# Patient Record
Sex: Female | Born: 1950
Health system: Southern US, Community
[De-identification: ages and names within clinical notes are randomized; demographics above are authoritative.]

## PROBLEM LIST (undated history)

## (undated) ENCOUNTER — Emergency Department (HOSPITAL_COMMUNITY): Admission: EM | Payer: Medicare Other | Source: Home / Self Care

## (undated) DIAGNOSIS — T7840XA Allergy, unspecified, initial encounter: Secondary | ICD-10-CM

## (undated) DIAGNOSIS — Z8719 Personal history of other diseases of the digestive system: Secondary | ICD-10-CM

## (undated) DIAGNOSIS — H269 Unspecified cataract: Secondary | ICD-10-CM

## (undated) DIAGNOSIS — E039 Hypothyroidism, unspecified: Secondary | ICD-10-CM

## (undated) DIAGNOSIS — F419 Anxiety disorder, unspecified: Secondary | ICD-10-CM

## (undated) DIAGNOSIS — F319 Bipolar disorder, unspecified: Secondary | ICD-10-CM

## (undated) DIAGNOSIS — G8929 Other chronic pain: Secondary | ICD-10-CM

## (undated) DIAGNOSIS — M549 Dorsalgia, unspecified: Secondary | ICD-10-CM

## (undated) HISTORY — DX: Unspecified cataract: H26.9

## (undated) HISTORY — PX: OTHER SURGICAL HISTORY: SHX169

## (undated) HISTORY — DX: Allergy, unspecified, initial encounter: T78.40XA

## (undated) HISTORY — PX: JOINT REPLACEMENT: SHX530

## (undated) HISTORY — PX: TONSILLECTOMY: SUR1361

---

## 2002-07-03 ENCOUNTER — Other Ambulatory Visit: Admission: RE | Admit: 2002-07-03 | Discharge: 2002-07-03 | Payer: Self-pay | Admitting: Obstetrics and Gynecology

## 2002-08-30 ENCOUNTER — Emergency Department (HOSPITAL_COMMUNITY): Admission: EM | Admit: 2002-08-30 | Discharge: 2002-08-30 | Payer: Self-pay | Admitting: *Deleted

## 2002-08-30 ENCOUNTER — Encounter: Payer: Self-pay | Admitting: Emergency Medicine

## 2002-11-21 ENCOUNTER — Ambulatory Visit (HOSPITAL_COMMUNITY): Admission: RE | Admit: 2002-11-21 | Discharge: 2002-11-21 | Payer: Self-pay | Admitting: Internal Medicine

## 2002-11-21 ENCOUNTER — Encounter: Payer: Self-pay | Admitting: Internal Medicine

## 2003-01-09 ENCOUNTER — Emergency Department (HOSPITAL_COMMUNITY): Admission: EM | Admit: 2003-01-09 | Discharge: 2003-01-10 | Payer: Self-pay | Admitting: *Deleted

## 2003-01-17 ENCOUNTER — Inpatient Hospital Stay (HOSPITAL_COMMUNITY): Admission: EM | Admit: 2003-01-17 | Discharge: 2003-01-25 | Payer: Self-pay | Admitting: Psychiatry

## 2003-02-03 ENCOUNTER — Emergency Department (HOSPITAL_COMMUNITY): Admission: EM | Admit: 2003-02-03 | Discharge: 2003-02-03 | Payer: Self-pay | Admitting: Emergency Medicine

## 2003-04-05 ENCOUNTER — Encounter (INDEPENDENT_AMBULATORY_CARE_PROVIDER_SITE_OTHER): Payer: Self-pay | Admitting: Specialist

## 2003-04-05 ENCOUNTER — Ambulatory Visit (HOSPITAL_COMMUNITY): Admission: RE | Admit: 2003-04-05 | Discharge: 2003-04-05 | Payer: Self-pay | Admitting: Gastroenterology

## 2003-04-08 ENCOUNTER — Encounter: Admission: RE | Admit: 2003-04-08 | Discharge: 2003-04-08 | Payer: Self-pay | Admitting: Internal Medicine

## 2003-04-08 ENCOUNTER — Encounter: Payer: Self-pay | Admitting: Internal Medicine

## 2003-07-05 ENCOUNTER — Other Ambulatory Visit: Admission: RE | Admit: 2003-07-05 | Discharge: 2003-07-05 | Payer: Self-pay | Admitting: Obstetrics and Gynecology

## 2003-12-04 ENCOUNTER — Encounter: Admission: RE | Admit: 2003-12-04 | Discharge: 2003-12-04 | Payer: Self-pay | Admitting: Internal Medicine

## 2003-12-05 ENCOUNTER — Emergency Department (HOSPITAL_COMMUNITY): Admission: EM | Admit: 2003-12-05 | Discharge: 2003-12-05 | Payer: Self-pay | Admitting: Emergency Medicine

## 2004-07-07 ENCOUNTER — Other Ambulatory Visit: Admission: RE | Admit: 2004-07-07 | Discharge: 2004-07-07 | Payer: Self-pay | Admitting: Obstetrics and Gynecology

## 2004-07-10 ENCOUNTER — Ambulatory Visit (HOSPITAL_COMMUNITY): Admission: RE | Admit: 2004-07-10 | Discharge: 2004-07-10 | Payer: Self-pay | Admitting: Obstetrics and Gynecology

## 2005-01-20 ENCOUNTER — Ambulatory Visit (HOSPITAL_BASED_OUTPATIENT_CLINIC_OR_DEPARTMENT_OTHER): Admission: RE | Admit: 2005-01-20 | Discharge: 2005-01-20 | Payer: Self-pay | Admitting: Orthopedic Surgery

## 2005-01-20 ENCOUNTER — Ambulatory Visit (HOSPITAL_COMMUNITY): Admission: RE | Admit: 2005-01-20 | Discharge: 2005-01-20 | Payer: Self-pay | Admitting: Orthopedic Surgery

## 2005-02-18 ENCOUNTER — Encounter: Admission: RE | Admit: 2005-02-18 | Discharge: 2005-02-18 | Payer: Self-pay | Admitting: Internal Medicine

## 2005-07-09 ENCOUNTER — Other Ambulatory Visit: Admission: RE | Admit: 2005-07-09 | Discharge: 2005-07-09 | Payer: Self-pay | Admitting: Obstetrics and Gynecology

## 2005-07-20 ENCOUNTER — Ambulatory Visit (HOSPITAL_COMMUNITY): Admission: RE | Admit: 2005-07-20 | Discharge: 2005-07-20 | Payer: Self-pay | Admitting: Obstetrics and Gynecology

## 2006-04-20 ENCOUNTER — Emergency Department (HOSPITAL_COMMUNITY): Admission: EM | Admit: 2006-04-20 | Discharge: 2006-04-20 | Payer: Self-pay | Admitting: Emergency Medicine

## 2006-06-20 ENCOUNTER — Inpatient Hospital Stay (HOSPITAL_COMMUNITY): Admission: RE | Admit: 2006-06-20 | Discharge: 2006-06-25 | Payer: Self-pay | Admitting: Orthopedic Surgery

## 2006-06-21 ENCOUNTER — Ambulatory Visit: Payer: Self-pay | Admitting: Physical Medicine & Rehabilitation

## 2006-08-24 ENCOUNTER — Other Ambulatory Visit: Admission: RE | Admit: 2006-08-24 | Discharge: 2006-08-24 | Payer: Self-pay | Admitting: Obstetrics and Gynecology

## 2006-09-02 ENCOUNTER — Ambulatory Visit (HOSPITAL_COMMUNITY): Admission: RE | Admit: 2006-09-02 | Discharge: 2006-09-02 | Payer: Self-pay | Admitting: Obstetrics and Gynecology

## 2007-06-28 ENCOUNTER — Inpatient Hospital Stay (HOSPITAL_COMMUNITY): Admission: RE | Admit: 2007-06-28 | Discharge: 2007-07-02 | Payer: Self-pay | Admitting: Orthopedic Surgery

## 2007-07-28 ENCOUNTER — Ambulatory Visit (HOSPITAL_COMMUNITY): Admission: RE | Admit: 2007-07-28 | Discharge: 2007-07-28 | Payer: Self-pay | Admitting: Orthopedic Surgery

## 2007-07-28 ENCOUNTER — Ambulatory Visit: Payer: Self-pay | Admitting: Vascular Surgery

## 2007-07-28 ENCOUNTER — Encounter (INDEPENDENT_AMBULATORY_CARE_PROVIDER_SITE_OTHER): Payer: Self-pay | Admitting: Orthopedic Surgery

## 2007-11-07 ENCOUNTER — Ambulatory Visit (HOSPITAL_COMMUNITY): Admission: RE | Admit: 2007-11-07 | Discharge: 2007-11-07 | Payer: Self-pay | Admitting: Obstetrics and Gynecology

## 2008-08-17 ENCOUNTER — Encounter: Admission: RE | Admit: 2008-08-17 | Discharge: 2008-08-17 | Payer: Self-pay | Admitting: Orthopedic Surgery

## 2008-10-28 ENCOUNTER — Inpatient Hospital Stay (HOSPITAL_COMMUNITY): Admission: AD | Admit: 2008-10-28 | Discharge: 2008-11-01 | Payer: Self-pay | Admitting: Orthopedic Surgery

## 2008-11-29 ENCOUNTER — Ambulatory Visit (HOSPITAL_COMMUNITY): Admission: RE | Admit: 2008-11-29 | Discharge: 2008-11-29 | Payer: Self-pay | Admitting: Internal Medicine

## 2009-03-20 ENCOUNTER — Ambulatory Visit (HOSPITAL_COMMUNITY): Admission: RE | Admit: 2009-03-20 | Discharge: 2009-03-20 | Payer: Self-pay | Admitting: Family Medicine

## 2009-03-26 ENCOUNTER — Emergency Department (HOSPITAL_COMMUNITY): Admission: EM | Admit: 2009-03-26 | Discharge: 2009-03-26 | Payer: Self-pay | Admitting: Emergency Medicine

## 2009-06-19 ENCOUNTER — Emergency Department (HOSPITAL_COMMUNITY): Admission: EM | Admit: 2009-06-19 | Discharge: 2009-06-19 | Payer: Self-pay | Admitting: Internal Medicine

## 2009-06-28 ENCOUNTER — Emergency Department (HOSPITAL_COMMUNITY): Admission: EM | Admit: 2009-06-28 | Discharge: 2009-06-28 | Payer: Self-pay | Admitting: Emergency Medicine

## 2009-07-01 ENCOUNTER — Other Ambulatory Visit: Payer: Self-pay | Admitting: Emergency Medicine

## 2009-07-02 ENCOUNTER — Ambulatory Visit: Payer: Self-pay | Admitting: Psychiatry

## 2009-07-02 ENCOUNTER — Other Ambulatory Visit: Payer: Self-pay | Admitting: Emergency Medicine

## 2009-07-02 ENCOUNTER — Inpatient Hospital Stay (HOSPITAL_COMMUNITY): Admission: AD | Admit: 2009-07-02 | Discharge: 2009-07-14 | Payer: Self-pay | Admitting: Psychiatry

## 2009-07-15 ENCOUNTER — Emergency Department (HOSPITAL_COMMUNITY): Admission: EM | Admit: 2009-07-15 | Discharge: 2009-07-15 | Payer: Self-pay | Admitting: Emergency Medicine

## 2009-09-08 ENCOUNTER — Encounter: Admission: RE | Admit: 2009-09-08 | Discharge: 2009-09-08 | Payer: Self-pay | Admitting: Internal Medicine

## 2009-11-11 ENCOUNTER — Ambulatory Visit (HOSPITAL_COMMUNITY): Admission: RE | Admit: 2009-11-11 | Discharge: 2009-11-11 | Payer: Self-pay | Admitting: Orthopedic Surgery

## 2010-02-09 ENCOUNTER — Ambulatory Visit (HOSPITAL_COMMUNITY): Admission: RE | Admit: 2010-02-09 | Discharge: 2010-02-09 | Payer: Self-pay | Admitting: Obstetrics and Gynecology

## 2010-07-26 ENCOUNTER — Emergency Department (HOSPITAL_COMMUNITY): Admission: EM | Admit: 2010-07-26 | Discharge: 2010-07-27 | Payer: Self-pay | Admitting: Emergency Medicine

## 2010-12-03 ENCOUNTER — Emergency Department (HOSPITAL_COMMUNITY)
Admission: EM | Admit: 2010-12-03 | Discharge: 2010-12-03 | Payer: Self-pay | Source: Home / Self Care | Admitting: Emergency Medicine

## 2010-12-07 LAB — URINALYSIS, ROUTINE W REFLEX MICROSCOPIC
Bilirubin Urine: NEGATIVE
Hgb urine dipstick: NEGATIVE
Ketones, ur: NEGATIVE mg/dL
Nitrite: NEGATIVE
Protein, ur: NEGATIVE mg/dL
Specific Gravity, Urine: 1.004 — ABNORMAL LOW (ref 1.005–1.030)
Urine Glucose, Fasting: NEGATIVE mg/dL
Urobilinogen, UA: 0.2 mg/dL (ref 0.0–1.0)
pH: 7.5 (ref 5.0–8.0)

## 2010-12-07 LAB — DIFFERENTIAL
Basophils Absolute: 0 10*3/uL (ref 0.0–0.1)
Basophils Relative: 0 % (ref 0–1)
Eosinophils Absolute: 0.2 10*3/uL (ref 0.0–0.7)
Eosinophils Relative: 3 % (ref 0–5)
Lymphocytes Relative: 19 % (ref 12–46)
Lymphs Abs: 1.3 10*3/uL (ref 0.7–4.0)
Monocytes Absolute: 0.7 10*3/uL (ref 0.1–1.0)
Monocytes Relative: 10 % (ref 3–12)
Neutro Abs: 4.7 10*3/uL (ref 1.7–7.7)
Neutrophils Relative %: 68 % (ref 43–77)

## 2010-12-07 LAB — CBC
HCT: 40.2 % (ref 36.0–46.0)
Hemoglobin: 12.8 g/dL (ref 12.0–15.0)
MCH: 26.9 pg (ref 26.0–34.0)
MCHC: 31.8 g/dL (ref 30.0–36.0)
MCV: 84.6 fL (ref 78.0–100.0)
Platelets: 324 10*3/uL (ref 150–400)
RBC: 4.75 MIL/uL (ref 3.87–5.11)
RDW: 14.9 % (ref 11.5–15.5)
WBC: 6.9 10*3/uL (ref 4.0–10.5)

## 2010-12-07 LAB — BASIC METABOLIC PANEL
BUN: 7 mg/dL (ref 6–23)
CO2: 29 mEq/L (ref 19–32)
Calcium: 10.2 mg/dL (ref 8.4–10.5)
Chloride: 101 mEq/L (ref 96–112)
Creatinine, Ser: 1.03 mg/dL (ref 0.4–1.2)
GFR calc Af Amer: >60 mL/min (ref >60)
GFR calc non Af Amer: 55 mL/min — ABNORMAL LOW (ref >60)
Glucose, Bld: 87 mg/dL (ref 70–99)
Potassium: 3.8 mEq/L (ref 3.5–5.1)
Sodium: 137 mEq/L (ref 135–145)

## 2010-12-13 ENCOUNTER — Encounter: Payer: Self-pay | Admitting: Internal Medicine

## 2010-12-13 ENCOUNTER — Encounter: Payer: Self-pay | Admitting: Family Medicine

## 2011-01-07 ENCOUNTER — Other Ambulatory Visit (HOSPITAL_COMMUNITY): Payer: Self-pay | Admitting: Obstetrics and Gynecology

## 2011-01-07 DIAGNOSIS — Z1231 Encounter for screening mammogram for malignant neoplasm of breast: Secondary | ICD-10-CM

## 2011-02-04 LAB — DIFFERENTIAL
Basophils Absolute: 0 10*3/uL (ref 0.0–0.1)
Basophils Relative: 0 % (ref 0–1)
Lymphocytes Relative: 21 % (ref 12–46)
Neutro Abs: 3.9 10*3/uL (ref 1.7–7.7)
Neutrophils Relative %: 65 % (ref 43–77)

## 2011-02-04 LAB — CBC
HCT: 38.5 % (ref 36.0–46.0)
MCH: 26.8 pg (ref 26.0–34.0)
MCV: 81.2 fL (ref 78.0–100.0)
Platelets: 283 10*3/uL (ref 150–400)
RBC: 4.74 MIL/uL (ref 3.87–5.11)

## 2011-02-04 LAB — COMPREHENSIVE METABOLIC PANEL
Albumin: 4.1 g/dL (ref 3.5–5.2)
BUN: 14 mg/dL (ref 6–23)
CO2: 28 mEq/L (ref 19–32)
Chloride: 100 mEq/L (ref 96–112)
Creatinine, Ser: 0.91 mg/dL (ref 0.4–1.2)
GFR calc non Af Amer: >60 mL/min (ref >60)
Glucose, Bld: 102 mg/dL — ABNORMAL HIGH (ref 70–99)
Total Bilirubin: 0.6 mg/dL (ref 0.3–1.2)

## 2011-02-04 LAB — URINALYSIS, ROUTINE W REFLEX MICROSCOPIC
Bilirubin Urine: NEGATIVE
Hgb urine dipstick: NEGATIVE
Specific Gravity, Urine: 1.006 (ref 1.005–1.030)
Urobilinogen, UA: 0.2 mg/dL (ref 0.0–1.0)

## 2011-02-11 ENCOUNTER — Ambulatory Visit (HOSPITAL_COMMUNITY): Payer: Medicare Other

## 2011-02-12 ENCOUNTER — Ambulatory Visit: Payer: Self-pay | Admitting: Physical Medicine & Rehabilitation

## 2011-02-18 ENCOUNTER — Ambulatory Visit (HOSPITAL_COMMUNITY): Payer: Medicare Other

## 2011-02-19 ENCOUNTER — Ambulatory Visit (HOSPITAL_COMMUNITY)
Admission: RE | Admit: 2011-02-19 | Discharge: 2011-02-19 | Disposition: A | Discharge status: Home / Self Care | Payer: Medicare Other | Source: Ambulatory Visit | Attending: Obstetrics and Gynecology | Admitting: Obstetrics and Gynecology

## 2011-02-19 DIAGNOSIS — Z1231 Encounter for screening mammogram for malignant neoplasm of breast: Secondary | ICD-10-CM

## 2011-02-27 LAB — BASIC METABOLIC PANEL
BUN: 10 mg/dL (ref 6–23)
CO2: 27 mEq/L (ref 19–32)
Calcium: 10.6 mg/dL — ABNORMAL HIGH (ref 8.4–10.5)
Chloride: 105 mEq/L (ref 96–112)
Creatinine, Ser: 0.96 mg/dL (ref 0.4–1.2)
GFR calc Af Amer: >60 mL/min (ref >60)

## 2011-02-27 LAB — CBC
HCT: 41.3 % (ref 36.0–46.0)
Hemoglobin: 13.5 g/dL (ref 12.0–15.0)
WBC: 5 10*3/uL (ref 4.0–10.5)

## 2011-02-27 LAB — DIFFERENTIAL
Basophils Absolute: 0 10*3/uL (ref 0.0–0.1)
Basophils Relative: 0 % (ref 0–1)
Eosinophils Absolute: 0.1 10*3/uL (ref 0.0–0.7)
Monocytes Absolute: 0.5 10*3/uL (ref 0.1–1.0)
Monocytes Relative: 11 % (ref 3–12)
Neutro Abs: 3.2 10*3/uL (ref 1.7–7.7)
Neutrophils Relative %: 63 % (ref 43–77)

## 2011-02-27 LAB — COMPREHENSIVE METABOLIC PANEL
ALT: 46 U/L — ABNORMAL HIGH (ref 0–35)
Albumin: 4.7 g/dL (ref 3.5–5.2)
Alkaline Phosphatase: 56 U/L (ref 39–117)
BUN: 4 mg/dL — ABNORMAL LOW (ref 6–23)
Chloride: 98 mEq/L (ref 96–112)
Glucose, Bld: 75 mg/dL (ref 70–99)
Potassium: 2.9 mEq/L — ABNORMAL LOW (ref 3.5–5.1)
Sodium: 132 mEq/L — ABNORMAL LOW (ref 135–145)
Total Bilirubin: 0.8 mg/dL (ref 0.3–1.2)

## 2011-02-27 LAB — TSH: TSH: 99.784 u[IU]/mL — ABNORMAL HIGH (ref 0.350–4.500)

## 2011-02-28 LAB — RAPID URINE DRUG SCREEN, HOSP PERFORMED
Amphetamines: NOT DETECTED
Barbiturates: NOT DETECTED
Benzodiazepines: NOT DETECTED
Cocaine: NOT DETECTED
Opiates: NOT DETECTED
Tetrahydrocannabinol: NOT DETECTED

## 2011-02-28 LAB — BASIC METABOLIC PANEL
BUN: 8 mg/dL (ref 6–23)
CO2: 26 mEq/L (ref 19–32)
Calcium: 10.9 mg/dL — ABNORMAL HIGH (ref 8.4–10.5)
Chloride: 105 mEq/L (ref 96–112)
Creatinine, Ser: 0.83 mg/dL (ref 0.4–1.2)
GFR calc Af Amer: >60 mL/min (ref >60)
Glucose, Bld: 83 mg/dL (ref 70–99)

## 2011-02-28 LAB — URINALYSIS, ROUTINE W REFLEX MICROSCOPIC
Bilirubin Urine: NEGATIVE
Glucose, UA: NEGATIVE mg/dL
Hgb urine dipstick: NEGATIVE
Specific Gravity, Urine: 1.006 (ref 1.005–1.030)
pH: 7 (ref 5.0–8.0)

## 2011-02-28 LAB — CBC
MCHC: 32.3 g/dL (ref 30.0–36.0)
MCV: 80.9 fL (ref 78.0–100.0)
RBC: 4.94 MIL/uL (ref 3.87–5.11)
RDW: 15.8 % — ABNORMAL HIGH (ref 11.5–15.5)

## 2011-02-28 LAB — WET PREP, GENITAL

## 2011-02-28 LAB — LITHIUM LEVEL: Lithium Lvl: 1.25 mEq/L (ref 0.80–1.40)

## 2011-03-03 ENCOUNTER — Encounter: Payer: Medicare Other | Attending: Physical Medicine & Rehabilitation | Admitting: Physical Medicine & Rehabilitation

## 2011-03-03 DIAGNOSIS — M1710 Unilateral primary osteoarthritis, unspecified knee: Secondary | ICD-10-CM

## 2011-03-03 DIAGNOSIS — E669 Obesity, unspecified: Secondary | ICD-10-CM

## 2011-03-03 DIAGNOSIS — Z79899 Other long term (current) drug therapy: Secondary | ICD-10-CM | POA: Insufficient documentation

## 2011-03-03 DIAGNOSIS — Z96659 Presence of unspecified artificial knee joint: Secondary | ICD-10-CM | POA: Insufficient documentation

## 2011-03-03 DIAGNOSIS — M25569 Pain in unspecified knee: Secondary | ICD-10-CM | POA: Insufficient documentation

## 2011-03-03 DIAGNOSIS — F319 Bipolar disorder, unspecified: Secondary | ICD-10-CM | POA: Insufficient documentation

## 2011-03-03 DIAGNOSIS — E039 Hypothyroidism, unspecified: Secondary | ICD-10-CM | POA: Insufficient documentation

## 2011-03-16 ENCOUNTER — Encounter: Payer: Medicare Other | Attending: Neurosurgery | Admitting: Neurosurgery

## 2011-03-16 DIAGNOSIS — Z96659 Presence of unspecified artificial knee joint: Secondary | ICD-10-CM | POA: Insufficient documentation

## 2011-03-16 DIAGNOSIS — Z79899 Other long term (current) drug therapy: Secondary | ICD-10-CM | POA: Insufficient documentation

## 2011-03-16 DIAGNOSIS — M25569 Pain in unspecified knee: Secondary | ICD-10-CM

## 2011-03-16 DIAGNOSIS — F319 Bipolar disorder, unspecified: Secondary | ICD-10-CM | POA: Insufficient documentation

## 2011-03-16 DIAGNOSIS — E039 Hypothyroidism, unspecified: Secondary | ICD-10-CM | POA: Insufficient documentation

## 2011-03-16 DIAGNOSIS — M1990 Unspecified osteoarthritis, unspecified site: Secondary | ICD-10-CM | POA: Insufficient documentation

## 2011-03-16 DIAGNOSIS — M1710 Unilateral primary osteoarthritis, unspecified knee: Secondary | ICD-10-CM

## 2011-03-16 DIAGNOSIS — G8929 Other chronic pain: Secondary | ICD-10-CM | POA: Insufficient documentation

## 2011-03-16 NOTE — Assessment & Plan Note (Signed)
This is a patient who was seen as a new patient by Dr. Riley Kill last month.  She had extensive amount of knee pain.  She has got a history of bilateral knee replacements.  She is followed by Dr. Dorothyann Peng.  She also has a history of bipolar disorder treated with Zyprexa, lithium, and Klonopin, and she comes in today stating she has mixed up her lithium with her Mobic, and I had show how to separate those.  I have encouraged her not to mix her medicines.  She states she do not know why she did that.  She has trouble from a sitting position to standing.  She does use a rolling walker.  She states that her legs feel very stiff or sore with ambulation.  PAST MEDICAL HISTORY:  Bipolar osteoarthritis, hypothyroidism.  REVIEW OF SYSTEMS:  Notable for those difficulties described in history of present illness, past medical history.  Review of systems is otherwise unremarkable.  SOCIAL HISTORY:  Unchanged  FAMILY HISTORY:  Unchanged.  MEDICATIONS: 1. Synthroid 88 mcg daily. 2. Zyprexa 10 daily. 3. Pravachol 20 daily. 4. Baclofen 10 daily. 5. We have added Mobic 15 mg a day, which she states is not helping     her lot. 6. I have started her on tramadol a day 50 mg 1 p.o. b.i.d.  PHYSICAL EXAMINATION:  Blood pressure 104/66, pulse 73, respirations 18, O2 sats 95 on room air.  Motor strength is 5/5 in lower extremities. Sensation appears to be intact in lower extremities, although she has decreased range of motion.  She is alert and oriented x3.  However, she does slur her speech somewhat.  She appears little bit depressed. Constitutionally, she appears within normal limits.  She is obese.  ASSESSMENT:  History of bipolar disorder with bilateral osteoarthritis of knees with status post knee arthroplasties and chronic knee pain.  PLAN:  We will continue her on her Mobic, start her on tramadol 50 mg 1 p.o. b.i.d.  We will order some labs for her, TSH, BMET, and a lithium level.  She  will follow up with Dr. Riley Kill in 2-3 weeks, start on physical therapy 2-3 times a week for range of motion in her lower extremities.  She is in agreement with this.     [facility]    RLW/MedQ D:  03/16/2011 10:20:10  T:  03/16/2011 22:48:50  Job #:  [id number]

## 2011-04-01 ENCOUNTER — Ambulatory Visit: Payer: Medicare Other | Admitting: Neurosurgery

## 2011-04-06 ENCOUNTER — Encounter: Payer: Medicare Other | Attending: Neurosurgery | Admitting: Neurosurgery

## 2011-04-06 DIAGNOSIS — Z79899 Other long term (current) drug therapy: Secondary | ICD-10-CM | POA: Insufficient documentation

## 2011-04-06 DIAGNOSIS — G8929 Other chronic pain: Secondary | ICD-10-CM | POA: Insufficient documentation

## 2011-04-06 DIAGNOSIS — M1710 Unilateral primary osteoarthritis, unspecified knee: Secondary | ICD-10-CM

## 2011-04-06 DIAGNOSIS — Z96659 Presence of unspecified artificial knee joint: Secondary | ICD-10-CM | POA: Insufficient documentation

## 2011-04-06 DIAGNOSIS — M25569 Pain in unspecified knee: Secondary | ICD-10-CM

## 2011-04-06 DIAGNOSIS — F319 Bipolar disorder, unspecified: Secondary | ICD-10-CM | POA: Insufficient documentation

## 2011-04-06 NOTE — H&P (Signed)
Brenda [facility], [facility] NO.:  [id number]   MEDICAL RECORD NO.:  [id number]          PATIENT TYPE:  IPS   LOCATION:  0406                          [facility]ACILITY[facility]  [facility]   PHYSICIAN:  Anselm Jungling, MD  DATE OF BIRTH:  1951-09-27   DATE OF ADMISSION:  07/02/2009  DATE OF DISCHARGE:                       PSYCHIATRIC ADMISSION ASSESSMENT   DATE OF ASSESSMENT:  July 02, 2009 at 11:45 a.m.   IDENTIFYING INFORMATION:  A 60 year old female.  This is an involuntary  admission.   HISTORY OF PRESENT ILLNESS:  This is the second [facility] admission for this  60 year old who presented in the emergency room accompanied by her  family after several days of confusion.  Family complaining that she is  not herself, wandering at night, unable to sleep, calling them at all  hours.  Generally not herself.  Her initial lithium level was noted to  be 1.98, and on repeat was 1.25.  Electrolytes and kidney function were  normal.  She was in no physical distress.  She had presented previously  in the emergency room on June 28, 2009, also for altered mental status  at that time, and was found to be hyponatremic and hypokalemic with a  potassium of 2.9.  Ammonia level at that time was normal, and lithium  level 1.07.  CT scan showed mild periventricular white matter changes  but no acute abnormalities.  Also seen in the emergency room on June 19, 2009, in full contact with reality but complaining of right leg pain  after knee replacements and was prescribed Percocet for the pain.  Today, she has a blunt affect, poor eye contact.  She is disheveled,  hair and clothing in disarray.  Knows who she is, knows the day of the  week, the month of the year and believes that she is on a rehab unit.  Displaying some thought blocking and complaining of a lot of voices  around her.  Expressing no suicidal or homicidal thoughts or any  dangerous ideas.  Her son reported that she had a period within the past  month of 2 weeks when she was without any of her medications because she  had missed an earlier appointment with [facility], and prior to  admission had been back on her medications for 4-5 days.   PAST PSYCHIATRIC HISTORY:  She has a history of bipolar disorder and is  followed as an outpatient [facility].  Previous [facility] admission January 17, 2003 to January 25, 2003,  treated for agitation which had gotten worse after starting on  Wellbutrin.  Discharged at that time on lithium carbonate 300 mg t.i.d.,  Klonopin 0.5 mg twice a day, Zyprexa Zydis 20 mg at night and Wellbutrin  was discontinued.  She has a history of long periods of stability in her  bipolar disorder.   SOCIAL HISTORY:  A single female who lives alone in her own residence,  previously able to live independently and tend to her own ADLs.  Family  is nearby and is supportive.   FAMILY HISTORY:  Not  available.   ALCOHOL AND DRUG HISTORY:  No known history of substance abuse.   MEDICAL HISTORY:  Primary care [facility] is not available.   MEDICAL PROBLEMS:  1. Arthritis NOS.  2. She is status post bilateral total knee replacement.  3. Hypokalemia, repleted.  4. Transient lithium toxicity.  5. Mild elevated transaminases.   CURRENT MEDICATIONS:  1. Lithium carbonate 600 mg twice daily.  2. Percocet 5/325 one tablet p.o. q.6h. p.r.n. for arthritis pain.  3. Clonazepam 0.5 mg 3 tablets in the morning and 2 tablets at night.  4. Zyprexa 10 mg once daily.   DRUG ALLERGIES:  None.   PHYSICAL EXAMINATION:  Physical exam done in the emergency room as noted  in the record.   LABORATORY DATA:  Lithium levels as noted above.  Her urinalysis that  was done on the 10th was within normal limits.  Urine drug screen on the  10th was noted to be negative for opiates and benzodiazepines and all  other substances.  Chemistry - sodium 139, potassium 3.9, chloride 105,  carbon dioxide 27, BUN 10,  creatinine 0.96 and random glucose 98.  Liver  enzymes were done on the June 28, 2009.  SGOT 65, SGPT 46, alkaline  phosphatase 56 and total bilirubin 0.8.   MENTAL STATUS EXAM:  Fully alert female, sitting on the edge of the bed.  Clothing is askew, disheveled, hair in disarray, blunt affect.  No eye  contact.  Knows who she is.  Knows that it is Thursday.  Believes she is  on the rehab unit.  Complaining that there are a lot of voices around  her.  Complaining of some right hip pain for which the nurses are  getting her pain medication.  Speech soft in tone, barely audible at  times.  Some response latency.  Mood anxious.  Thought process reflects  some thought blocking, possible auditory hallucinations.  No dangerous  ideas.  Possible visual hallucinations.  Does appear to be a little bit  distracted but is cooperative and directable.   IMPRESSION:  AXIS I:  Psychosis NOS, rule out delirium.  Bipolar  disorder NOS.  AXIS II:  Deferred.  AXIS III:  Mild elevated transaminases, arthritis NOS, history of hip  replacement, hypokalemia repleted.  AXIS IV:  Deferred.  AXIS V:  Current 34; past year not known.   PLAN:  The plan is to involuntarily admit her to our intensive care  unit.  She has a one-to-one aide with her to assist her with her ADLs  and for safety reasons.  She continues to be a bit confused with poor  judgment, poor motor planning.  We are continuing her lithium at 300 mg  b.i.d., and her lithium level today is 0.86.  Her electrolytes are  normal this morning.  Will be in touch with the family, and I will get  some input from them.  We are also continuing her Klonopin.      Margaret A. Lorin Picket, N.P.      Anselm Jungling, MD  Electronically Signed    MAS/MEDQ  D:  07/03/2009  T:  07/03/2009  Job:  [id number]

## 2011-04-06 NOTE — Discharge Summary (Signed)
B[facility]da [facility], [facility] NO.:  [id number]   MEDICAL RECORD NO.:  [id number]          PATIENT TYPE:  INP   LOCATION:  5020                         FACILITY:  [facility]   PHYSICIAN:  Harvie Junior, M.D.   DATE OF BIRTH:  09/08/1951   DATE OF ADMISSION:  06/28/2007  DATE OF DISCHARGE:  07/02/2007                               DISCHARGE SUMMARY   FINAL DIAGNOSES:  1. End stage degenerative joint disease, left knee.  2. Bipolar disorder.  3. Postoperative blood loss anemia, stable.   PROCEDURES:  June 28, 2007, total left knee arthroplasty.  Surgeon, Dr.  Luiz Blare.   HISTORY:  This is a 60 year old [place] American female who is followed  by Dr. Luiz Blare.  She follows for knee problems. She is status post a  total right knee in the past.  She now has bone-on-bone on her left knee  and is ready for surgery.  Subsequently she was scheduled for surgery.   HOSPITAL COURSE:  The patient was admitted to [facility] on  June 28, 2007.  At that time she underwent a left total knee  arthroplasty.  The patient tolerated the procedure well.  No  intraoperative complications.  Postoperatively the patient's course  without any untoward events. She did have some mild postoperative blood  loss anemia but she did not need to receive any blood products for this.  Her left knee incision healed satisfactory.  At the time of discharge  the staples were replaced and there was no sign of infection.  No  erythema.  Peripheral pulses were intact.  Neuro was grossly intact.  She continued with physical therapy, CPM machine while she was in the  hospital.  She did well.  She was out of bed without assistance by the  third postoperative day.  On the fourth postoperative day she was ready  for discharge.   At the time of discharge, her medications are:  1. Zyprexa 15 mg by mouth at bedtime.  2. __________  0.5 mg by mouth 3 times a day and at bedtime.  3. Lithium carbonate 300 mg 2 a.m.  and 2 p.m.  4. Percocet 1 or 2 by mouth every 4 to 6 hours as needed for pain.  5. She will continue on Coumadin.  At the time of discharge Coumadin      INR was 2.1 with a PT of 24.2.  She will be continued on 5 mg of      Coumadin daily initially and will be followed by home health for PT      INRs twice a week.  Will continue with this for the next 30 days.   At the time of discharge the patient is doing well.  She will follow up  with Dr. Luiz Blare in approximately 2 weeks.  She will continue with home  health physical therapy.  She is discharged home in satisfactory and  stable condition on July 02, 2007.      Phineas Semen, P.A.      Harvie Junior, M.D.  Electronically Signed    CL/MEDQ  D:  07/02/2007  T:  07/02/2007  Job:  [id number]

## 2011-04-06 NOTE — Assessment & Plan Note (Signed)
B[facility] is a patient who is seen as a new patient by Dr. Riley Kill in early April.  She followed up with me on March 16, 2011.  She has had a history of bilateral knee replacements.  She has got chronic knee pain, history of bipolar.  She does tell me today that the tramadol that I attempted to start her on the last month, she has quit taking it.  She continues with her Zyprexa, lithium, and Klonopin.  She does take some Mobic from time to time.  She states her pain is unchanged.  She rates it about 10, it is sharp burning pain.  It is pretty constant.  She uses a rolling walker to get around.  She does not climb steps or drive.  Review of systems shows she has some problems with nausea as well as pain medicine intolerance and she also has psychiatric history of bipolar that are somewhat controlled on her current medications.  REVIEW OF SYSTEMS:  Notable for those difficulties described above, otherwise unremarkable.  SOCIAL HISTORY:  Unchanged.  She is widowed, lives alone.  FAMILY HISTORY:  Unchanged.  Medications are unchanged.  She is on Synthroid 80 mcg daily, Zyprexa 10 daily, Pravachol 20 daily, baclofen 10 daily, Mobic 15 mg a day, and not she discontinued her tramadol herself.  PHYSICAL EXAMINATION:  Her blood pressure 111/70, pulse 87, respirations 18, O2 sats 100 on room air.  She has crepitus bilaterally in her knees. She does not want to bend her knees very much due to pain.  She appears somewhat depressed.  Constitutionally she appears within normal limits. Her gait is somewhat altered using her walker.  ASSESSMENT: 1. Bipolar disorder. 2. Bilateral osteoarthritis of the knees with history of     arthroplasties. 3. Chronic knee pain.  PLAN: 1. She will continue her Mobic 15 mg daily. 2. She has discontinued her tramadol.  She has declined any further     pain medicines or patches. 3. She will follow up with Dr. Riley Kill in 1 month.  She is questioning  starting physical therapy.  She wants to do that, that was his     first recommendation. 4. She will follow up with her primary care doctor for followup     lithium level and thyroid panel and she understands to do this.  I     put her on a referral sheet.  Her questions were encouraged and     answered.     Zera Markwardt L. Blima Dessert Electronically Signed    RLW/MedQ D:  04/06/2011 09:56:45  T:  04/06/2011 22:06:46  Job #:  [id number]

## 2011-04-06 NOTE — Op Note (Signed)
NAMERAYVEN, RETTIG                ACCOUNT NO.:  [id number]   MEDICAL RECORD NO.:  [id number]          PATIENT TYPE:  INP   LOCATION:  2550                         FACILITY:  [facility]   PHYSICIAN:  Harvie Junior, M.D.   DATE OF BIRTH:  10/28/1951   DATE OF PROCEDURE:  06/28/2007  DATE OF DISCHARGE:                               OPERATIVE REPORT   PREOPERATIVE DIAGNOSIS:  End stage joint disease left knee.   POSTOPERATIVE DIAGNOSIS:  End stage joint disease left knee.   PRINCIPAL PROCEDURE:  Left total knee replacement with Sigma system as  outlined.  Computer assisted total knee replacement.  Size 2 femur, size  2 tibia, 10-mm bridging bearing a 32-mm all polyethylene patella.   SURGEON:  Harvie Junior, M.D.   ASSISTANT:  Marshia Ly, P.A.   ANESTHESIA:  General.   BRIEF HISTORY:  Ms. Skalicky is a 60 year old female with long history of  having severe degenerative bilateral disease in both knees.  She had the  right knee done and done well and ultimately came back for left total  knee replacement.  Because of her young age and need for longevity we  felt that computer assistance was appropriate.  This was chosen to be  done preoperatively.   PROCEDURE:  The patient is operating room.  After adequate anesthesia  was obtained with general anesthetic, the patient was placed supine on  operating table.  Left leg prepped and draped in the usual sterile  fashion.  Following this, leg was exsanguinated.  Blood pressure  tourniquet was inflated to 350 mmHg.  Following this, a midline incision  was made.  Subcutaneous tissue  dissected down to level of extensor  mechanism, medial parapatellar arthrotomy of knee undertaken.  The  medial and lateral menisci were removed as well as the anterior and  posterior cruciates as well as the retropatellar fat pad.  At this point  the computer modules were placed, two pins in the tibia, two pins in the  femur and registration process was  undertaken.  This was about 20  additional minutes in the case.  At this point we assessed the overall  long alignment and did a little more of a medial release to allow her to  come into neural long alignment.  At this point the tibia was cut  perpendicular to the long axis with computer assistance and the femur  was cut perpendicular to its long axis with computer assistance and then  spacer blocks were used to assess that there was an excellent 10 mm gap  in long alignment.  At this point attention was turned towards the  sizing and sized to a size 2 with 3 degrees external rotation was  chosen. This matched the computer perfectly and then anterior and  posterior cuts were made as well as a box cut being made.  Following  this, the trial two components were put in place.  Followed by the tibia  which the sizing was chosen and it was keeled and lugs were drilled.  Following this, the deep torpedo device was used to  drill a central PEG.  Following this, attention was turned towards the placement of the trial  tibial component with a 10 mm spacer and the trial femur and attention  was turned to the patella which was cut and sized to a 32 and the lugs  were drilled for patellar button and the patellar button was put in  place and a trial reduction was undertaken.  At this point she had  perfect neutral long alignment, perfect balancing in extension and  flexion and following this, all trial components were removed.  The knee  was copiously and thoroughly lavaged with normal saline pulse lavage  irrigation and the final components were cemented into place, size 2  tibia, size 2 femur, 10 mm bridging bearing, 32 mm all polyethylene  patella.  Once this was completed, attention was turned towards the  computer long alignment and again it was in perfect neutral long  alignment with perfect balance.  At this point a medium Hemovac drain  was placed.  The cement was allowed to harden and once it  was, the  tourniquet was let down.  The trial polyethylene was removed and all  bleeding was controlled with electrocautery and the final polyethylene  component was placed.  At this point the median parapatellar arthrotomy  was closed with 1-0 Vicryl running suture and the skin with 0-0 and 2-0  Vicryl and skin staples.  Sterile compressive dressing was applied as  well as knee immobilizer.  The patient was taken to the recovery room  where she was noted to be in satisfactory condition.  The estimated  blood loss for this procedure was less than 100 mL.      Harvie Junior, M.D.  Electronically Signed     JLG/MEDQ  D:  06/28/2007  T:  06/28/2007  Job:  [id number]

## 2011-04-09 NOTE — Discharge Summary (Signed)
B[facility]da [facility], Brenda [facility] NO.:  [id number]   MEDICAL RECORD NO.:  [id number]          PATIENT TYPE:  IPS   LOCATION:  0406                          [facility]ACILITY[facility]  [facility]   PHYSICIAN:  Anselm Jungling, MD  DATE OF BIRTH:  1951/01/08   DATE OF ADMISSION:  07/02/2009  DATE OF DISCHARGE:  07/14/2009                               DISCHARGE SUMMARY   IDENTIFYING DATA AND REASON FOR ADMISSION:  This was an inpatient  psychiatric admission for Brenda Murray, a 60 year old African American female  who was admitted due to increasing confusion.  Please refer to the  admission note for further details pertaining to the symptoms,  circumstances and history that led to her hospitalization.  She was  given an initial Axis I diagnosis of psychosis NOS, rule out delirium,  rule out bipolar disorder NOS.   MEDICAL AND LABORATORY:  The patient was medically and physically  assessed by the psychiatric nurse practitioner.  A CT scan showed mild  periventricular white matter changes, but no acute abnormalities.  She  had apparently been seen in the Emergency Department about 2 weeks  prior.  At which time, she was in full contact with reality, but  complaining of right leg pain following knee replacements.  She was  prescribed Percocet for the pain.  When she presented to our service,  she had blunt affect and poor eye contact.  She was disheveled, and her  clothing was in disarray.  She was poorly oriented.  She had some  thought blocking and complained of many voices around her.  There was no  suicidal ideation.  Her level of insight was questionable.  Her initial  lithium level was noted to be 1.98, and on repeat was 1.25.  Electrolytes and renal function appeared to be normal.  She was in no  physical distress.   The patient came to [place] with a history of generally good health.  Urinalysis was within normal limits.  There was a mild hepatic  transaminase, but no other abnormalities in liver  function tests.   The patient required one-to-one staffing throughout her stay because of  concerns about her level of confusion, and possible fall risk.   Because of the initial presentation of confusion and delirium, it was  felt important to minimize the load of CNS depressant medications that  she was receiving.  To this end, she was maintained on the lowest  effective doses of Zyprexa and Klonopin.  With this approach, she  gradually cleared, to the point that she was no longer experiencing  delirium or frank symptoms of psychosis.   Her family was involved closely throughout her stay, and they were  consulted with frequently with respect to their views on Brenda Murray's level  of progress, as compared to her normal level of functioning.   After 14 days of treatment, it appeared that she was close to her  baseline level of functioning, and appropriate for discharge.  She and  her family agreed to following aftercare plan.   AFTERCARE:  The patient was to follow up with [name], nurse  practitioner, at [facility] with an appointment on August 31 at  12:10 p.m.   DISCHARGE MEDICATIONS:  1. Synthroid 50 mcg daily.  2. Klonopin 2 mg q.h.s.  3. Zyprexa 15 mg q.h.s.   The patient was also instructed to follow up with the nurse practitioner  at Dr. Allyne Gee office on August 05, 2009 at 10 a.m.   DISCHARGE DIAGNOSES:  Axis I: Status post confusional state, status post  psychosis not otherwise specified.  Axis II: Deferred.  Axis III: History of hypothyroidism, status post knee replacement.  Axis IV: Stressors severe.  Axis V: Global assessment of functioning on discharge 45.      Anselm Jungling, MD  Electronically Signed     SPB/MEDQ  D:  07/16/2009  T:  07/16/2009  Job:  [id number]

## 2011-04-09 NOTE — H&P (Signed)
NAME:  Brenda Murray, Brenda Murray NO.:  [id number]   MEDICAL RECORD NO.:  [id number]                   PATIENT TYPE:  IPS   LOCATION:  0402                                 [facility]ACILITY:  [facility]   PHYSICIAN:  Geoffery Lyons, M.D.                   DATE OF BIRTH:  1951-03-06   DATE OF ADMISSION:  01/17/2003  DATE OF DISCHARGE:                         PSYCHIATRIC ADMISSION ASSESSMENT   DATE OF ASSESSMENT:  January 18, 2003   PATIENT IDENTIFICATION:  This is a 60 year old African-American female who  is widowed, voluntary admission.   HISTORY OF PRESENT ILLNESS:  This patient with a long history of bipolar  illness moved to [facility] with her son in October 2003 after being widowed  approximately one year ago when her husband died of cancer.  She has seen  [employer]. Ilsa Iha, M.D., since she moved here on October 2003.  Prior to that,  she was followed by a Dr. Allena Katz, her psychiatrist in [place], [place].  The patient's history today is primarily provided by her son.  The patient,  herself, is increasingly tearful and agitated.  Her son reports that her  symptoms have been markedly worse since starting on Wellbutrin XL.  The  patient was previously managed for a long time on Klonopin along with some  Ativan and her lithium, doses of which are unclear, along with her regular  medication of lithium 300 mg p.o. t.i.d.  Today, the patient is agitated and  tearful.  She reports that she has been hearing voices saying that they  were coming to get her.   PAST PSYCHIATRIC HISTORY:  The patient has been followed by Phyllis Ginger. Ilsa Iha,  M.D., since October 2003.  Prior to that, she was followed by Dr. Allena Katz in  [place], [place].  This is her first admission to [facility] with her last hospitalization in being in 2002 in [place]  when her husband was initially diagnosed with cancer.  Her son reports that  she has coped quite well with her husband's care  throughout his illness.  He  died approximately one year ago.  The patient continued to live on her own  in [place] at their home until she came to live with her son in October  2003.   SUBSTANCE ABUSE HISTORY:  Substance abuse history is negative.   PAST MEDICAL HISTORY:  The patient's primary care Ashwath Lasch is not clear.  Medical problems: The patient's son reports that she is healthy with no  regular medical problems.   MEDICATIONS:  1. Lithium 300 mg t.i.d. for a long period of time.  2. The patient was recently started Wellbutrin XL approximately a month or     so ago, which her son feels has exacerbated her paranoid, tearfulness,     and agitation.  3. The patient previously was taking Klonopin, although her son does not  know how much she was taking and things that was discontinued.  4. She was also taking some lorazepam along with it, which was also     discontinued.   DRUG ALLERGIES:  None.   PHYSICAL EXAMINATION:  GENERAL:  The patient's physical examination was done  in the emergency room and was essentially unremarkable.  VITAL SIGNS:  Her blood pressure was mildly elevated in the emergency room.  Her blood pressure on evaluation today was within normal limits.   LABORATORY DATA:  The patient's lithium level is noted at less than 0.25.  Her laboratory studies are otherwise within normal limits.  CBC is normal  with a hemoglobin 12.5, hematocrit 37.5, MCV 81, platelets 294,000.  Routine  chemistry reveals normal electrolytes, BUN 5, creatinine 1.5.  AST 18, ALT  17.  TSH normal at 2.253.  The patient's urine was noted as cloudy with  trace leukocyte esterase, wbc's were 0-2, bacteria few, epithelials few.   SOCIAL HISTORY:  This is a widowed African-American female who raised one  son named Demaris Callander, who lives here in [place].  The patient has no  prior history of substance abuse, no specific financial problems or other  stressors.  She is having some stress  from approximately one year  anniversary of her husband's death from cancer.   FAMILY HISTORY:  Family history is unremarkable.   MENTAL STATUS EXAM:  This is a confused and frightened appearing African-  American female with a hypervigilant and suspicious affect.  She is  agitated, shrieking in the hallway, shrieking that she is hearing voices and  that they are coming after her as she listens to the PA system.  She  believes the nurses and other staff are seeking to harm her.  An aid is at  the end of the hall putting some gloves on for a procedure with another  patient and the patient is convinced that she is coming after her to hurt  her.  Speech is pressured.  She is shrieking.  Mood is agitated, anxious,  and fearful.  Thought process is agitated, positive for paranoid delusions,  positive for auditory hallucinations, hearing voices from the PA system that  simply are not there.  Whether she is having visual hallucinations or not is  unclear.  She is responding to internal stimuli.  Cognitive: Intact and  oriented to person and place when we are able to question her and she does  know the day.   ADMISSION DIAGNOSES:   AXIS I:  1. Psychosis, not otherwise specified.  2. Bipolar I disorder by history.   AXIS II:  No diagnosis.   AXIS III:  No diagnosis.   AXIS IV:  Deferred.   AXIS V:  Current 14, past year 12.   INITIAL PLAN OF CARE:  Plan is to voluntarily admit the patient to alleviate  her psychosis and agitation.  We have elected to start her on one-to-one  observation for safety.  Her labs are done.  We are continue her lithium  since her previous compliance is unclear and we will recheck a lithium level  on her shortly.  We are going to discontinue her Wellbutrin at this time.  Meanwhile, we have elected to start her on Zyprexa Zydis 5 mg q.4h. p.r.n.  for any agitation.  She also can have Ativan 1 mg p.o. q.4h. for agitation. In fact, she has just now had a dose.   We will continue her one-to-one until  we are  convinced that she is able to appreciate her surroundings, that her  hallucinations are under control, and she can promise safety.  Meanwhile, we  will ask the case managers to recheck with her son about any other stressors  she may have.   ESTIMATED LENGTH OF STAY:  Five days.     Margaret A. Scott, N.P.                   Geoffery Lyons, M.D.    MAS/MEDQ  D:  01/21/2003  T:  01/21/2003  Job:  [id number]

## 2011-04-09 NOTE — Op Note (Signed)
NAME:  Brenda Murray, Brenda Murray                ACCOUNT NO.:  [id number]   MEDICAL RECORD NO.:  [id number]          PATIENT TYPE:  AMB   LOCATION:  DSC                          FACILITY:  [facility]   PHYSICIAN:  Harvie Junior, M.D.   DATE OF BIRTH:  1951-04-18   DATE OF PROCEDURE:  01/20/2005  DATE OF DISCHARGE:                                 OPERATIVE REPORT   PREOPERATIVE DIAGNOSIS:  Medial meniscal tear.   POSTOPERATIVE DIAGNOSIS:  1.  Medial meniscal tear.  2.  Chondromalacia of the patella.   PROCEDURE:  1.  Partial posterior horn medial meniscectomy with corresponding of medial      femoral condyle.  2.  Chondroplasty of the patellofemoral joint.   SURGEON:  Harvie Junior, M.D.   ASSISTANT:  Marshia Ly, P.A.   ANESTHESIA:  General.   BRIEF HISTORY:  [name] is a 60 year old female with a long history of  having had significant pain in both of her knees, her right knee was  bothering her  most recently most frequently.  She was evaluated by Dr.  Althea Charon in our office with MRI and found to have mid body medial meniscal  tear.  We talked about treatment options, though ultimately she failed  injection therapy.  So she was taken to the operating room for operative  knee arthroscopy.   DESCRIPTION OF PROCEDURE:  The patient was taken to the operating room and  after undergoing general anesthesia, the patient was placed on the operating  table, the right leg was prepped and draped in the usual sterile fashion.  Following this, routine arthroscopic examination of the knee revealed there  was an obvious mid body tear of the medial meniscal tear, it was a flapped  tear.  Following this, attention was turned towards the posterior horn which  was minimally debrided to ellipse out the torn portion of meniscus.  The  probe was used at this pointy and the remaining meniscus was stable.  There  was a small very poor area of degenerative change.  There were some grade 2  and 3 areas of  change.  The medial femoral condyle was then debrided to a  smooth and stable rim.  Attention was turned turned to the ACL which was  normal.  Attention was turned laterally which was normal.  Attention was  turned back up into the patellofemoral joint with some grade 2 and 3 changes  in the trochlea, grade 2 changes in the patella, midline patellar tracked.  At this point, the wound was copiously irrigated and suctioned dry.  The  patient was taken to the recovery room and was noted to be in satisfactory  condition.  Estimated blood loss none.    JLG/MEDQ  D:  01/20/2005  T:  01/20/2005  Job:  [id number]

## 2011-04-09 NOTE — Op Note (Signed)
NAME:  Brenda Murray, Brenda Murray                          ACCOUNT NO.:  [id number]   MEDICAL RECORD NO.:  [id number]                   PATIENT TYPE:  AMB   LOCATION:  ENDO                                 FACILITY:  [facility]   PHYSICIAN:  Anselmo Rod, M.D.               DATE OF BIRTH:  Aug 15, 1951   DATE OF PROCEDURE:  04/05/2003  DATE OF DISCHARGE:  04/05/2003                                 OPERATIVE REPORT   PROCEDURE PERFORMED:  Colonoscopy with biopsies.   ENDOSCOPIST:  Anselmo Rod, M.D.   INSTRUMENT USED:  Olympus video colonoscope.   INDICATIONS FOR PROCEDURE:  A 60 year old female undergoing screening  colonoscopy.  The patient has a history of change in bowel habits in the  recent past.  Rule out colonic polyps, masses, etc.   PREPROCEDURE PREPARATION:  Informed consent was procured from the patient.  The patient was fasted for eight hours prior to the procedure and prepped  with a bottle of magnesium citrate and a gallon of GoLYTELY the night prior  to the procedure.   PREPROCEDURE PHYSICAL:  VITAL SIGNS:  The patient had stable vital signs.  NECK:  Supple.  CHEST: Clear to auscultation.  S1 and S2 regular.  ABDOMEN: Soft with normal bowel sounds.   DESCRIPTION OF PROCEDURE:  The patient was placed in the left lateral  decubitus position and sedated with 70 mg of Demerol and 7 mg of Versed  intravenously.  Once the patient was adequately sedated and maintained on  low flow oxygen, continuous cardiac monitoring.  The Olympus video  colonoscope was advanced in the rectum to the cecum with difficulty.  There  was a large amount of stool in the cecum and right colon.  The cecum was not  visualized.  Small sessile polyp was biopsied from the mid left colon.  Isolated diverticulum was also seen in the mid left colon.  Small internal  hemorrhoids were seen on retroflexion.  Small lesions could have been  missed.   IMPRESSION:  1. Small polyp, biopsied from the left colon.  2.  Small non bleeding internal hemorrhoids.  3. Isolated diverticulum in the left colon.  4. A large amount of residual stool in the colon, especially cecum and     rectum.  Cecal base not visualized.    RECOMMENDATIONS:  1. Await pathology results.  2. Outpatient follow up in the next two weeks for further recommendations.                                               Anselmo Rod, M.D.    JNM/MEDQ  D:  04/08/2003  T:  04/08/2003  Job:  [id number]   cc:   Naima A. Normand Sloop, M.D.  [address]  [place]  [place] [address]  Fax: [phone]

## 2011-04-09 NOTE — Discharge Summary (Signed)
NAME:  Brenda Murray, Brenda Murray NO.:  [id number]   MEDICAL RECORD NO.:  [id number]                   PATIENT TYPE:  IPS   LOCATION:  0401                                 [facility]   PHYSICIAN:  Geoffery Lyons, M.D.                   DATE OF BIRTH:  1951-03-27   DATE OF ADMISSION:  01/17/2003  DATE OF DISCHARGE:  01/25/2003                                 DISCHARGE SUMMARY   CHIEF COMPLAINT AND PRESENT ILLNESS:  This was the first admission to [facility] for this 60 year old African-American female,  widowed, voluntarily admitted.  She had a long history of bipolar illness.  She moved to [facility] after being widowed for one year when her husband  died of cancer.  She was increasingly tearful, agitated, worse since  starting on Wellbutrin.  She was previously managed for a long time on  Klonopin along with some Ativan and lithium.  Upon this evaluation, the  patient was agitated and tearful, hearing voices saying they were coming to  get her.   PAST PSYCHIATRIC HISTORY:  This was the first time at [facility].  She was being seen in [place].  She was seeing Mark P. Ilsa Iha,  M.D., for outpatient treatment.   SUBSTANCE ABUSE HISTORY:  She denied the use or abuse of any substances.   PAST MEDICAL HISTORY:  Noncontributory.   MEDICATIONS:  1. Lithium 300 mg three times a day.  2. Wellbutrin XL 150 mg daily, which some felt it that had exacerbated her     paranoia, tearfulness, and agitation.  3. She was previously taking Klonopin and lorazepam, which were     discontinued.   PHYSICAL EXAMINATION:  Physical examination was performed, failed to show  any acute findings.   MENTAL STATUS EXAM:  Mental status exam revealed a confused, frightened  appearing, African-American female, hypervigilant, suspicious, agitated,  shrieking in the hallway, shrieking that she was hearing voices that were  coming after her as she  listened to the PA system, feeling that the nurses  and other staff were there to harm her.  Speech was pressured.  Mood was  agitated, anxious, fearful.  Thought processes: Agitated coming from her  paranoia and delusions; positive for auditory hallucinations.  Cognitive:  Intact and oriented to person and place.   ADMISSION DIAGNOSES:   AXIS I:  1. Psychotic disorder, not otherwise specified.  2. Bipolar disorder.   AXIS II:  No diagnosis.   AXIS III:  No diagnosis.   AXIS IV:  Moderate.   AXIS V:  Global assessment of functioning upon admission 20, highest global  assessment of functioning in the last year 69.   LABORATORY DATA:  Other laboratory workup: Lithium level: Less than 0.25.  Hemoglobin 12.5.  Electrolytes: BUN 5, creatinine 1.5.  SGOT 18, SGPT 17.  TSH 2.23.  HOSPITAL COURSE:  She was admitted and started in intensive individual and  group psychotherapy.  She was placed back on lithium 300 mg three times a  day.  She was initially kept on the Wellbutrin that was eventually  discontinued.  She was placed on Klonopin 0.5 mg twice a day and then she  was placed on Zyprexa Zydis that was slowly increased up to 20 mg per day.  She was evidencing some fears, pretty frightened, not interacting much,  staying to herself, some evidence of paranoid ideations, responding to  internal stimuli.  She was very dysphoric.  Affect was from constricted to  anger.  She continued to be paranoid, suspicious, reserved, guarded.  She  became a little bit more open about her perceptions and wanting some  clarification if what she was perceiving was accurate.  She had difficulty  engaging.  She continued to complain of fear and anxiety.  She was gradually  able to sit down and talk without seeming to be scared or overwhelmed.  To  the mid phase of the hospitalization, there were some issues as a far as her  taking the medications.  We had to encourage her and worked to increase the   reality testing and the need to take the medication to maintain the symptoms  under control.  By March 4, she felt that she was doing better.  Apparently,  the son had visited and was comfortable with her discharge.  So on March 5,  she was compliant with medications, there were no side effects.  She said  she understood the need to take her medications.  She was willing to follow  up with Mark P. Ilsa Iha, M.D.  There were no suicidal or homicidal ideas, so  we went ahead and discharged to outpatient followup.   DISCHARGE DIAGNOSES:   AXIS I:  1. Bipolar disorder.  2. Psychotic disorder, not otherwise specified.   AXIS II:  No diagnosis.   AXIS III:  No diagnosis.   AXIS IV:  Moderate.   AXIS V:  Global assessment of functioning upon discharge 50.   DISCHARGE MEDICATIONS:  1. Lithium carbonate 300 mg three times a day.  2. Klonopin 0.5 mg twice a day.  3. Zyprexa Zydis 20 mg at night.   FOLLOW UP:  She was to follow up with Mark P. Ilsa Iha, M.D.                                                 Geoffery Lyons, M.D.    IL/MEDQ  D:  02/26/2003  T:  02/26/2003  Job:  [id number]

## 2011-04-09 NOTE — Op Note (Signed)
NAME:  Brenda Murray, Brenda Murray NO.:  [id number]   MEDICAL RECORD NO.:  [id number]          PATIENT TYPE:  INP   LOCATION:  5038                         FACILITY:  [facility]   PHYSICIAN:  Harvie Junior, M.D.   DATE OF BIRTH:  16-Feb-1951   DATE OF PROCEDURE:  DATE OF DISCHARGE:                                 OPERATIVE REPORT   PREOPERATIVE DIAGNOSES:  1. End-stage degenerative joint disease.  2. Left knee degenerative joint disease.   POSTPROCEDURE DIAGNOSES:  1. End-stage degenerative joint disease.  2. Left knee degenerative joint disease.   PROCEDURE:  1. Right total knee replacement with sigma system, a size 2 cemented      femur, size 2 cemented tibia, 10 mm bridging bearing, a 32 mm all      polyethelene patella.  2. Computer-assisted total knee replacement, right.  3. Left knee injection with 6 mL of 0.5% Marcaine and 2 mL of 80 mg/mL      Depo-Medrol.   SURGEON:  Harvie Junior, M.D.   ASSISTANTBrynda Greathouse, who is a [employer] Consulting civil engineer.   BRIEF HISTORY:  A 60 year old female who is outlined as before who is having  significant pain in bilateral knees because of failure of all conservative  care and x-ray showing bone-on-bone changes that we brought to the operating  room for right total knee replacement.  She then asked that if we could  inject her left knee while she was under anesthesia and this was to be  accomplished procedure.  Because of her young age and large size, it was  felt that computer assistance was critical in gaining neutral overall limb  alignment and so we plan to use the computer for assistance during her case.   PROCEDURE:  Patient brought to the operating room and after adequate  anesthesia was obtained with general anesthetic, the patient is placed on  the operating room table.  Once the patient was placed to sleep, the left  knee was injected with 6 mL of 0.5% Marcaine with 2 mL of 80 mg/mL of Depo-  Medrol.  Once this was accomplished,  attention was turned towards the right  knee where a Foley catheter was placed and the right knee had the tourniquet  applied and was then prepped and draped in the usual sterile fashion.  Following this, a midline incision was made.  The leg was exsanguinated and  blood pressure insufflated, 350 mmHg of mercury.  Following this a midline  incision was made to the subcutaneous tissue down to the level of the  patellar retinaculum.  Medial parapatellar arthrotomy was then undertaken.  The medial and lateral meniscus was then removed.  The anterior and  posterior cruciate ligaments were then removed.  At this point, the  osteophytes were removed and at this point the computer-assistive device was  placed; 2 pins through the tibia medial side, 2 pins through the femur on  the medial side and then the radius were locked in place.  Once this was  accomplished, we checked the long alignment which was easy with this in  neutral after medial release going into the case and then once this was  accomplished we could see now that we had about a 10-degree flexion  contracture and at this point the registration was undertaken.  This takes  about 20 minutes to get the computer assistance module set up and  registered.  Once this was accomplished, the tibia was cut perpendicular to  its long axis with the computer assistance.  Once this was accomplished,  attention was turned to the distal femur which was cut perpendicular to it.  Once this was accomplished, the tensiometer was used to balance the gaps in  both flexion and extension.  When this was accomplished, attention was  turned towards cutting the distal femur.  There was still issues with  increasing the phase 4 components and at this point we cut 2 additional  millimeters of tibia and then this allowed access for the spacial blockers  to show that we had a good space available for her bridging bearing.  Once  this was accomplished, attention was  turned to the flexion gap where the  balancing was also put in place and this did go in nicely.   At this point, attention was turned towards the distal femur where it was  cut, but the distal femur had already been cut.  At this point, the  rotational alignment was undertaken with the computer and this gave [place]  excellent rotational alignment and the size 2 jig was then put in place and  all the cuts were made with a size 2 jig.  Once this was accomplished, the  box was cut and the size 2 implants were placed, the tibia was incised to a  2.  It was hammered in place and it was drilled and then keeled.  Once this  was accomplished, attention was turned towards the tibia which was keeled  and at this point the trial components were put in place, 10-mm bridge  bearing.  Patellar was incised and cut down to a 13 mm of patella and then a  32 mm polyethelene trial was put in place at this point.  Knee was put  through the range of motion and computer assistance told us that we had  perfect neutral long alignment.  We had an extension of 1 degree difference  of our gaps lateral and medial, slightly larger on the medial side and  flexion.  Similarly 1 mm medial more generous gap on the medial side.  At  this point, the trial components were removed.  The final components were  cemented into place after the knee was copiously irrigated and lavaged  thoroughly and the _____ of the bone were debrided.  At this point, the  final cement mantel was placed and the component was hammered into place.  Once this was completed, attention was turned towards the computer module  again and this again showed that we had perfect long alignment with 1 degree  difference in our medial and lateral gaps with perfect neutral long  alignment and in flexion a similar 1 mm difference in the gaps.  At this  point, when the cement was allowed to hardened, the trial component was removed.  The tourniquet was let down,  bleeding was controlled with  electrocautery and at this point the excess cement was removed with a  osteotome.  Once this was completed, the knee was then again copiously  irrigated, suctioned dried.  The medium Hemovac drain was placed and medium  parapatellar arthrotomy was closed with #1 Vicryl running sutures and subcu  with #2-0 Vicryl and the skin with skin staples.  Sterile compression  dressing was applied as well as an ACE wrap from the heel all the way up  past the wound.  Knee immobilizer was applied and the patient was taken to  recovery and she was noted to be in satisfactory condition.  Estimated blood  loss for procedure was 100 mL.      Harvie Junior, M.D.  Electronically Signed     JLG/MEDQ  D:  06/20/2006  T:  06/21/2006  Job:  [id number]

## 2011-04-09 NOTE — Discharge Summary (Signed)
[name], [name] NO.:  [id number]   MEDICAL RECORD NO.:  [id number]          PATIENT TYPE:  INP   LOCATION:  5038                         FACILITY:  [facility]   PHYSICIAN:  Harvie Junior, M.D.   DATE OF BIRTH:  Jun 12, 1951   DATE OF ADMISSION:  06/20/2006  DATE OF DISCHARGE:  06/25/2006                                 DISCHARGE SUMMARY   ADMISSION DIAGNOSES:  1. Endstage degenerative arthritis, right knee.  2. Degenerative arthritis, left knee.  3. History of bipolar disorder.  4. Obesity.   DISCHARGE DIAGNOSES:  1. Endstage degenerative arthritis, right knee.  2. Degenerative arthritis, left knee.  3. History of bipolar disorder.  4. Obesity.   PROCEDURES IN HOSPITAL:  1. Right total knee arthroplasty, computer assisted, Jodi Geralds M.D.,      June 20, 2006.  2. Cortisone injection of the left knee, Jodi Geralds M.D., June 20, 2006.   BRIEF HISTORY:  Brenda Murray is a pleasant 60 year old female who has a long  history of bilateral knee pain.  Her right knee hurts worse than her left.  She has pain when she walks and really has difficulty walking more than a  city block.  She has night pain and pain with ambulation.  Standing x-rays  of her right knee shows she has bone-on-bone arthritis and nearly bone-on-  bone arthritis of her left knee as well.  She has gotten only temporary  relief with cortisone injections in her knees and, based upon her clinical  and radiographic findings, she was felt to be a candidate for a right total  knee arthroplasty and she is admitted for this.  Her left knee is also  arthritic and she has asked [place] if we could inject her left knee with  cortisone at the time her surgery and we will accommodate this for her.   PERTINENT LABORATORY STUDIES:  EKG, on admission, showed normal sinus rhythm  with marked sinus arrhythmia, nonspecific T-wave abnormality.  Hemoglobin,  on admission, was 12.5, hematocrit 39.5, indices were within normal  limits.  On postoperative day #1, her hemoglobin was 9.9.  #2, 8.9.  #3, 9.3.  Prothrombin time, on admission, was 14.3 seconds with an INR of 1.1.  On the  date of discharge, her prothrombin time was 26.6 with an INR of 2.3 on  Coumadin therapy.  CMET, on admission, was within normal limits.  BMET, on  postoperative #1, showed an elevated glucose of 130 and BMET on  postoperative day #2 showed no abnormalities.  Urinalysis, on admission,  showed no abnormalities.   HOSPITAL COURSE:  Patient underwent right total knee arthroplasty, computer  assisted, by Dr. Jodi Geralds, on the date of admission, June 20, 2006.  She  also had her left knee injected with cortisone.  This was a computer-  assisted knee replacement.  She tolerated the procedure well.  Preoperatively, she had been given gentamicin and Ancef IV and  postoperatively she was given 5 doses of Ancef 1 gram q.8 hours.  She was  put on a PCA morphine pump for pain control.  Physical therapy was ordered  for walker ambulation, weightbearing as tolerated on the right, and a CPM  machine was used for range of motion.  She had appropriate knee pain on  postoperative day #1, her laboratory data looked good.  She had gotten up  out of bed with physical therapy.  Oxygen has been used overnight since  surgery.  She is continued on her usual home medications.  On postoperative  day #2, her dressing was changed and her Hemovac drain was pulled.  She was  continued on anticoagulation and she made progress with physical therapy.  Her hemoglobin was 8.9, she was on iron therapy for this.  On postoperative  day #3, she was up in a chair, her hemoglobin was stable at 9.3, and her INR  was 2.5 on Coumadin.  Her wound was benign, she had positive bowel sounds,  and her calf was soft and nontender.  She continued with the total knee  protocol.  She made somewhat slow progress with physical therapy and needed  an additional day.  She was doing fairly  well on June 24, 2006, better,  but she was still not safe to be at home as she was a significant risk for a  fall based upon her progress in physical therapy.  She was not independently  getting out of bed, she had also not had a bowel movement at this point.  She was then discharged home on June 25, 2006.  She was doing well, ready  for discharge, her vital signs were stable, she was afebrile.  Her right  knee incision was benign, her calf was soft and nontender.  Neurovascular  status was intact distally.   She was discharged home in improved condition to be on her usual home  medications, was given a Rx Percocet 5 mg p.r.n. for pain, Coumadin per  pharmacy protocol x1 month postop for DVT prophylaxis.  Home-health physical  therapy will be needed as well as a home CPM for knee range of motion.  Her  activity status will be weightbearing as tolerated on the right with a  walker.  She will continue her usual home medications as well.  She will  follow up with Dr. Luiz Blare in the office in 10 days, sooner if any problems  occur.      Brenda Murray, P.A.      Harvie Junior, M.D.  Electronically Signed    JB/MEDQ  D:  07/14/2006  T:  07/14/2006  Job:  [id number]   cc:   Candyce Churn. Allyne Gee, M.D.

## 2011-04-09 NOTE — Discharge Summary (Signed)
NAMEJODELL, WEITMAN NO.:  [id number]   MEDICAL RECORD NO.:  [id number]          PATIENT TYPE:  INP   LOCATION:  5013                         FACILITY:  [facility]   PHYSICIAN:  Harvie Junior, M.D.   DATE OF BIRTH:  [birth year]   DATE OF ADMISSION:  10/28/2008  DATE OF DISCHARGE:  11/01/2008                               DISCHARGE SUMMARY   ADMITTING DIAGNOSES:  1. Cellulitis, left lower leg status post left total knee      arthroplasty.  2. History of bipolar disorder.  3. Anxiety disorder.  4. Status post right total knee arthroplasty.   DISCHARGE DIAGNOSES:  1. Cellulitis, left lower leg status post left total knee      arthroplasty.  2. History of bipolar disorder.  3. Anxiety disorder.  4. Status post right total knee arthroplasty.   PROCEDURES IN THE HOSPITAL:  None.   BRIEF HISTORY:  Brenda Murray is a 60 year old female who is status post  bilateral total knee replacements done in the past.  She presented to  [facility] with a 2-day history of left leg pain, redness, and  swelling.  Her right total knee was done in 2007, left total knee was  done in 2008 by Dr. Luiz Blare.  She had significant cellulitis of the  bilateral lower extremities.  She had a white blood cell count of 7.2,  hemoglobin of 10.6, potassium 3.6, creatinine of 0.82 and with the idea  that she has bilateral prostheses in her knees with cellulitis in her  left lower extremity, we thought she should be admitted to the hospital  for aggressive IV antibiotics and very close observation because of  concerns of this trailing up into her left knee.   PERTINENT LABORATORY STUDIES:  Hemoglobin on admission was 10.6,  hematocrit 33.2, WBCs 7.2.  Sed rate was 48.  Her BMET was within normal  limits.  C-reactive protein was elevated at 5.5.   HOSPITAL COURSE:  The patient admitted to the hospital, was started on  IV Ancef and tape pad was applied to her left leg as well as elevation.  She had significant tenderness to palpation.  She had also some  cellulitic changes in her right lower extremity as well which were  noted, but it was not as significant as left side.  She was continued on  IV antibiotics and made improvements.  She still had redness of both  legs, left greater than right on October 31, 2008.  We felt she need  additional day of IV Ancef.  On November 01, 2008, she is without  complaints, she is afebrile, vital signs were stable, both legs were  significantly improved with decreased redness.  She had resolving  cellulitis.  She was then discharged home in improved condition on a  regular diet.   MEDICATIONS AT DISCHARGE:  1. Clonazepam 0.5 mg 1 t.i.d. and then 2 tablets nightly.  2. Lithium 300 mg 2 tablets nightly.  3. Zyprexa 15 mg 1 nightly.  4. Tramadol 50 mg 1 q.6 h. p.r.n. pain.  5. Augmentin 875 mg 1  b.i.d.   ACTIVITY STATUS:  Weightbearing as tolerated bilaterally, elevating legs  as much as possible.   She will follow up Dr. Luiz Blare in the office in 1 week.      Marshia Ly, P.A.      Harvie Junior, M.D.  Electronically Signed    JB/MEDQ  D:  01/15/2009  T:  01/16/2009  Job:  [id number]

## 2011-05-07 ENCOUNTER — Encounter
Payer: PRIVATE HEALTH INSURANCE | Attending: Physical Medicine & Rehabilitation | Admitting: Physical Medicine & Rehabilitation

## 2011-05-07 DIAGNOSIS — M1710 Unilateral primary osteoarthritis, unspecified knee: Secondary | ICD-10-CM

## 2011-05-07 DIAGNOSIS — F319 Bipolar disorder, unspecified: Secondary | ICD-10-CM | POA: Insufficient documentation

## 2011-05-07 DIAGNOSIS — M25569 Pain in unspecified knee: Secondary | ICD-10-CM | POA: Insufficient documentation

## 2011-05-07 DIAGNOSIS — Z96659 Presence of unspecified artificial knee joint: Secondary | ICD-10-CM | POA: Insufficient documentation

## 2011-05-07 NOTE — Assessment & Plan Note (Signed)
Myisha is back regarding her arthritis of her knees, right greater than left.  She stopped the Flexeril as it was making her sick as well as tramadol.  She did not feel that the Mobic was helpful.  She went back to her Aleve and that has solved everything according her.  She is now feeling well.  She is walking more and occasionally feels some knee instability, however.  REVIEW OF SYSTEMS:  Notable for some depression.  Full 12-point review is in the written health and history section of the chart.  SOCIAL HISTORY:  Unchanged.  She is widowed.  PHYSICAL EXAMINATION:  VITAL SIGNS:  Blood pressure is 106/53, pulse 76, respiratory rate 18, and she is saturating 99% on room air. GENERAL:  The patient is pleasant, alert.  She remains obese.  She is dressed nicely in high-heeled shoes today. MUSCULOSKELETAL:  She bends and extends her knees with minimal pain, although significant crepitus there.  Some mild instability noted.  Gait is slightly wide based but really not antalgic today.  ASSESSMENT: 1. Bipolar disorder. 2. Bilateral osteoarthritis of the knees status post arthroplasties     with persistent pain, responding to naproxen.  PLAN: 1. We will continue with naproxen.  I told her to take this daily with     food.  I asked her to follow up with physical therapy who performed     therapy on her after her knee replacement regarding knee     stabilizing exercises. 2. We will have her follow up as needed here in the future.  She will     see her family doctor regarding her lithium level and thyroid     panel.     Ranelle Oyster, M.D. Electronically Signed    ZTS/MedQ D:  05/07/2011 10:59:28  T:  05/07/2011 23:40:36  Job #:  [id number]  cc:   Candyce Churn. Allyne Gee, M.D. Fax: [phone]  [facility]

## 2011-08-26 LAB — CBC
HCT: 33.2 % — ABNORMAL LOW (ref 36.0–46.0)
MCHC: 32 g/dL (ref 30.0–36.0)
MCV: 80.2 fL (ref 78.0–100.0)
RBC: 4.14 MIL/uL (ref 3.87–5.11)
WBC: 7.2 10*3/uL (ref 4.0–10.5)

## 2011-08-26 LAB — BASIC METABOLIC PANEL
BUN: 3 mg/dL — ABNORMAL LOW (ref 6–23)
CO2: 28 mEq/L (ref 19–32)
Chloride: 104 mEq/L (ref 96–112)
GFR calc Af Amer: >60 mL/min (ref >60)
Potassium: 3.6 mEq/L (ref 3.5–5.1)

## 2011-08-26 LAB — DIFFERENTIAL
Eosinophils Absolute: 0.3 10*3/uL (ref 0.0–0.7)
Eosinophils Relative: 4 % (ref 0–5)
Lymphocytes Relative: 17 % (ref 12–46)
Lymphs Abs: 1.3 10*3/uL (ref 0.7–4.0)
Monocytes Absolute: 0.6 10*3/uL (ref 0.1–1.0)

## 2011-08-26 LAB — SEDIMENTATION RATE: Sed Rate: 48 mm/hr — ABNORMAL HIGH (ref 0–22)

## 2011-09-02 ENCOUNTER — Emergency Department (HOSPITAL_COMMUNITY)
Admission: EM | Admit: 2011-09-02 | Discharge: 2011-09-02 | Disposition: A | Discharge status: Home / Self Care | Payer: PRIVATE HEALTH INSURANCE | Attending: Emergency Medicine | Admitting: Emergency Medicine

## 2011-09-02 ENCOUNTER — Emergency Department (HOSPITAL_COMMUNITY): Payer: PRIVATE HEALTH INSURANCE

## 2011-09-02 DIAGNOSIS — W010XXA Fall on same level from slipping, tripping and stumbling without subsequent striking against object, initial encounter: Secondary | ICD-10-CM | POA: Insufficient documentation

## 2011-09-02 DIAGNOSIS — E039 Hypothyroidism, unspecified: Secondary | ICD-10-CM | POA: Insufficient documentation

## 2011-09-02 DIAGNOSIS — S8000XA Contusion of unspecified knee, initial encounter: Secondary | ICD-10-CM | POA: Insufficient documentation

## 2011-09-02 DIAGNOSIS — M25559 Pain in unspecified hip: Secondary | ICD-10-CM | POA: Insufficient documentation

## 2011-09-02 DIAGNOSIS — M25569 Pain in unspecified knee: Secondary | ICD-10-CM | POA: Insufficient documentation

## 2011-09-02 DIAGNOSIS — Y92009 Unspecified place in unspecified non-institutional (private) residence as the place of occurrence of the external cause: Secondary | ICD-10-CM | POA: Insufficient documentation

## 2011-09-02 DIAGNOSIS — Z96659 Presence of unspecified artificial knee joint: Secondary | ICD-10-CM | POA: Insufficient documentation

## 2011-09-05 ENCOUNTER — Emergency Department (HOSPITAL_COMMUNITY): Payer: PRIVATE HEALTH INSURANCE

## 2011-09-05 ENCOUNTER — Emergency Department (HOSPITAL_COMMUNITY)
Admission: EM | Admit: 2011-09-05 | Discharge: 2011-09-06 | Disposition: A | Discharge status: Home / Self Care | Payer: PRIVATE HEALTH INSURANCE | Attending: Emergency Medicine | Admitting: Emergency Medicine

## 2011-09-05 DIAGNOSIS — R079 Chest pain, unspecified: Secondary | ICD-10-CM | POA: Insufficient documentation

## 2011-09-05 DIAGNOSIS — E039 Hypothyroidism, unspecified: Secondary | ICD-10-CM | POA: Insufficient documentation

## 2011-09-05 DIAGNOSIS — Z96659 Presence of unspecified artificial knee joint: Secondary | ICD-10-CM | POA: Insufficient documentation

## 2011-09-05 DIAGNOSIS — F3130 Bipolar disorder, current episode depressed, mild or moderate severity, unspecified: Secondary | ICD-10-CM | POA: Insufficient documentation

## 2011-09-05 DIAGNOSIS — M79609 Pain in unspecified limb: Secondary | ICD-10-CM | POA: Insufficient documentation

## 2011-09-05 DIAGNOSIS — R110 Nausea: Secondary | ICD-10-CM | POA: Insufficient documentation

## 2011-09-05 LAB — URINALYSIS, ROUTINE W REFLEX MICROSCOPIC
Bilirubin Urine: NEGATIVE
Hgb urine dipstick: NEGATIVE
Ketones, ur: NEGATIVE mg/dL
Specific Gravity, Urine: 1.024 (ref 1.005–1.030)
pH: 6 (ref 5.0–8.0)

## 2011-09-05 LAB — URINE MICROSCOPIC-ADD ON

## 2011-09-05 LAB — POCT I-STAT, CHEM 8
BUN: 11 mg/dL (ref 6–23)
Calcium, Ion: 1.28 mmol/L (ref 1.12–1.32)
Chloride: 99 meq/L (ref 96–112)
Creatinine, Ser: 0.9 mg/dL (ref 0.50–1.10)
Glucose, Bld: 91 mg/dL (ref 70–99)
HCT: 41 % (ref 36.0–46.0)
Hemoglobin: 13.9 g/dL (ref 12.0–15.0)
Potassium: 4.1 meq/L (ref 3.5–5.1)
Sodium: 138 meq/L (ref 135–145)
TCO2: 29 mmol/L (ref 0–100)

## 2011-09-05 LAB — POCT I-STAT TROPONIN I: Troponin i, poc: 0 ng/mL (ref 0.00–0.08)

## 2011-09-06 LAB — URINALYSIS, ROUTINE W REFLEX MICROSCOPIC
Glucose, UA: NEGATIVE
Protein, ur: NEGATIVE
Urobilinogen, UA: 0.2

## 2011-09-06 LAB — COMPREHENSIVE METABOLIC PANEL
ALT: 31
AST: 19
Albumin: 3.7
Chloride: 102
Creatinine, Ser: 0.73
GFR calc Af Amer: >60
Potassium: 3.8
Sodium: 141
Total Bilirubin: 0.4

## 2011-09-06 LAB — TYPE AND SCREEN
ABO/RH(D): A POS
Antibody Screen: NEGATIVE

## 2011-09-06 LAB — CBC
HCT: 28.3 — ABNORMAL LOW
Hemoglobin: 9.2 — ABNORMAL LOW
Hemoglobin: 9.4 — ABNORMAL LOW
Platelets: 259
Platelets: 310
RBC: 3.58 — ABNORMAL LOW
RBC: 3.7 — ABNORMAL LOW
WBC: 6.8
WBC: 8.4
WBC: 9.3

## 2011-09-06 LAB — PROTIME-INR
INR: 1.1
INR: 1.3
INR: 1.8 — ABNORMAL HIGH
INR: 2.1 — ABNORMAL HIGH
Prothrombin Time: 14.8
Prothrombin Time: 24.2 — ABNORMAL HIGH

## 2011-09-06 LAB — DIFFERENTIAL
Basophils Absolute: 0
Eosinophils Relative: 6 — ABNORMAL HIGH
Lymphocytes Relative: 28
Monocytes Absolute: 0.5

## 2011-09-06 LAB — BASIC METABOLIC PANEL
Calcium: 9.5
Creatinine, Ser: 0.8
GFR calc Af Amer: >60
GFR calc non Af Amer: >60

## 2011-09-07 LAB — URINE CULTURE

## 2011-11-15 ENCOUNTER — Emergency Department (HOSPITAL_COMMUNITY)
Admission: EM | Admit: 2011-11-15 | Discharge: 2011-11-15 | Disposition: A | Discharge status: Home / Self Care | Payer: PRIVATE HEALTH INSURANCE | Attending: Emergency Medicine | Admitting: Emergency Medicine

## 2011-11-15 ENCOUNTER — Emergency Department (HOSPITAL_COMMUNITY): Payer: PRIVATE HEALTH INSURANCE

## 2011-11-15 ENCOUNTER — Other Ambulatory Visit: Payer: Self-pay

## 2011-11-15 ENCOUNTER — Encounter: Payer: Self-pay | Admitting: *Deleted

## 2011-11-15 DIAGNOSIS — F319 Bipolar disorder, unspecified: Secondary | ICD-10-CM | POA: Insufficient documentation

## 2011-11-15 DIAGNOSIS — F419 Anxiety disorder, unspecified: Secondary | ICD-10-CM

## 2011-11-15 DIAGNOSIS — Z79899 Other long term (current) drug therapy: Secondary | ICD-10-CM | POA: Insufficient documentation

## 2011-11-15 DIAGNOSIS — E039 Hypothyroidism, unspecified: Secondary | ICD-10-CM | POA: Insufficient documentation

## 2011-11-15 DIAGNOSIS — F411 Generalized anxiety disorder: Secondary | ICD-10-CM | POA: Insufficient documentation

## 2011-11-15 HISTORY — DX: Anxiety disorder, unspecified: F41.9

## 2011-11-15 HISTORY — DX: Hypothyroidism, unspecified: E03.9

## 2011-11-15 HISTORY — DX: Bipolar disorder, unspecified: F31.9

## 2011-11-15 LAB — POCT I-STAT TROPONIN I: Troponin i, poc: 0 ng/mL (ref 0.00–0.08)

## 2011-11-15 LAB — URINE MICROSCOPIC-ADD ON

## 2011-11-15 LAB — CBC
HCT: 37 % (ref 36.0–46.0)
Hemoglobin: 11.7 g/dL — ABNORMAL LOW (ref 12.0–15.0)
MCH: 25.5 pg — ABNORMAL LOW (ref 26.0–34.0)
MCHC: 31.6 g/dL (ref 30.0–36.0)
MCV: 80.8 fL (ref 78.0–100.0)
Platelets: 317 10*3/uL (ref 150–400)
RBC: 4.58 MIL/uL (ref 3.87–5.11)
RDW: 15.5 % (ref 11.5–15.5)
WBC: 6.4 10*3/uL (ref 4.0–10.5)

## 2011-11-15 LAB — URINALYSIS, ROUTINE W REFLEX MICROSCOPIC
Bilirubin Urine: NEGATIVE
Glucose, UA: NEGATIVE mg/dL
Hgb urine dipstick: NEGATIVE
Ketones, ur: NEGATIVE mg/dL
Nitrite: NEGATIVE
Protein, ur: NEGATIVE mg/dL
Specific Gravity, Urine: 1.012 (ref 1.005–1.030)
Urobilinogen, UA: 0.2 mg/dL (ref 0.0–1.0)
pH: 7 (ref 5.0–8.0)

## 2011-11-15 LAB — BASIC METABOLIC PANEL
BUN: 12 mg/dL (ref 6–23)
CO2: 30 mEq/L (ref 19–32)
Calcium: 10.9 mg/dL — ABNORMAL HIGH (ref 8.4–10.5)
Chloride: 99 mEq/L (ref 96–112)
Creatinine, Ser: 0.69 mg/dL (ref 0.50–1.10)
GFR calc Af Amer: >90 mL/min (ref >90)
GFR calc non Af Amer: >90 mL/min (ref >90)
Glucose, Bld: 96 mg/dL (ref 70–99)
Potassium: 4 mEq/L (ref 3.5–5.1)
Sodium: 135 mEq/L (ref 135–145)

## 2011-11-15 LAB — TSH: TSH: 18.005 u[IU]/mL — ABNORMAL HIGH (ref 0.350–4.500)

## 2011-11-15 MED ORDER — DIAZEPAM 5 MG PO TABS
5.0000 mg | ORAL_TABLET | Freq: Once | ORAL | Status: AC
  Administered 2011-11-15: 5 mg via ORAL
  Filled 2011-11-15: qty 1

## 2011-11-15 NOTE — ED Provider Notes (Signed)
History    Brenda Murray with anxiety. " I just feel anxious." Onset about a week ago. Feels shaky. Says thyroid dosing just increased. Also has been taking vicodin for R knee pain but stopped about a week ago as well. Says only taking 1 per day though. Denies other med changes. No fever or chills. Denies pain. No n/v. No SOB. No urinary complaints. Denies trauma. No n/v/d.   CSN: [id number]  Arrival date & time 11/15/11  1150   First MD Initiated Contact with Patient 11/15/11 1313      Chief Complaint  Patient presents with  . Shaking    (Consider location/radiation/quality/duration/timing/severity/associated sxs/prior treatment) HPI  Past Medical History  Diagnosis Date  . Anxiety   . Hypothyroidism   . Bipolar disorder     Past Surgical History  Procedure Date  . Joint replacement     bilat knee    No family history on file.  History  Substance Use Topics  . Smoking status: Former Games developer  . Smokeless tobacco: Not on file  . Alcohol Use: No    OB History    Grav Para Term Preterm Abortions TAB SAB Ect Mult Living                  Review of Systems   Review of symptoms negative unless otherwise noted in HPI.   Allergies  Review of patient's allergies indicates no known allergies.  Home Medications   Current Outpatient Rx  Name Route Sig Dispense Refill  . CLONAZEPAM 0.5 MG PO TABS Oral Take 0.5-1 mg by mouth QID. Pt takes 1 tab 3 times daily. 2 tabs at bedtime    . HYDROCODONE-ACETAMINOPHEN 5-500 MG PO TABS Oral Take 1 tablet by mouth every 6 (six) hours as needed. pain    . LEVOTHYROXINE SODIUM 112 MCG PO TABS Oral Take 112 mcg by mouth daily.      Marland Kitchen LITHIUM CARBONATE 300 MG PO TABS Oral Take 600 mg by mouth 2 (two) times daily.     Marland Kitchen NAPROXEN SODIUM 220 MG PO TABS Oral Take 220 mg by mouth 2 (two) times daily with a meal.      . OLANZAPINE 10 MG PO TBDP Oral Take 20 mg by mouth at bedtime.      Marland Kitchen VITAMIN E 200 UNITS PO CAPS Oral Take 200 Units by mouth  daily.        BP 99/62  Pulse 75  Temp(Src) 98.1 F (36.7 C) (Oral)  Resp 16  SpO2 97%  Physical Exam  Nursing note and vitals reviewed. Constitutional: She appears well-developed and well-nourished. No distress.  HENT:  Head: Normocephalic and atraumatic.  Right Ear: External ear normal.  Left Ear: External ear normal.  Mouth/Throat: Oropharynx is clear and moist.  Eyes: Conjunctivae are normal. Right eye exhibits no discharge. Left eye exhibits no discharge.  Neck: Normal range of motion. Neck supple.  Cardiovascular: Normal rate, regular rhythm and normal heart sounds.  Exam reveals no gallop and no friction rub.   No murmur heard. Pulmonary/Chest: Effort normal and breath sounds normal. No respiratory distress.  Abdominal: Soft. She exhibits no distension. There is no tenderness.  Musculoskeletal: She exhibits no edema and no tenderness.  Lymphadenopathy:    She has no cervical adenopathy.  Neurological: She is alert.  Skin: Skin is warm and dry. She is not diaphoretic.  Psychiatric: She has a normal mood and affect. Her behavior is normal. Thought content normal.    ED Course  Procedures (including critical care time)  Labs Reviewed  CBC - Abnormal; Notable for the following:    Hemoglobin 11.7 (*)    MCH 25.5 (*)    All other components within normal limits  BASIC METABOLIC PANEL - Abnormal; Notable for the following:    Calcium 10.9 (*)    All other components within normal limits  URINALYSIS, ROUTINE W REFLEX MICROSCOPIC - Abnormal; Notable for the following:    APPearance CLOUDY (*)    Leukocytes, UA TRACE (*)    All other components within normal limits  URINE MICROSCOPIC-ADD ON - Abnormal; Notable for the following:    Squamous Epithelial / LPF FEW (*)    Bacteria, UA FEW (*)    All other components within normal limits  POCT I-STAT TROPONIN I  I-STAT TROPONIN I  TSH   No results found.  EKG:  Rhythm: normal sinus Rate: 65 Axis:  normal Intervals: normal  ST segments: NS ST changes. t wave flattening in III.   1. Anxiety       MDM  Brenda Murray with vague complaints of essentially feeling anxious.  Possibly related to increase in thyroid meds. Labs sent. Recently stopped taking vicodin, possible withdrawal from narcs but says only taking low dose. No hypoxia on room air. Doubt infectious. UA not suggestive. afebrile.  Lytes unremarkable. Pt with good insight and medical decision making capability. Pt clinically well appearing and think can be safely discharged at this time for f/u with her PCP.        Raeford Razor, MD 11/15/11 [phone]

## 2011-11-15 NOTE — ED Notes (Signed)
Pt states "I just feel jittery, it's been going on x 1 wk, I just don't feel good"

## 2011-11-15 NOTE — ED Notes (Signed)
Pt reports feeling anxious and jittery for the past week. States she thinks it is due to her medications. States in the morning after she takes her medicines she feels like she is going to pass out. Reports only change in medication is increased amount of clonazepam a few weeks ago when she told her doctor that she was having difficulty sleeping. Pt is A/O x4. Skin warm and dry. Respirations even and unlabored. NAD noted at this time.

## 2012-01-10 ENCOUNTER — Other Ambulatory Visit: Payer: Self-pay | Admitting: Obstetrics and Gynecology

## 2012-01-10 DIAGNOSIS — Z1231 Encounter for screening mammogram for malignant neoplasm of breast: Secondary | ICD-10-CM

## 2012-02-14 ENCOUNTER — Emergency Department (HOSPITAL_COMMUNITY)
Admission: EM | Admit: 2012-02-14 | Discharge: 2012-02-15 | Disposition: A | Discharge status: Home / Self Care | Payer: Medicare Other | Attending: Emergency Medicine | Admitting: Emergency Medicine

## 2012-02-14 ENCOUNTER — Encounter (HOSPITAL_COMMUNITY): Payer: Self-pay | Admitting: Emergency Medicine

## 2012-02-14 ENCOUNTER — Ambulatory Visit (INDEPENDENT_AMBULATORY_CARE_PROVIDER_SITE_OTHER): Payer: PRIVATE HEALTH INSURANCE | Admitting: Obstetrics and Gynecology

## 2012-02-14 DIAGNOSIS — M25559 Pain in unspecified hip: Secondary | ICD-10-CM | POA: Insufficient documentation

## 2012-02-14 DIAGNOSIS — M7989 Other specified soft tissue disorders: Secondary | ICD-10-CM | POA: Insufficient documentation

## 2012-02-14 DIAGNOSIS — Z124 Encounter for screening for malignant neoplasm of cervix: Secondary | ICD-10-CM

## 2012-02-14 DIAGNOSIS — F411 Generalized anxiety disorder: Secondary | ICD-10-CM | POA: Insufficient documentation

## 2012-02-14 DIAGNOSIS — M79609 Pain in unspecified limb: Secondary | ICD-10-CM | POA: Insufficient documentation

## 2012-02-14 DIAGNOSIS — E039 Hypothyroidism, unspecified: Secondary | ICD-10-CM | POA: Insufficient documentation

## 2012-02-14 DIAGNOSIS — Z01419 Encounter for gynecological examination (general) (routine) without abnormal findings: Secondary | ICD-10-CM

## 2012-02-14 DIAGNOSIS — Z96659 Presence of unspecified artificial knee joint: Secondary | ICD-10-CM | POA: Insufficient documentation

## 2012-02-14 DIAGNOSIS — F319 Bipolar disorder, unspecified: Secondary | ICD-10-CM | POA: Insufficient documentation

## 2012-02-14 NOTE — ED Notes (Signed)
Pt brought in by EMS with c/o leg pain and swelling  Pt has had bilateral knee replacements

## 2012-02-14 NOTE — ED Notes (Addendum)
Per EMS, Pt with bil knee replacements.  Pt c/o leg pain and swelling.  Pt with hx of same.  Pt from [facility].  Vitals:  138/97, 90, 18.  Pain 8/10.

## 2012-02-15 ENCOUNTER — Emergency Department (HOSPITAL_COMMUNITY): Payer: Medicare Other

## 2012-02-15 MED ORDER — ONDANSETRON HCL 4 MG/2ML IJ SOLN
4.0000 mg | Freq: Once | INTRAMUSCULAR | Status: AC
  Administered 2012-02-15: 4 mg via INTRAVENOUS
  Filled 2012-02-15: qty 2

## 2012-02-15 MED ORDER — ACETAMINOPHEN ER 650 MG PO TBCR: 650.0000 mg | EXTENDED_RELEASE_TABLET | Freq: Three times a day (TID) | ORAL | Status: DC | PRN

## 2012-02-15 MED ORDER — ACETAMINOPHEN 325 MG PO TABS
650.0000 mg | ORAL_TABLET | Freq: Once | ORAL | Status: AC
  Administered 2012-02-15: 650 mg via ORAL
  Filled 2012-02-15: qty 2

## 2012-02-15 MED ORDER — FENTANYL CITRATE 0.05 MG/ML IJ SOLN
12.5000 ug | Freq: Once | INTRAMUSCULAR | Status: AC
  Administered 2012-02-15: 12.5 ug via INTRAVENOUS
  Filled 2012-02-15: qty 2

## 2012-02-15 NOTE — Discharge Instructions (Signed)
Rest and take medications as prescribed. Keep your scheduled followup with orthopedic surgeon. Be evaluated sooner for any worsening condition  Arthralgia  Your caregiver has diagnosed you as suffering from an arthralgia. Arthralgia means there is pain in a joint. This can come from many reasons including:  Bruising the joint which causes soreness (inflammation) in the joint.  Wear and tear on the joints which occur as we grow older (osteoarthritis).  Overusing the joint.  Various forms of arthritis.  Infections of the joint.  Regardless of the cause of pain in your joint, most of these different pains respond to anti-inflammatory drugs and rest. The exception to this is when a joint is infected, and these cases are treated with antibiotics, if it is a bacterial infection.  HOME CARE INSTRUCTIONS  Rest the injured area for as long as directed by your caregiver. Then slowly start using the joint as directed by your caregiver and as the pain allows. Crutches as directed may be useful if the ankles, knees or hips are involved. If the knee was splinted or casted, continue use and care as directed. If an stretchy or elastic wrapping bandage has been applied today, it should be removed and re-applied every 3 to 4 hours. It should not be applied tightly, but firmly enough to keep swelling down. Watch toes and feet for swelling, bluish discoloration, coldness, numbness or excessive pain. If any of these problems (symptoms) occur, remove the ace bandage and re-apply more loosely. If these symptoms persist, contact your caregiver or return to this location.  For the first 24 hours, keep the injured extremity elevated on pillows while lying down.  Apply ice for 15 to 20 minutes to the sore joint every couple hours while awake for the first half day. Then 3 to 4 times per day for the first 48 hours. Put the ice in a plastic bag and place a towel between the bag of ice and your skin.  Wear any splinting, casting,  elastic bandage applications, or slings as instructed.  Only take over-the-counter or prescription medicines for pain, discomfort, or fever as directed by your caregiver. Do not use aspirin immediately after the injury unless instructed by your physician. Aspirin can cause increased bleeding and bruising of the tissues.  If you were given crutches, continue to use them as instructed and do not resume weight bearing on the sore joint until instructed.  Persistent pain and inability to use the sore joint as directed for more than 2 to 3 days are warning signs indicating that you should see a caregiver for a follow-up visit as soon as possible. Initially, a hairline fracture (break in bone) may not be evident on X-rays. Persistent pain and swelling indicate that further evaluation, non-weight bearing or use of the joint (use of crutches or slings as instructed), or further X-rays are indicated. X-rays may sometimes not show a small fracture until a week or 10 days later. Make a follow-up appointment with your own caregiver or one to whom we have referred you. A radiologist (specialist in reading X-rays) may read your X-rays. Make sure you know how you are to obtain your X-ray results. Do not assume everything is normal if you do not hear from [place].  SEEK MEDICAL CARE IF:  Bruising, swelling, or pain increases.  SEEK IMMEDIATE MEDICAL CARE IF:  Your fingers or toes are numb or blue.  The pain is not responding to medications and continues to stay the same or get worse.  The  pain in your joint becomes severe.  You develop a fever over 102 F (38.9 C).  It becomes impossible to move or use the joint.  MAKE SURE YOU:  Understand these instructions.  Will watch your condition.  Will get help right away if you are not doing well or get worse.

## 2012-02-16 NOTE — ED Provider Notes (Signed)
History     CSN: [id number]  Arrival date & time 02/14/12  2049   First MD Initiated Contact with Patient 02/14/12 2302      Chief Complaint  Patient presents with  . Leg Pain  . Leg Swelling    (Consider location/radiation/quality/duration/timing/severity/associated sxs/prior treatment) Patient is a 60 y.o. female presenting with leg pain. The history is provided by the patient.  Leg Pain  Incident onset: About a week ago. The incident occurred at home. There was no injury mechanism. The pain is present in the left hip. The pain is moderate. The pain has been constant since onset. Pertinent negatives include no numbness, no inability to bear weight, no loss of motion, no muscle weakness, no loss of sensation and no tingling. The symptoms are aggravated by activity. She has tried nothing for the symptoms.   patient has had bilateral knee replacements and most recently has had problems with her right knee. She is scheduled for surgical revision of her right knee and is pending clearance by her cardiologist. Will last week her left hip is bothering her and she attributes this to favoring her right leg due to her knee issues. Pain is sharp in quality and not radiating. No fall or trauma. She is able to walk with discomfort. She states she cannot tolerate any narcotic pain medicines and is requesting some other medicine at this time. Moderate in severity. Rest does help some. No known aggravating or alleviating factors otherwise.   Past Medical History  Diagnosis Date  . Anxiety   . Hypothyroidism   . Bipolar disorder     Past Surgical History  Procedure Date  . Joint replacement     bilat knee  . Carpel tunnel     History reviewed. No pertinent family history.  History  Substance Use Topics  . Smoking status: Former Games developer  . Smokeless tobacco: Not on file  . Alcohol Use: No    OB History    Grav Para Term Preterm Abortions TAB SAB Ect Mult Living                   Review of Systems  Constitutional: Negative for fever and chills.  HENT: Negative for neck pain and neck stiffness.   Eyes: Negative for pain.  Respiratory: Negative for shortness of breath.   Cardiovascular: Negative for chest pain.  Gastrointestinal: Negative for abdominal pain.  Genitourinary: Negative for dysuria.  Musculoskeletal: Negative for back pain.  Skin: Negative for rash.  Neurological: Negative for tingling, numbness and headaches.  All other systems reviewed and are negative.    Allergies  Review of patient's allergies indicates no known allergies.  Home Medications   Current Outpatient Rx  Name Route Sig Dispense Refill  . CLONAZEPAM 0.5 MG PO TABS Oral Take 0.5-1 mg by mouth QID. Pt takes 1 tab 3 times daily. 2 tabs at bedtime    . HYDROCODONE-ACETAMINOPHEN 5-500 MG PO TABS Oral Take 1 tablet by mouth every 6 (six) hours as needed. pain    . LEVOTHYROXINE SODIUM 112 MCG PO TABS Oral Take 112 mcg by mouth daily.      Marland Kitchen LITHIUM CARBONATE 300 MG PO TABS Oral Take 600 mg by mouth 2 (two) times daily.     Marland Kitchen NAPROXEN SODIUM 220 MG PO TABS Oral Take 220 mg by mouth 2 (two) times daily with a meal.      . OLANZAPINE 10 MG PO TBDP Oral Take 20 mg by mouth at bedtime.      Marland Kitchen  VITAMIN C 500 MG PO TABS Oral Take 500 mg by mouth daily.    Marland Kitchen VITAMIN E 200 UNITS PO CAPS Oral Take 200 Units by mouth daily.      . ACETAMINOPHEN ER 650 MG PO TBCR Oral Take 1 tablet (650 mg total) by mouth every 8 (eight) hours as needed for pain. 30 tablet 0    BP 122/76  Pulse 87  Temp(Src) 98.4 F (36.9 C) (Oral)  Resp 20  SpO2 99%  Physical Exam  Constitutional: She is oriented to person, place, and time. She appears well-developed and well-nourished.  HENT:  Head: Normocephalic and atraumatic.  Eyes: Conjunctivae and EOM are normal. Pupils are equal, round, and reactive to light.  Neck: Trachea normal. Neck supple. No thyromegaly present.  Cardiovascular: Normal rate, regular  rhythm, S1 normal, S2 normal and normal pulses.     No systolic murmur is present   No diastolic murmur is present  Pulses:      Radial pulses are 2+ on the right side, and 2+ on the left side.  Pulmonary/Chest: Effort normal and breath sounds normal. She has no wheezes. She has no rhonchi. She has no rales. She exhibits no tenderness.  Abdominal: Soft. Normal appearance and bowel sounds are normal. There is no tenderness. There is no CVA tenderness and negative Murphy's sign.  Musculoskeletal:       Left hip mildly tender to palpation with full range of motion and no deformity. Distal neurovascular is intact. Bilateral knees with swelling and postsurgical changes.  Neurological: She is alert and oriented to person, place, and time. She has normal strength. No cranial nerve deficit or sensory deficit. GCS eye subscore is 4. GCS verbal subscore is 5. GCS motor subscore is 6.  Skin: Skin is warm and dry. No rash noted. She is not diaphoretic.  Psychiatric: Her speech is normal.       Cooperative and appropriate    ED Course  Procedures (including critical care time)  Labs Reviewed - No data to display Dg Hip Complete Left  02/15/2012  *RADIOLOGY REPORT*  Clinical Data: Left hip pain and swelling.  LEFT HIP - COMPLETE 2+ VIEW  Comparison: None.  Findings: There is no evidence of fracture or dislocation.  Both femoral heads are seated normally within their respective acetabula.  The proximal left femur appears intact.  No significant degenerative change is appreciated.  Mild sclerotic change is noted at the sacroiliac joints.  The visualized bowel gas pattern is grossly unremarkable in appearance.  IMPRESSION: No evidence of fracture or dislocation.  Original Report Authenticated By: Tonia Ghent, M.D.     1. Hip pain       MDM   Left hip pain with x-ray as above. Pain improved with small dose of IV fentanyl. Patient noted be taking Vicodin and she tells me that she occasionally takes  Naprosyn. Plan orthopedic followup as scheduled. Continue medications. Family bedside states understanding strict return precautions and all discharge and followup instructions.        Sunnie Nielsen, MD 02/16/12 [phone]

## 2012-02-25 ENCOUNTER — Ambulatory Visit (HOSPITAL_COMMUNITY): Payer: Medicare Other

## 2012-03-23 ENCOUNTER — Other Ambulatory Visit: Payer: Self-pay | Admitting: Obstetrics and Gynecology

## 2012-03-23 DIAGNOSIS — Z1231 Encounter for screening mammogram for malignant neoplasm of breast: Secondary | ICD-10-CM

## 2012-03-28 ENCOUNTER — Ambulatory Visit (HOSPITAL_COMMUNITY)
Admission: RE | Admit: 2012-03-28 | Discharge: 2012-03-28 | Disposition: A | Discharge status: Home / Self Care | Payer: PRIVATE HEALTH INSURANCE | Source: Ambulatory Visit | Attending: Obstetrics and Gynecology | Admitting: Obstetrics and Gynecology

## 2012-03-28 DIAGNOSIS — Z1231 Encounter for screening mammogram for malignant neoplasm of breast: Secondary | ICD-10-CM

## 2012-04-04 ENCOUNTER — Encounter: Payer: Self-pay | Admitting: Obstetrics and Gynecology

## 2012-04-07 ENCOUNTER — Telehealth: Payer: Self-pay | Admitting: Obstetrics and Gynecology

## 2012-04-07 NOTE — Telephone Encounter (Signed)
Triage/epic 

## 2012-04-07 NOTE — Telephone Encounter (Signed)
Lm on vm tcb rgd msg 

## 2012-04-07 NOTE — Telephone Encounter (Signed)
niccole/epic °

## 2012-04-10 ENCOUNTER — Telehealth: Payer: Self-pay | Admitting: Obstetrics and Gynecology

## 2012-04-10 NOTE — Telephone Encounter (Signed)
HEY EP, LINDA ASKED TO ROUTE THIS TO YOU SO THAT YOU CAN TELL HER WHAT TO TELL THE PATIENT, THANKS, KESHIA

## 2012-04-11 ENCOUNTER — Other Ambulatory Visit: Payer: Self-pay | Admitting: Orthopedic Surgery

## 2012-04-11 ENCOUNTER — Encounter: Payer: Self-pay | Admitting: Obstetrics and Gynecology

## 2012-04-12 ENCOUNTER — Encounter (HOSPITAL_COMMUNITY): Payer: Self-pay

## 2012-04-12 ENCOUNTER — Inpatient Hospital Stay (HOSPITAL_COMMUNITY): Admission: RE | Admit: 2012-04-12 | Discharge: 2012-04-12 | Payer: Medicare Other | Source: Ambulatory Visit

## 2012-04-12 ENCOUNTER — Telehealth: Payer: Self-pay | Admitting: Obstetrics and Gynecology

## 2012-04-12 HISTORY — DX: Personal history of other diseases of the digestive system: Z87.19

## 2012-04-12 NOTE — Progress Notes (Addendum)
Called patients PCP and got her last office note and ekg....there is no cxr there, so that will be done on her Lab Appt day.   DA

## 2012-04-12 NOTE — Telephone Encounter (Signed)
Return call to patient with questions about dense breast tissue.  Yesterday sent patient a letter explaining dense breast and the current philosophy surrounding it.  Left message notifying her of the letter that is en route and asked her to call if she has further questions.  Pearly Bartosik, PA-C

## 2012-04-12 NOTE — Telephone Encounter (Signed)
Triage/epic/same quest. As prior call

## 2012-04-13 ENCOUNTER — Telehealth: Payer: Self-pay | Admitting: Obstetrics and Gynecology

## 2012-04-13 NOTE — Telephone Encounter (Signed)
TC TO PT REGARDING MSG, LM ON VM TO CALL BACK. 

## 2012-04-13 NOTE — Telephone Encounter (Signed)
Triage/epic 

## 2012-04-14 ENCOUNTER — Telehealth: Payer: Self-pay | Admitting: Obstetrics and Gynecology

## 2012-04-14 NOTE — Telephone Encounter (Signed)
Triage/epic 

## 2012-04-14 NOTE — Telephone Encounter (Signed)
TC TO PT, LM ON VM TO CALL BACK.

## 2012-04-14 NOTE — Telephone Encounter (Signed)
Lm on vm tcb rgd msg 

## 2012-04-18 ENCOUNTER — Encounter (HOSPITAL_COMMUNITY): Payer: Self-pay | Admitting: Pharmacy Technician

## 2012-04-20 ENCOUNTER — Other Ambulatory Visit: Payer: Self-pay | Admitting: Orthopedic Surgery

## 2012-04-26 ENCOUNTER — Encounter (HOSPITAL_COMMUNITY)
Admission: RE | Admit: 2012-04-26 | Discharge: 2012-04-26 | Disposition: A | Discharge status: Home / Self Care | Payer: PRIVATE HEALTH INSURANCE | Source: Ambulatory Visit | Attending: Orthopedic Surgery | Admitting: Orthopedic Surgery

## 2012-04-26 LAB — COMPREHENSIVE METABOLIC PANEL
ALT: 17 U/L (ref 0–35)
Alkaline Phosphatase: 68 U/L (ref 39–117)
CO2: 29 mEq/L (ref 19–32)
Calcium: 10.2 mg/dL (ref 8.4–10.5)
GFR calc Af Amer: >90 mL/min (ref >90)
GFR calc non Af Amer: >90 mL/min (ref >90)
Glucose, Bld: 83 mg/dL (ref 70–99)
Sodium: 136 mEq/L (ref 135–145)
Total Bilirubin: 0.2 mg/dL — ABNORMAL LOW (ref 0.3–1.2)

## 2012-04-26 LAB — SURGICAL PCR SCREEN
MRSA, PCR: NEGATIVE
Staphylococcus aureus: NEGATIVE

## 2012-04-26 LAB — URINALYSIS, ROUTINE W REFLEX MICROSCOPIC
Bilirubin Urine: NEGATIVE
Hgb urine dipstick: NEGATIVE
Specific Gravity, Urine: 1.006 (ref 1.005–1.030)
pH: 6.5 (ref 5.0–8.0)

## 2012-04-26 LAB — DIFFERENTIAL
Basophils Absolute: 0 10*3/uL (ref 0.0–0.1)
Basophils Relative: 0 % (ref 0–1)
Eosinophils Absolute: 0.2 10*3/uL (ref 0.0–0.7)
Eosinophils Relative: 4 % (ref 0–5)

## 2012-04-26 LAB — PROTIME-INR: Prothrombin Time: 13.8 seconds (ref 11.6–15.2)

## 2012-04-26 LAB — CBC
Hemoglobin: 11.4 g/dL — ABNORMAL LOW (ref 12.0–15.0)
MCH: 26.8 pg (ref 26.0–34.0)
RBC: 4.26 MIL/uL (ref 3.87–5.11)

## 2012-04-26 NOTE — Pre-Procedure Instructions (Signed)
20 Brenda Murray  04/26/2012   Your procedure is scheduled on:  Monday May 01, 2012.  Report to [facility] at 0800 AM.  Call this number if you have problems the morning of surgery: [phone]   Remember:   Do not eat food:After Midnight.  May have clear liquids: up to 4 Hours before arrival until 0400 am.  Clear liquids include soda, tea, black coffee, apple or grape juice, broth.  Take these medicines the morning of surgery with A SIP OF WATER: Clonazepam (Klonopin), Hydrocodone (Vicodin) if needed for pain, and Levothyroxine (Synthroid).   Do not wear jewelry, make-up or nail polish.  Do not wear lotions, powders, or perfumes. You may wear deodorant.  Do not shave 48 hours prior to surgery. Men may shave face and neck.  Do not bring valuables to the hospital.  Contacts, dentures or bridgework may not be worn into surgery.  Leave suitcase in the car. After surgery it may be brought to your room.  For patients admitted to the hospital, checkout time is 11:00 AM the day of discharge.   Patients discharged the day of surgery will not be allowed to drive home.  Name and phone number of your driver:   Special Instructions: CHG Shower Use Special Wash: 1/2 bottle night before surgery and 1/2 bottle morning of surgery.   Please read over the following fact sheets that you were given: Pain Booklet, Coughing and Deep Breathing, Blood Transfusion Information, Total Joint Packet, MRSA Information and Surgical Site Infection Prevention

## 2012-04-27 NOTE — Consult Note (Signed)
Anesthesia Chart Review:  Patient is a 61 year old female scheduled for a right TK revision on 05/01/12.  History includes former smoker, hypothyroidism, Bipolar disorder, anxiety, indigestion, bilateral TKR '02, '03.  She is followed at [facility]).  Labs noted.  EKG on 01/19/12 ([facility]) showed NSR.   CXR on 04/26/12 showed: Mild chronic bronchitic changes.  Chronic lung changes including a vague density in the right upper lobe likely scarring, not significantly changed since 06/26/2007. No acute abnormalities.  Plan to proceed.  Shonna Chock, PA-C

## 2012-04-30 MED ORDER — CEFAZOLIN SODIUM-DEXTROSE 2-3 GM-% IV SOLR
2.0000 g | INTRAVENOUS | Status: AC
  Administered 2012-05-01: 2 g via INTRAVENOUS

## 2012-05-01 ENCOUNTER — Encounter (HOSPITAL_COMMUNITY): Payer: Self-pay | Admitting: Vascular Surgery

## 2012-05-01 ENCOUNTER — Encounter (HOSPITAL_COMMUNITY): Payer: Self-pay | Admitting: *Deleted

## 2012-05-01 ENCOUNTER — Ambulatory Visit (HOSPITAL_COMMUNITY): Payer: PRIVATE HEALTH INSURANCE | Admitting: Vascular Surgery

## 2012-05-01 ENCOUNTER — Inpatient Hospital Stay (HOSPITAL_COMMUNITY)
Admission: RE | Admit: 2012-05-01 | Discharge: 2012-05-05 | DRG: 467 | Disposition: A | Discharge status: Skilled Nursing Facility | Payer: PRIVATE HEALTH INSURANCE | Source: Ambulatory Visit | Attending: Orthopedic Surgery | Admitting: Orthopedic Surgery

## 2012-05-01 ENCOUNTER — Encounter (HOSPITAL_COMMUNITY)
Admission: RE | Disposition: A | Discharge status: Skilled Nursing Facility | Payer: Self-pay | Source: Ambulatory Visit | Attending: Orthopedic Surgery

## 2012-05-01 DIAGNOSIS — T84039A Mechanical loosening of unspecified internal prosthetic joint, initial encounter: Principal | ICD-10-CM | POA: Diagnosis present

## 2012-05-01 DIAGNOSIS — M1710 Unilateral primary osteoarthritis, unspecified knee: Secondary | ICD-10-CM | POA: Diagnosis present

## 2012-05-01 DIAGNOSIS — F319 Bipolar disorder, unspecified: Secondary | ICD-10-CM | POA: Diagnosis present

## 2012-05-01 DIAGNOSIS — Z79899 Other long term (current) drug therapy: Secondary | ICD-10-CM

## 2012-05-01 DIAGNOSIS — D62 Acute posthemorrhagic anemia: Secondary | ICD-10-CM | POA: Diagnosis not present

## 2012-05-01 DIAGNOSIS — Y831 Surgical operation with implant of artificial internal device as the cause of abnormal reaction of the patient, or of later complication, without mention of misadventure at the time of the procedure: Secondary | ICD-10-CM | POA: Diagnosis present

## 2012-05-01 DIAGNOSIS — Y92009 Unspecified place in unspecified non-institutional (private) residence as the place of occurrence of the external cause: Secondary | ICD-10-CM

## 2012-05-01 DIAGNOSIS — E039 Hypothyroidism, unspecified: Secondary | ICD-10-CM | POA: Diagnosis present

## 2012-05-01 DIAGNOSIS — Z87891 Personal history of nicotine dependence: Secondary | ICD-10-CM

## 2012-05-01 DIAGNOSIS — Z01812 Encounter for preprocedural laboratory examination: Secondary | ICD-10-CM

## 2012-05-01 DIAGNOSIS — F064 Anxiety disorder due to known physiological condition: Secondary | ICD-10-CM | POA: Diagnosis present

## 2012-05-01 DIAGNOSIS — Z96659 Presence of unspecified artificial knee joint: Secondary | ICD-10-CM

## 2012-05-01 DIAGNOSIS — T8484XA Pain due to internal orthopedic prosthetic devices, implants and grafts, initial encounter: Secondary | ICD-10-CM

## 2012-05-01 HISTORY — PX: TOTAL KNEE REVISION: SHX996

## 2012-05-01 SURGERY — TOTAL KNEE REVISION
Anesthesia: Regional | Site: Knee | Laterality: Right | Wound class: Clean

## 2012-05-01 MED ORDER — LIDOCAINE HCL (CARDIAC) 20 MG/ML IV SOLN
INTRAVENOUS | Status: DC | PRN
  Administered 2012-05-01: 50 mg via INTRAVENOUS

## 2012-05-01 MED ORDER — BUPIVACAINE-EPINEPHRINE 0.25% -1:200000 IJ SOLN
INTRAMUSCULAR | Status: DC | PRN
  Administered 2012-05-01: 20 mL

## 2012-05-01 MED ORDER — ACETAMINOPHEN 325 MG PO TABS: 650.0000 mg | ORAL_TABLET | Freq: Four times a day (QID) | ORAL | Status: DC | PRN

## 2012-05-01 MED ORDER — ACETAMINOPHEN 10 MG/ML IV SOLN
1000.0000 mg | Freq: Four times a day (QID) | INTRAVENOUS | Status: AC
  Administered 2012-05-01 – 2012-05-02 (×4): 1000 mg via INTRAVENOUS
  Filled 2012-05-01 (×4): qty 100

## 2012-05-01 MED ORDER — CLONAZEPAM 1 MG PO TABS
1.0000 mg | ORAL_TABLET | Freq: Every day | ORAL | Status: DC
  Administered 2012-05-01 – 2012-05-04 (×4): 1 mg via ORAL
  Filled 2012-05-01 (×4): qty 1

## 2012-05-01 MED ORDER — DIPHENHYDRAMINE HCL 12.5 MG/5ML PO ELIX: 12.5000 mg | ORAL_SOLUTION | ORAL | Status: DC | PRN

## 2012-05-01 MED ORDER — PROPOFOL 10 MG/ML IV EMUL
INTRAVENOUS | Status: DC | PRN
  Administered 2012-05-01: 150 mg via INTRAVENOUS

## 2012-05-01 MED ORDER — HYDROMORPHONE HCL PF 1 MG/ML IJ SOLN
INTRAMUSCULAR | Status: AC
  Filled 2012-05-01: qty 1

## 2012-05-01 MED ORDER — CEFAZOLIN SODIUM-DEXTROSE 2-3 GM-% IV SOLR
INTRAVENOUS | Status: AC
  Filled 2012-05-01: qty 50

## 2012-05-01 MED ORDER — PHENYLEPHRINE HCL 10 MG/ML IJ SOLN
INTRAMUSCULAR | Status: DC | PRN
  Administered 2012-05-01: 80 ug via INTRAVENOUS

## 2012-05-01 MED ORDER — CELECOXIB 200 MG PO CAPS
200.0000 mg | ORAL_CAPSULE | Freq: Two times a day (BID) | ORAL | Status: DC
  Administered 2012-05-01 – 2012-05-05 (×8): 200 mg via ORAL
  Filled 2012-05-01 (×9): qty 1

## 2012-05-01 MED ORDER — LITHIUM CARBONATE 300 MG PO CAPS
600.0000 mg | ORAL_CAPSULE | Freq: Two times a day (BID) | ORAL | Status: DC
  Administered 2012-05-01 – 2012-05-05 (×8): 600 mg via ORAL
  Filled 2012-05-01 (×10): qty 2

## 2012-05-01 MED ORDER — SODIUM CHLORIDE 0.9 % IR SOLN
Status: DC | PRN
  Administered 2012-05-01: 3000 mL

## 2012-05-01 MED ORDER — OXYCODONE HCL 10 MG PO TB12
10.0000 mg | ORAL_TABLET | Freq: Two times a day (BID) | ORAL | Status: DC
  Administered 2012-05-01: 10 mg via ORAL
  Filled 2012-05-01: qty 1

## 2012-05-01 MED ORDER — BUPIVACAINE ON-Q PAIN PUMP (FOR ORDER SET NO CHG)
INJECTION | Status: AC
  Filled 2012-05-01: qty 1

## 2012-05-01 MED ORDER — OLANZAPINE 10 MG PO TBDP
20.0000 mg | ORAL_TABLET | Freq: Every day | ORAL | Status: DC
  Administered 2012-05-01 – 2012-05-04 (×4): 20 mg via ORAL
  Filled 2012-05-01 (×5): qty 2

## 2012-05-01 MED ORDER — BUPIVACAINE 0.25 % ON-Q PUMP SINGLE CATH 300ML
INJECTION | Status: DC | PRN
  Administered 2012-05-01: 300 mL

## 2012-05-01 MED ORDER — ONDANSETRON HCL 4 MG PO TABS: 4.0000 mg | ORAL_TABLET | Freq: Four times a day (QID) | ORAL | Status: DC | PRN

## 2012-05-01 MED ORDER — PHENOL 1.4 % MT LIQD: 1.0000 | OROMUCOSAL | Status: DC | PRN

## 2012-05-01 MED ORDER — SODIUM CHLORIDE 0.9 % IV SOLN
INTRAVENOUS | Status: DC
  Administered 2012-05-01: 16:00:00 via INTRAVENOUS
  Administered 2012-05-02 (×2): 75 mL/h via INTRAVENOUS

## 2012-05-01 MED ORDER — BISACODYL 5 MG PO TBEC: 5.0000 mg | DELAYED_RELEASE_TABLET | Freq: Every day | ORAL | Status: DC | PRN

## 2012-05-01 MED ORDER — OXYCODONE HCL 5 MG PO TABS
5.0000 mg | ORAL_TABLET | ORAL | Status: DC | PRN
  Administered 2012-05-01: 10 mg via ORAL
  Filled 2012-05-01: qty 2

## 2012-05-01 MED ORDER — FENTANYL CITRATE 0.05 MG/ML IJ SOLN
100.0000 ug | Freq: Once | INTRAMUSCULAR | Status: AC
  Administered 2012-05-01: 100 ug via INTRAVENOUS

## 2012-05-01 MED ORDER — BUPIVACAINE-EPINEPHRINE PF 0.5-1:200000 % IJ SOLN
INTRAMUSCULAR | Status: DC | PRN
  Administered 2012-05-01: 30 mL

## 2012-05-01 MED ORDER — LEVOTHYROXINE SODIUM 112 MCG PO TABS
112.0000 ug | ORAL_TABLET | Freq: Every day | ORAL | Status: DC
  Administered 2012-05-02 – 2012-05-05 (×4): 112 ug via ORAL
  Filled 2012-05-01 (×5): qty 1

## 2012-05-01 MED ORDER — METHOCARBAMOL 100 MG/ML IJ SOLN
500.0000 mg | Freq: Four times a day (QID) | INTRAVENOUS | Status: DC | PRN
  Filled 2012-05-01: qty 5

## 2012-05-01 MED ORDER — 0.9 % SODIUM CHLORIDE (POUR BTL) OPTIME
TOPICAL | Status: DC | PRN
  Administered 2012-05-01: 1000 mL

## 2012-05-01 MED ORDER — ENOXAPARIN SODIUM 30 MG/0.3ML ~~LOC~~ SOLN
30.0000 mg | Freq: Two times a day (BID) | SUBCUTANEOUS | Status: DC
  Administered 2012-05-02 – 2012-05-05 (×7): 30 mg via SUBCUTANEOUS
  Filled 2012-05-01 (×9): qty 0.3

## 2012-05-01 MED ORDER — MIDAZOLAM HCL 2 MG/2ML IJ SOLN
INTRAMUSCULAR | Status: AC
  Filled 2012-05-01: qty 2

## 2012-05-01 MED ORDER — MENTHOL 3 MG MT LOZG: 1.0000 | LOZENGE | OROMUCOSAL | Status: DC | PRN

## 2012-05-01 MED ORDER — FENTANYL CITRATE 0.05 MG/ML IJ SOLN
INTRAMUSCULAR | Status: DC | PRN
  Administered 2012-05-01 (×3): 50 ug via INTRAVENOUS

## 2012-05-01 MED ORDER — ACETAMINOPHEN 10 MG/ML IV SOLN
INTRAVENOUS | Status: AC
  Filled 2012-05-01: qty 100

## 2012-05-01 MED ORDER — DOCUSATE SODIUM 100 MG PO CAPS
100.0000 mg | ORAL_CAPSULE | Freq: Two times a day (BID) | ORAL | Status: DC
  Administered 2012-05-01 – 2012-05-05 (×8): 100 mg via ORAL
  Filled 2012-05-01 (×8): qty 1

## 2012-05-01 MED ORDER — CHLORHEXIDINE GLUCONATE 4 % EX LIQD: 60.0000 mL | Freq: Once | CUTANEOUS | Status: DC

## 2012-05-01 MED ORDER — ONDANSETRON HCL 4 MG/2ML IJ SOLN: 4.0000 mg | Freq: Four times a day (QID) | INTRAMUSCULAR | Status: DC | PRN

## 2012-05-01 MED ORDER — SUCCINYLCHOLINE CHLORIDE 20 MG/ML IJ SOLN
INTRAMUSCULAR | Status: DC | PRN
  Administered 2012-05-01: 100 mg via INTRAVENOUS

## 2012-05-01 MED ORDER — METOCLOPRAMIDE HCL 10 MG PO TABS: 5.0000 mg | ORAL_TABLET | Freq: Three times a day (TID) | ORAL | Status: DC | PRN

## 2012-05-01 MED ORDER — METOCLOPRAMIDE HCL 5 MG/ML IJ SOLN: 5.0000 mg | Freq: Three times a day (TID) | INTRAMUSCULAR | Status: DC | PRN

## 2012-05-01 MED ORDER — FENTANYL CITRATE 0.05 MG/ML IJ SOLN
INTRAMUSCULAR | Status: AC
  Filled 2012-05-01: qty 2

## 2012-05-01 MED ORDER — FLEET ENEMA 7-19 GM/118ML RE ENEM: 1.0000 | ENEMA | Freq: Once | RECTAL | Status: AC | PRN

## 2012-05-01 MED ORDER — BUPIVACAINE 0.25 % ON-Q PUMP SINGLE CATH 300ML
300.0000 mL | INJECTION | Status: DC
  Filled 2012-05-01: qty 300

## 2012-05-01 MED ORDER — SENNA 8.6 MG PO TABS
1.0000 | ORAL_TABLET | Freq: Two times a day (BID) | ORAL | Status: DC
  Administered 2012-05-01 – 2012-05-05 (×8): 8.6 mg via ORAL
  Filled 2012-05-01 (×9): qty 1

## 2012-05-01 MED ORDER — METHOCARBAMOL 500 MG PO TABS
500.0000 mg | ORAL_TABLET | Freq: Four times a day (QID) | ORAL | Status: DC | PRN
  Administered 2012-05-01 – 2012-05-05 (×7): 500 mg via ORAL
  Filled 2012-05-01 (×7): qty 1

## 2012-05-01 MED ORDER — LACTATED RINGERS IV SOLN
INTRAVENOUS | Status: DC | PRN
  Administered 2012-05-01 (×2): via INTRAVENOUS

## 2012-05-01 MED ORDER — LITHIUM CARBONATE 300 MG PO TABS: 600.0000 mg | ORAL_TABLET | Freq: Two times a day (BID) | ORAL | Status: DC

## 2012-05-01 MED ORDER — ZOLPIDEM TARTRATE 5 MG PO TABS: 5.0000 mg | ORAL_TABLET | Freq: Every evening | ORAL | Status: DC | PRN

## 2012-05-01 MED ORDER — HYDROMORPHONE HCL PF 1 MG/ML IJ SOLN
0.5000 mg | INTRAMUSCULAR | Status: DC | PRN
  Administered 2012-05-01 – 2012-05-03 (×5): 0.5 mg via INTRAVENOUS
  Filled 2012-05-01 (×5): qty 1

## 2012-05-01 MED ORDER — MIDAZOLAM HCL 2 MG/2ML IJ SOLN
2.0000 mg | Freq: Once | INTRAMUSCULAR | Status: AC
  Administered 2012-05-01: 2 mg via INTRAVENOUS

## 2012-05-01 MED ORDER — ACETAMINOPHEN 650 MG RE SUPP: 650.0000 mg | Freq: Four times a day (QID) | RECTAL | Status: DC | PRN

## 2012-05-01 MED ORDER — SODIUM CHLORIDE 0.9 % IV SOLN: INTRAVENOUS | Status: DC

## 2012-05-01 MED ORDER — ALUM & MAG HYDROXIDE-SIMETH 200-200-20 MG/5ML PO SUSP: 30.0000 mL | ORAL | Status: DC | PRN

## 2012-05-01 MED ORDER — SENNOSIDES-DOCUSATE SODIUM 8.6-50 MG PO TABS: 1.0000 | ORAL_TABLET | Freq: Every evening | ORAL | Status: DC | PRN

## 2012-05-01 MED ORDER — DEXTROSE 5 % IV SOLN
500.0000 mg | INTRAVENOUS | Status: AC
  Filled 2012-05-01: qty 5

## 2012-05-01 MED ORDER — LACTATED RINGERS IV SOLN
INTRAVENOUS | Status: DC
  Administered 2012-05-01: 10:00:00 via INTRAVENOUS

## 2012-05-01 MED ORDER — ROCURONIUM BROMIDE 100 MG/10ML IV SOLN
INTRAVENOUS | Status: DC | PRN
  Administered 2012-05-01: 10 mg via INTRAVENOUS
  Administered 2012-05-01: 20 mg via INTRAVENOUS

## 2012-05-01 MED ORDER — ACETAMINOPHEN 10 MG/ML IV SOLN
1000.0000 mg | Freq: Four times a day (QID) | INTRAVENOUS | Status: DC
  Administered 2012-05-01: 1000 mg via INTRAVENOUS

## 2012-05-01 MED ORDER — CLONAZEPAM 0.5 MG PO TABS
0.5000 mg | ORAL_TABLET | Freq: Three times a day (TID) | ORAL | Status: DC
  Administered 2012-05-01 – 2012-05-05 (×9): 0.5 mg via ORAL
  Filled 2012-05-01 (×10): qty 1

## 2012-05-01 MED ORDER — HYDROMORPHONE HCL PF 1 MG/ML IJ SOLN
0.2500 mg | INTRAMUSCULAR | Status: DC | PRN
  Administered 2012-05-01 (×3): 0.5 mg via INTRAVENOUS

## 2012-05-01 MED ORDER — CLONAZEPAM 0.5 MG PO TABS: 0.5000 mg | ORAL_TABLET | Freq: Four times a day (QID) | ORAL | Status: DC

## 2012-05-01 MED ORDER — CEFAZOLIN SODIUM 1-5 GM-% IV SOLN
1.0000 g | Freq: Four times a day (QID) | INTRAVENOUS | Status: AC
  Administered 2012-05-01 – 2012-05-03 (×8): 1 g via INTRAVENOUS
  Filled 2012-05-01 (×8): qty 50

## 2012-05-01 SURGICAL SUPPLY — 88 items
BANDAGE ELASTIC 4 VELCRO ST LF (GAUZE/BANDAGES/DRESSINGS) ×2 IMPLANT
BANDAGE ESMARK 6X9 LF (GAUZE/BANDAGES/DRESSINGS) ×1 IMPLANT
BLADE SAGITTAL 13X1.27X60 (BLADE) ×2 IMPLANT
BLADE SAW SGTL 13X75X1.27 (BLADE) IMPLANT
BLADE SAW SGTL 83.5X18.5 (BLADE) ×2 IMPLANT
BLADE SAW SGTL NAR THIN XSHT (BLADE) ×2 IMPLANT
BNDG ELASTIC 6X10 VLCR STRL LF (GAUZE/BANDAGES/DRESSINGS) ×2 IMPLANT
BNDG ESMARK 6X9 LF (GAUZE/BANDAGES/DRESSINGS) ×2
BONE CEMENT PALACOS R-G (Orthopedic Implant) ×4 IMPLANT
BOWL SMART MIX CTS (DISPOSABLE) ×2 IMPLANT
BUPIVICAINE 0.25%  30ML ×2 IMPLANT
CATH KIT ON Q 5IN SLV (PAIN MANAGEMENT) ×2 IMPLANT
CEMENT BONE PALACOS R-G (Orthopedic Implant) ×2 IMPLANT
CLOTH BEACON ORANGE TIMEOUT ST (SAFETY) ×2 IMPLANT
CMPNT FEM C 60X53.5STSCR (Orthopedic Implant) ×1 IMPLANT
COMPONENT FEM C 60X53.5STSCR (Orthopedic Implant) ×1 IMPLANT
COVER SURGICAL LIGHT HANDLE (MISCELLANEOUS) ×4 IMPLANT
CUFF TOURNIQUET SINGLE 34IN LL (TOURNIQUET CUFF) ×2 IMPLANT
DRAPE EXTREMITY T 121X128X90 (DRAPE) ×2 IMPLANT
DRAPE INCISE IOBAN 66X45 STRL (DRAPES) ×4 IMPLANT
DRAPE PROXIMA HALF (DRAPES) ×2 IMPLANT
DRAPE U-SHAPE 47X51 STRL (DRAPES) ×2 IMPLANT
DRSG ADAPTIC 3X8 NADH LF (GAUZE/BANDAGES/DRESSINGS) ×2 IMPLANT
DRSG PAD ABDOMINAL 8X10 ST (GAUZE/BANDAGES/DRESSINGS) ×4 IMPLANT
DURAPREP 26ML APPLICATOR (WOUND CARE) ×2 IMPLANT
ELECT REM PT RETURN 9FT ADLT (ELECTROSURGICAL) ×2
ELECTRODE REM PT RTRN 9FT ADLT (ELECTROSURGICAL) ×1 IMPLANT
EVACUATOR 1/8 PVC DRAIN (DRAIN) ×2 IMPLANT
FACESHIELD LNG OPTICON STERILE (SAFETY) ×2 IMPLANT
FEMORAL COMP ZIMMER (Orthopedic Implant) ×2 IMPLANT
GLOVE BIOGEL M 7.0 STRL (GLOVE) IMPLANT
GLOVE BIOGEL PI IND STRL 6.5 (GLOVE) ×1 IMPLANT
GLOVE BIOGEL PI IND STRL 7.5 (GLOVE) IMPLANT
GLOVE BIOGEL PI IND STRL 8 (GLOVE) ×1 IMPLANT
GLOVE BIOGEL PI IND STRL 8.5 (GLOVE) ×2 IMPLANT
GLOVE BIOGEL PI INDICATOR 6.5 (GLOVE) ×1
GLOVE BIOGEL PI INDICATOR 7.5 (GLOVE)
GLOVE BIOGEL PI INDICATOR 8 (GLOVE) ×1
GLOVE BIOGEL PI INDICATOR 8.5 (GLOVE) ×2
GLOVE ECLIPSE 6.5 STRL STRAW (GLOVE) ×2 IMPLANT
GLOVE SURG ORTHO 8.0 STRL STRW (GLOVE) ×8 IMPLANT
GLOVE SURG SS PI 7.5 STRL IVOR (GLOVE) ×8 IMPLANT
GOWN PREVENTION PLUS XLARGE (GOWN DISPOSABLE) ×4 IMPLANT
GOWN SRG XL XLNG 56XLVL 4 (GOWN DISPOSABLE) ×1 IMPLANT
GOWN STRL NON-REIN LRG LVL3 (GOWN DISPOSABLE) ×4 IMPLANT
GOWN STRL NON-REIN XL XLG LVL4 (GOWN DISPOSABLE) ×1
HANDPIECE INTERPULSE COAX TIP (DISPOSABLE) ×1
HOOD PEEL AWAY FACE SHEILD DIS (HOOD) ×8 IMPLANT
KIT BASIN OR (CUSTOM PROCEDURE TRAY) ×2 IMPLANT
KIT ROOM TURNOVER OR (KITS) ×2 IMPLANT
MANIFOLD NEPTUNE II (INSTRUMENTS) ×2 IMPLANT
NEEDLE 18GX1X1/2 (RX/OR ONLY) (NEEDLE) IMPLANT
NEEDLE 22X1 1/2 (OR ONLY) (NEEDLE) ×2 IMPLANT
NS IRRIG 1000ML POUR BTL (IV SOLUTION) ×2 IMPLANT
Nexgen legacy articluar surface ×2 IMPLANT
PACK TOTAL JOINT (CUSTOM PROCEDURE TRAY) ×2 IMPLANT
PAD ARMBOARD 7.5X6 YLW CONV (MISCELLANEOUS) ×2 IMPLANT
PADDING CAST COTTON 6X4 STRL (CAST SUPPLIES) ×2 IMPLANT
PLATE TIB 3 66X42NT ZIER (Orthopedic Implant) ×1 IMPLANT
POSITIONER HEAD PRONE TRACH (MISCELLANEOUS) ×2 IMPLANT
SET HNDPC FAN SPRY TIP SCT (DISPOSABLE) ×1 IMPLANT
SPONGE GAUZE 4X4 12PLY (GAUZE/BANDAGES/DRESSINGS) ×2 IMPLANT
STAPLER VISISTAT 35W (STAPLE) ×2 IMPLANT
STEM ST EXT ZIER 100X145X10X (Stem) ×1 IMPLANT
STEM ST EXT ZIER 100X145X12X (Stem) ×1 IMPLANT
STEM ST EXT ZIMMER (Stem) ×2 IMPLANT
SUCTION FRAZIER TIP 10 FR DISP (SUCTIONS) ×2 IMPLANT
SUT BONE WAX W31G (SUTURE) ×2 IMPLANT
SUT FIBERWIRE #2 38 T-5 BLUE (SUTURE) ×2
SUT PDS AB 0 CT 36 (SUTURE) IMPLANT
SUT PDS AB 1 CT  36 (SUTURE) ×1
SUT PDS AB 1 CT 36 (SUTURE) ×1 IMPLANT
SUT PDS AB 2-0 CT1 27 (SUTURE) IMPLANT
SUT VIC AB 0 CTB1 27 (SUTURE) ×4 IMPLANT
SUT VIC AB 1 CT1 27 (SUTURE)
SUT VIC AB 1 CT1 27XBRD ANBCTR (SUTURE) IMPLANT
SUT VIC AB 2-0 CTB1 (SUTURE) ×4 IMPLANT
SUT VLOC 180 0 24IN GS25 (SUTURE) ×2 IMPLANT
SUTURE FIBERWR #2 38 T-5 BLUE (SUTURE) ×1 IMPLANT
SYR 20ML ECCENTRIC (SYRINGE) IMPLANT
SYR CONTROL 10ML LL (SYRINGE) ×2 IMPLANT
TIBIA ZIMMER (Orthopedic Implant) ×1 IMPLANT
TOWEL OR 17X24 6PK STRL BLUE (TOWEL DISPOSABLE) ×2 IMPLANT
TOWEL OR 17X26 10 PK STRL BLUE (TOWEL DISPOSABLE) ×2 IMPLANT
TRAY FOLEY CATH 14FR (SET/KITS/TRAYS/PACK) ×2 IMPLANT
TUBE ANAEROBIC SPECIMEN COL (MISCELLANEOUS) IMPLANT
Tibial half wedge size 3 16 degrees w/2 screws ×2 IMPLANT
WATER STERILE IRR 1000ML POUR (IV SOLUTION) ×2 IMPLANT

## 2012-05-01 NOTE — H&P (Signed)
  Brenda Murray MRN:  [id number] DOB/SEX:  June 13, 1951/female  CHIEF COMPLAINT:  Painful left Knee  HISTORY: Patient is a 61 y.o. female presented with a history of pain in the right knee. Onset of symptoms was gradual starting several months ago with gradually worsening course since that time. The patient noted no past surgery on the right knee. Prior procedures on the knee include arthroplasty. Patient has been treated conservatively with over-the-counter NSAIDs and activity modification. Patient currently rates pain in the knee at 10 out of 10 with activity. There is pain at night.  PAST MEDICAL HISTORY: There are no active problems to display for this patient.  Past Medical History  Diagnosis Date  . Anxiety   . Hypothyroidism   . Bipolar disorder   . History of indigestion    Past Surgical History  Procedure Date  . Joint replacement     bilat knee  . Carpel tunnel   . Tonsillectomy      MEDICATIONS:   No prescriptions prior to admission    ALLERGIES:  No Known Allergies  REVIEW OF SYSTEMS:  Pertinent items are noted in HPI.   FAMILY HISTORY:  No family history on file.  SOCIAL HISTORY:   History  Substance Use Topics  . Smoking status: Former Games developer  . Smokeless tobacco: Not on file  . Alcohol Use: No     EXAMINATION:  Vital signs in last 24 hours:    General appearance: alert, cooperative and no distress Lungs: clear to auscultation bilaterally Heart: regular rate and rhythm, S1, S2 normal, no murmur, click, rub or gallop Abdomen: soft, non-tender; bowel sounds normal; no masses,  no organomegaly Extremities: extremities normal, atraumatic, no cyanosis or edema and Homans sign is negative, no sign of DVT Pulses: 2+ and symmetric Skin: Skin color, texture, turgor normal. No rashes or lesions Neurologic: Alert and oriented X 3, normal strength and tone. Normal symmetric reflexes. Normal coordination and gait  Musculoskeletal:  ROM 0-90 , Ligaments  intact,  Imaging Review Plain radiographs demonstrate loose componets of the right knee. The overall alignment is neutral. The bone quality appears to be fair for age and reported activity level.  Assessment/Plan: End stage arthritis, right knee   The patient history, physical examination and imaging studies are consistent with loose components of the right knee. The patient has failed conservative treatment.  The clearance notes were reviewed.  After discussion with the patient it was felt that Total Knee Revision was indicated. The procedure,  risks, and benefits of total knee arthroplasty were presented and reviewed. The risks including but not limited to aseptic loosening, infection, blood clots, vascular injury, stiffness, patella tracking problems complications among others were discussed. The patient acknowledged the explanation, agreed to proceed with the plan.  Chilton Sallade 05/01/2012, 7:41 AM

## 2012-05-01 NOTE — Preoperative (Signed)
Beta Blockers   Reason not to administer Beta Blockers:Not Applicable 

## 2012-05-01 NOTE — Anesthesia Preprocedure Evaluation (Signed)
Anesthesia Evaluation  Patient identified by MRN, date of birth, ID band Patient awake    Reviewed: Allergy & Precautions, H&P , NPO status , Patient's Chart, lab work & pertinent test results  Airway Mallampati: II  Neck ROM: full    Dental   Pulmonary former smoker         Cardiovascular     Neuro/Psych Anxiety Bipolar Disorder    GI/Hepatic   Endo/Other  Hypothyroidism Morbid obesity  Renal/GU      Musculoskeletal   Abdominal   Peds  Hematology   Anesthesia Other Findings   Reproductive/Obstetrics                           Anesthesia Physical Anesthesia Plan  ASA: III  Anesthesia Plan: General and Regional   Post-op Pain Management: MAC Combined w/ Regional for Post-op pain   Induction: Intravenous  Airway Management Planned: Oral ETT  Additional Equipment:   Intra-op Plan:   Post-operative Plan: Extubation in OR  Informed Consent: I have reviewed the patients History and Physical, chart, labs and discussed the procedure including the risks, benefits and alternatives for the proposed anesthesia with the patient or authorized representative who has indicated his/her understanding and acceptance.     Plan Discussed with: CRNA and Surgeon  Anesthesia Plan Comments:         Anesthesia Quick Evaluation

## 2012-05-01 NOTE — Progress Notes (Signed)
Orthopedic Tech Progress Note Patient Details:  Brenda Murray 1951-07-06 [id number]  CPM Right Knee CPM Right Knee: On Right Knee Flexion (Degrees): 90  Right Knee Extension (Degrees): 0  Additional Comments: trapeze bar   Cammer, Mickie Bail 05/01/2012, 2:19 PM

## 2012-05-01 NOTE — Anesthesia Procedure Notes (Addendum)
Anesthesia Regional Block:  Femoral nerve block  Pre-Anesthetic Checklist: ,, timeout performed, Correct Patient, Correct Site, Correct Laterality, Correct Procedure,, site marked, risks and benefits discussed, Surgical consent,  Pre-op evaluation,  At surgeon's request and post-op pain management  Laterality: Right  Prep: chloraprep       Needles:  Injection technique: Single-shot  Needle Type: Echogenic Stimulator Needle     Needle Length: 9cm  Needle Gauge: 21    Additional Needles:  Procedures: nerve stimulator Femoral nerve block  Nerve Stimulator or Paresthesia:  Response: Quadriceps muscle contraction, 0.45 mA,   Additional Responses:   Narrative:  Start time: 05/01/2012 9:55 AM End time: 05/01/2012 10:11 AM Injection made incrementally with aspirations every 5 mL.  Performed by: Personally  Anesthesiologist: Dr Chaney Malling  Additional Notes: Functioning IV was confirmed and monitors were applied.  A 90mm 21ga Arrow echogenic stimulator needle was used. Sterile prep and drape,hand hygiene and sterile gloves were used.  Negative aspiration and negative test dose prior to incremental administration of local anesthetic. The patient tolerated the procedure well.    Femoral nerve block Procedure Name: Intubation Date/Time: 05/01/2012 10:32 AM Performed by: Gwenyth Allegra Pre-anesthesia Checklist: Emergency Drugs available, Patient identified, Suction available, Timeout performed and Patient being monitored Oxygen Delivery Method: Circle system utilized Preoxygenation: Pre-oxygenation with 100% oxygen Intubation Type: IV induction Laryngoscope Size: Mac Grade View: Grade I Tube type: Oral Tube size: 7.0 mm Number of attempts: 1 Airway Equipment and Method: Stylet Secured at: 21 cm Tube secured with: Tape

## 2012-05-01 NOTE — Transfer of Care (Signed)
Immediate Anesthesia Transfer of Care Note  Patient: Brenda Murray  Procedure(s) Performed: Procedure(s) (LRB): TOTAL KNEE REVISION (Right)  Patient Location: PACU  Anesthesia Type: General and GA combined with regional for post-op pain  Level of Consciousness: awake  Airway & Oxygen Therapy: Patient Spontanous Breathing and Patient connected to nasal cannula oxygen  Post-op Assessment: Report given to PACU RN and Post -op Vital signs reviewed and stable  Post vital signs: Reviewed and stable  Complications: No apparent anesthesia complications

## 2012-05-01 NOTE — Progress Notes (Addendum)
Patient monitor during nerve block patient oxygen saturations 95- 100% on RA and heart rate 78- 86 NSR. Timeout completed prior to starting nerve block

## 2012-05-02 ENCOUNTER — Encounter (HOSPITAL_COMMUNITY): Payer: Self-pay | Admitting: Orthopedic Surgery

## 2012-05-02 LAB — BASIC METABOLIC PANEL
BUN: 5 mg/dL — ABNORMAL LOW (ref 6–23)
CO2: 29 mEq/L (ref 19–32)
Calcium: 9.3 mg/dL (ref 8.4–10.5)
Creatinine, Ser: 0.67 mg/dL (ref 0.50–1.10)
Glucose, Bld: 125 mg/dL — ABNORMAL HIGH (ref 70–99)

## 2012-05-02 LAB — CBC
HCT: 30.7 % — ABNORMAL LOW (ref 36.0–46.0)
Hemoglobin: 9.4 g/dL — ABNORMAL LOW (ref 12.0–15.0)
MCH: 25.6 pg — ABNORMAL LOW (ref 26.0–34.0)
MCV: 83.7 fL (ref 78.0–100.0)
RBC: 3.67 MIL/uL — ABNORMAL LOW (ref 3.87–5.11)

## 2012-05-02 MED ORDER — HYDROCODONE-ACETAMINOPHEN 5-325 MG PO TABS
1.0000 | ORAL_TABLET | ORAL | Status: DC | PRN
  Administered 2012-05-02 (×2): 1 via ORAL
  Administered 2012-05-02 – 2012-05-05 (×9): 2 via ORAL
  Filled 2012-05-02 (×6): qty 2
  Filled 2012-05-02: qty 1
  Filled 2012-05-02 (×3): qty 2
  Filled 2012-05-02: qty 1

## 2012-05-02 MED ORDER — WHITE PETROLATUM GEL
Status: AC
  Filled 2012-05-02: qty 5

## 2012-05-02 NOTE — Progress Notes (Signed)
OT Cancellation Note  Treatment cancelled today due to spoke to PT and they recommended we wait until at least tomorrow to see patient due to patient's difficulty processing information, following directions, and remembering what she is taught.Brenda Murray [phone] 05/02/2012, 1:01 PM

## 2012-05-02 NOTE — Op Note (Signed)
TOTAL KNEE REPLACEMENT OPERATIVE NOTE:  05/01/2012  1:50 PM  PATIENT:  Brenda Murray  61 y.o. female  PRE-OPERATIVE DIAGNOSIS:  failed right total knee/loosening  POST-OPERATIVE DIAGNOSIS:  failed right total knee/loosening  PROCEDURE:  Procedure(s): TOTAL KNEE REVISION  SURGEON:  Surgeon(s): Raymon Mutton, MD  PHYSICIAN ASSISTANT: [name], [facility]  ANESTHESIA:   general  DRAINS: Hemovac and On-Q Marcaine Pain Pump  SPECIMEN: None  COUNTS:  Correct  TOURNIQUET:   Total Tourniquet Time Documented: Thigh (Right) - 116 minutes  DICTATION:  Indication for procedure:    The patient is a 61 y.o. female who has failed conservative treatment for failed right total knee/loosening.  Informed consent was obtained prior to anesthesia. The risks versus benefits of the operation were explain and in a way the patient can, and did, understand.   Description of procedure:     The patient was taken to the operating room and placed under anesthesia.  The patient was positioned in the usual fashion taking care that all body parts were adequately padded and/or protected.  I foley catheter was placed.  A tourniquet was applied and the leg prepped and draped in the usual sterile fashion.  The extremity was exsanguinated with the esmarch and tourniquet inflated to 350 mmHg.  Pre-operative range of motion was normal.  The knee was in 7 degree of significant varus.  A midline incision approximately 6-7 inches long was made with a #10 blade.  A new blade was used to make a parapatellar arthrotomy going 2-3 cm into the quadriceps tendon, over the patella, and alongside the medial aspect of the patellar tendon.  A synovectomy was then performed with the #10 blade and forceps. I then elevated the deep MCL off the medial tibial metaphysis subperiosteally around to the semimembranosus attachment.    I everted the patella and used calipers to measure patellar thickness.  I used the reamer to ream down  to appropriate thickness to recreate the native thickness.  I then removed excess bone with the rongeur and sagittal saw.  I used the appropriately sized template and drilled the three lug holes.  I then put the trial in place and measured the thickness with the calipers to ensure recreation of the native thickness.  The trial was then removed and the patella subluxed and the knee brought into flexion.  A homan retractor was place to retract and protect the patella and lateral structures.  A Z-retractor was place medially to protect the medial structures.  The femoral and tibial components were easily removed with a micro E saw and osteotomes.  Loose cement and debris were meticulously removed until there was nothing but good cancellous bone surfaces.  I then marked out the epicondylar axis on the distal femur.  The posterior condylar axis measured 3 degrees.  I then used the anterior referencing sizer and measured the femur to be a size C.  I hand reamed the femur to 12mm and the tibia to 10mm.  I cut the box on the femur for a size C implant with the trial size 12mm stem.  I then used a size 3 tibia with a small medial wedge to reconstruct the tibial side.  I trialed with a C femur with a 12 mm stem, a 4 tibia with a small medial wedge, and a 17mm CCK poly insert.  This afforded excellent stability and range of motion.   I cemented in the components and removed all excess cement.  The polyethylene  tibial component was snapped into place and the knee placed in extension while cement was hardening.  The capsule was infilltrated with 20cc of .25% Marcaine with epinephrine.  A hemovac was place in the joint exiting superolaterally.  A pain pump was place superomedially superficial to the arthrotomy.  Once the cement was hard, the tourniquet was let down.  Hemostasis was obtained.  The arthrotomy was closed with figure-8 #1 vicryl sutures.  The deep soft tissues were closed with #0 vicryls and the subcuticular  layer closed with a running #2-0 vicryl.  The skin was reapproximated and closed with skin staples.  The wound was dressed with xeroform, 4 x4's, 2 ABD sponges, a single layer of webril and a TED stocking.   The patient was then awakened, extubated, and taken to the recovery room in stable condition.  BLOOD LOSS:  300cc DRAINS: 1 hemovac, 1 pain catheter COMPLICATIONS:  None.  PLAN OF CARE: Admit to inpatient   PATIENT DISPOSITION:  PACU - hemodynamically stable.   Delay start of Pharmacological VTE agent (>24hrs) due to surgical blood loss or risk of bleeding:  not applicable  Please fax a copy of this op note to my office at [phone] (please only include page 1 and 2 of the Case Information op note)

## 2012-05-02 NOTE — Anesthesia Postprocedure Evaluation (Signed)
Anesthesia Post Note  Patient: Brenda Murray  Procedure(s) Performed: Procedure(s) (LRB): TOTAL KNEE REVISION (Right)  Anesthesia type: General  Patient location: PACU  Post pain: Pain level controlled and Adequate analgesia  Post assessment: Post-op Vital signs reviewed, Patient's Cardiovascular Status Stable, Respiratory Function Stable, Patent Airway and Pain level controlled  Last Vitals:  Filed Vitals:   05/02/12 0602  BP: 101/67  Pulse: 86  Temp: 36.3 C  Resp: 20    Post vital signs: Reviewed and stable  Level of consciousness: awake, alert  and oriented  Complications: No apparent anesthesia complications

## 2012-05-02 NOTE — Progress Notes (Signed)
Georgena Spurling, MD   [name], PA-C [address] [place], [place], [place]  [address]                             [phone]   PROGRESS NOTE  Subjective:  negative for Chest Pain  negative for Shortness of Breath  negative for Nausea/Vomiting   negative for Calf Pain  negative for Bowel Movement   Tolerating Diet: yes         Patient reports pain as 5 on 0-10 scale.    Objective: Vital signs in last 24 hours:   Patient Vitals for the past 24 hrs:  BP Temp Temp src Pulse Resp SpO2  05/02/12 0602 101/67 mmHg 97.3 F (36.3 C) - 86  20  100 %  05/02/12 0400 - - - - 16  100 %  05/02/12 0132 133/79 mmHg 98.4 F (36.9 C) - 79  18  100 %  05/02/12 0000 - - - - 14  100 %  05/01/12 2125 124/74 mmHg 98.6 F (37 C) - 77  20  100 %  05/01/12 2000 - - - - 16  100 %  05/01/12 1700 120/82 mmHg - - 72  - 100 %  05/01/12 1505 124/75 mmHg 98.5 F (36.9 C) Oral 60  12  100 %  05/01/12 1500 121/68 mmHg 97.8 F (36.6 C) - 64  12  96 %  05/01/12 1445 131/71 mmHg - - 69  14  97 %  05/01/12 1430 142/74 mmHg - - 74  17  97 %  05/01/12 1415 150/77 mmHg - - 75  17  95 %  05/01/12 1400 135/75 mmHg - - 85  20  95 %  05/01/12 1345 156/89 mmHg 97.9 F (36.6 C) - 89  18  95 %  05/01/12 0840 115/66 mmHg 98 F (36.7 C) Oral 71  18  97 %    @flow {1959:LAST@   Intake/Output from previous day:   06/10 0701 - 06/11 0700 In: 1490 [I.V.:1490] Out: 2225 [Urine:2025; Drains:150]   Intake/Output this shift:       Intake/Output      06/10 0701 - 06/11 0700 06/11 0701 - 06/12 0700   I.V. 1490    Total Intake 1490    Urine 2025    Drains 150    Blood 50    Total Output 2225    Net -735            LABORATORY DATA:  Basename 05/02/12 0500 04/26/12 0939  WBC 7.8 5.4  HGB 9.4* 11.4*  HCT 30.7* 35.1*  PLT 252 318    Basename 05/02/12 0500 04/26/12 0939  NA 136 136  K 4.0 3.6  CL 101 102  CO2 29 29  BUN 5* 9  CREATININE 0.67 0.71  GLUCOSE 125* 83  CALCIUM 9.3 10.2   Lab Results   Component Value Date   INR 1.04 04/26/2012   INR 2.1* 07/02/2007   INR 1.8* 07/01/2007    Examination:  General appearance: alert, cooperative and no distress Extremities: Homans sign is negative, no sign of DVT  Wound Exam: clean, dry, intact   Drainage:  None: wound tissue dry  Motor Exam: EHL and FHL Intact  Sensory Exam: Deep Peroneal normal  Vascular Exam:    Assessment:    1 Day Post-Op  Procedure(s) (LRB): TOTAL KNEE REVISION (Right)  ADDITIONAL DIAGNOSIS:  Active Problems:  * No active hospital  problems. *   Acute Blood Loss Anemia   Plan: Physical Therapy as ordered Weight Bearing as Tolerated (WBAT)  DVT Prophylaxis:  Lovenox  DISCHARGE PLAN: Skilled Nursing Facility/Rehab  DISCHARGE NEEDS: HHPT, CPM, Walker and 3-in-1 comode seat         Lorin Gawron 05/02/2012, 8:19 AM

## 2012-05-02 NOTE — Progress Notes (Signed)
CARE MANAGEMENT NOTE 05/02/2012  Patient:  Brenda Murray, Brenda Murray   Account Number:  [id number]  Date Initiated:  05/02/2012  Documentation initiated by:  Vance Peper  Subjective/Objective Assessment:   61 yr old female s/p right total knee arthroplasty.     Action/Plan:   CM spoke with patient and son. She will go to [facility]. Social Worker is aware.   Anticipated DC Date:  05/03/2012   Anticipated DC Plan:  SKILLED NURSING FACILITY  In-house referral  Clinical Social Worker      DC Planning Services  CM consult      Choice offered to / List presented to:             Status of service:  Completed, signed off charge Disposition:  SKILLED NURSING FACILITY

## 2012-05-02 NOTE — Evaluation (Signed)
Physical Therapy Evaluation Patient Details Name: Brenda Murray MRN: [id number] DOB: [birth year] Today's Date: 05/02/2012 Time: 7253-6644 PT Time Calculation (min): 15 min  PT Assessment / Plan / Recommendation Clinical Impression  Pt adm for rt TKA.  Pt very lethargic and difficulty processing probably due to medications.  Recommend ST-SNF which pt plans on.    PT Assessment  Patient needs continued PT services    Follow Up Recommendations  Skilled nursing facility    Barriers to Discharge Decreased caregiver support      lEquipment Recommendations  Defer to next venue    Recommendations for Other Services     Frequency 7X/week    Precautions / Restrictions Precautions Precautions: Knee Restrictions RLE Weight Bearing: Weight bearing as tolerated   Pertinent Vitals/Pain Rt knee 10/10      Mobility  Bed Mobility Bed Mobility: Supine to Sit;Sitting - Scoot to Edge of Bed Supine to Sit: 3: Mod assist;HOB elevated Sitting - Scoot to [place] of Bed: 4: Min assist Details for Bed Mobility Assistance: Verbal/tactile cues for assist Transfers Transfers: Sit to Stand;Stand to Sit;Stand Pivot Transfers Sit to Stand: 1: +2 Total assist;With upper extremity assist;From bed Sit to Stand: Patient Percentage: 70% Stand to Sit: 1: +2 Total assist;To chair/3-in-1 Stand to Sit: Patient Percentage: 60% Stand Pivot Transfers: 1: +2 Total assist Stand Pivot Transfers: Patient Percentage: 60% Details for Transfer Assistance: Pt with great difficulty processing steps needed to turn to chair once standing with walker.  Needed multiple verbal/tactile cues to complete turn to chair.    Exercises Total Joint Exercises Quad Sets: 10 reps;Seated;Right   PT Diagnosis: Difficulty walking;Altered mental status  PT Problem List: Decreased strength;Decreased range of motion;Decreased knowledge of use of DME;Decreased activity tolerance;Decreased balance;Decreased mobility;Decreased knowledge of  precautions;Pain;Decreased cognition PT Treatment Interventions: DME instruction;Gait training;Functional mobility training;Patient/family education;Therapeutic activities;Therapeutic exercise;Balance training   PT Goals Acute Rehab PT Goals PT Goal Formulation: Patient unable to participate in goal setting Time For Goal Achievement: 05/09/12 Potential to Achieve Goals: Good Pt will go Supine/Side to Sit: with supervision PT Goal: Supine/Side to Sit - Progress: Goal set today Pt will go Sit to Supine/Side: with supervision PT Goal: Sit to Supine/Side - Progress: Goal set today Pt will go Sit to Stand: with supervision PT Goal: Sit to Stand - Progress: Goal set today Pt will go Stand to Sit: with supervision PT Goal: Stand to Sit - Progress: Goal set today Pt will Ambulate: 51 - 150 feet;with supervision;with least restrictive assistive device PT Goal: Ambulate - Progress: Goal set today  Visit Information  Last PT Received On: 05/02/12 Assistance Needed: +2    Subjective Data  Subjective: "Sit in the chair til lunch, sit in the chair til lunch, sit in the chair til lunch," pt repeatedly stated after getting up.  Pt very groggy and difficulty processing. Patient Stated Goal: Pt unable to state due to lethargy.   Prior Functioning  Home Living Lives With: Alone Available Help at Discharge: Skilled Nursing Facility Additional Comments: Difficult to assess due to pt unable to answer questions due to impaired cognition. Prior Function Level of Independence: Independent Communication Communication: No difficulties    Cognition  Overall Cognitive Status: Impaired Area of Impairment: Attention;Following commands;Safety/judgement;Awareness of deficits;Problem solving Arousal/Alertness: Lethargic Behavior During Session: Lethargic Current Attention Level: Focused Following Commands: Follows one step commands inconsistently Safety/Judgement: Decreased awareness of safety  precautions;Decreased safety judgement for tasks assessed;Decreased awareness of need for assistance Cognition - Other Comments: cognitive deficits likely due  to pain meds.    Extremity/Trunk Assessment Right Lower Extremity Assessment RLE ROM/Strength/Tone: Deficits RLE ROM/Strength/Tone Deficits: Knee AA/ROM 0-50 degrees.  Fair quad set. Left Lower Extremity Assessment LLE ROM/Strength/Tone: WFL for tasks assessed   Balance    End of Session PT - End of Session Equipment Utilized During Treatment: Gait belt Activity Tolerance: Treatment limited secondary to medication (lethargic) Patient left: in chair;with call bell/phone within reach Nurse Communication: Mobility status;Need for lift equipment (May do better [facility])   [facility] 05/02/2012, 10:31 AM  [facility] PT [phone]

## 2012-05-02 NOTE — Progress Notes (Signed)
UR COMPLETED  

## 2012-05-03 LAB — CBC
MCH: 25.9 pg — ABNORMAL LOW (ref 26.0–34.0)
MCV: 83 fL (ref 78.0–100.0)
Platelets: 232 10*3/uL (ref 150–400)
RDW: 15.1 % (ref 11.5–15.5)

## 2012-05-03 LAB — BASIC METABOLIC PANEL
BUN: 6 mg/dL (ref 6–23)
Calcium: 9.5 mg/dL (ref 8.4–10.5)
Creatinine, Ser: 0.6 mg/dL (ref 0.50–1.10)
GFR calc Af Amer: >90 mL/min (ref >90)

## 2012-05-03 MED FILL — Bupivacaine HCl Preservative Free (PF) Inj 0.25%: INTRAMUSCULAR | Qty: 30 | Status: AC

## 2012-05-03 NOTE — Progress Notes (Signed)
**  LATE ENTRY**  PT Progress Note:   05/02/12 1400  PT Visit Information  Last PT Received On 05/02/12  Assistance Needed +2  PT Time Calculation  PT Start Time 1300  PT Stop Time 1327  PT Time Calculation (min) 27 min  Subjective Data  Subjective "I need to use the bathroom"  Precautions  Precautions Knee  Restrictions  Weight Bearing Restrictions Yes  RLE Weight Bearing WBAT  Cognition  Overall Cognitive Status Impaired  Area of Impairment Attention;Following commands;Safety/judgement;Problem solving  Arousal/Alertness Lethargic  Behavior During Session Lethargic  Current Attention Level Sustained  Following Commands Follows one step commands inconsistently  Safety/Judgement Decreased awareness of safety precautions;Decreased safety judgement for tasks assessed  Cognition - Other Comments cognitive deficits likely due to pain meds.  Bed Mobility  Bed Mobility Supine to Sit;Sit to Supine  Supine to Sit 3: Mod assist;With rails (HOB elevated 30 degrees)  Sit to Supine 1: +2 Total assist;HOB flat  Sit to Supine: Patient Percentage 60%  Details for Bed Mobility Assistance Cues for technique.    Transfers  Transfers Sit to Stand;Stand to Sit;Stand Pivot Transfers  Sit to Stand With upper extremity assist;3: Mod assist;From bed;With armrests;From chair/3-in-1  Stand to Sit 3: Mod assist;With upper extremity assist;4: Min assist;To chair/3-in-1;With armrests  Stand Pivot Transfers 1: +2 Total assist  Stand Pivot Transfers: Patient Percentage 70%  Details for Transfer Assistance Cues for initiation, hand placement, sequencing, technique, body positioning before sitting, & safety.   Pt with difficulty problem solving & slow to process cues.    Exercises  Exercises Total Joint  Total Joint Exercises  Quad Sets AROM;Both;10 reps  Ankle Circles/Pumps AROM;AAROM;Both;10 reps  Heel Slides AAROM;Right;10 reps  Hip ABduction/ADduction AAROM;Right;10 reps  Straight Leg Raises  AAROM;Right;10 reps  PT - End of Session  Activity Tolerance Other (comment) (limited by lethargy)  Patient left in bed;with call bell/phone within reach  PT - Assessment/Plan  Comments on Treatment Session Session cont's to be limited due to pt lethargic & decreased safety awareness.    PT Frequency 7X/week  Follow Up Recommendations Skilled nursing facility  Equipment Recommended Defer to next venue  Acute Rehab PT Goals  Time For Goal Achievement 05/09/12  Potential to Achieve Goals Good  PT Goal: Supine/Side to Sit - Progress Not met  PT Goal: Sit to Supine/Side - Progress Progressing toward goal  PT Goal: Sit to Stand - Progress Progressing toward goal  PT Goal: Stand to Sit - Progress Progressing toward goal  PT General Charges  $$ ACUTE PT VISIT 1 Procedure  PT Treatments  $Therapeutic Exercise 8-22 mins  $Therapeutic Activity 8-22 mins     Verdell Face, [place] [phone] 05/03/2012

## 2012-05-03 NOTE — Progress Notes (Signed)
  Brenda Spurling, MD   [name], PA-C [address] [place], [place], [place]  [address]                             [phone]   PROGRESS NOTE  Subjective:  negative for Chest Pain  negative for Shortness of Breath  negative for Nausea/Vomiting   negative for Calf Pain  negative for Bowel Movement   Tolerating Diet: yes         Patient reports pain as 4 on 0-10 scale.    Objective: Vital signs in last 24 hours:   Patient Vitals for the past 24 hrs:  BP Temp Temp src Pulse Resp SpO2  05/03/12 1356 114/74 mmHg 98.9 F (37.2 C) - 91  18  94 %  05/03/12 1200 - - - - 16  100 %  05/03/12 0800 - - - - 16  94 %  05/03/12 0600 106/70 mmHg 99.2 F (37.3 C) Oral 92  14  92 %  05/02/12 2114 109/58 mmHg 99.5 F (37.5 C) - 90  18  100 %  05/02/12 2000 - - - - 16  96 %    @flow {1959:LAST@   Intake/Output from previous day:   06/11 0701 - 06/12 0700 In: 1710 [P.O.:960; I.V.:600] Out: 1075 [Urine:1075]   Intake/Output this shift:   06/12 0701 - 06/12 1900 In: 600 [P.O.:600] Out: -    Intake/Output      06/11 0701 - 06/12 0700 06/12 0701 - 06/13 0700   P.O. 960 600   I.V. 600    IV Piggyback 150    Total Intake 1710 600   Urine 1075    Drains     Blood     Total Output 1075    Net +635 +600        Urine Occurrence 7 x 1 x      LABORATORY DATA:  Basename 05/03/12 0535 05/02/12 0500  WBC 8.4 7.8  HGB 8.4* 9.4*  HCT 26.9* 30.7*  PLT 232 252    Basename 05/03/12 0535 05/02/12 0500  NA 137 136  K 3.8 4.0  CL 103 101  CO2 29 29  BUN 6 5*  CREATININE 0.60 0.67  GLUCOSE 118* 125*  CALCIUM 9.5 9.3   Lab Results  Component Value Date   INR 1.04 04/26/2012   INR 2.1* 07/02/2007   INR 1.8* 07/01/2007    Examination:  General appearance: alert, cooperative and no distress Extremities: Homans sign is negative, no sign of DVT  Wound Exam: clean, dry, intact   Drainage:  None: wound tissue dry  Motor Exam: EHL and FHL Intact  Sensory Exam: Deep Peroneal  normal  Vascular Exam:    Assessment:    2 Days Post-Op  Procedure(s) (LRB): TOTAL KNEE REVISION (Right)  ADDITIONAL DIAGNOSIS:  Active Problems:  * No active hospital problems. *   Acute Blood Loss Anemia   Plan: Physical Therapy as ordered Weight Bearing as Tolerated (WBAT)  DVT Prophylaxis:  Lovenox  DISCHARGE PLAN: Skilled Nursing Facility/Rehab  DISCHARGE NEEDS: HHPT, CPM, Walker and 3-in-1 comode seat         Rubby Barbary 05/03/2012, 3:28 PM

## 2012-05-04 LAB — CBC
MCHC: 31.4 g/dL (ref 30.0–36.0)
MCV: 82.7 fL (ref 78.0–100.0)
Platelets: 253 10*3/uL (ref 150–400)
RDW: 15.1 % (ref 11.5–15.5)
WBC: 8.2 10*3/uL (ref 4.0–10.5)

## 2012-05-04 LAB — BASIC METABOLIC PANEL
Chloride: 99 mEq/L (ref 96–112)
Creatinine, Ser: 0.7 mg/dL (ref 0.50–1.10)
GFR calc Af Amer: >90 mL/min (ref >90)
GFR calc non Af Amer: >90 mL/min (ref >90)
Potassium: 3.7 mEq/L (ref 3.5–5.1)

## 2012-05-04 MED ORDER — METHOCARBAMOL 500 MG PO TABS: 500.0000 mg | ORAL_TABLET | Freq: Four times a day (QID) | ORAL | Status: AC | PRN

## 2012-05-04 MED ORDER — HYDROCODONE-ACETAMINOPHEN 5-325 MG PO TABS: 1.0000 | ORAL_TABLET | ORAL | Status: AC | PRN

## 2012-05-04 MED ORDER — ENOXAPARIN SODIUM 40 MG/0.4ML ~~LOC~~ SOLN: 40.0000 mg | Freq: Two times a day (BID) | SUBCUTANEOUS | Status: DC

## 2012-05-04 NOTE — Discharge Instructions (Signed)
Diet: As you were doing prior to hospitalization   Activity:  Increase activity slowly as tolerated                  No lifting or driving for 6 weeks  Shower:  May shower without a dressing once there is no drainage from your wound.                 Do NOT wash over the wound.  If drainage remains, cover wound with saran                  Wrap and then shower.  Clean incision with betadine and change dressing                        After saran wrap removed.  Dressing:  You may change your dressing on Friday                    Then change the dressing daily with sterile 4"x4"s gauze dressing                     And TED hose for knees.  Use paper tape to hold dressing in place                     For hips.  You may clean the incision with alcohol prior to redressing.  Weight Bearing:  Weight bearing as tolerated as taught in physical therapy.  Use a                                walker or Crutches as instructed.  To prevent constipation: you may use a stool softener such as -               Colace ( over the counter) 100 mg by mouth twice a day                Drink plenty of fluids ( prune juice may be helpful) and high fiber foods                Miralax ( over the counter) for constipation as needed.    Precautions:  If you experience chest pain or shortness of breath - call 911 immediately               For transfer to the hospital emergency department!!               If you develop a fever greater that 101 F, purulent drainage from wound,                             increased redness or drainage from wound, or calf pain -- Call the office at                                                 [phone].  Follow- Up Appointment:  Please call for an appointment to be seen on 05/16/12  [place] - [phone]                   

## 2012-05-04 NOTE — Progress Notes (Signed)
  Georgena Spurling, MD   [name], PA-C [address] [place], [place], [place]  [address]                             [phone]   PROGRESS NOTE  Subjective:  negative for Chest Pain  negative for Shortness of Breath  negative for Nausea/Vomiting   negative for Calf Pain  negative for Bowel Movement   Tolerating Diet: yes         Patient reports pain as 5 on 0-10 scale.    Objective: Vital signs in last 24 hours:   Patient Vitals for the past 24 hrs:  BP Temp Temp src Pulse Resp SpO2  05/04/12 0600 129/82 mmHg 99 F (37.2 C) Oral 102  18  98 %  05/04/12 0400 - - - - 18  -  05/04/12 0000 - - - - 18  -  05/03/12 2200 115/74 mmHg 99.2 F (37.3 C) Oral 102  20  94 %  05/03/12 1356 114/74 mmHg 98.9 F (37.2 C) - 91  18  94 %  05/03/12 1200 - - - - 16  100 %    @flow {1959:LAST@   Intake/Output from previous day:   06/12 0701 - 06/13 0700 In: 1080 [P.O.:1080] Out: -    Intake/Output this shift:       Intake/Output      06/12 0701 - 06/13 0700 06/13 0701 - 06/14 0700   P.O. 1080    I.V.     IV Piggyback     Total Intake 1080    Urine     Total Output     Net +1080         Urine Occurrence 5 x       LABORATORY DATA:  Basename 05/04/12 0515 05/03/12 0535 05/02/12 0500  WBC 8.2 8.4 7.8  HGB 8.7* 8.4* 9.4*  HCT 27.7* 26.9* 30.7*  PLT 253 232 252    Basename 05/04/12 0515 05/03/12 0535 05/02/12 0500  NA 135 137 136  K 3.7 3.8 4.0  CL 99 103 101  CO2 29 29 29   BUN 6 6 5*  CREATININE 0.70 0.60 0.67  GLUCOSE 101* 118* 125*  CALCIUM 10.0 9.5 9.3   Lab Results  Component Value Date   INR 1.04 04/26/2012   INR 2.1* 07/02/2007   INR 1.8* 07/01/2007    Examination:  General appearance: alert, cooperative and no distress Extremities: Homans sign is negative, no sign of DVT  Wound Exam: clean, dry, intact   Drainage:  None: wound tissue dry  Motor Exam: EHL and FHL Intact  Sensory Exam: Deep Peroneal normal  Vascular Exam:    Assessment:    3  Days Post-Op  Procedure(s) (LRB): TOTAL KNEE REVISION (Right)  ADDITIONAL DIAGNOSIS:  Active Problems:  * No active hospital problems. *   Acute Blood Loss Anemia   Plan: Physical Therapy as ordered Weight Bearing as Tolerated (WBAT)  DVT Prophylaxis:  Lovenox  DISCHARGE PLAN: Skilled Nursing Facility/Rehab  DISCHARGE NEEDS: HHPT, CPM, Walker and 3-in-1 comode seat         Savas Elvin 05/04/2012, 11:50 AM

## 2012-05-04 NOTE — Discharge Summary (Signed)
Brenda Spurling, MD   Brenda Cabal, PA-C [address] [place], [place], [place]  [address]                             [phone]  PATIENT ID: Brenda Murray        MRN:  [id number]          DOB/AGE: Jan 20, 1951 / 61 y.o.    DISCHARGE SUMMARY  ADMISSION DATE:    05/01/2012 DISCHARGE DATE:   05/04/2012   ADMISSION DIAGNOSIS: failed right total knee/loosening    DISCHARGE DIAGNOSIS:  failed right total knee/loosening    ADDITIONAL DIAGNOSIS: Active Problems:  * No active hospital problems. *   Past Medical History  Diagnosis Date  . Anxiety   . Hypothyroidism   . Bipolar disorder   . History of indigestion     PROCEDURE: Procedure(s): TOTAL KNEE REVISION on 05/01/2012  CONSULTS:     HISTORY:  See H&P in chart  HOSPITAL COURSE:  Brenda Murray is a 61 y.o. admitted on 05/01/2012 and found to have a diagnosis of failed right total knee/loosening.  After appropriate laboratory studies were obtained  they were taken to the operating room on 05/01/2012 and underwent Procedure(s): TOTAL KNEE REVISION.   They were given perioperative antibiotics:  Anti-infectives     Start     Dose/Rate Route Frequency Ordered Stop   05/01/12 1630   ceFAZolin (ANCEF) IVPB 1 g/50 mL premix        1 g 100 mL/hr over 30 Minutes Intravenous Every 6 hours 05/01/12 1532 05/05/12 1119   05/01/12 0824   ceFAZolin (ANCEF) 2-3 GM-% IVPB SOLR     Comments: MICHAEL, CYNTHIA: cabinet override         05/01/12 0824 05/01/12 2029   04/30/12 1220   ceFAZolin (ANCEF) IVPB 2 g/50 mL premix        2 g 100 mL/hr over 30 Minutes Intravenous 60 min pre-op 04/30/12 1220 05/01/12 1030        .  Tolerated the procedure well.  Placed with a foley intraoperatively.  Given Ofirmev at induction and for 48 hours.    POD #1, allowed out of bed to a chair.  PT for ambulation and exercise program.  Foley D/C'd in morning.  IV saline locked.  O2 discontionued.  POD #2, continued PT and ambulation.   Hemovac  pulled.  The remainder of the hospital course was dedicated to ambulation and strengthening.   The patient was discharged on 3 Days Post-Op in  Good condition.  Blood products given:none  DIAGNOSTIC STUDIES: Recent vital signs: Patient Vitals for the past 24 hrs:  BP Temp Temp src Pulse Resp SpO2  05/04/12 0600 129/82 mmHg 99 F (37.2 C) Oral 102  18  98 %  05/04/12 0400 - - - - 18  -  05/04/12 0000 - - - - 18  -  05-May-2012 2200 115/74 mmHg 99.2 F (37.3 C) Oral 102  20  94 %  May 05, 2012 1356 114/74 mmHg 98.9 F (37.2 C) - 91  18  94 %  05/05/12 1200 - - - - 16  100 %       Recent laboratory studies:  Basename 05/04/12 0515 05/05/2012 0535 05/02/12 0500  WBC 8.2 8.4 7.8  HGB 8.7* 8.4* 9.4*  HCT 27.7* 26.9* 30.7*  PLT 253 232 252    Basename 05/04/12 0515 2012-05-05 0535 05/02/12 0500  NA  135 137 136  K 3.7 3.8 4.0  CL 99 103 101  CO2 29 29 29   BUN 6 6 5*  CREATININE 0.70 0.60 0.67  GLUCOSE 101* 118* 125*  CALCIUM 10.0 9.5 9.3   Lab Results  Component Value Date   INR 1.04 04/26/2012   INR 2.1* 07/02/2007   INR 1.8* 07/01/2007     Recent Radiographic Studies :  Dg Chest 2 View  04/26/2012  *RADIOLOGY REPORT*  Clinical Data: Preoperative assessment for right total knee revision  CHEST - 2 VIEW  Comparison: 09/05/2011  Findings: Normal heart size, mediastinal contours, and pulmonary vascularity. Chronic bronchitic changes and accentuation of perihilar markings, stable. Focal area of increased markings in the right upper lobe appears stable since 06/26/2007 exam question area of scarring. Additional area of increased markings at the left lung base adjacent to the left heart border also appears little changed, question scarring. Remaining lungs clear. No pleural effusion or pneumothorax. No acute osseous findings.  IMPRESSION: Mild chronic bronchitic changes. Chronic lung changes including a vague density in the right upper lobe likely scarring, not significantly changed since  06/26/2007. No acute abnormalities.  Original Report Authenticated By: Lollie Marrow, M.D.    DISCHARGE INSTRUCTIONS: Discharge Orders    Future Orders Please Complete By Expires   Diet - low sodium heart healthy      Call MD / Call 911      Comments:   If you experience chest pain or shortness of breath, CALL 911 and be transported to the hospital emergency room.  If you develope a fever above 101 F, pus (white drainage) or increased drainage or redness at the wound, or calf pain, call your surgeon's office.   Constipation Prevention      Comments:   Drink plenty of fluids.  Prune juice may be helpful.  You may use a stool softener, such as Colace (over the counter) 100 mg twice a day.  Use MiraLax (over the counter) for constipation as needed.   Increase activity slowly as tolerated      Driving restrictions      Comments:   No driving for 6 weeks   Lifting restrictions      Comments:   No lifting for 6 weeks   CPM      Comments:   Continuous passive motion machine (CPM):      Use the CPM from 0 to 90 for 6-8 hours per day.      You may increase by 10 per day.  You may break it up into 2 or 3 sessions per day.      Use CPM for 2 weeks or until you are told to stop.   TED hose      Comments:   Use stockings (TED hose) for 3 weeks on both leg(s).  You may remove them at night for sleeping.   Change dressing      Comments:   Change dressing on friday, then change the dressing daily with sterile 4 x 4 inch gauze dressing and apply TED hose.  You may clean the incision with alcohol prior to redressing.   Do not put a pillow under the knee. Place it under the heel.         DISCHARGE MEDICATIONS:   Medication List  As of 05/04/2012 11:58 AM   STOP taking these medications         HYDROcodone-acetaminophen 5-500 MG per tablet  TAKE these medications         acetaminophen 650 MG CR tablet   Commonly known as: TYLENOL   Take 650 mg by mouth every 8 (eight) hours as  needed. For pain      clonazePAM 0.5 MG tablet   Commonly known as: KLONOPIN   Take 0.5-1 mg by mouth QID. Pt takes 1 tab 3 times daily. 2 tabs at bedtime      enoxaparin 40 MG/0.4ML injection   Commonly known as: LOVENOX   Inject 0.4 mLs (40 mg total) into the skin every 12 (twelve) hours.      HYDROcodone-acetaminophen 5-325 MG per tablet   Commonly known as: NORCO   Take 1-2 tablets by mouth every 4 (four) hours as needed.      levothyroxine 112 MCG tablet   Commonly known as: SYNTHROID, LEVOTHROID   Take 112 mcg by mouth daily.      lithium 300 MG tablet   Take 600 mg by mouth 2 (two) times daily.      methocarbamol 500 MG tablet   Commonly known as: ROBAXIN   Take 1-2 tablets (500-1,000 mg total) by mouth every 6 (six) hours as needed.      naproxen sodium 220 MG tablet   Commonly known as: ANAPROX   Take 220 mg by mouth 2 (two) times daily with a meal.      OLANZapine zydis 10 MG disintegrating tablet   Commonly known as: ZYPREXA   Take 20 mg by mouth at bedtime.      vitamin C 500 MG tablet   Commonly known as: ASCORBIC ACID   Take 500 mg by mouth daily.            FOLLOW UP VISIT:   Follow-up Information    Follow up with Raymon Mutton, MD. Call on 05/16/2012.   Contact information:   [address] [employer] [place] [address] [phone]          DISPOSITION:  Skilled nursing facility    CONDITION:  Good   Elton Heid 05/04/2012, 11:58 AM

## 2012-05-04 NOTE — Progress Notes (Signed)
PT Progress Note:     05/04/12 1000  PT Visit Information  Last PT Received On 05/04/12  Assistance Needed +1  PT Time Calculation  PT Start Time 0758  PT Stop Time 0825  PT Time Calculation (min) 27 min  Precautions  Precautions Knee  Restrictions  RLE Weight Bearing WBAT  Cognition  Overall Cognitive Status History of cognitive impairments - at baseline  Area of Impairment Memory;Problem solving;Other (comment) (? due to medication? )  Arousal/Alertness (awake/alert but sleepy)  Orientation Level Oriented X4 / Intact  Following Commands Follows one step commands consistently  Bed Mobility  Bed Mobility Not assessed  Transfers  Transfers Sit to Stand;Stand to Sit  Sit to Stand 4: Min assist;With upper extremity assist;With armrests;From chair/3-in-1  Stand to Sit 4: Min guard;With upper extremity assist;With armrests;To chair/3-in-1  Details for Transfer Assistance Pt required minor assist to achieve standing & for balance.  Cues for safe technique & body positioning before sitting.    Ambulation/Gait  Ambulation/Gait Assistance 4: Min guard  Ambulation Distance (Feet) 50 Feet  Assistive device Rolling walker  Ambulation/Gait Assistance Details Cues for safety, tall posture, safe RW management/advancement, sequencing.   Encouragement to increase distance.  Pt reports fatigue is limiting factor rather than pain.    Gait Pattern Decreased step length - right;Decreased step length - left;Decreased hip/knee flexion - right;Decreased hip/knee flexion - left  General Gait Details pt with small, shuffle like steps.   Stairs No  Engineering geologist No  Balance  Balance Assessed No  Exercises  Exercises Total Joint  Total Joint Exercises  Quad Sets AROM;Both;15 reps  Ankle Circles/Pumps AROM;Both;15 reps  Straight Leg Raises AAROM;Right;Other reps (comment) (12 reps)  [employer];Right;Other reps (comment) (12 reps)  Knee Flexion  AAROM;Right;Other reps (comment) (12 reps)  PT - End of Session  Equipment Utilized During Treatment Gait belt  Activity Tolerance Patient limited by fatigue;Patient tolerated treatment well  Patient left in chair;with call bell/phone within reach  Nurse Communication Mobility status  PT - Assessment/Plan  PT Plan Discharge plan remains appropriate  PT Frequency 7X/week  Follow Up Recommendations Skilled nursing facility  Equipment Recommended Defer to next venue  Acute Rehab PT Goals  Time For Goal Achievement 05/09/12  Potential to Achieve Goals Good  PT Goal: Sit to Stand - Progress Progressing toward goal  PT Goal: Stand to Sit - Progress Progressing toward goal  PT Goal: Ambulate - Progress Progressing toward goal  PT General Charges  $$ ACUTE PT VISIT 1 Procedure  PT Treatments  $Gait Training 8-22 mins  $Therapeutic Exercise 8-22 mins     Pain: 6/10 knee.  Premedicated per pt.     Verdell Face, [place] [phone] 05/04/2012

## 2012-05-05 LAB — BASIC METABOLIC PANEL
CO2: 28 mEq/L (ref 19–32)
Calcium: 10.3 mg/dL (ref 8.4–10.5)
Creatinine, Ser: 0.68 mg/dL (ref 0.50–1.10)
GFR calc Af Amer: >90 mL/min (ref >90)
Sodium: 138 mEq/L (ref 135–145)

## 2012-05-05 NOTE — Progress Notes (Signed)
  Georgena Spurling, MD   [name], PA-C [address] [place], [place], [place]  [address]                             [phone]   PROGRESS NOTE  Subjective:  negative for Chest Pain  negative for Shortness of Breath  negative for Nausea/Vomiting   negative for Calf Pain  negative for Bowel Movement   Tolerating Diet: yes         Patient reports pain as 5 on 0-10 scale.    Objective: Vital signs in last 24 hours:   Patient Vitals for the past 24 hrs:  BP Temp Temp src Pulse Resp SpO2  05/05/12 0632 137/86 mmHg 98.5 F (36.9 C) Oral 89  18  97 %  05/04/12 2201 94/64 mmHg 99.6 F (37.6 C) Oral 102  17  93 %  05/04/12 1600 - - - - 14  95 %  05/04/12 1533 126/78 mmHg 97.6 F (36.4 C) - 76  20  95 %    @flow {1959:LAST@   Intake/Output from previous day:   06/13 0701 - 06/14 0700 In: 990 [P.O.:990] Out: -    Intake/Output this shift:   06/14 0701 - 06/14 1900 In: 480 [P.O.:480] Out: -    Intake/Output      06/13 0701 - 06/14 0700 06/14 0701 - 06/15 0700   P.O. 990 480   Total Intake 990 480   Net +990 +480        Urine Occurrence 5 x 1 x      LABORATORY DATA:  Basename 05/04/12 0515 05/03/12 0535 05/02/12 0500  WBC 8.2 8.4 7.8  HGB 8.7* 8.4* 9.4*  HCT 27.7* 26.9* 30.7*  PLT 253 232 252    Basename 05/05/12 0500 05/04/12 0515 05/03/12 0535 05/02/12 0500  NA 138 135 137 136  K 4.0 3.7 3.8 4.0  CL 102 99 103 101  CO2 28 29 29 29   BUN 5* 6 6 5*  CREATININE 0.68 0.70 0.60 0.67  GLUCOSE 120* 101* 118* 125*  CALCIUM 10.3 10.0 9.5 9.3   Lab Results  Component Value Date   INR 1.04 04/26/2012   INR 2.1* 07/02/2007   INR 1.8* 07/01/2007    Examination:  General appearance: alert, cooperative and no distress Extremities: Homans sign is negative, no sign of DVT  Wound Exam: clean, dry, intact   Drainage:  None: wound tissue dry  Motor Exam: EHL and FHL Intact  Sensory Exam: Deep Peroneal normal  Vascular Exam:    Assessment:    4 Days Post-Op   Procedure(s) (LRB): TOTAL KNEE REVISION (Right)  ADDITIONAL DIAGNOSIS:  Active Problems:  * No active hospital problems. *   Acute Blood Loss Anemia   Plan: Physical Therapy as ordered Weight Bearing as Tolerated (WBAT)  DVT Prophylaxis:  Lovenox  DISCHARGE PLAN: skilled nursing facility  DISCHARGE NEEDS: HHPT, CPM, Walker and 3-in-1 comode seat       no change in discharge summary  Shaquill Iseman 05/05/2012, 1:22 PM

## 2012-05-05 NOTE — Clinical Social Work Psychosocial (Addendum)
    Clinical Social Work Department BRIEF PSYCHOSOCIAL ASSESSMENT 05/05/2012  Patient:  Brenda Murray, Brenda Murray     Account Number:  [id number]     Admit date:  05/01/2012  Clinical Social Worker:  Burnard Hawthorne  Date/Time:  05/03/2012 01:30 PM  Referred by:  Physician  Date Referred:  05/02/2012 Referred for  SNF Placement   Other Referral:   Interview type:  Other - See comment Other interview type:   Met with patient in person; spoke with her son Kandee Keen by telephone    PSYCHOSOCIAL DATA Living Status:  ALONE Admitted from facility:   Level of care:   Primary support name:  [place] Primary support relationship to patient:  CHILD, ADULT Degree of support available:   Son is EXTREMELY supportive    CURRENT CONCERNS Current Concerns  Post-Acute Placement   Other Concerns:   Patient is bipolar; has current psych history. Will require 30 day PASARR screen    SOCIAL WORK ASSESSMENT / PLAN CSW referred to see this very pleasant lady for short term SNF placement.  She defers most things to her son- Denyse Amass but states that she is agreeable to short term SNF. She would like to go to [facility] since her insurance will not cover [facility] which was her first choice.   Assessment/plan status:  Psychosocial Support/Ongoing Assessment of Needs Other assessment/ plan:   Information/referral to community resources:   SNF list  Discussed after care from SNF- DME/HH needs    PATIENT'S/FAMILY'S RESPONSE TO PLAN OF CARE: Patient and son are very happy with SNF plan for d/c.

## 2012-05-05 NOTE — Clinical Social Work Placement (Addendum)
    Clinical Social Work Department CLINICAL SOCIAL WORK PLACEMENT NOTE 05/05/2012  Patient:  Brenda Murray, Brenda Murray  Account Number:  [id number] Admit date:  05/01/2012  Clinical Social Worker:  Lupita Leash Mikayah Joy, BSW  Date/time:  05/03/2012 01:30 PM  Clinical Social Work is seeking post-discharge placement for this patient at the following level of care:   SKILLED NURSING   (*CSW will update this form in Epic as items are completed)   05/03/2012  Patient/family provided with [facility] Department of Clinical Social Work's list of facilities offering this level of care within the geographic area requested by the patient (or if unable, by the patient's family).  05/03/2012  Patient/family informed of their freedom to choose among providers that offer the needed level of care, that participate in Medicare, Medicaid or managed care program needed by the patient, have an available bed and are willing to accept the patient.  05/03/2012  Patient/family informed of [facility]' ownership interest in [facility], as well as of the fact that they are under no obligation to receive care at this facility.  PASARR submitted to [employer] on 05/03/2012 PASARR number received from [employer] on 05/04/2012  FL2 transmitted to all facilities in geographic area requested by pt/family on  05/03/2012 FL2 transmitted to all facilities within larger geographic area on   Patient informed that his/her managed care company has contracts with or will negotiate with  certain facilities, including the following:   [employer].  Patient requireed 30 day PASARR screen     Patient/family informed of bed offers received:  05/04/2012 Patient chooses bed at [facility] Physician recommends and patient chooses bed at    Patient to be transferred to [facility] on  05/05/2012 Patient to be transferred to facility by [facility]  The following physician request were entered in Epic:   Additional Comments: Patient and son  Brenda Murray were very pleased with dc plan.  Son related that the process for placement went very smoothly. Notified SNF and pt's nurse of d/c plan.

## 2012-05-05 NOTE — Progress Notes (Signed)
Physical Therapy Treatment Patient Details Name: Brenda Murray MRN: [id number] DOB: 1951-02-20 Today's Date: 05/05/2012 Time: 6295-2841 PT Time Calculation (min): 16 min  PT Assessment / Plan / Recommendation Comments on Treatment Session  Pt admitted s/p right TKA and continues to progress.  Pt tolerated therapeutic exercises this pm even though very lethargic.      Follow Up Recommendations  Skilled nursing facility    Barriers to Discharge        Equipment Recommendations  Defer to next venue    Recommendations for Other Services    Frequency 7X/week   Plan Discharge plan remains appropriate;Frequency remains appropriate    Precautions / Restrictions Precautions Precautions: Knee Precaution Booklet Issued: No Restrictions Weight Bearing Restrictions: Yes RLE Weight Bearing: Weight bearing as tolerated   Pertinent Vitals/Pain 6/10 in right knee.  Pt repositioned and RN aware.    Mobility  Bed Mobility Bed Mobility: Not assessed Transfers Transfers: Sit to Stand;Stand to Sit (2 trials.) Sit to Stand: 4: Min guard;With upper extremity assist;From chair/3-in-1 Stand to Sit: 4: Min guard;With upper extremity assist;To chair/3-in-1 Details for Transfer Assistance: Guarding for balance with cues for safe hand/right LE placement. Ambulation/Gait Ambulation/Gait Assistance: 4: Min guard Ambulation Distance (Feet): 5 Feet Assistive device: Rolling walker Ambulation/Gait Assistance Details: Guarding for balance with cues for sequence. Gait Pattern: Step-to pattern;Decreased step length - right;Decreased stance time - right;Trunk flexed Stairs: No Wheelchair Mobility Wheelchair Mobility: No    Exercises Total Joint Exercises Ankle Circles/Pumps: AAROM;Right;15 reps;Seated Quad Sets: AAROM;Right;15 reps;Seated Short Arc Quad: AAROM;Right;15 reps;Seated Heel Slides: AAROM;Right;15 reps;Seated Hip ABduction/ADduction: AAROM;Right;15 reps;Seated Straight Leg Raises:  AAROM;Right;15 reps;Seated   PT Diagnosis:    PT Problem List:   PT Treatment Interventions:     PT Goals Acute Rehab PT Goals PT Goal Formulation: Patient unable to participate in goal setting Time For Goal Achievement: 05/09/12 Potential to Achieve Goals: Good PT Goal: Sit to Stand - Progress: Progressing toward goal PT Goal: Stand to Sit - Progress: Progressing toward goal PT Goal: Ambulate - Progress: Progressing toward goal Pt will Perform Home Exercise Program: with supervision, verbal cues required/provided PT Goal: Perform Home Exercise Program - Progress: Goal set today  Visit Information  Last PT Received On: 05/05/12 Assistance Needed: +1    Subjective Data  Subjective: "I am sleepy."   Cognition  Overall Cognitive Status: History of cognitive impairments - at baseline Area of Impairment: Memory;Problem solving Arousal/Alertness: Lethargic Orientation Level: Oriented X4 / Intact Behavior During Session: Lethargic    Balance  Balance Balance Assessed: No  End of Session PT - End of Session Activity Tolerance: Patient tolerated treatment well Patient left: in chair;with call bell/phone within reach Nurse Communication: Mobility status    Cephus Shelling 05/05/2012, 1:41 PM  05/05/2012 Cephus Shelling, PT, DPT [phone]

## 2012-05-05 NOTE — Progress Notes (Signed)
Physical Therapy Treatment Patient Details Name: Brenda Murray MRN: [id number] DOB: 20-Mar-1951 Today's Date: 05/05/2012 Time: 4098-1191 PT Time Calculation (min): 27 min  PT Assessment / Plan / Recommendation Comments on Treatment Session  Pt admitted s/p right TKA and continues to progress.  Pt very motivated and able to increase ambulation distance as well as independence today.      Follow Up Recommendations  Skilled nursing facility    Barriers to Discharge        Equipment Recommendations  Defer to next venue    Recommendations for Other Services    Frequency 7X/week   Plan Discharge plan remains appropriate;Frequency remains appropriate    Precautions / Restrictions Precautions Precautions: Knee Restrictions Weight Bearing Restrictions: Yes RLE Weight Bearing: Weight bearing as tolerated   Pertinent Vitals/Pain 7/10 in right knee.  Pt repositioned.    Mobility  Bed Mobility Bed Mobility: Not assessed Transfers Transfers: Sit to Stand;Stand to Sit Sit to Stand: 4: Min guard;With upper extremity assist;From chair/3-in-1 Stand to Sit: 4: Min guard;With upper extremity assist;To chair/3-in-1 Details for Transfer Assistance: Guarding for balance with cues for safest hand/right LE placement. Ambulation/Gait Ambulation/Gait Assistance: 4: Min guard Ambulation Distance (Feet): 120 Feet Assistive device: Rolling walker Ambulation/Gait Assistance Details: Guarding for balance with cues for tall posture, step-through sequence, initial contact on right heel, and increased step length. Gait Pattern: Step-to pattern;Step-through pattern;Decreased step length - right;Decreased stance time - right;Trunk flexed Stairs: No Wheelchair Mobility Wheelchair Mobility: No    Exercises Total Joint Exercises Heel Slides: AAROM;Right;5 reps;Seated Goniometric ROM: AA/ROM right knee 0-45 degrees.   PT Diagnosis:    PT Problem List:   PT Treatment Interventions:     PT Goals Acute  Rehab PT Goals PT Goal Formulation: Patient unable to participate in goal setting Time For Goal Achievement: 05/09/12 Potential to Achieve Goals: Good PT Goal: Sit to Stand - Progress: Progressing toward goal PT Goal: Stand to Sit - Progress: Progressing toward goal PT Goal: Ambulate - Progress: Progressing toward goal  Visit Information  Last PT Received On: 05/05/12 Assistance Needed: +1    Subjective Data  Subjective: "I am up for some walking."   Cognition  Overall Cognitive Status: History of cognitive impairments - at baseline Area of Impairment: Memory;Problem solving Arousal/Alertness: Awake/alert Orientation Level: Oriented X4 / Intact Behavior During Session: [facility] for tasks performed    Balance  Balance Balance Assessed: No  End of Session PT - End of Session Equipment Utilized During Treatment: Gait belt Activity Tolerance: Patient tolerated treatment well Patient left: in chair;with call bell/phone within reach Nurse Communication: Mobility status CPM Right Knee CPM Right Knee: Off    Cephus Shelling 05/05/2012, 9:46 AM  05/05/2012 Cephus Shelling, PT, DPT [phone]

## 2012-05-22 ENCOUNTER — Telehealth: Payer: Self-pay | Admitting: Obstetrics and Gynecology

## 2012-05-22 NOTE — Telephone Encounter (Signed)
Breast letter/epic

## 2012-05-23 NOTE — Telephone Encounter (Signed)
Pt with questions about dense breast tissue.  Notified pt that mmg was normal but letter was to make pt aware of dense tissue.  ld

## 2012-11-05 ENCOUNTER — Emergency Department (HOSPITAL_COMMUNITY)
Admission: EM | Admit: 2012-11-05 | Discharge: 2012-11-05 | Disposition: A | Discharge status: Home / Self Care | Payer: Medicare PPO | Attending: Emergency Medicine | Admitting: Emergency Medicine

## 2012-11-05 ENCOUNTER — Encounter (HOSPITAL_COMMUNITY): Payer: Self-pay | Admitting: *Deleted

## 2012-11-05 DIAGNOSIS — Z8719 Personal history of other diseases of the digestive system: Secondary | ICD-10-CM | POA: Insufficient documentation

## 2012-11-05 DIAGNOSIS — R5381 Other malaise: Secondary | ICD-10-CM | POA: Insufficient documentation

## 2012-11-05 DIAGNOSIS — K625 Hemorrhage of anus and rectum: Secondary | ICD-10-CM | POA: Insufficient documentation

## 2012-11-05 DIAGNOSIS — R5383 Other fatigue: Secondary | ICD-10-CM | POA: Insufficient documentation

## 2012-11-05 DIAGNOSIS — K921 Melena: Secondary | ICD-10-CM | POA: Insufficient documentation

## 2012-11-05 DIAGNOSIS — T56891A Toxic effect of other metals, accidental (unintentional), initial encounter: Secondary | ICD-10-CM

## 2012-11-05 DIAGNOSIS — T56894A Toxic effect of other metals, undetermined, initial encounter: Secondary | ICD-10-CM | POA: Insufficient documentation

## 2012-11-05 DIAGNOSIS — F411 Generalized anxiety disorder: Secondary | ICD-10-CM | POA: Insufficient documentation

## 2012-11-05 DIAGNOSIS — Y939 Activity, unspecified: Secondary | ICD-10-CM | POA: Insufficient documentation

## 2012-11-05 DIAGNOSIS — T65891A Toxic effect of other specified substances, accidental (unintentional), initial encounter: Secondary | ICD-10-CM | POA: Insufficient documentation

## 2012-11-05 DIAGNOSIS — Z79899 Other long term (current) drug therapy: Secondary | ICD-10-CM | POA: Insufficient documentation

## 2012-11-05 DIAGNOSIS — M79609 Pain in unspecified limb: Secondary | ICD-10-CM | POA: Insufficient documentation

## 2012-11-05 DIAGNOSIS — K649 Unspecified hemorrhoids: Secondary | ICD-10-CM

## 2012-11-05 DIAGNOSIS — E039 Hypothyroidism, unspecified: Secondary | ICD-10-CM | POA: Insufficient documentation

## 2012-11-05 DIAGNOSIS — Z87891 Personal history of nicotine dependence: Secondary | ICD-10-CM | POA: Insufficient documentation

## 2012-11-05 DIAGNOSIS — K6289 Other specified diseases of anus and rectum: Secondary | ICD-10-CM | POA: Insufficient documentation

## 2012-11-05 DIAGNOSIS — F319 Bipolar disorder, unspecified: Secondary | ICD-10-CM | POA: Insufficient documentation

## 2012-11-05 DIAGNOSIS — M549 Dorsalgia, unspecified: Secondary | ICD-10-CM | POA: Insufficient documentation

## 2012-11-05 DIAGNOSIS — Y929 Unspecified place or not applicable: Secondary | ICD-10-CM | POA: Insufficient documentation

## 2012-11-05 DIAGNOSIS — R197 Diarrhea, unspecified: Secondary | ICD-10-CM | POA: Insufficient documentation

## 2012-11-05 DIAGNOSIS — R42 Dizziness and giddiness: Secondary | ICD-10-CM | POA: Insufficient documentation

## 2012-11-05 DIAGNOSIS — E860 Dehydration: Secondary | ICD-10-CM

## 2012-11-05 DIAGNOSIS — R112 Nausea with vomiting, unspecified: Secondary | ICD-10-CM | POA: Insufficient documentation

## 2012-11-05 DIAGNOSIS — R1110 Vomiting, unspecified: Secondary | ICD-10-CM

## 2012-11-05 LAB — CBC
HCT: 32.6 % — ABNORMAL LOW (ref 36.0–46.0)
Hemoglobin: 10.3 g/dL — ABNORMAL LOW (ref 12.0–15.0)
MCH: 25.1 pg — ABNORMAL LOW (ref 26.0–34.0)
MCHC: 31.6 g/dL (ref 30.0–36.0)
MCV: 79.5 fL (ref 78.0–100.0)
Platelets: 293 10*3/uL (ref 150–400)
RBC: 4.1 MIL/uL (ref 3.87–5.11)
RDW: 15.7 % — ABNORMAL HIGH (ref 11.5–15.5)
WBC: 5.5 10*3/uL (ref 4.0–10.5)

## 2012-11-05 LAB — CBC WITH DIFFERENTIAL/PLATELET
Eosinophils Absolute: 0.1 10*3/uL (ref 0.0–0.7)
Eosinophils Relative: 1 % (ref 0–5)
HCT: 34.9 % — ABNORMAL LOW (ref 36.0–46.0)
Lymphocytes Relative: 14 % (ref 12–46)
Lymphs Abs: 0.9 10*3/uL (ref 0.7–4.0)
MCH: 25.3 pg — ABNORMAL LOW (ref 26.0–34.0)
MCV: 79.7 fL (ref 78.0–100.0)
Monocytes Absolute: 0.7 10*3/uL (ref 0.1–1.0)
Platelets: 318 10*3/uL (ref 150–400)
RBC: 4.38 MIL/uL (ref 3.87–5.11)
RDW: 15.5 % (ref 11.5–15.5)

## 2012-11-05 LAB — LACTIC ACID, PLASMA: Lactic Acid, Venous: 1 mmol/L (ref 0.5–2.2)

## 2012-11-05 LAB — COMPREHENSIVE METABOLIC PANEL
ALT: 12 U/L (ref 0–35)
CO2: 29 mEq/L (ref 19–32)
Calcium: 10.3 mg/dL (ref 8.4–10.5)
Creatinine, Ser: 0.58 mg/dL (ref 0.50–1.10)
GFR calc Af Amer: >90 mL/min (ref >90)
GFR calc non Af Amer: >90 mL/min (ref >90)
Glucose, Bld: 105 mg/dL — ABNORMAL HIGH (ref 70–99)
Sodium: 135 mEq/L (ref 135–145)
Total Protein: 8 g/dL (ref 6.0–8.3)

## 2012-11-05 LAB — LIPASE, BLOOD: Lipase: 28 U/L (ref 11–59)

## 2012-11-05 LAB — LITHIUM LEVEL
Lithium Lvl: 1.41 mEq/L — ABNORMAL HIGH (ref 0.80–1.40)
Lithium Lvl: 1.12 mEq/L (ref 0.80–1.40)

## 2012-11-05 LAB — OCCULT BLOOD, POC DEVICE: Fecal Occult Bld: POSITIVE — AB

## 2012-11-05 MED ORDER — SODIUM CHLORIDE 0.9 % IV BOLUS (SEPSIS)
500.0000 mL | Freq: Once | INTRAVENOUS | Status: AC
  Administered 2012-11-05: 500 mL via INTRAVENOUS

## 2012-11-05 MED ORDER — ONDANSETRON HCL 4 MG/2ML IJ SOLN
4.0000 mg | Freq: Once | INTRAMUSCULAR | Status: AC
  Administered 2012-11-05: 4 mg via INTRAVENOUS
  Filled 2012-11-05: qty 2

## 2012-11-05 MED ORDER — SODIUM CHLORIDE 0.9 % IV SOLN: Freq: Once | INTRAVENOUS | Status: DC

## 2012-11-05 MED ORDER — FENTANYL CITRATE 0.05 MG/ML IJ SOLN
50.0000 ug | Freq: Once | INTRAMUSCULAR | Status: AC
  Administered 2012-11-05: 50 ug via INTRAVENOUS
  Filled 2012-11-05: qty 2

## 2012-11-05 MED ORDER — SODIUM CHLORIDE 0.9 % IV BOLUS (SEPSIS)
1000.0000 mL | Freq: Once | INTRAVENOUS | Status: AC
  Administered 2012-11-05: 1000 mL via INTRAVENOUS

## 2012-11-05 NOTE — ED Provider Notes (Signed)
History     CSN: [id number]  Arrival date & time 11/05/12  0713   First MD Initiated Contact with Patient 11/05/12 [phone]      Chief Complaint  Patient presents with  . Nausea  . Emesis  . Rectal Pain  . Abdominal Pain     Patient is a 61 y.o. female presenting with abdominal pain. The history is provided by the patient.  Abdominal Pain The primary symptoms of the illness include abdominal pain, fatigue, nausea, vomiting, diarrhea and hematochezia. The primary symptoms of the illness do not include fever, shortness of breath, hematemesis or vaginal bleeding. The onset of the illness was gradual. The problem has been gradually worsening.  Additional symptoms associated with the illness include back pain. Symptoms associated with the illness do not include constipation.  Pt reports abdominal cramping for past week and also reports diarrhea She reports she is noticing blood in her stool at times but admits to h/o hemorrhoids She does report dark stools at times as well No blood in vomit is reported No syncope but reports dizziness No active CP is reported She reports joint pain in her lower extremities but this is not new for her   Past Medical History  Diagnosis Date  . Anxiety   . Hypothyroidism   . Bipolar disorder   . History of indigestion     Past Surgical History  Procedure Date  . Joint replacement     bilat knee  . Carpel tunnel   . Tonsillectomy   . Total knee revision 05/01/2012    Procedure: TOTAL KNEE REVISION;  Surgeon: Raymon Mutton, MD;  Location: [facility] OR;  Service: Orthopedics;  Laterality: Right;    No family history on file.  History  Substance Use Topics  . Smoking status: Former Games developer  . Smokeless tobacco: Not on file  . Alcohol Use: No    OB History    Grav Para Term Preterm Abortions TAB SAB Ect Mult Living                  Review of Systems  Constitutional: Positive for fatigue. Negative for fever.  Respiratory: Negative for  shortness of breath.   Cardiovascular: Negative for chest pain.  Gastrointestinal: Positive for nausea, vomiting, abdominal pain, diarrhea, blood in stool and hematochezia. Negative for constipation and hematemesis.  Genitourinary: Negative for vaginal bleeding.  Musculoskeletal: Positive for back pain.  Skin: Negative for color change.  Neurological: Positive for dizziness. Negative for weakness.  Psychiatric/Behavioral: Negative for agitation.  All other systems reviewed and are negative.    Allergies  Review of patient's allergies indicates no known allergies.  Home Medications   Current Outpatient Rx  Name  Route  Sig  Dispense  Refill  . ACETAMINOPHEN 325 MG PO TABS   Oral   Take 650 mg by mouth every 6 (six) hours as needed. Headache or pain         . CLONAZEPAM 0.5 MG PO TABS   Oral   Take 0.5-1 mg by mouth QID. Pt takes 1 tab 3 times daily. 2 tabs at bedtime         . FERROUS SULFATE 325 (65 FE) MG PO TABS   Oral   Take 325 mg by mouth 2 (two) times daily.         Marland Kitchen HYDROCODONE-ACETAMINOPHEN 5-325 MG PO TABS   Oral   Take 1 tablet by mouth every 6 (six) hours as needed. pain         .  LEVOTHYROXINE SODIUM 125 MCG PO TABS   Oral   Take 125 mcg by mouth daily.         Marland Kitchen LITHIUM CARBONATE 300 MG PO TABS   Oral   Take 600 mg by mouth 2 (two) times daily.          Marland Kitchen NAPROXEN SODIUM 220 MG PO TABS   Oral   Take 220 mg by mouth 2 (two) times daily with a meal.           . OLANZAPINE 10 MG PO TBDP   Oral   Take 20 mg by mouth at bedtime.           Marland Kitchen OLANZAPINE 20 MG PO TBDP   Oral   Take 20 mg by mouth at bedtime.         Marland Kitchen PE-SHARK LIVER OIL-COCOA BUTTR 0.25-3-85.5 % RE SUPP   Rectal   Place 1 suppository rectally as needed. hemorrhoids         . VITAMIN C 500 MG PO TABS   Oral   Take 500 mg by mouth daily.           BP 118/62  Pulse 77  Temp 98.2 F (36.8 C) (Oral)  Resp 28  Ht 4\' 10"  (1.473 m)  Wt 172 lb (78.019 kg)  BMI  35.95 kg/m2  SpO2 100%  Physical Exam CONSTITUTIONAL: Well developed/well nourished HEAD AND FACE: Normocephalic/atraumatic EYES: EOMI/PERRL, conjunctiva pink ENMT: Mucous membranes moist NECK: supple no meningeal signs SPINE:entire spine nontender CV: S1/S2 noted, no murmurs/rubs/gallops noted LUNGS: Lungs are clear to auscultation bilaterally, no apparent distress ABDOMEN: soft, nontender, no rebound or guarding, +BS Rectal - multiple external hemorrhoids and internal hemorrhoids palpated.  Blood mixed stool noted.  No melena.  No rectal mass/abscess.  Chaperone present GU:no cva tenderness NEURO: Pt is awake/alert, moves all extremitiesx4 EXTREMITIES: pulses normal, full ROM SKIN: warm, color normal PSYCH: no abnormalities of mood noted  ED Course  Procedures  Labs Reviewed  OCCULT BLOOD, POC DEVICE - Abnormal; Notable for the following:    Fecal Occult Bld POSITIVE (*)     All other components within normal limits  CBC WITH DIFFERENTIAL  COMPREHENSIVE METABOLIC PANEL  LIPASE, BLOOD  LACTIC ACID, PLASMA  LITHIUM LEVEL   8:27 AM Pt well appearing, reports vomiting/diarrhea for past week.  Her abdominal exam is benign Suspect blood in stool is from hemorrhoids Will check labs and reassess 9:14 AM D/w [facility] due to elevated lithium Recommend hydration and repeat lithium level in 2-4 hours Pt reported dizziness but no other symptoms.  Likely due to recent GI illness 10:15 AM Pt stable.  Continue IV fluids.  Denies intentional OD on lithium.  She reports taking normal dose this am 12:03 PM Plan is to recheck lithium level and CBC If improved, if she is ambulatory and feels improved can be discharged, hold lithium and f/u as outpatient D/w CDU PA Laveda Norman to f/u on labs  MDM  Nursing notes including past medical history and social history reviewed and considered in documentation Labs/vital reviewed and considered        Date: 11/05/2012  Rate: 76  Rhythm: normal sinus  rhythm  QRS Axis: normal  Intervals: normal  ST/T Wave abnormalities: nonspecific ST changes  Conduction Disutrbances:none  Narrative Interpretation:   Old EKG Reviewed: unchanged    Joya Gaskins, MD 11/05/12 1204

## 2012-11-05 NOTE — ED Notes (Signed)
Patient states she has hemorrhoid pain but she has also had abdominal pain with n/v.  Patient states she has bm daily but her stools are black.  She states her emesis is normal in color.  Patient is also complaining of pain in her left hip/back.  Patient states she is having dizzy spells.  She denies sob/weakness.  Patient   Patient states she is seeing blood with each wipe of her rectum.  Patient denies taking blood thinners at this time.

## 2012-11-05 NOTE — ED Provider Notes (Signed)
Medical screening examination/treatment/procedure(s) were conducted as a shared visit with non-physician practitioner(s) and myself.  I personally evaluated the patient during the encounter   Joya Gaskins, MD 11/05/12 1538

## 2012-11-05 NOTE — ED Notes (Signed)
Patient is resting.  No s/sx of distress.  Report called to CDU, orthostatics not done at this time due to pain medications

## 2012-11-05 NOTE — ED Provider Notes (Signed)
Physical Exam  BP 111/65  Pulse 77  Temp 98.2 F (36.8 C) (Oral)  Resp 16  Ht 4\' 10"  (1.473 m)  Wt 172 lb (78.019 kg)  BMI 35.95 kg/m2  SpO2 99%  Physical Exam On exam patient is alert and oriented x3. Mouth is dry, heart with regular rate and rhythm without murmurs rubs or gallops, lungs clear to auscultation bilaterally, abdomen is soft and nontender. No evidence of pedal edema.  Well-healing surgical scars to anterior knees bilaterally.    ED Course  Procedures  MDM Patient with chief complaint of vomiting and diarrhea for the past week was sent to the CDU for further management. Patient has been evaluated by Dr. Bebe Shaggy.  She does have blood in the stool which is likely from hemorrhoids. Her current hemoglobin is 11.1. Will recheck. Patient has an elevated lithium level of 1.41. Poison control Center has been consult and recommended hydration and repeat lithium level. That has been ordered.  Will recheck hemoglobin, and lithium level.  Will continue IV hydration. If level shows improvement, and patient able to ambulate and can tolerate by mouth and will recommend holding lithium and have patient followup as outpatient.  12:38 PM Pt currently in NAD, sts she feels much better.  Will continue IVF and will provide lunch.  Labs redrawn.    1:41 PM On lab recheck, Lithium level normalized to 1.12.  Her Hgb is 10.3, it was 11.1 four hrs ago.  This could be due to dilution as pt has received several Liters of NS IVF.  Pt able to ambulate without difficulty, no dizziness or lightheadedness.  Reports normal BM, no blood.  Pt stable for discharge.  Recommend f/u with her doctor for recheck of her hemoglobin.  Pt will hold her lithium today and will need to contact her psychiatrist tomorrow for further management.  My attending is aware of plan.    BP 121/77  Pulse 70  Temp 98.2 F (36.8 C) (Oral)  Resp 21  Ht 4\' 10"  (1.473 m)  Wt 172 lb (78.019 kg)  BMI 35.95 kg/m2  SpO2 100%  I  have reviewed nursing notes and vital signs. I personally reviewed the imaging tests through PACS system  I reviewed available ER/hospitalization records thought the EMR  Results for orders placed during the hospital encounter of 11/05/12  CBC WITH DIFFERENTIAL      Component Value Range   WBC 6.6  4.0 - 10.5 K/uL   RBC 4.38  3.87 - 5.11 MIL/uL   Hemoglobin 11.1 (*) 12.0 - 15.0 g/dL   HCT 78.2 (*) 95.6 - 21.3 %   MCV 79.7  78.0 - 100.0 fL   MCH 25.3 (*) 26.0 - 34.0 pg   MCHC 31.8  30.0 - 36.0 g/dL   RDW 08.6  57.8 - 46.9 %   Platelets 318  150 - 400 K/uL   Neutrophils Relative 74  43 - 77 %   Neutro Abs 4.9  1.7 - 7.7 K/uL   Lymphocytes Relative 14  12 - 46 %   Lymphs Abs 0.9  0.7 - 4.0 K/uL   Monocytes Relative 10  3 - 12 %   Monocytes Absolute 0.7  0.1 - 1.0 K/uL   Eosinophils Relative 1  0 - 5 %   Eosinophils Absolute 0.1  0.0 - 0.7 K/uL   Basophils Relative 0  0 - 1 %   Basophils Absolute 0.0  0.0 - 0.1 K/uL  COMPREHENSIVE METABOLIC PANEL  Component Value Range   Sodium 135  135 - 145 mEq/L   Potassium 4.3  3.5 - 5.1 mEq/L   Chloride 99  96 - 112 mEq/L   CO2 29  19 - 32 mEq/L   Glucose, Bld 105 (*) 70 - 99 mg/dL   BUN 10  6 - 23 mg/dL   Creatinine, Ser 0.96  0.50 - 1.10 mg/dL   Calcium 04.5  8.4 - 40.9 mg/dL   Total Protein 8.0  6.0 - 8.3 g/dL   Albumin 3.5  3.5 - 5.2 g/dL   AST 17  0 - 37 U/L   ALT 12  0 - 35 U/L   Alkaline Phosphatase 71  39 - 117 U/L   Total Bilirubin 0.2 (*) 0.3 - 1.2 mg/dL   GFR calc non Af Amer >90  >90 mL/min   GFR calc Af Amer >90  >90 mL/min  LIPASE, BLOOD      Component Value Range   Lipase 28  11 - 59 U/L  LACTIC ACID, PLASMA      Component Value Range   Lactic Acid, Venous 1.0  0.5 - 2.2 mmol/L  LITHIUM LEVEL      Component Value Range   Lithium Lvl 1.41 (*) 0.80 - 1.40 mEq/L  OCCULT BLOOD, POC DEVICE      Component Value Range   Fecal Occult Bld POSITIVE (*) NEGATIVE  CBC      Component Value Range   WBC 5.5  4.0 - 10.5  K/uL   RBC 4.10  3.87 - 5.11 MIL/uL   Hemoglobin 10.3 (*) 12.0 - 15.0 g/dL   HCT 81.1 (*) 91.4 - 78.2 %   MCV 79.5  78.0 - 100.0 fL   MCH 25.1 (*) 26.0 - 34.0 pg   MCHC 31.6  30.0 - 36.0 g/dL   RDW 95.6 (*) 21.3 - 08.6 %   Platelets 293  150 - 400 K/uL  LITHIUM LEVEL      Component Value Range   Lithium Lvl 1.12  0.80 - 1.40 mEq/L   No results found.      Fayrene Helper, PA-C 11/05/12 1404

## 2013-01-04 ENCOUNTER — Emergency Department (HOSPITAL_COMMUNITY)
Admission: EM | Admit: 2013-01-04 | Discharge: 2013-01-04 | Disposition: A | Discharge status: Home / Self Care | Payer: Medicare HMO | Attending: Emergency Medicine | Admitting: Emergency Medicine

## 2013-01-04 ENCOUNTER — Emergency Department (HOSPITAL_COMMUNITY): Payer: Medicare HMO

## 2013-01-04 ENCOUNTER — Emergency Department (HOSPITAL_COMMUNITY)
Admission: EM | Admit: 2013-01-04 | Discharge: 2013-01-04 | Disposition: A | Discharge status: Home / Self Care | Payer: Medicare HMO | Source: Home / Self Care | Attending: Emergency Medicine | Admitting: Emergency Medicine

## 2013-01-04 ENCOUNTER — Encounter (HOSPITAL_COMMUNITY): Payer: Self-pay | Admitting: Emergency Medicine

## 2013-01-04 DIAGNOSIS — R197 Diarrhea, unspecified: Secondary | ICD-10-CM | POA: Insufficient documentation

## 2013-01-04 DIAGNOSIS — Z87891 Personal history of nicotine dependence: Secondary | ICD-10-CM | POA: Insufficient documentation

## 2013-01-04 DIAGNOSIS — Z79899 Other long term (current) drug therapy: Secondary | ICD-10-CM | POA: Insufficient documentation

## 2013-01-04 DIAGNOSIS — R1084 Generalized abdominal pain: Secondary | ICD-10-CM | POA: Insufficient documentation

## 2013-01-04 DIAGNOSIS — I880 Nonspecific mesenteric lymphadenitis: Secondary | ICD-10-CM | POA: Insufficient documentation

## 2013-01-04 DIAGNOSIS — Z8719 Personal history of other diseases of the digestive system: Secondary | ICD-10-CM | POA: Insufficient documentation

## 2013-01-04 DIAGNOSIS — R112 Nausea with vomiting, unspecified: Secondary | ICD-10-CM | POA: Insufficient documentation

## 2013-01-04 DIAGNOSIS — F411 Generalized anxiety disorder: Secondary | ICD-10-CM | POA: Insufficient documentation

## 2013-01-04 DIAGNOSIS — E039 Hypothyroidism, unspecified: Secondary | ICD-10-CM | POA: Insufficient documentation

## 2013-01-04 DIAGNOSIS — F319 Bipolar disorder, unspecified: Secondary | ICD-10-CM | POA: Insufficient documentation

## 2013-01-04 LAB — URINE MICROSCOPIC-ADD ON

## 2013-01-04 LAB — COMPREHENSIVE METABOLIC PANEL WITH GFR
ALT: 10 U/L (ref 0–35)
AST: 15 U/L (ref 0–37)
Albumin: 3.7 g/dL (ref 3.5–5.2)
Alkaline Phosphatase: 57 U/L (ref 39–117)
BUN: 6 mg/dL (ref 6–23)
CO2: 27 meq/L (ref 19–32)
Calcium: 10.3 mg/dL (ref 8.4–10.5)
Chloride: 102 meq/L (ref 96–112)
Creatinine, Ser: 0.72 mg/dL (ref 0.50–1.10)
GFR calc Af Amer: >90 mL/min
GFR calc non Af Amer: >90 mL/min
Glucose, Bld: 96 mg/dL (ref 70–99)
Potassium: 3.5 meq/L (ref 3.5–5.1)
Sodium: 138 meq/L (ref 135–145)
Total Bilirubin: 0.3 mg/dL (ref 0.3–1.2)
Total Protein: 8.2 g/dL (ref 6.0–8.3)

## 2013-01-04 LAB — CBC
HCT: 39.2 % (ref 36.0–46.0)
Hemoglobin: 12.8 g/dL (ref 12.0–15.0)
MCH: 26.2 pg (ref 26.0–34.0)
MCHC: 32.7 g/dL (ref 30.0–36.0)
MCV: 80.3 fL (ref 78.0–100.0)
Platelets: 244 K/uL (ref 150–400)
RBC: 4.88 MIL/uL (ref 3.87–5.11)
RDW: 13.5 % (ref 11.5–15.5)
WBC: 4.9 K/uL (ref 4.0–10.5)

## 2013-01-04 LAB — URINALYSIS, ROUTINE W REFLEX MICROSCOPIC
Bilirubin Urine: NEGATIVE
Glucose, UA: NEGATIVE mg/dL
Hgb urine dipstick: NEGATIVE
Specific Gravity, Urine: 1.012 (ref 1.005–1.030)

## 2013-01-04 LAB — LITHIUM LEVEL: Lithium Lvl: 0.7 meq/L — ABNORMAL LOW (ref 0.80–1.40)

## 2013-01-04 LAB — LIPASE, BLOOD: Lipase: 21 U/L (ref 11–59)

## 2013-01-04 MED ORDER — ONDANSETRON 4 MG PO TBDP
8.0000 mg | ORAL_TABLET | Freq: Once | ORAL | Status: AC
  Administered 2013-01-04: 8 mg via ORAL

## 2013-01-04 MED ORDER — HYDROCODONE-ACETAMINOPHEN 5-325 MG PO TABS: 2.0000 | ORAL_TABLET | ORAL | Status: DC | PRN

## 2013-01-04 MED ORDER — MORPHINE SULFATE 4 MG/ML IJ SOLN
4.0000 mg | Freq: Once | INTRAMUSCULAR | Status: AC
  Administered 2013-01-04: 4 mg via INTRAVENOUS
  Filled 2013-01-04: qty 1

## 2013-01-04 MED ORDER — ONDANSETRON HCL 4 MG/2ML IJ SOLN
4.0000 mg | Freq: Once | INTRAMUSCULAR | Status: AC
  Administered 2013-01-04: 4 mg via INTRAVENOUS
  Filled 2013-01-04: qty 2

## 2013-01-04 MED ORDER — ONDANSETRON 4 MG PO TBDP
ORAL_TABLET | ORAL | Status: AC
  Filled 2013-01-04: qty 2

## 2013-01-04 NOTE — ED Provider Notes (Signed)
History     CSN: [id number]  Arrival date & time 01/04/13  0553   None     Chief Complaint  Patient presents with  . Abdominal Pain    (Consider location/radiation/quality/duration/timing/severity/associated sxs/prior treatment) Patient is a 62 y.o. female presenting with abdominal pain. The history is provided by the patient.  Abdominal Pain Pain location:  Epigastric, L flank and R flank Pain quality: aching and cramping   Pain radiates to:  Does not radiate Pain severity:  Mild Onset quality:  Gradual Duration:  3 days Timing:  Constant Progression:  Waxing and waning Relieved by:  Nothing Worsened by:  Nothing tried Ineffective treatments:  None tried Associated symptoms: diarrhea     Past Medical History  Diagnosis Date  . Anxiety   . Hypothyroidism   . Bipolar disorder   . History of indigestion     Past Surgical History  Procedure Laterality Date  . Joint replacement      bilat knee  . Carpel tunnel    . Tonsillectomy    . Total knee revision  05/01/2012    Procedure: TOTAL KNEE REVISION;  Surgeon: Raymon Mutton, MD;  Location: [facility] OR;  Service: Orthopedics;  Laterality: Right;    No family history on file.  History  Substance Use Topics  . Smoking status: Former Games developer  . Smokeless tobacco: Not on file  . Alcohol Use: No    OB History   Grav Para Term Preterm Abortions TAB SAB Ect Mult Living                  Review of Systems  Gastrointestinal: Positive for abdominal pain and diarrhea.  All other systems reviewed and are negative.    Allergies  Review of patient's allergies indicates no known allergies.  Home Medications   Current Outpatient Rx  Name  Route  Sig  Dispense  Refill  . acetaminophen (TYLENOL) 325 MG tablet   Oral   Take 650 mg by mouth every 6 (six) hours as needed. Headache or pain         . clonazePAM (KLONOPIN) 0.5 MG tablet   Oral   Take 0.5-1 mg by mouth QID. Pt takes 1 tab 3 times daily. 2 tabs at  bedtime         . ferrous sulfate 325 (65 FE) MG tablet   Oral   Take 325 mg by mouth 2 (two) times daily.         Marland Kitchen HYDROcodone-acetaminophen (NORCO/VICODIN) 5-325 MG per tablet   Oral   Take 1 tablet by mouth every 6 (six) hours as needed. pain         . levothyroxine (SYNTHROID, LEVOTHROID) 125 MCG tablet   Oral   Take 125 mcg by mouth daily.         . naproxen sodium (ANAPROX) 220 MG tablet   Oral   Take 220 mg by mouth 2 (two) times daily with a meal.           . OLANZapine zydis (ZYPREXA) 10 MG disintegrating tablet   Oral   Take 20 mg by mouth at bedtime.           Marland Kitchen OLANZapine zydis (ZYPREXA) 20 MG disintegrating tablet   Oral   Take 20 mg by mouth at bedtime.         . shark liver oil-cocoa butter (PREPARATION H) 0.25-3-85.5 % suppository   Rectal   Place 1 suppository rectally as needed. hemorrhoids         .  vitamin C (ASCORBIC ACID) 500 MG tablet   Oral   Take 500 mg by mouth daily.           There were no vitals taken for this visit.  Physical Exam  Vitals reviewed. Constitutional: She is oriented to person, place, and time. She appears well-developed and well-nourished.  HENT:  Head: Normocephalic and atraumatic.  Eyes: Conjunctivae and EOM are normal. Pupils are equal, round, and reactive to light.  Neck: Normal range of motion.  Cardiovascular: Normal rate, regular rhythm and normal heart sounds.   Pulmonary/Chest: Effort normal and breath sounds normal.  Abdominal: Soft. Bowel sounds are normal.    Musculoskeletal: Normal range of motion.  Neurological: She is alert and oriented to person, place, and time.  Skin: Skin is warm and dry.  Psychiatric: She has a normal mood and affect. Her behavior is normal.    ED Course  Procedures (including critical care time)  Labs Reviewed  CBC  COMPREHENSIVE METABOLIC PANEL  URINALYSIS, ROUTINE W REFLEX MICROSCOPIC  LIPASE, BLOOD   No results found.   No diagnosis  found.    MDM  + abd pain.  wil lab,  Ua,  reassess        Rosanne Ashing, MD 01/04/13 [phone]

## 2013-01-04 NOTE — ED Provider Notes (Signed)
History     CSN: [id number]  Arrival date & time 01/04/13  0913   First MD Initiated Contact with Patient 01/04/13 0914      Chief Complaint  Patient presents with  . Emesis  . Abdominal Pain    (Consider location/radiation/quality/duration/timing/severity/associated sxs/prior treatment) HPI Comments: Patient is a 62 year old female who presents with abdominal pain for 3 days. The pain is located in her generalized abdomen and does not radiate. The pain is described as cramping and severe. The pain started gradually and progressively worsened since the onset. No alleviating/aggravating factors. The patient has tried nothing for symptoms without relief. Associated symptoms include nausea and vomiting. Patient denies fever, headache, NVD, chest pain, SOB, dysuria, constipation, abnormal vaginal bleeding/discharge. Patient was discharged from the ED 1 hour ago before checking back in. Patient had a thorough work up including abdominal CT which yielded a diagnosis of mesenteric adenitis.     Patient is a 62 y.o. female presenting with vomiting and abdominal pain.  Emesis Associated symptoms: abdominal pain   Abdominal Pain Associated symptoms: nausea and vomiting     Past Medical History  Diagnosis Date  . Anxiety   . Hypothyroidism   . Bipolar disorder   . History of indigestion     Past Surgical History  Procedure Laterality Date  . Joint replacement      bilat knee  . Carpel tunnel    . Tonsillectomy    . Total knee revision  05/01/2012    Procedure: TOTAL KNEE REVISION;  Surgeon: Raymon Mutton, MD;  Location: [facility] OR;  Service: Orthopedics;  Laterality: Right;    No family history on file.  History  Substance Use Topics  . Smoking status: Former Games developer  . Smokeless tobacco: Not on file  . Alcohol Use: No    OB History   Grav Para Term Preterm Abortions TAB SAB Ect Mult Living                  Review of Systems  Gastrointestinal: Positive for nausea,  vomiting and abdominal pain.  All other systems reviewed and are negative.    Allergies  Review of patient's allergies indicates no known allergies.  Home Medications   Current Outpatient Rx  Name  Route  Sig  Dispense  Refill  . clonazePAM (KLONOPIN) 0.5 MG tablet   Oral   Take 0.5-1 mg by mouth 3 (three) times daily. Pt takes 1 tab 2 times daily. 2 tabs at bedtime         . ferrous sulfate 325 (65 FE) MG tablet   Oral   Take 325 mg by mouth 2 (two) times daily.         Marland Kitchen HYDROcodone-acetaminophen (NORCO/VICODIN) 5-325 MG per tablet   Oral   Take 2 tablets by mouth every 4 (four) hours as needed for pain.   6 tablet   0   . levothyroxine (SYNTHROID, LEVOTHROID) 125 MCG tablet   Oral   Take 125 mcg by mouth daily.         Marland Kitchen lithium 300 MG tablet   Oral   Take 600 mg by mouth 2 (two) times daily.         . shark liver oil-cocoa butter (PREPARATION H) 0.25-3-85.5 % suppository   Rectal   Place 1 suppository rectally as needed. hemorrhoids           BP 123/66  Pulse 47  Temp(Src) 97.9 F (36.6 C) (Oral)  Resp  18  Ht 4\' 11"  (1.499 m)  Wt 170 lb (77.111 kg)  BMI 34.32 kg/m2  SpO2 100%  Physical Exam  Nursing note and vitals reviewed. Constitutional: She is oriented to person, place, and time. She appears well-developed and well-nourished. No distress.  HENT:  Head: Normocephalic and atraumatic.  Eyes: Conjunctivae are normal.  Neck: Normal range of motion.  Cardiovascular: Normal rate and regular rhythm.  Exam reveals no gallop and no friction rub.   No murmur heard. Pulmonary/Chest: Effort normal and breath sounds normal. She has no wheezes. She has no rales. She exhibits no tenderness.  Abdominal: Soft. She exhibits no distension. There is tenderness. There is no rebound and no guarding.  Generalized tenderness to palpation.   Musculoskeletal: Normal range of motion.  Neurological: She is alert and oriented to person, place, and time. Coordination  normal.  Speech is goal-oriented. Moves limbs without ataxia.   Skin: Skin is warm and dry.  Psychiatric: She has a normal mood and affect. Her behavior is normal.    ED Course  Procedures (including critical care time)   Date: 01/04/2013  Rate: 44  Rhythm: sinus bradycardia  QRS Axis: normal  Intervals: normal  ST/T Wave abnormalities: normal  Conduction Disutrbances:none  Narrative Interpretation: NSR unchanged from previous  Old EKG Reviewed: unchanged    Labs Reviewed  LITHIUM LEVEL   Ct Abdomen Pelvis Wo Contrast  01/04/2013  *RADIOLOGY REPORT*  Clinical Data: Bilateral upper quadrant abdominal pain for 1 week, with nausea, vomiting and diarrhea.  CT ABDOMEN AND PELVIS WITHOUT CONTRAST  Technique:  Multidetector CT imaging of the abdomen and pelvis was performed following the standard protocol without intravenous contrast.  Comparison: MRI of the lumbar spine performed 08/17/2008, and abdominal ultrasound performed 02/18/2005.  Findings: Focal airspace opacification at the left lower lobe may reflect scarring, or possibly a mild infectious process. Underlying bronchiectasis is seen.  Scattered small nodular densities are noted at the lung bases, raising concern for a mild infectious or inflammatory process.  A 7 mm node at the epicardial fat is nonspecific in appearance.  The liver and spleen are unremarkable in appearance.  The gallbladder is within normal limits.  The pancreas and adrenal glands are unremarkable.  The kidneys are unremarkable in appearance.  There is no evidence of hydronephrosis.  No renal or ureteral stones are seen.  No perinephric stranding is appreciated.  No free fluid is identified.  The small bowel is unremarkable in appearance.  The stomach is within normal limits.  No acute vascular abnormalities are seen.  Mild scattered calcification is noted along the abdominal aorta and its branches.  There is diffuse haziness within the proximal small bowel mesentery,  with associated prominent mesenteric nodes.  This raises concern for mesenteric adenitis.  The appendix is normal in caliber and contains air, without evidence for appendicitis.  The colon is largely decompressed. Mild diverticulosis is noted along the distal descending colon; the colon is otherwise unremarkable in appearance.  The bladder is mildly distended and grossly unremarkable.  The uterus is within normal limits.  The ovaries are grossly symmetric; no suspicious adnexal masses are seen.  Prominent bilateral inguinal nodes are seen, measuring up to 1.1 cm in short axis.  No acute osseous abnormalities are identified.  Facet disease is noted at the lumbar spine.  There is mild grade 1 anterolisthesis of L3 on L4.  IMPRESSION:  1.  Diffuse haziness within the proximal small bowel mesentery, with associated prominent mesenteric nodes.  This raises concern for mesenteric adenitis. 2.  Focal airspace opacity at the left lower lobe may reflect scarring, or possibly a mild infectious process.  Underlying bronchiectasis seen.  Scattered small nodular densities at the lung bases may reflect an infectious or inflammatory process. 3.  Prominent bilateral inguinal nodes, measuring up to 1.1 cm in short axis.  These are nonspecific. 4.  7 mm node at the epicardial fat is nonspecific in appearance. 5.  Mild diverticulosis along the distal descending colon.   Original Report Authenticated By: Tonia Ghent, M.D.      1. Mesenteric adenitis       MDM  10:03 AM Patient here for the same symptoms. Vitals stable and patient was just discharged. Patient will have EKG and lithium level.   11:27 AM EKG unremarkable. Lithium toxicity excluded based on labs. Patient will be discharged with instructions to return with worsening or concerning symptoms. Patient afebrile and vitals stable for discharge.      Emilia Beck, PA-C 01/04/13 1141

## 2013-01-04 NOTE — ED Notes (Signed)
Per ems-- pt from home. C/o bilateral upper quad. Abdominal pain for 1 week. With nvd. Vs - b/p 120 palp. p- 84 r- 20

## 2013-01-04 NOTE — ED Notes (Signed)
Pt transported to CT ?

## 2013-01-04 NOTE — ED Notes (Signed)
Patient claims that she was here and released 2 hours ago.  Patient advised that she was in the lobby because she didn't have a ride home and started throwing up again.

## 2013-01-06 NOTE — ED Provider Notes (Signed)
Medical screening examination/treatment/procedure(s) were conducted as a shared visit with non-physician practitioner(s) and myself.  I personally evaluated the patient during the encounter   Joya Gaskins, MD 01/06/13 [phone]

## 2013-01-16 ENCOUNTER — Other Ambulatory Visit: Payer: Self-pay | Admitting: Gastroenterology

## 2013-01-16 DIAGNOSIS — R112 Nausea with vomiting, unspecified: Secondary | ICD-10-CM

## 2013-01-16 DIAGNOSIS — R109 Unspecified abdominal pain: Secondary | ICD-10-CM

## 2013-01-30 ENCOUNTER — Ambulatory Visit (HOSPITAL_COMMUNITY): Payer: Medicare HMO

## 2013-01-30 ENCOUNTER — Encounter (HOSPITAL_COMMUNITY): Admission: RE | Admit: 2013-01-30 | Payer: Medicare HMO | Source: Ambulatory Visit

## 2013-02-15 ENCOUNTER — Emergency Department (HOSPITAL_COMMUNITY)
Admission: EM | Admit: 2013-02-15 | Discharge: 2013-02-15 | Disposition: A | Discharge status: Home / Self Care | Payer: Medicare HMO | Attending: Emergency Medicine | Admitting: Emergency Medicine

## 2013-02-15 ENCOUNTER — Encounter (HOSPITAL_COMMUNITY): Payer: Self-pay | Admitting: Emergency Medicine

## 2013-02-15 DIAGNOSIS — Z8719 Personal history of other diseases of the digestive system: Secondary | ICD-10-CM | POA: Insufficient documentation

## 2013-02-15 DIAGNOSIS — F3161 Bipolar disorder, current episode mixed, mild: Secondary | ICD-10-CM | POA: Insufficient documentation

## 2013-02-15 DIAGNOSIS — E039 Hypothyroidism, unspecified: Secondary | ICD-10-CM | POA: Insufficient documentation

## 2013-02-15 DIAGNOSIS — Z87891 Personal history of nicotine dependence: Secondary | ICD-10-CM | POA: Insufficient documentation

## 2013-02-15 DIAGNOSIS — Z79899 Other long term (current) drug therapy: Secondary | ICD-10-CM | POA: Insufficient documentation

## 2013-02-15 DIAGNOSIS — F319 Bipolar disorder, unspecified: Secondary | ICD-10-CM

## 2013-02-15 LAB — URINALYSIS, ROUTINE W REFLEX MICROSCOPIC
Glucose, UA: NEGATIVE mg/dL
Ketones, ur: NEGATIVE mg/dL
Leukocytes, UA: NEGATIVE
pH: 6 (ref 5.0–8.0)

## 2013-02-15 LAB — TSH: TSH: 2.135 u[IU]/mL (ref 0.350–4.500)

## 2013-02-15 LAB — CBC WITH DIFFERENTIAL/PLATELET
Basophils Absolute: 0 10*3/uL (ref 0.0–0.1)
Basophils Relative: 0 % (ref 0–1)
Eosinophils Absolute: 0.1 10*3/uL (ref 0.0–0.7)
MCH: 25.1 pg — ABNORMAL LOW (ref 26.0–34.0)
MCHC: 32.1 g/dL (ref 30.0–36.0)
Neutrophils Relative %: 66 % (ref 43–77)
Platelets: 251 10*3/uL (ref 150–400)
RDW: 14.4 % (ref 11.5–15.5)

## 2013-02-15 LAB — COMPREHENSIVE METABOLIC PANEL
ALT: 15 U/L (ref 0–35)
AST: 20 U/L (ref 0–37)
Albumin: 3.7 g/dL (ref 3.5–5.2)
Alkaline Phosphatase: 59 U/L (ref 39–117)
BUN: 11 mg/dL (ref 6–23)
Potassium: 3.1 mEq/L — ABNORMAL LOW (ref 3.5–5.1)
Sodium: 133 mEq/L — ABNORMAL LOW (ref 135–145)
Total Protein: 8.6 g/dL — ABNORMAL HIGH (ref 6.0–8.3)

## 2013-02-15 LAB — RAPID URINE DRUG SCREEN, HOSP PERFORMED
Cocaine: NOT DETECTED
Opiates: NOT DETECTED
Tetrahydrocannabinol: NOT DETECTED

## 2013-02-15 MED ORDER — LITHIUM CARBONATE ER 300 MG PO TBCR: 900.0000 mg | EXTENDED_RELEASE_TABLET | Freq: Every day | ORAL | Status: DC

## 2013-02-15 NOTE — ED Notes (Signed)
Up tot he bathroom to shower and change scrubs 

## 2013-02-15 NOTE — ED Notes (Signed)
Dr jonnalagadda in w/ pt 

## 2013-02-15 NOTE — [facility] Counselor (Signed)
Per request of psychiatrist-Dr. Elsie Saas contacted patients son Brenda Murray # [phone] (cell). This call was made to discuss his concerns and reasons for bringing his mother into the ED today. Corey's sts that his only concern is related to his mothers lithium level. He explains that his mother seemed slightly disoriented this morning and sts that he wants a full medical work up.  Brenda Murray is willing to pick his mother up from the ED today as long as her lithium levels are appropriate and she is medically stable. He will gladly pick her up from the ED after 4:30pm today.

## 2013-02-15 NOTE — [facility] Suicide Risk Assessment (Signed)
Suicide Risk Assessment  Discharge Assessment     Demographic Factors:  Adolescent or young adult, Divorced or widowed, Low socioeconomic status, Living alone and Unemployed  Mental Status Per Nursing Assessment::   On Admission:     Current Mental Status by Physician: NA  Loss Factors: Decline in physical health and Financial problems/change in socioeconomic status  Historical Factors: Impulsivity  Risk Reduction Factors:   Sense of responsibility to family, Religious beliefs about death, Positive social support, Positive therapeutic relationship and Positive coping skills or problem solving skills  Continued Clinical Symptoms:  Bipolar Disorder:   Mixed State Previous Psychiatric Diagnoses and Treatments Medical Diagnoses and Treatments/Surgeries  Cognitive Features That Contribute To Risk:  Closed-mindedness Polarized thinking    Suicide Risk:  Minimal: No identifiable suicidal ideation.  Patients presenting with no risk factors but with morbid ruminations; may be classified as minimal risk based on the severity of the depressive symptoms  Discharge Diagnoses:   AXIS I:  Bipolar, mixed AXIS II:  Deferred AXIS III:   Past Medical History  Diagnosis Date  . Anxiety   . Hypothyroidism   . Bipolar disorder   . History of indigestion    AXIS IV:  other psychosocial or environmental problems and problems related to social environment AXIS V:  51-60 moderate symptoms  Plan Of Care/Follow-up recommendations:  Activity:  As tolerated Diet:  Regular  Is patient on multiple antipsychotic therapies at discharge:  No   Has Patient had three or more failed trials of antipsychotic monotherapy by history:  No  Recommended Plan for Multiple Antipsychotic Therapies: Not applicable  Abbigal Radich,JANARDHAHA R. 02/15/2013, 5:09 PM

## 2013-02-15 NOTE — ED Notes (Signed)
Report to Brenda Murray pt to room 41. Security is at bedside wanding pt

## 2013-02-15 NOTE — ED Notes (Signed)
Pt presenting to ed with c/o "my nerves are bad" pt states "i'm hearing and seeing things but my eyes are also dry. Pt states my son dropped me off here and he went to work

## 2013-02-15 NOTE — ED Notes (Signed)
Dr jonnalagadda into see 

## 2013-02-15 NOTE — ED Provider Notes (Signed)
History     CSN: [id number]  Arrival date & time 02/15/13  4098   First MD Initiated Contact with Patient 02/15/13 [phone]      No chief complaint on file.   (Consider location/radiation/quality/duration/timing/severity/associated sxs/prior treatment) HPI Comments: Patient says that she is here to get her medications adjusted. She was initially resistant to answering any questions, but eventually did become cooperative. Patient reports that she has been having visual and audio hallucinations. She says that she hears voices, but they are not command type hallucinations, no suicidality or homicidality. Patient says she just feels bad. She reports that her son dropped her off, no family members here in the ER. She does report that she saw her primary Dr. yesterday and had blood work, primary doctor was going to fax the reports to her psychiatrist today. Patient says she thinks her lithium level is off, causing her symptoms.   Past Medical History  Diagnosis Date  . Anxiety   . Hypothyroidism   . Bipolar disorder   . History of indigestion     Past Surgical History  Procedure Laterality Date  . Joint replacement      bilat knee  . Carpel tunnel    . Tonsillectomy    . Total knee revision  05/01/2012    Procedure: TOTAL KNEE REVISION;  Surgeon: Raymon Mutton, MD;  Location: [facility] OR;  Service: Orthopedics;  Laterality: Right;    No family history on file.  History  Substance Use Topics  . Smoking status: Former Games developer  . Smokeless tobacco: Not on file  . Alcohol Use: No    OB History   Grav Para Term Preterm Abortions TAB SAB Ect Mult Living                  Review of Systems  Psychiatric/Behavioral: Positive for hallucinations. Negative for suicidal ideas and self-injury. The patient is nervous/anxious.   All other systems reviewed and are negative.    Allergies  Review of patient's allergies indicates no known allergies.  Home Medications   Current Outpatient Rx   Name  Route  Sig  Dispense  Refill  . clonazePAM (KLONOPIN) 0.5 MG tablet   Oral   Take 0.5-1 mg by mouth 3 (three) times daily. Pt takes 1 tab 2 times daily. 2 tabs at bedtime         . ferrous sulfate 325 (65 FE) MG tablet   Oral   Take 325 mg by mouth 2 (two) times daily.         Marland Kitchen HYDROcodone-acetaminophen (NORCO/VICODIN) 5-325 MG per tablet   Oral   Take 2 tablets by mouth every 4 (four) hours as needed for pain.   6 tablet   0   . levothyroxine (SYNTHROID, LEVOTHROID) 125 MCG tablet   Oral   Take 125 mcg by mouth daily.         Marland Kitchen lithium 300 MG tablet   Oral   Take 600 mg by mouth 2 (two) times daily.         . shark liver oil-cocoa butter (PREPARATION H) 0.25-3-85.5 % suppository   Rectal   Place 1 suppository rectally as needed. hemorrhoids           There were no vitals taken for this visit.  Physical Exam  Constitutional: She is oriented to person, place, and time. She appears well-developed and well-nourished. No distress.  HENT:  Head: Normocephalic and atraumatic.  Right Ear: Hearing normal.  Nose: Nose  normal.  Mouth/Throat: Oropharynx is clear and moist and mucous membranes are normal.  Eyes: Conjunctivae and EOM are normal. Pupils are equal, round, and reactive to light.  Neck: Normal range of motion. Neck supple.  Cardiovascular: Normal rate, regular rhythm, S1 normal and S2 normal.  Exam reveals no gallop and no friction rub.   No murmur heard. Pulmonary/Chest: Effort normal and breath sounds normal. No respiratory distress. She exhibits no tenderness.  Abdominal: Soft. Normal appearance and bowel sounds are normal. There is no hepatosplenomegaly. There is no tenderness. There is no rebound, no guarding, no tenderness at McBurney's point and negative Murphy's sign. No hernia.  Musculoskeletal: Normal range of motion.  Neurological: She is alert and oriented to person, place, and time. She has normal strength. No cranial nerve deficit or  sensory deficit. Coordination normal. GCS eye subscore is 4. GCS verbal subscore is 5. GCS motor subscore is 6.  Skin: Skin is warm, dry and intact. No rash noted. No cyanosis.  Psychiatric: She has a normal mood and affect. Her speech is normal and behavior is normal. Thought content normal.    ED Course  Procedures (including critical care time)  Labs Reviewed  CBC WITH DIFFERENTIAL  COMPREHENSIVE METABOLIC PANEL  URINALYSIS, ROUTINE W REFLEX MICROSCOPIC  LITHIUM LEVEL  ACETAMINOPHEN LEVEL  URINE RAPID DRUG SCREEN (HOSP PERFORMED)  ETHANOL  SALICYLATE LEVEL  TSH   No results found.   Diagnosis: Psychosis    MDM  Patient presents for evaluation of auditory and visual hallucinations. She has a history of anxiety and bipolar disorder. Patient reports that she had had medications changed recently. She has not feel like they are working. She did undergo telemetry-psychiatry evaluation which recommended inpatient treatment. Patient evaluated for placement by ACT team.        Gilda Crease, MD 02/18/13 [phone]

## 2013-02-15 NOTE — ED Notes (Signed)
Pt's son Ginnie Smart [phone]

## 2013-02-15 NOTE — ED Notes (Signed)
Cancel telepsych VOR[facility]-dr jonnalagadda will see

## 2013-02-15 NOTE — ED Notes (Signed)
Up to the bathroom on arrival to unit

## 2013-02-15 NOTE — Consult Note (Signed)
Reason for Consult: Bipolar disorder most recent hospitalization. Change in her medications Referring Physician: Dr. Morton Stall is an 62 y.o. female.  HPI: Patient was seen and chart reviewed. Patient came to the [facility] long emergency department voluntarily seeking, requesting psychiatric evaluation and medication adjustments. Reportedly patient suffered with her stomach complaints, including the diarrhea  which required to go to the [facility] and than she was referred to the primary psychiatry office to lithium medication adjustment as lithium level is slightly high. Patient medication was adjusted to low a level which caused her mood swings, increased goal-directed activity, cleaning to her room and house which is different than before, decreased need for sleep and some confusion. Review of her lithium level indicated , she has low therapeutic range and  willing to be adjusted. patient's son, who was supportive to her again to take her home.   MSE: Patient appeared anxious and nervous mood and appropriate. Affect shows normal rate, rhythm and volume of speech. She is wearing eyeglasses. He has a linear and goal-directed thought process. She has denied suicidal homicidal ideation, intentions and plans. He has no evidence of psychotic symptoms.  Past Medical History  Diagnosis Date  . Anxiety   . Hypothyroidism   . Bipolar disorder   . History of indigestion     Past Surgical History  Procedure Laterality Date  . Joint replacement      bilat knee  . Carpel tunnel    . Tonsillectomy    . Total knee revision  05/01/2012    Procedure: TOTAL KNEE REVISION;  Surgeon: Raymon Mutton, MD;  Location: [facility] OR;  Service: Orthopedics;  Laterality: Right;    No family history on file.  Social History:  reports that she has quit smoking. She does not have any smokeless tobacco history on file. She reports that she does not drink alcohol or use illicit drugs.  Allergies: No Known  Allergies  Medications: I have reviewed the patient's current medications.  Results for orders placed during the hospital encounter of 02/15/13 (from the past 48 hour(s))  CBC WITH DIFFERENTIAL     Status: Abnormal   Collection Time    02/15/13 10:01 AM      Result Value Range   WBC 4.2  4.0 - 10.5 K/uL   RBC 4.30  3.87 - 5.11 MIL/uL   Hemoglobin 10.8 (*) 12.0 - 15.0 g/dL   HCT 65.7 (*) 84.6 - 96.2 %   MCV 78.1  78.0 - 100.0 fL   MCH 25.1 (*) 26.0 - 34.0 pg   MCHC 32.1  30.0 - 36.0 g/dL   RDW 95.2  84.1 - 32.4 %   Platelets 251  150 - 400 K/uL   Neutrophils Relative 66  43 - 77 %   Neutro Abs 2.8  1.7 - 7.7 K/uL   Lymphocytes Relative 22  12 - 46 %   Lymphs Abs 0.9  0.7 - 4.0 K/uL   Monocytes Relative 9  3 - 12 %   Monocytes Absolute 0.4  0.1 - 1.0 K/uL   Eosinophils Relative 3  0 - 5 %   Eosinophils Absolute 0.1  0.0 - 0.7 K/uL   Basophils Relative 0  0 - 1 %   Basophils Absolute 0.0  0.0 - 0.1 K/uL  COMPREHENSIVE METABOLIC PANEL     Status: Abnormal   Collection Time    02/15/13 10:01 AM      Result Value Range   Sodium  133 (*) 135 - 145 mEq/L   Potassium 3.1 (*) 3.5 - 5.1 mEq/L   Chloride 99  96 - 112 mEq/L   CO2 25  19 - 32 mEq/L   Glucose, Bld 79  70 - 99 mg/dL   BUN 11  6 - 23 mg/dL   Creatinine, Ser 4.09  0.50 - 1.10 mg/dL   Calcium 81.1  8.4 - 91.4 mg/dL   Total Protein 8.6 (*) 6.0 - 8.3 g/dL   Albumin 3.7  3.5 - 5.2 g/dL   AST 20  0 - 37 U/L   ALT 15  0 - 35 U/L   Alkaline Phosphatase 59  39 - 117 U/L   Total Bilirubin 0.3  0.3 - 1.2 mg/dL   GFR calc non Af Amer 87 (*) >90 mL/min   GFR calc Af Amer >90  >90 mL/min   Comment:            The eGFR has been calculated     using the CKD EPI equation.     This calculation has not been     validated in all clinical     situations.     eGFR's persistently     <90 mL/min signify     possible Chronic Kidney Disease.  LITHIUM LEVEL     Status: Abnormal   Collection Time    02/15/13 10:01 AM      Result Value  Range   Lithium Lvl 0.52 (*) 0.80 - 1.40 mEq/L  ACETAMINOPHEN LEVEL     Status: None   Collection Time    02/15/13 10:01 AM      Result Value Range   Acetaminophen (Tylenol), Serum <15.0  10 - 30 ug/mL   Comment:            THERAPEUTIC CONCENTRATIONS VARY     SIGNIFICANTLY. A RANGE OF 10-30     ug/mL MAY BE AN EFFECTIVE     CONCENTRATION FOR MANY PATIENTS.     HOWEVER, SOME ARE BEST TREATED     AT CONCENTRATIONS OUTSIDE THIS     RANGE.     ACETAMINOPHEN CONCENTRATIONS     >150 ug/mL AT 4 HOURS AFTER     INGESTION AND >50 ug/mL AT 12     HOURS AFTER INGESTION ARE     OFTEN ASSOCIATED WITH TOXIC     REACTIONS.  ETHANOL     Status: None   Collection Time    02/15/13 10:01 AM      Result Value Range   Alcohol, Ethyl (B) <11  0 - 11 mg/dL   Comment:            LOWEST DETECTABLE LIMIT FOR     SERUM ALCOHOL IS 11 mg/dL     FOR MEDICAL PURPOSES ONLY  SALICYLATE LEVEL     Status: None   Collection Time    02/15/13 10:01 AM      Result Value Range   Salicylate Lvl 2.8  2.8 - 20.0 mg/dL  TSH     Status: None   Collection Time    02/15/13 10:01 AM      Result Value Range   TSH 2.135  0.350 - 4.500 uIU/mL  URINALYSIS, ROUTINE W REFLEX MICROSCOPIC     Status: None   Collection Time    02/15/13 10:15 AM      Result Value Range   Color, Urine YELLOW  YELLOW   APPearance CLEAR  CLEAR   Specific Gravity, Urine  1.014  1.005 - 1.030   pH 6.0  5.0 - 8.0   Glucose, UA NEGATIVE  NEGATIVE mg/dL   Hgb urine dipstick NEGATIVE  NEGATIVE   Bilirubin Urine NEGATIVE  NEGATIVE   Ketones, ur NEGATIVE  NEGATIVE mg/dL   Protein, ur NEGATIVE  NEGATIVE mg/dL   Urobilinogen, UA 0.2  0.0 - 1.0 mg/dL   Nitrite NEGATIVE  NEGATIVE   Leukocytes, UA NEGATIVE  NEGATIVE   Comment: MICROSCOPIC NOT DONE ON URINES WITH NEGATIVE PROTEIN, BLOOD, LEUKOCYTES, NITRITE, OR GLUCOSE <1000 mg/dL.  URINE RAPID DRUG SCREEN (HOSP PERFORMED)     Status: None   Collection Time    02/15/13 10:15 AM      Result Value  Range   Opiates NONE DETECTED  NONE DETECTED   Cocaine NONE DETECTED  NONE DETECTED   Benzodiazepines NONE DETECTED  NONE DETECTED   Amphetamines NONE DETECTED  NONE DETECTED   Tetrahydrocannabinol NONE DETECTED  NONE DETECTED   Barbiturates NONE DETECTED  NONE DETECTED   Comment:            DRUG SCREEN FOR MEDICAL PURPOSES     ONLY.  IF CONFIRMATION IS NEEDED     FOR ANY PURPOSE, NOTIFY LAB     WITHIN 5 DAYS.                LOWEST DETECTABLE LIMITS     FOR URINE DRUG SCREEN     Drug Class       Cutoff (ng/mL)     Amphetamine      1000     Barbiturate      200     Benzodiazepine   200     Tricyclics       300     Opiates          300     Cocaine          300     THC              50    No results found.  Positive for anxiety, bad mood, bipolar and sleep disturbance Blood pressure 102/71, pulse 58, temperature 97.5 F (36.4 C), temperature source Oral, resp. rate 18, SpO2 100.00%.   Assessment/Plan: Bipolar Disorder, most recent episode unspecified.  Recommendation: Patient does not meet criteria for acute psychiatric hospitalization. Patient will be detested home with her family. Recommended lithium CR 450 mg twice daily as her current medication and use low therapeutic range and unstable mood. Patient will be [facility] as he was established patient.  Wael Maestas,JANARDHAHA R. 02/15/2013, 4:56 PM

## 2013-02-15 NOTE — ED Notes (Signed)
Finished w/ shower, back to room, nad

## 2013-02-15 NOTE — ED Notes (Signed)
Written dc instructions and prescription  reviewed w/ pt,  Pt verbalized understanding.  Pt encouraged to follow up w/ her PMD next week and  Return for any concerns.

## 2013-02-15 NOTE — ED Notes (Signed)
Up to the bathroom, reports she is feeling calmer and is tired.

## 2013-02-15 NOTE — ED Notes (Signed)
Dr Elsie Saas in w/ pt

## 2013-02-17 DIAGNOSIS — Z8719 Personal history of other diseases of the digestive system: Secondary | ICD-10-CM | POA: Insufficient documentation

## 2013-02-17 DIAGNOSIS — E039 Hypothyroidism, unspecified: Secondary | ICD-10-CM | POA: Insufficient documentation

## 2013-02-17 DIAGNOSIS — Z79899 Other long term (current) drug therapy: Secondary | ICD-10-CM | POA: Insufficient documentation

## 2013-02-17 DIAGNOSIS — F319 Bipolar disorder, unspecified: Secondary | ICD-10-CM | POA: Insufficient documentation

## 2013-02-17 DIAGNOSIS — R450 Nervousness: Secondary | ICD-10-CM | POA: Insufficient documentation

## 2013-02-17 DIAGNOSIS — F411 Generalized anxiety disorder: Secondary | ICD-10-CM | POA: Insufficient documentation

## 2013-02-17 DIAGNOSIS — Z87891 Personal history of nicotine dependence: Secondary | ICD-10-CM | POA: Insufficient documentation

## 2013-02-18 ENCOUNTER — Emergency Department (HOSPITAL_COMMUNITY)
Admission: EM | Admit: 2013-02-18 | Discharge: 2013-02-18 | Disposition: A | Discharge status: Home / Self Care | Payer: Medicare HMO | Attending: Emergency Medicine | Admitting: Emergency Medicine

## 2013-02-18 ENCOUNTER — Encounter (HOSPITAL_COMMUNITY): Payer: Self-pay | Admitting: *Deleted

## 2013-02-18 DIAGNOSIS — F319 Bipolar disorder, unspecified: Secondary | ICD-10-CM

## 2013-02-18 LAB — COMPREHENSIVE METABOLIC PANEL
AST: 20 U/L (ref 0–37)
BUN: 8 mg/dL (ref 6–23)
CO2: 26 mEq/L (ref 19–32)
Calcium: 10.2 mg/dL (ref 8.4–10.5)
Creatinine, Ser: 0.78 mg/dL (ref 0.50–1.10)
GFR calc non Af Amer: 88 mL/min — ABNORMAL LOW (ref >90)

## 2013-02-18 LAB — CBC WITH DIFFERENTIAL/PLATELET
Basophils Absolute: 0 10*3/uL (ref 0.0–0.1)
Basophils Relative: 0 % (ref 0–1)
Eosinophils Relative: 4 % (ref 0–5)
HCT: 36.3 % (ref 36.0–46.0)
MCHC: 32.5 g/dL (ref 30.0–36.0)
MCV: 78.6 fL (ref 78.0–100.0)
Monocytes Absolute: 0.5 10*3/uL (ref 0.1–1.0)
Monocytes Relative: 8 % (ref 3–12)
RDW: 14.5 % (ref 11.5–15.5)

## 2013-02-18 LAB — RAPID URINE DRUG SCREEN, HOSP PERFORMED
Barbiturates: NOT DETECTED
Benzodiazepines: NOT DETECTED
Cocaine: NOT DETECTED
Opiates: NOT DETECTED

## 2013-02-18 NOTE — ED Notes (Signed)
Pt. Is nervous and scared to stay at home.  Pt. States her eyes are foggy like "smoke".  Pt. Having visual hallucinations.  Pt. Is hearing voices. Pt. Started a new medication today but isn't sure what it is.  Pts symptoms started this week.

## 2013-02-18 NOTE — ED Notes (Signed)
Pt. [name], talking about hearing voices when no one is around.

## 2013-02-18 NOTE — ED Provider Notes (Signed)
History     CSN: [id number]  Arrival date & time 02/17/13  2356   First MD Initiated Contact with Patient 02/18/13 0149      Chief Complaint  Patient presents with  . Hallucinations   HPI  History provided by the patient and recent medical chart. Patient is a 62 year old female with history of bipolar disorder who presents with concerns of "not feeling right". Patient states that she has not got her medications right. She was seen recently for evaluation of psychiatric disorder and hallucinations. She was felt stable to be discharged home and has been taking lithium. Patient states she has been taking her medications as prescribed but states they have not "set in right". She states that she was nervous staying at home alone and called her son to try to stay with him that he did not want her to stay there, she was feeling better and brought her to the emergency room. She denies any SI or HI but does feel unsafe trying to go home. Patient has no other complaints. Denies any recent fever. No chest or abdominal pains. No shortness of breath. No nausea vomiting.    Past Medical History  Diagnosis Date  . Anxiety   . Hypothyroidism   . Bipolar disorder   . History of indigestion     Past Surgical History  Procedure Laterality Date  . Joint replacement      bilat knee  . Carpel tunnel    . Tonsillectomy    . Total knee revision  05/01/2012    Procedure: TOTAL KNEE REVISION;  Surgeon: Raymon Mutton, MD;  Location: [facility] OR;  Service: Orthopedics;  Laterality: Right;    No family history on file.  History  Substance Use Topics  . Smoking status: Former Games developer  . Smokeless tobacco: Not on file  . Alcohol Use: No    OB History   Grav Para Term Preterm Abortions TAB SAB Ect Mult Living                  Review of Systems  Constitutional: Negative for fever.  Eyes: Negative for pain.  Cardiovascular: Negative for chest pain.  Gastrointestinal: Negative for abdominal pain.   Neurological: Negative for light-headedness and headaches.  All other systems reviewed and are negative.    Allergies  Review of patient's allergies indicates no known allergies.  Home Medications   Current Outpatient Rx  Name  Route  Sig  Dispense  Refill  . Aspirin-Acetaminophen-Caffeine (EXCEDRIN PO)   Oral   Take 2 tablets by mouth daily as needed (for pain).          . clonazePAM (KLONOPIN) 0.5 MG tablet   Oral   Take 0.5-1 mg by mouth 3 (three) times daily. Pt takes 1 tablet in the morning, 1 tablet at noon,  and  2 tabs at bedtime.         . ferrous sulfate 325 (65 FE) MG tablet   Oral   Take 325 mg by mouth every morning.          Marland Kitchen levothyroxine (SYNTHROID, LEVOTHROID) 125 MCG tablet   Oral   Take 125 mcg by mouth daily.         Marland Kitchen lithium carbonate (LITHOBID) 300 MG CR tablet   Oral   Take 600 mg by mouth 2 (two) times daily.         . Olopatadine HCl (PATADAY) 0.2 % SOLN   Ophthalmic   Apply 1 drop  to eye daily as needed (allergies).         . phenylephrine-shark liver oil-mineral oil-petrolatum (PREPARATION H) 0.25-3-14-71.9 % rectal ointment   Rectal   Place rectally 2 (two) times daily as needed for hemorrhoids.           BP 122/82  Pulse 100  Temp(Src) 97.7 F (36.5 C) (Oral)  Resp 18  SpO2 98%  Physical Exam  Nursing note and vitals reviewed. Constitutional: She is oriented to person, place, and time. She appears well-developed and well-nourished. No distress.  HENT:  Head: Normocephalic.  Cardiovascular: Normal rate and regular rhythm.   No murmur heard. Pulmonary/Chest: Effort normal and breath sounds normal. No respiratory distress.  Abdominal: Soft.  Musculoskeletal: Normal range of motion.  Surgical scars over bilateral knees consistent with history of surgery  Neurological: She is alert and oriented to person, place, and time.  Skin: Skin is warm and dry. No rash noted.  Psychiatric: She has a normal mood and affect.  She is actively hallucinating.    ED Course  Procedures   Results for orders placed during the hospital encounter of 02/18/13  CBC WITH DIFFERENTIAL      Result Value Range   WBC 6.0  4.0 - 10.5 K/uL   RBC 4.62  3.87 - 5.11 MIL/uL   Hemoglobin 11.8 (*) 12.0 - 15.0 g/dL   HCT 40.9  81.1 - 91.4 %   MCV 78.6  78.0 - 100.0 fL   MCH 25.5 (*) 26.0 - 34.0 pg   MCHC 32.5  30.0 - 36.0 g/dL   RDW 78.2  95.6 - 21.3 %   Platelets 263  150 - 400 K/uL   Neutrophils Relative 68  43 - 77 %   Neutro Abs 4.1  1.7 - 7.7 K/uL   Lymphocytes Relative 20  12 - 46 %   Lymphs Abs 1.2  0.7 - 4.0 K/uL   Monocytes Relative 8  3 - 12 %   Monocytes Absolute 0.5  0.1 - 1.0 K/uL   Eosinophils Relative 4  0 - 5 %   Eosinophils Absolute 0.2  0.0 - 0.7 K/uL   Basophils Relative 0  0 - 1 %   Basophils Absolute 0.0  0.0 - 0.1 K/uL  COMPREHENSIVE METABOLIC PANEL      Result Value Range   Sodium 136  135 - 145 mEq/L   Potassium 3.7  3.5 - 5.1 mEq/L   Chloride 102  96 - 112 mEq/L   CO2 26  19 - 32 mEq/L   Glucose, Bld 101 (*) 70 - 99 mg/dL   BUN 8  6 - 23 mg/dL   Creatinine, Ser 0.86  0.50 - 1.10 mg/dL   Calcium 57.8  8.4 - 46.9 mg/dL   Total Protein 8.4 (*) 6.0 - 8.3 g/dL   Albumin 3.9  3.5 - 5.2 g/dL   AST 20  0 - 37 U/L   ALT 16  0 - 35 U/L   Alkaline Phosphatase 57  39 - 117 U/L   Total Bilirubin 0.3  0.3 - 1.2 mg/dL   GFR calc non Af Amer 88 (*) >90 mL/min   GFR calc Af Amer >90  >90 mL/min  URINE RAPID DRUG SCREEN (HOSP PERFORMED)      Result Value Range   Opiates NONE DETECTED  NONE DETECTED   Cocaine NONE DETECTED  NONE DETECTED   Benzodiazepines NONE DETECTED  NONE DETECTED   Amphetamines NONE DETECTED  NONE DETECTED  Tetrahydrocannabinol NONE DETECTED  NONE DETECTED   Barbiturates NONE DETECTED  NONE DETECTED        1. Bipolar 1 disorder       MDM  1:45AM she seen and evaluated. Patient calm and cooperative. Does not appear in any acute distress.   Spoke with [facility] act team.  They'll see patient and evaluate.  Berna Spare with [facility] act team has seen patient. She continues to deny SI or HI. She actually told him that she now feels medicines might be working but cannot return home until the morning. He is not feel she meets inpatient criteria. At this time will allow patient to stay until morning where she will be discharged.  Patient continued to do well through the night. She has been stable for discharge home and outpatient followup with her psychiatrist.   Angus Seller, PA-C 02/18/13 2030

## 2013-02-18 NOTE — BH Assessment (Signed)
Assessment Note   Brenda Murray is an 62 y.o. female.  Patient was brought to [facility] because of complains about physical symptoms and also seeing & hearing things.  Pt lives in a assisted living facility.  She was seen in the psych ed at [facility] on 03/27 by Dr. Carmelina Dane (see note) and had slight adjustment in medication.  At that time her son wanted to make sure that her valproic acid level was in the therapeutic range.  Patient tonight was complaining of hearing voices and seeing shapes in her assisted living facility apartment.  Patient was initially tearful when talking to clinician.  She said that she hears people talking about her in the apartment.  She also sees shapes moving about, seeing this as if there is a film on her eyes.  Patient calmed while clinician talked to her.  She said that she is to see her psychiatrist (Dr. Lindaann Slough?) on Monday about the medication adjustment.  Patient said that she did not hear or see things while at the hospital and felt like the medicine was "kicking in."  Patient is reluctant to return to her residence tonight but said she could in the morning.  Patient denies any SI, HI or A/V hallucinations at this time.  Clinician talked with Ivonne Andrew, PA and he agreed that patient does not meet criteria for inpatient psychiatric hospitalization at this time.  Patient will return home in the AM and follow up with her psychiatrist on Monday. Axis I: Generalized Anxiety Disorder Axis II: Deferred Axis III:  Past Medical History  Diagnosis Date  . Anxiety   . Hypothyroidism   . Bipolar disorder   . History of indigestion    Axis IV: other psychosocial or environmental problems Axis V: 51-60 moderate symptoms  Past Medical History:  Past Medical History  Diagnosis Date  . Anxiety   . Hypothyroidism   . Bipolar disorder   . History of indigestion     Past Surgical History  Procedure Laterality Date  . Joint replacement      bilat knee  . Carpel tunnel     . Tonsillectomy    . Total knee revision  05/01/2012    Procedure: TOTAL KNEE REVISION;  Surgeon: Raymon Mutton, MD;  Location: [facility] OR;  Service: Orthopedics;  Laterality: Right;    Family History: No family history on file.  Social History:  reports that she has quit smoking. She does not have any smokeless tobacco history on file. She reports that she does not drink alcohol or use illicit drugs.  Additional Social History:  Alcohol / Drug Use Pain Medications: See medication reconcilliation Prescriptions: See medication reconcilliation Over the Counter: See medication reconcilliation History of alcohol / drug use?: No history of alcohol / drug abuse  CIWA: CIWA-Ar BP: 122/82 mmHg Pulse Rate: 100 COWS:    Allergies: No Known Allergies  Home Medications:  (Not in a hospital admission)  OB/GYN Status:  No LMP recorded. Patient is postmenopausal.  General Assessment Data Location of Assessment: [facility] ACT Assessment: Yes Living Arrangements: Alone (Lives in an assisted living facility) Can pt return to current living arrangement?: Yes Admission Status: Voluntary Is patient capable of signing voluntary admission?: Yes Transfer from: Acute Hospital Referral Source: Self/Family/Friend     Risk to self Suicidal Ideation: No Suicidal Intent: No Is patient at risk for suicide?: No Suicidal Plan?: No Access to Means: No What has been your use of drugs/alcohol within the last 12 months?:  N/A Previous Attempts/Gestures: No How many times?: 0 Other Self Harm Risks: None Triggers for Past Attempts: None known Intentional Self Injurious Behavior: None Family Suicide History: Unknown Recent stressful life event(s): Other (Comment) (Medication change) Persecutory voices/beliefs?: Yes Depression: No Depression Symptoms:  (Pt voices no depressive symptoms) Substance abuse history and/or treatment for substance abuse?: No Suicide prevention information given to non-admitted  patients: Not applicable  Risk to Others Homicidal Ideation: No Thoughts of Harm to Others: No Current Homicidal Intent: No Current Homicidal Plan: No Access to Homicidal Means: No Identified Victim: No one History of harm to others?: No Assessment of Violence: None Noted Violent Behavior Description: Pt agitated initially but calmed later Does patient have access to weapons?: No Criminal Charges Pending?: No Does patient have a court date: No  Psychosis Hallucinations: Visual;Auditory (Indestinct shapes moving; hearing voices talking about her) Delusions: None noted  Mental Status Report Appear/Hygiene: Disheveled Eye Contact: Good Motor Activity: Freedom of movement Speech: Logical/coherent Level of Consciousness: Restless;Crying Mood: Anxious;Helpless Affect: Appropriate to circumstance;Anxious Anxiety Level: Moderate Thought Processes: Coherent Judgement: Unimpaired Orientation: Person;Place;Time;Situation Obsessive Compulsive Thoughts/Behaviors: Minimal  Cognitive Functioning Concentration: Decreased Memory: Recent Impaired;Remote Intact IQ: Average Insight: Fair Impulse Control: Fair Appetite: Fair Weight Loss: 0 Weight Gain: 0 Sleep: Increased Total Hours of Sleep: 8 Vegetative Symptoms: None  ADLScreening [facility] Assessment Services) Patient's cognitive ability adequate to safely complete daily activities?: Yes Patient able to express need for assistance with ADLs?: Yes Independently performs ADLs?: Yes (appropriate for developmental age)  Abuse/Neglect [facility]) Physical Abuse: Denies Verbal Abuse: Denies Sexual Abuse: Denies  Prior Inpatient Therapy Prior Inpatient Therapy: No Prior Therapy Dates: N/A Prior Therapy Facilty/Provider(s): N/A Reason for Treatment: N/A  Prior Outpatient Therapy Prior Outpatient Therapy: Yes Prior Therapy Dates: Current Prior Therapy Facilty/Provider(s): Dr. Lindaann Slough Reason for Treatment: depression  ADL Screening  (condition at time of admission) Patient's cognitive ability adequate to safely complete daily activities?: Yes Patient able to express need for assistance with ADLs?: Yes Independently performs ADLs?: Yes (appropriate for developmental age) Weakness of Legs: Both (Complains of knee pain) Weakness of Arms/Hands: None       Abuse/Neglect Assessment (Assessment to be complete while patient is alone) Physical Abuse: Denies Verbal Abuse: Denies Sexual Abuse: Denies Exploitation of patient/patient's resources: Denies Self-Neglect: Denies Values / Beliefs Cultural Requests During Hospitalization: None Spiritual Requests During Hospitalization: None   Advance Directives (For Healthcare) Advance Directive: Patient does not have advance directive;Patient would not like information    Additional Information 1:1 In Past 12 Months?: No CIRT Risk: No Elopement Risk: No Does patient have medical clearance?: Yes     Disposition:  Disposition Initial Assessment Completed for this Encounter: Yes Disposition of Patient: Outpatient treatment;Referred to Type of outpatient treatment:  (Current provider) Patient referred to:  (Pt reports seeing Dr. Azucena Fallen)  On Site Evaluation by:   Reviewed with Physician:  Ivonne Andrew, PA   Beatriz Stallion Ray 02/18/2013 3:08 AM

## 2013-03-07 NOTE — ED Provider Notes (Signed)
Medical screening examination/treatment/procedure(s) were performed by non-physician practitioner and as supervising physician I was immediately available for consultation/collaboration.  Lanie Schelling, MD 03/07/13 0804 

## 2013-03-18 ENCOUNTER — Emergency Department (HOSPITAL_COMMUNITY)
Admission: EM | Admit: 2013-03-18 | Discharge: 2013-03-18 | Disposition: A | Discharge status: Home / Self Care | Payer: Medicare HMO | Attending: Emergency Medicine | Admitting: Emergency Medicine

## 2013-03-18 ENCOUNTER — Encounter (HOSPITAL_COMMUNITY): Payer: Self-pay

## 2013-03-18 DIAGNOSIS — Z87891 Personal history of nicotine dependence: Secondary | ICD-10-CM | POA: Insufficient documentation

## 2013-03-18 DIAGNOSIS — F319 Bipolar disorder, unspecified: Secondary | ICD-10-CM | POA: Insufficient documentation

## 2013-03-18 DIAGNOSIS — F411 Generalized anxiety disorder: Secondary | ICD-10-CM | POA: Insufficient documentation

## 2013-03-18 DIAGNOSIS — E039 Hypothyroidism, unspecified: Secondary | ICD-10-CM | POA: Insufficient documentation

## 2013-03-18 DIAGNOSIS — Z79899 Other long term (current) drug therapy: Secondary | ICD-10-CM | POA: Insufficient documentation

## 2013-03-18 DIAGNOSIS — R109 Unspecified abdominal pain: Secondary | ICD-10-CM | POA: Insufficient documentation

## 2013-03-18 DIAGNOSIS — K649 Unspecified hemorrhoids: Secondary | ICD-10-CM | POA: Insufficient documentation

## 2013-03-18 DIAGNOSIS — R197 Diarrhea, unspecified: Secondary | ICD-10-CM

## 2013-03-18 DIAGNOSIS — Z8719 Personal history of other diseases of the digestive system: Secondary | ICD-10-CM | POA: Insufficient documentation

## 2013-03-18 LAB — CBC WITH DIFFERENTIAL/PLATELET
Basophils Absolute: 0 10*3/uL (ref 0.0–0.1)
Basophils Relative: 0 % (ref 0–1)
Eosinophils Absolute: 0.2 10*3/uL (ref 0.0–0.7)
Eosinophils Relative: 3 % (ref 0–5)
HCT: 35.6 % — ABNORMAL LOW (ref 36.0–46.0)
Hemoglobin: 11.8 g/dL — ABNORMAL LOW (ref 12.0–15.0)
MCH: 25.9 pg — ABNORMAL LOW (ref 26.0–34.0)
MCHC: 33.1 g/dL (ref 30.0–36.0)
MCV: 78.2 fL (ref 78.0–100.0)
Monocytes Absolute: 0.6 10*3/uL (ref 0.1–1.0)
Monocytes Relative: 10 % (ref 3–12)
Neutro Abs: 3.3 10*3/uL (ref 1.7–7.7)
RDW: 15.4 % (ref 11.5–15.5)

## 2013-03-18 LAB — LIPASE, BLOOD: Lipase: 26 U/L (ref 11–59)

## 2013-03-18 LAB — COMPREHENSIVE METABOLIC PANEL
AST: 15 U/L (ref 0–37)
Albumin: 3.7 g/dL (ref 3.5–5.2)
BUN: 6 mg/dL (ref 6–23)
Calcium: 10.3 mg/dL (ref 8.4–10.5)
Creatinine, Ser: 0.76 mg/dL (ref 0.50–1.10)
Total Bilirubin: 0.3 mg/dL (ref 0.3–1.2)
Total Protein: 8.5 g/dL — ABNORMAL HIGH (ref 6.0–8.3)

## 2013-03-18 MED ORDER — HYDROCORTISONE 1 % EX CREA: TOPICAL_CREAM | Freq: Two times a day (BID) | CUTANEOUS | Status: DC

## 2013-03-18 MED ORDER — PROMETHAZINE HCL 25 MG PO TABS: 25.0000 mg | ORAL_TABLET | Freq: Four times a day (QID) | ORAL | Status: DC | PRN

## 2013-03-18 NOTE — ED Provider Notes (Signed)
History     CSN: [id number]  Arrival date & time 03/18/13  1520   First MD Initiated Contact with Patient 03/18/13 1540      Chief Complaint  Patient presents with  . Diarrhea    (Consider location/radiation/quality/duration/timing/severity/associated sxs/prior treatment) HPI Comments: Patient is a 62 year old female presents today with one week of watery diarrhea. She states it is happening every minute of everyday. Is associated with abdominal pain which she cannot describe other than it is like a stomachache "haven't you ever had a stomach ache?". She is on nothing to try to make her stomach ache better. She states food makes it worse and states that the effect is immediate. As soon as she puts food into her mouth she begins to have diarrhea. Last night she ate a hamburger with onions. Earlier today she ate a salad. She has not lost her appetite. She admits to one episode of nonbloody vomit this morning. She also has hemorrhoids. She has noticed bright red blood on the toilet paper. She states it is burning. No fever, chills, shortness of breath, constipation.  Patient is a 62 y.o. female presenting with diarrhea. The history is provided by the patient. No language interpreter was used.  Diarrhea Quality:  Watery Onset quality:  Sudden Associated symptoms: abdominal pain and vomiting   Associated symptoms: no chills, no diaphoresis, no fever and no headaches     Past Medical History  Diagnosis Date  . Anxiety   . Hypothyroidism   . Bipolar disorder   . History of indigestion     Past Surgical History  Procedure Laterality Date  . Joint replacement      bilat knee  . Carpel tunnel    . Tonsillectomy    . Total knee revision  05/01/2012    Procedure: TOTAL KNEE REVISION;  Surgeon: Raymon Mutton, MD;  Location: [facility] OR;  Service: Orthopedics;  Laterality: Right;    No family history on file.  History  Substance Use Topics  . Smoking status: Former Games developer  . Smokeless  tobacco: Not on file  . Alcohol Use: No    OB History   Grav Para Term Preterm Abortions TAB SAB Ect Mult Living                  Review of Systems  Constitutional: Negative for fever, chills and diaphoresis.  Gastrointestinal: Positive for vomiting, abdominal pain and diarrhea.  Neurological: Negative for headaches.  All other systems reviewed and are negative.    Allergies  Review of patient's allergies indicates no known allergies.  Home Medications   Current Outpatient Rx  Name  Route  Sig  Dispense  Refill  . clonazePAM (KLONOPIN) 0.5 MG tablet   Oral   Take 0.5-1 mg by mouth 3 (three) times daily. Pt takes 1 tablet in the morning, 1 tablet at noon,  and  2 tabs at bedtime.         . ferrous sulfate 325 (65 FE) MG tablet   Oral   Take 325 mg by mouth every morning.          Marland Kitchen levothyroxine (SYNTHROID, LEVOTHROID) 125 MCG tablet   Oral   Take 125 mcg by mouth daily.         Marland Kitchen lithium carbonate (LITHOBID) 300 MG CR tablet   Oral   Take 600 mg by mouth 2 (two) times daily.         . Olopatadine HCl (PATADAY) 0.2 % SOLN  Ophthalmic   Apply 1 drop to eye daily as needed (allergies).         . phenylephrine-shark liver oil-mineral oil-petrolatum (PREPARATION H) 0.25-3-14-71.9 % rectal ointment   Rectal   Place 1 application rectally 2 (two) times daily as needed for hemorrhoids. For hemorrhoids         . promethazine (PHENERGAN) 25 MG tablet   Oral   Take 25 mg by mouth every 4 (four) hours as needed for nausea. For nausea           BP 118/69  Pulse 64  Temp(Src) 97.9 F (36.6 C) (Oral)  Resp 14  SpO2 100%  Physical Exam  Nursing note and vitals reviewed. Constitutional: She is oriented to person, place, and time. Vital signs are normal. She appears well-developed and well-nourished. No distress.  HENT:  Head: Normocephalic and atraumatic.  Right Ear: External ear normal.  Left Ear: External ear normal.  Nose: Nose normal.   Mouth/Throat: Oropharynx is clear and moist.  Eyes: Conjunctivae are normal.  Neck: Normal range of motion.  Cardiovascular: Normal rate, regular rhythm and normal heart sounds.   Pulmonary/Chest: Effort normal and breath sounds normal. No stridor. No respiratory distress. She has no wheezes. She has no rales.  Abdominal: Soft. Normal appearance and bowel sounds are normal. She exhibits no distension. There is no hepatosplenomegaly. There is no tenderness. There is no rebound, no guarding and no CVA tenderness.  Musculoskeletal: Normal range of motion.  Neurological: She is alert and oriented to person, place, and time. She has normal strength.  Skin: Skin is warm and dry. She is not diaphoretic. No erythema.  Psychiatric: She has a normal mood and affect. Her behavior is normal.    ED Course  Procedures (including critical care time)  Labs Reviewed  CBC WITH DIFFERENTIAL - Abnormal; Notable for the following:    Hemoglobin 11.8 (*)    HCT 35.6 (*)    MCH 25.9 (*)    All other components within normal limits  COMPREHENSIVE METABOLIC PANEL - Abnormal; Notable for the following:    Potassium 3.4 (*)    Total Protein 8.5 (*)    GFR calc non Af Amer 89 (*)    All other components within normal limits  LIPASE, BLOOD   No results found.   1. Diarrhea       MDM  Patient is a 62 year old female with history of bipolar disorder and anxiety who presents today with 1 week of diarrhea. [name], salads, and fried foods all week. She vomited this am. Labs pending. No recent travel, no abnormal foods for her, no recent antibiotic use.  Labs WNL. No signs of dehydration despite apparent copious diarrhea. No concern for c-diff or infectious diarrhea. Abdomen soft and nontender. No concern for cholecystitis at this time. I suspect this has to do with her poor diet. Discussed the BRAT diet for diarrhea. She was given hyrdocortisone cream for hemorrhoids. Given strict  return instructions. Vital signs stable for discharge. Patient / Family / Caregiver informed of clinical course, understand medical decision-making process, and agree with plan.         Mora Bellman, PA-C 03/19/13 [phone]

## 2013-03-18 NOTE — ED Notes (Signed)
Pt present to ED via [facility] c/o diarrhea lasting for a week. Pt states that she is currently having generalized abdominal pain, denies tenderness. Pt states that she also had N/V once this am. States that everything she eats or drinks causes her to have diarrhea. Pt states that she had a salad today and a fried hamburger yesterday.   Pt alert and oriented x4. Skin warm and dry. Answers questions appropriately.

## 2013-03-22 NOTE — ED Provider Notes (Signed)
Medical screening examination/treatment/procedure(s) were performed by non-physician practitioner and as supervising physician I was immediately available for consultation/collaboration.  Raeford Razor, MD 03/22/13 [phone]

## 2013-05-07 ENCOUNTER — Emergency Department (HOSPITAL_COMMUNITY): Payer: Medicare HMO

## 2013-05-07 ENCOUNTER — Encounter (HOSPITAL_COMMUNITY): Payer: Self-pay | Admitting: *Deleted

## 2013-05-07 ENCOUNTER — Emergency Department (HOSPITAL_COMMUNITY)
Admission: EM | Admit: 2013-05-07 | Discharge: 2013-05-07 | Disposition: A | Discharge status: Home / Self Care | Payer: Medicare HMO | Attending: Emergency Medicine | Admitting: Emergency Medicine

## 2013-05-07 DIAGNOSIS — Z8719 Personal history of other diseases of the digestive system: Secondary | ICD-10-CM | POA: Insufficient documentation

## 2013-05-07 DIAGNOSIS — Z87891 Personal history of nicotine dependence: Secondary | ICD-10-CM | POA: Insufficient documentation

## 2013-05-07 DIAGNOSIS — R51 Headache: Secondary | ICD-10-CM | POA: Insufficient documentation

## 2013-05-07 DIAGNOSIS — E039 Hypothyroidism, unspecified: Secondary | ICD-10-CM | POA: Insufficient documentation

## 2013-05-07 DIAGNOSIS — F411 Generalized anxiety disorder: Secondary | ICD-10-CM | POA: Insufficient documentation

## 2013-05-07 DIAGNOSIS — F319 Bipolar disorder, unspecified: Secondary | ICD-10-CM | POA: Insufficient documentation

## 2013-05-07 DIAGNOSIS — R12 Heartburn: Secondary | ICD-10-CM | POA: Insufficient documentation

## 2013-05-07 DIAGNOSIS — Z79899 Other long term (current) drug therapy: Secondary | ICD-10-CM | POA: Insufficient documentation

## 2013-05-07 DIAGNOSIS — R519 Headache, unspecified: Secondary | ICD-10-CM

## 2013-05-07 DIAGNOSIS — R112 Nausea with vomiting, unspecified: Secondary | ICD-10-CM | POA: Insufficient documentation

## 2013-05-07 LAB — BASIC METABOLIC PANEL
BUN: 7 mg/dL (ref 6–23)
CO2: 30 mEq/L (ref 19–32)
Chloride: 100 mEq/L (ref 96–112)
Creatinine, Ser: 0.62 mg/dL (ref 0.50–1.10)
GFR calc Af Amer: >90 mL/min (ref >90)
Potassium: 4.2 mEq/L (ref 3.5–5.1)

## 2013-05-07 LAB — CBC
HCT: 36.3 % (ref 36.0–46.0)
MCV: 81.4 fL (ref 78.0–100.0)
RBC: 4.46 MIL/uL (ref 3.87–5.11)
RDW: 13.8 % (ref 11.5–15.5)
WBC: 7.9 10*3/uL (ref 4.0–10.5)

## 2013-05-07 MED ORDER — ONDANSETRON HCL 4 MG PO TABS: 4.0000 mg | ORAL_TABLET | Freq: Four times a day (QID) | ORAL | Status: DC

## 2013-05-07 MED ORDER — GI COCKTAIL ~~LOC~~
30.0000 mL | Freq: Once | ORAL | Status: AC
  Administered 2013-05-07: 30 mL via ORAL
  Filled 2013-05-07: qty 30

## 2013-05-07 MED ORDER — METOCLOPRAMIDE HCL 5 MG/ML IJ SOLN
10.0000 mg | Freq: Once | INTRAMUSCULAR | Status: AC
  Administered 2013-05-07: 10 mg via INTRAVENOUS
  Filled 2013-05-07: qty 2

## 2013-05-07 MED ORDER — ONDANSETRON 4 MG PO TBDP
8.0000 mg | ORAL_TABLET | Freq: Once | ORAL | Status: AC
  Administered 2013-05-07: 8 mg via ORAL
  Filled 2013-05-07: qty 2

## 2013-05-07 MED ORDER — DEXAMETHASONE SODIUM PHOSPHATE 4 MG/ML IJ SOLN
10.0000 mg | Freq: Once | INTRAMUSCULAR | Status: AC
  Administered 2013-05-07: 10 mg via INTRAVENOUS
  Filled 2013-05-07: qty 2
  Filled 2013-05-07: qty 1

## 2013-05-07 MED ORDER — DIPHENHYDRAMINE HCL 50 MG/ML IJ SOLN
12.5000 mg | Freq: Once | INTRAMUSCULAR | Status: AC
  Administered 2013-05-07: 12.5 mg via INTRAVENOUS
  Filled 2013-05-07: qty 1

## 2013-05-07 MED ORDER — SODIUM CHLORIDE 0.9 % IV SOLN
INTRAVENOUS | Status: DC
  Administered 2013-05-07: 02:00:00 via INTRAVENOUS

## 2013-05-07 MED ORDER — ONDANSETRON HCL 4 MG/2ML IJ SOLN
4.0000 mg | Freq: Once | INTRAMUSCULAR | Status: AC
  Administered 2013-05-07: 4 mg via INTRAVENOUS
  Filled 2013-05-07: qty 2

## 2013-05-07 MED ORDER — PANTOPRAZOLE SODIUM 40 MG IV SOLR
40.0000 mg | Freq: Once | INTRAVENOUS | Status: AC
  Administered 2013-05-07: 40 mg via INTRAVENOUS
  Filled 2013-05-07: qty 40

## 2013-05-07 NOTE — ED Notes (Signed)
Patient transported to CT 

## 2013-05-07 NOTE — ED Provider Notes (Signed)
History     CSN: [id number]  Arrival date & time 05/07/13  0035   First MD Initiated Contact with Patient 05/07/13 0139      Chief Complaint  Patient presents with  . Headache    (Consider location/radiation/quality/duration/timing/severity/associated sxs/prior treatment) HPI Hx per PT - HA onset tonight with associated N/V and some indigestion described as heartburn.  Gradual onset HA feels like throbbing, has h/o same, not worst HA of life, no F/C, no neck stiffness. Symptoms MOD in severity. No weakness, numbness, visual or gait disturbance, syncope, CP or SOB.   PT lives in ALF, has h/o Bipolar DO.  Past Medical History  Diagnosis Date  . Anxiety   . Hypothyroidism   . Bipolar disorder   . History of indigestion     Past Surgical History  Procedure Laterality Date  . Joint replacement      bilat knee  . Carpel tunnel    . Tonsillectomy    . Total knee revision  05/01/2012    Procedure: TOTAL KNEE REVISION;  Surgeon: Raymon Mutton, MD;  Location: [facility] OR;  Service: Orthopedics;  Laterality: Right;    No family history on file.  History  Substance Use Topics  . Smoking status: Former Games developer  . Smokeless tobacco: Not on file  . Alcohol Use: No    OB History   Grav Para Term Preterm Abortions TAB SAB Ect Mult Living                  Review of Systems  Constitutional: Negative for fever and chills.  HENT: Negative for neck pain and neck stiffness.   Eyes: Negative for pain.  Respiratory: Negative for shortness of breath.   Cardiovascular: Negative for chest pain.  Gastrointestinal: Negative for diarrhea and blood in stool.  Genitourinary: Negative for dysuria.  Musculoskeletal: Negative for back pain.  Skin: Negative for rash.  Neurological: Positive for headaches.  All other systems reviewed and are negative.    Allergies  Review of patient's allergies indicates no known allergies.  Home Medications   Current Outpatient Rx  Name  Route  Sig   Dispense  Refill  . clonazePAM (KLONOPIN) 0.5 MG tablet   Oral   Take 0.5-1 mg by mouth 3 (three) times daily. Pt takes 1 tablet in the morning, 1 tablet at noon,  and  2 tabs at bedtime.         Marland Kitchen levothyroxine (SYNTHROID, LEVOTHROID) 125 MCG tablet   Oral   Take 125 mcg by mouth daily.         Marland Kitchen lithium carbonate (LITHOBID) 300 MG CR tablet   Oral   Take 600 mg by mouth 2 (two) times daily.           BP 121/85  Pulse 61  Temp(Src) 97.9 F (36.6 C) (Oral)  Resp 14  SpO2 100%  Physical Exam  Constitutional: She is oriented to person, place, and time. She appears well-developed and well-nourished.  HENT:  Head: Normocephalic and atraumatic.  Eyes: EOM are normal. Pupils are equal, round, and reactive to light.  Neck: Normal range of motion. Neck supple.  No nuchal rigidity  Cardiovascular: Normal rate, regular rhythm and intact distal pulses.   Pulmonary/Chest: Effort normal and breath sounds normal. No respiratory distress.  Abdominal: Soft. Bowel sounds are normal. She exhibits no distension. There is no tenderness. There is no rebound and no guarding.  Musculoskeletal: Normal range of motion. She exhibits no edema.  Neurological:  She is alert and oriented to person, place, and time. She has normal reflexes. No cranial nerve deficit. She exhibits normal muscle tone. Coordination normal.  Skin: Skin is warm and dry.    ED Course  Procedures (including critical care time)  Labs Reviewed  BASIC METABOLIC PANEL - Abnormal; Notable for the following:    Sodium 133 (*)    Glucose, Bld 110 (*)    Calcium 10.8 (*)    All other components within normal limits  CBC   Ct Head Wo Contrast  05/07/2013   *RADIOLOGY REPORT*  Clinical Data: Headache and vomiting.  CT HEAD WITHOUT CONTRAST  Technique:  Contiguous axial images were obtained from the base of the skull through the vertex without contrast.  Comparison: Head CT 06/28/2009.  Findings: There are some very subtle areas  of decreased attenuation throughout the deep and periventricular white matter of the cerebral hemispheres bilaterally, similar to the prior study, favored to represent mild chronic microvascular ischemic disease. No acute intracranial abnormalities.  Specifically, no evidence of acute intracranial hemorrhage, no definite findings of acute/subacute cerebral ischemia, no mass, mass effect, hydrocephalus or abnormal intra or extra-axial fluid collections. Visualized paranasal sinuses and mastoids are well pneumatized.  No acute displaced skull fractures are identified.  IMPRESSION: 1.  No acute intracranial abnormalities. 2.  Mild chronic microvascular ischemic changes in the cerebral white matter, similar to the prior study.   Original Report Authenticated By: Trudie Reed, M.D.    IVFs, IV HA cocktail" benadryl, reglan, decadron GI cocktail  5:45 AM recheck - HA much better and PT requesting to be discharged home, no change normal neuro exam. Plan d/c home, outpatient follow up, HA precautions provided.    MDM  HA and vomiting, evaluated with CT scan, and labs as above  Treated with IVFs and medications - condition improved  VS and nurses notes reviewed and considered, old records reviewed       Sunnie Nielsen, MD 05/07/13 [phone]

## 2013-05-07 NOTE — ED Notes (Signed)
[facility] headache and vomiting today.  W/c from the ambulance

## 2013-06-05 IMAGING — CR DG CHEST 2V
2 series · 2 of 2 positions shown · non-contrast
Comparison: 12/03/2010

CLINICAL DATA: Chest pain.

CHEST - 2 VIEW

[w chest pa]
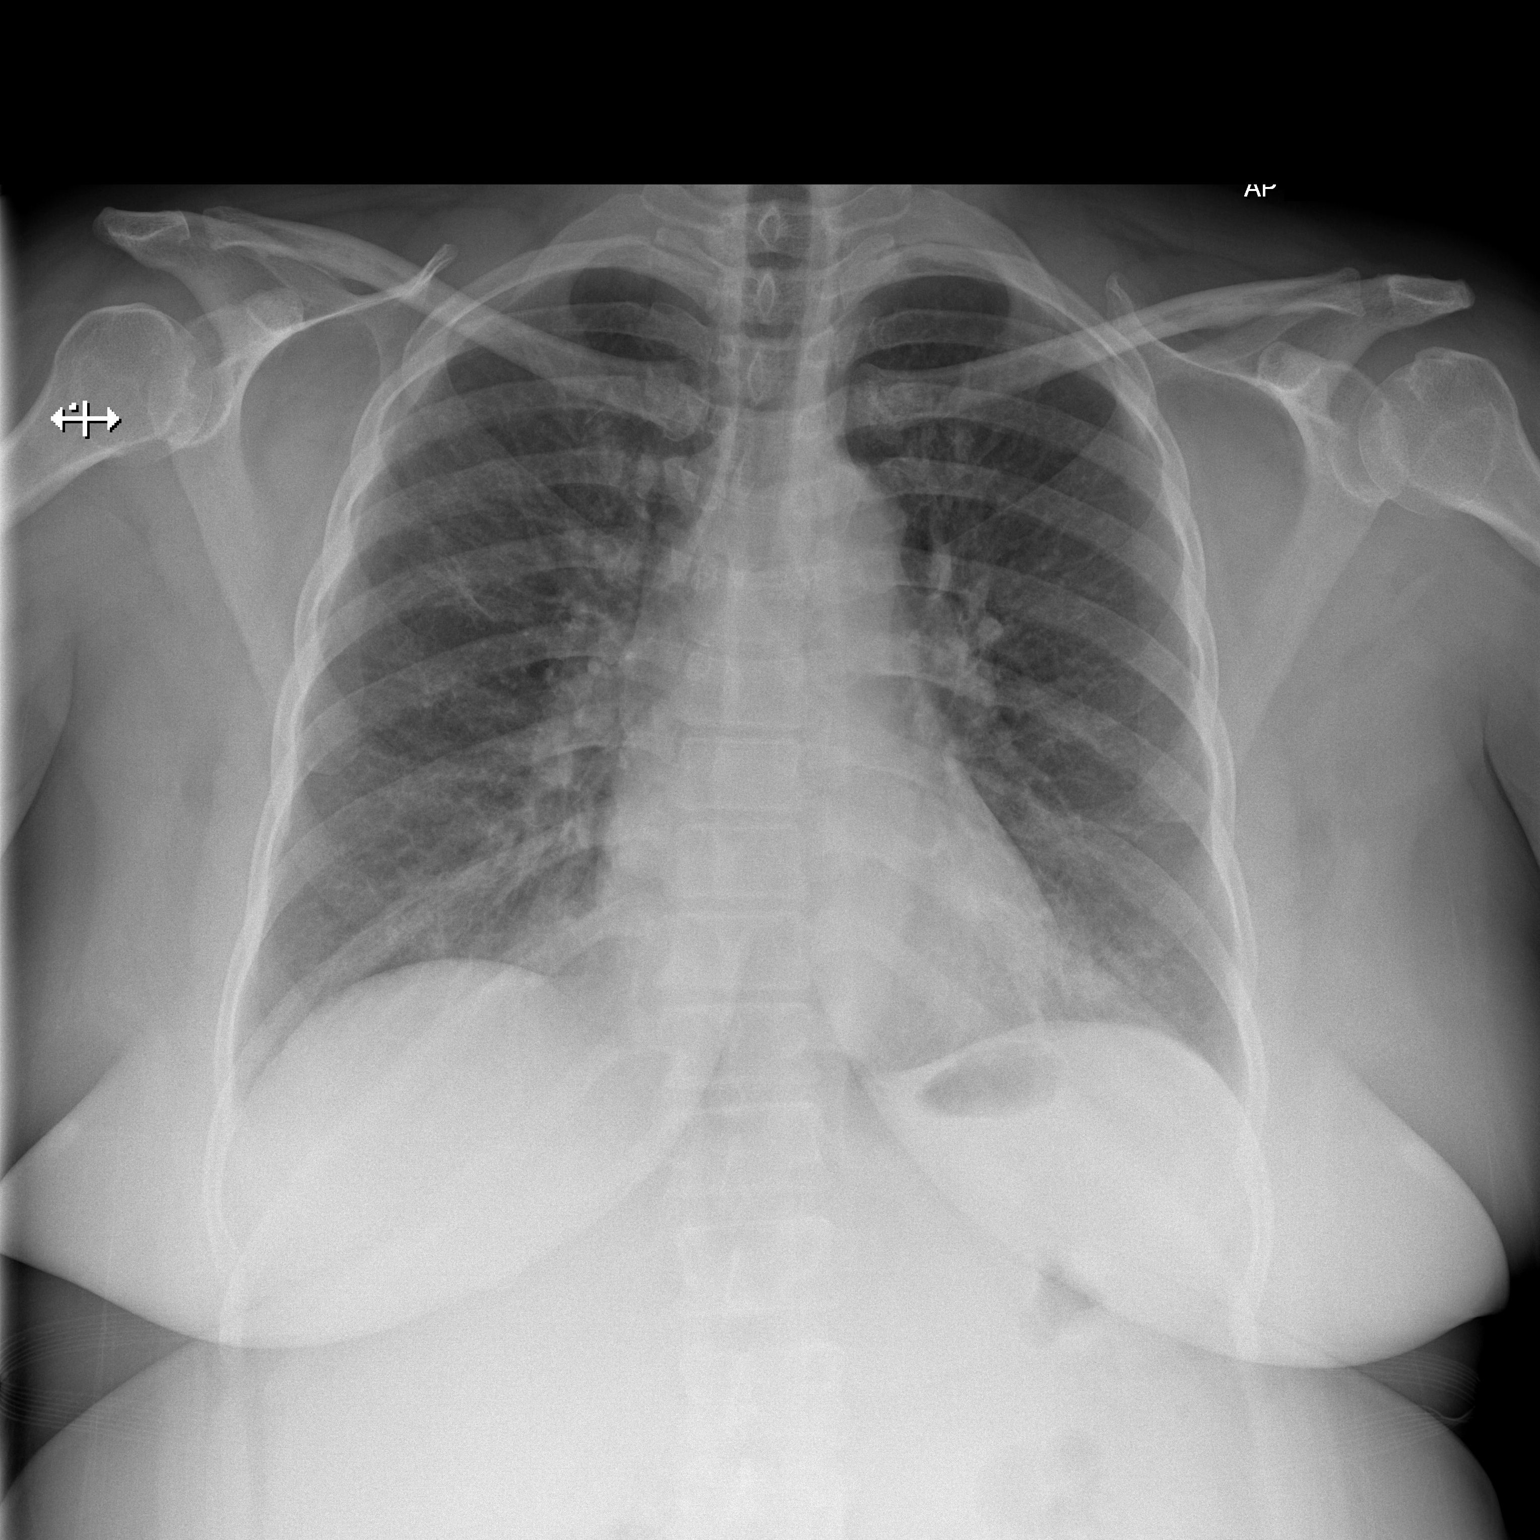

[w chest lat]
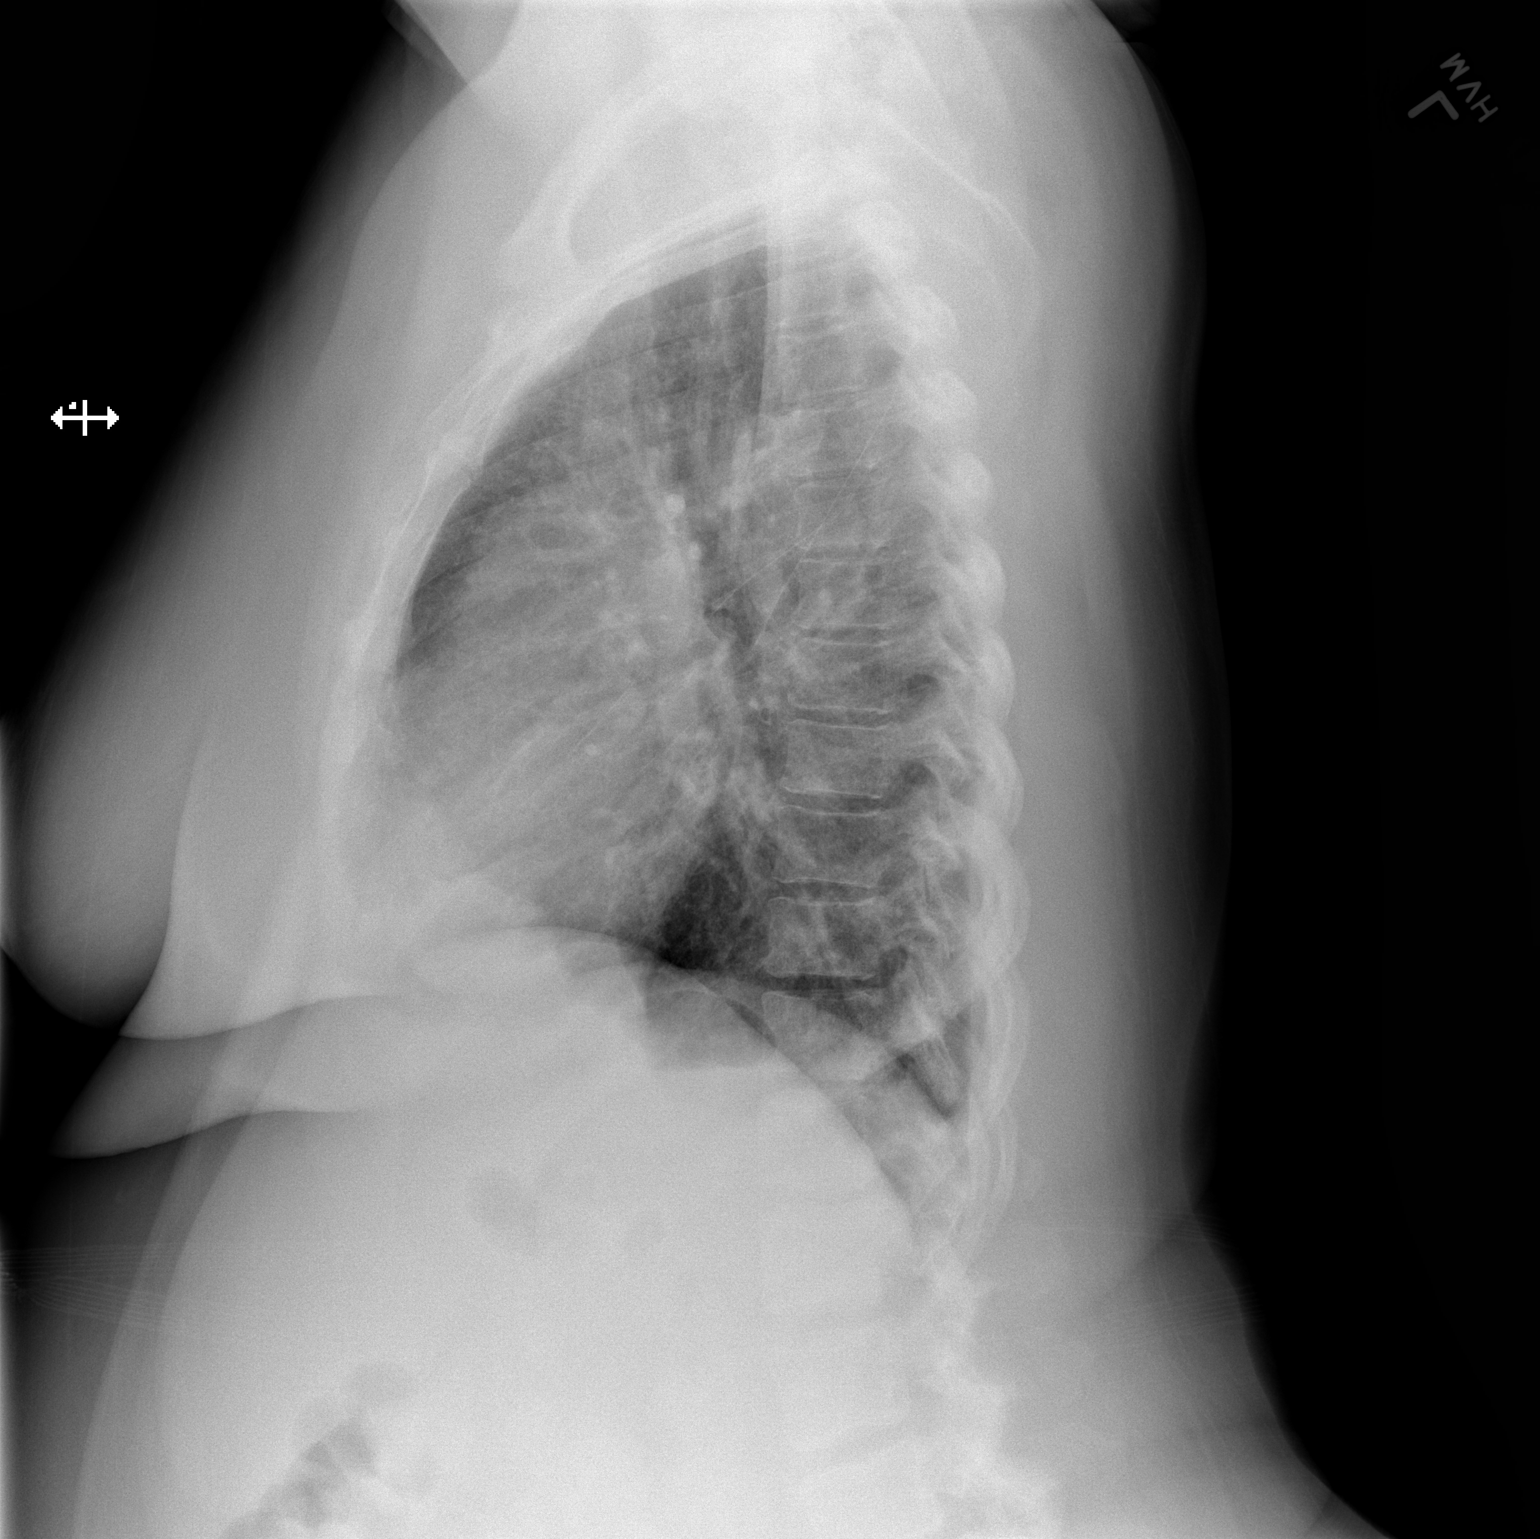

[2 of 2 positions shown; findings below may reference images not displayed]

FINDINGS: Mild peribronchial thickening.  No confluent airspace
opacities or effusions.  Heart is normal size.
IMPRESSION: Mild bronchitic changes.

## 2013-06-08 ENCOUNTER — Emergency Department (HOSPITAL_COMMUNITY)
Admission: EM | Admit: 2013-06-08 | Discharge: 2013-06-08 | Disposition: A | Discharge status: Home / Self Care | Payer: Medicare HMO | Attending: Emergency Medicine | Admitting: Emergency Medicine

## 2013-06-08 ENCOUNTER — Encounter (HOSPITAL_COMMUNITY): Payer: Self-pay | Admitting: *Deleted

## 2013-06-08 DIAGNOSIS — E039 Hypothyroidism, unspecified: Secondary | ICD-10-CM | POA: Insufficient documentation

## 2013-06-08 DIAGNOSIS — F411 Generalized anxiety disorder: Secondary | ICD-10-CM | POA: Insufficient documentation

## 2013-06-08 DIAGNOSIS — R443 Hallucinations, unspecified: Secondary | ICD-10-CM | POA: Insufficient documentation

## 2013-06-08 DIAGNOSIS — F319 Bipolar disorder, unspecified: Secondary | ICD-10-CM | POA: Insufficient documentation

## 2013-06-08 DIAGNOSIS — Z87891 Personal history of nicotine dependence: Secondary | ICD-10-CM | POA: Insufficient documentation

## 2013-06-08 DIAGNOSIS — Z79899 Other long term (current) drug therapy: Secondary | ICD-10-CM | POA: Insufficient documentation

## 2013-06-08 DIAGNOSIS — Z8719 Personal history of other diseases of the digestive system: Secondary | ICD-10-CM | POA: Insufficient documentation

## 2013-06-08 DIAGNOSIS — L299 Pruritus, unspecified: Secondary | ICD-10-CM | POA: Insufficient documentation

## 2013-06-08 LAB — COMPREHENSIVE METABOLIC PANEL
ALT: 16 U/L (ref 0–35)
Alkaline Phosphatase: 84 U/L (ref 39–117)
BUN: 5 mg/dL — ABNORMAL LOW (ref 6–23)
CO2: 28 mEq/L (ref 19–32)
Calcium: 10.6 mg/dL — ABNORMAL HIGH (ref 8.4–10.5)
GFR calc Af Amer: >90 mL/min (ref >90)
GFR calc non Af Amer: >90 mL/min (ref >90)
Glucose, Bld: 102 mg/dL — ABNORMAL HIGH (ref 70–99)
Potassium: 3.2 mEq/L — ABNORMAL LOW (ref 3.5–5.1)
Sodium: 139 mEq/L (ref 135–145)
Total Protein: 7.9 g/dL (ref 6.0–8.3)

## 2013-06-08 LAB — CBC
HCT: 36.3 % (ref 36.0–46.0)
Hemoglobin: 12.4 g/dL (ref 12.0–15.0)
MCH: 27.1 pg (ref 26.0–34.0)
MCHC: 34.2 g/dL (ref 30.0–36.0)
RBC: 4.57 MIL/uL (ref 3.87–5.11)

## 2013-06-08 LAB — ETHANOL: Alcohol, Ethyl (B): <11 mg/dL (ref 0–11)

## 2013-06-08 LAB — POCT I-STAT, CHEM 8
BUN: <3 mg/dL — ABNORMAL LOW (ref 6–23)
Chloride: 102 mEq/L (ref 96–112)
HCT: 38 % (ref 36.0–46.0)
Potassium: 3.1 mEq/L — ABNORMAL LOW (ref 3.5–5.1)

## 2013-06-08 LAB — LITHIUM LEVEL: Lithium Lvl: 0.87 mEq/L (ref 0.80–1.40)

## 2013-06-08 LAB — RAPID URINE DRUG SCREEN, HOSP PERFORMED
Barbiturates: NOT DETECTED
Tetrahydrocannabinol: NOT DETECTED

## 2013-06-08 MED ORDER — ONDANSETRON HCL 4 MG PO TABS: 4.0000 mg | ORAL_TABLET | Freq: Three times a day (TID) | ORAL | Status: DC | PRN

## 2013-06-08 MED ORDER — LORAZEPAM 1 MG PO TABS
1.0000 mg | ORAL_TABLET | Freq: Three times a day (TID) | ORAL | Status: DC | PRN
  Filled 2013-06-08: qty 1

## 2013-06-08 MED ORDER — IBUPROFEN 200 MG PO TABS: 600.0000 mg | ORAL_TABLET | Freq: Three times a day (TID) | ORAL | Status: DC | PRN

## 2013-06-08 MED ORDER — ALUM & MAG HYDROXIDE-SIMETH 200-200-20 MG/5ML PO SUSP: 30.0000 mL | ORAL | Status: DC | PRN

## 2013-06-08 MED ORDER — HYDROXYZINE HCL 25 MG PO TABS
25.0000 mg | ORAL_TABLET | Freq: Four times a day (QID) | ORAL | Status: DC | PRN
  Administered 2013-06-08: 25 mg via ORAL
  Filled 2013-06-08: qty 1

## 2013-06-08 MED ORDER — ZOLPIDEM TARTRATE 5 MG PO TABS: 5.0000 mg | ORAL_TABLET | Freq: Every evening | ORAL | Status: DC | PRN

## 2013-06-08 MED ORDER — HYDROXYZINE HCL 25 MG PO TABS: 50.0000 mg | ORAL_TABLET | Freq: Four times a day (QID) | ORAL | Status: DC | PRN

## 2013-06-08 NOTE — ED Notes (Addendum)
Patient presents with tactile hallucinations. NO SI/HI. Hx bipolar States that "bugs" are crawling all over her and causing her to itch and break out in a rash. Also states that there are worms crawling in her rectum. Reports washing and changing her sheets has not helped this. Unable to sleep d/t itching. Per RN assessment, no bugs noted on skin or clothing. Skin intact. No rash or skin breakdown noted.

## 2013-06-08 NOTE — ED Notes (Addendum)
Pt. States she does not want to sign out AMA but will if she gets a ride prior to Dr. Radford Pax seeing her again.

## 2013-06-08 NOTE — ED Notes (Signed)
Patient placed in paper scrubs. Personal items inventoried and secured in ED storage. Security notified to wand patient. Charge RN and [facility] notified of patient.

## 2013-06-08 NOTE — ED Notes (Signed)
Dr. Radford Pax in to see patient. Pt. C/o worms crawling on skin. Patient told that there are no worms on skin and that she is hallucinating. Pt.  Upset and states "I'm not hallucinating. You can blame this on my bipolar but I have worms crawling on me. Can't you see them? I need something to kill these worms!". Unable to reason with patient. Pt. Not cooperative for telepsych. Dr. Radford Pax to discharge patient home with prescription. Pt. Happy with plan of care. Stating "I just want to go home".

## 2013-06-08 NOTE — ED Notes (Signed)
Pt given 2 warm blanket and showed where the bathroom is.

## 2013-06-08 NOTE — ED Notes (Signed)
Pt. Discharged to waiting room where patient will wait for son to pick up. Pt. Denies SI/HI. Calm and cooperative. Denies needs prior to leaving.

## 2013-06-08 NOTE — ED Notes (Signed)
Security at bedside to wand the patient.  

## 2013-06-08 NOTE — ED Notes (Signed)
Pt. Reports that the medicine did not help. Tearful stating "I just want to go home. You all are not helping me". Told patient about plan of care. Pt. Upset stating "I'm telling you I'm not crazy. I have bugs crawling on me". This nurse tried to use therapeutic communication to calm patient. No "bugs" seen on patient. Pt. Requesting to speak with physician. Dr. Radford Pax informed.

## 2013-06-08 NOTE — ED Notes (Signed)
Pt. Out to nurses station tearful stating "when is someone going to help me? I'm not crazy, I just need someone to kill these bugs on me". Escorted patient back to room and discussed plan of care. Pt. Asking for medicine to stop the itching. Dr. Radford Pax informed.

## 2013-06-08 NOTE — ED Provider Notes (Signed)
History    CSN: [id number] Arrival date & time 06/08/13  1235  First MD Initiated Contact with Patient 06/08/13 1626     Chief Complaint  Patient presents with  . Hallucinations    HPI Pt reports having white worms crawling all over her and causing an itching rash. Also reports that they have been in her hair, nose and hemorrhoids?   Past Medical History  Diagnosis Date  . Anxiety   . Hypothyroidism   . Bipolar disorder   . History of indigestion    Past Surgical History  Procedure Laterality Date  . Joint replacement      bilat knee  . Carpel tunnel    . Tonsillectomy    . Total knee revision  05/01/2012    Procedure: TOTAL KNEE REVISION;  Surgeon: Raymon Mutton, MD;  Location: [facility] OR;  Service: Orthopedics;  Laterality: Right;   History reviewed. No pertinent family history. History  Substance Use Topics  . Smoking status: Former Games developer  . Smokeless tobacco: Not on file  . Alcohol Use: No   OB History   Grav Para Term Preterm Abortions TAB SAB Ect Mult Living                 Review of Systems  Unable to perform ROS: Psychiatric disorder    Allergies  Review of patient's allergies indicates no known allergies.  Home Medications   Current Outpatient Rx  Name  Route  Sig  Dispense  Refill  . clonazePAM (KLONOPIN) 0.5 MG tablet   Oral   Take 0.5-1 mg by mouth 3 (three) times daily. Pt takes 1 tablet in the morning, 1 tablet at noon,  and  2 tabs at bedtime.         Marland Kitchen levothyroxine (SYNTHROID, LEVOTHROID) 125 MCG tablet   Oral   Take 125 mcg by mouth daily.         Marland Kitchen lithium carbonate (LITHOBID) 300 MG CR tablet   Oral   Take 600 mg by mouth 2 (two) times daily.         . Multiple Vitamins-Minerals (MULTIVITAMIN WITH MINERALS) tablet   Oral   Take 1 tablet by mouth daily.         . ondansetron (ZOFRAN) 4 MG tablet   Oral   Take 1 tablet (4 mg total) by mouth every 6 (six) hours.   12 tablet   0   . hydrOXYzine (ATARAX/VISTARIL) 25 MG  tablet   Oral   Take 2 tablets (50 mg total) by mouth every 6 (six) hours as needed for itching.   30 tablet   0    BP 135/79  Pulse 69  Temp(Src) 97.6 F (36.4 C) (Oral)  Resp 15  SpO2 100% Physical Exam  Nursing note and vitals reviewed. Constitutional: She is oriented to person, place, and time. She appears well-developed and well-nourished. No distress.  HENT:  Head: Normocephalic and atraumatic.  Eyes: Pupils are equal, round, and reactive to light.  Neck: Normal range of motion.  Cardiovascular: Normal rate and intact distal pulses.   Pulmonary/Chest: No respiratory distress.  Abdominal: Normal appearance. She exhibits no distension.  Musculoskeletal: Normal range of motion.  Neurological: She is alert and oriented to person, place, and time. No cranial nerve deficit.  Skin: Skin is warm and dry. No rash noted.  Psychiatric: She has a normal mood and affect. Her behavior is normal. She expresses no homicidal and no suicidal ideation.  ED Course  Procedures (including critical care time) Labs Reviewed  COMPREHENSIVE METABOLIC PANEL - Abnormal; Notable for the following:    Potassium 3.2 (*)    Glucose, Bld 102 (*)    BUN 5 (*)    Calcium 10.6 (*)    All other components within normal limits  SALICYLATE LEVEL - Abnormal; Notable for the following:    Salicylate Lvl <2.0 (*)    All other components within normal limits  POCT I-STAT, CHEM 8 - Abnormal; Notable for the following:    Potassium 3.1 (*)    BUN <3 (*)    Calcium, Ion 1.36 (*)    All other components within normal limits  LITHIUM LEVEL  ACETAMINOPHEN LEVEL  CBC  ETHANOL  URINE RAPID DRUG SCREEN (HOSP PERFORMED)   No results found. 1. Itching   2. Hallucinations   3. Bipolar disorder     MDM  No homicidal or suicidal thoughts or ideations.  We'll discharge on hydroxyzine with followup.  Nelia Shi, MD 06/08/13 [id number]

## 2013-06-08 NOTE — ED Notes (Signed)
Pt reports having white worms crawling all over her and causing an itching rash. Also reports that they have been in her hair, nose and hemorrhoids?

## 2013-06-08 NOTE — ED Notes (Signed)
Talked with Dr. Radford Pax about patient requesting to go home. Per Dr. Radford Pax, patient may wait to be seen or patient may sign out AMA now. Patient informed of choices. States "You haven't done anything for me". Pt. Up to nurses station to call son for ride.

## 2013-06-10 ENCOUNTER — Emergency Department (HOSPITAL_COMMUNITY)
Admission: EM | Admit: 2013-06-10 | Discharge: 2013-06-10 | Disposition: A | Discharge status: Home / Self Care | Payer: Medicare HMO | Attending: Emergency Medicine | Admitting: Emergency Medicine

## 2013-06-10 DIAGNOSIS — R443 Hallucinations, unspecified: Secondary | ICD-10-CM | POA: Insufficient documentation

## 2013-06-10 DIAGNOSIS — Z79899 Other long term (current) drug therapy: Secondary | ICD-10-CM | POA: Insufficient documentation

## 2013-06-10 DIAGNOSIS — F319 Bipolar disorder, unspecified: Secondary | ICD-10-CM | POA: Insufficient documentation

## 2013-06-10 DIAGNOSIS — R300 Dysuria: Secondary | ICD-10-CM | POA: Insufficient documentation

## 2013-06-10 DIAGNOSIS — F458 Other somatoform disorders: Secondary | ICD-10-CM | POA: Insufficient documentation

## 2013-06-10 DIAGNOSIS — E039 Hypothyroidism, unspecified: Secondary | ICD-10-CM | POA: Insufficient documentation

## 2013-06-10 DIAGNOSIS — Z8719 Personal history of other diseases of the digestive system: Secondary | ICD-10-CM | POA: Insufficient documentation

## 2013-06-10 DIAGNOSIS — F22 Delusional disorders: Secondary | ICD-10-CM | POA: Insufficient documentation

## 2013-06-10 DIAGNOSIS — F411 Generalized anxiety disorder: Secondary | ICD-10-CM | POA: Insufficient documentation

## 2013-06-10 DIAGNOSIS — Z87891 Personal history of nicotine dependence: Secondary | ICD-10-CM | POA: Insufficient documentation

## 2013-06-10 LAB — CBC WITH DIFFERENTIAL/PLATELET
Basophils Absolute: 0 10*3/uL (ref 0.0–0.1)
HCT: 36.4 % (ref 36.0–46.0)
Hemoglobin: 11.8 g/dL — ABNORMAL LOW (ref 12.0–15.0)
Lymphocytes Relative: 20 % (ref 12–46)
Lymphs Abs: 1.2 10*3/uL (ref 0.7–4.0)
MCV: 79.8 fL (ref 78.0–100.0)
Monocytes Absolute: 0.7 10*3/uL (ref 0.1–1.0)
Monocytes Relative: 12 % (ref 3–12)
Neutro Abs: 4.1 10*3/uL (ref 1.7–7.7)
RBC: 4.56 MIL/uL (ref 3.87–5.11)
WBC: 6.2 10*3/uL (ref 4.0–10.5)

## 2013-06-10 LAB — COMPREHENSIVE METABOLIC PANEL
AST: 21 U/L (ref 0–37)
CO2: 27 mEq/L (ref 19–32)
Chloride: 104 mEq/L (ref 96–112)
Creatinine, Ser: 0.74 mg/dL (ref 0.50–1.10)
GFR calc Af Amer: >90 mL/min (ref >90)
GFR calc non Af Amer: 89 mL/min — ABNORMAL LOW (ref >90)
Glucose, Bld: 91 mg/dL (ref 70–99)
Total Bilirubin: 0.3 mg/dL (ref 0.3–1.2)

## 2013-06-10 LAB — URINALYSIS W MICROSCOPIC + REFLEX CULTURE
Bilirubin Urine: NEGATIVE
Hgb urine dipstick: NEGATIVE
Protein, ur: NEGATIVE mg/dL
Urobilinogen, UA: 0.2 mg/dL (ref 0.0–1.0)

## 2013-06-10 MED ORDER — HYDROXYZINE HCL 25 MG PO TABS
50.0000 mg | ORAL_TABLET | Freq: Once | ORAL | Status: AC
  Administered 2013-06-10: 50 mg via ORAL
  Filled 2013-06-10: qty 2

## 2013-06-10 MED ORDER — HYDROXYZINE HCL 25 MG PO TABS: 25.0000 mg | ORAL_TABLET | Freq: Four times a day (QID) | ORAL | Status: DC

## 2013-06-10 MED ORDER — POTASSIUM CHLORIDE CRYS ER 20 MEQ PO TBCR: 40.0000 meq | EXTENDED_RELEASE_TABLET | Freq: Once | ORAL | Status: DC

## 2013-06-10 NOTE — ED Notes (Signed)
Per EMS-patient c/o itching all over. Patient told EMS that her gray hairs were worms. EMS stated that the patient had cleaned her entire house with bleach. Patient was seen previous for itching in the ED.

## 2013-06-10 NOTE — ED Provider Notes (Signed)
History    CSN: [id number] Arrival date & time 06/10/13  1507  First MD Initiated Contact with Patient 06/10/13 1639     Chief Complaint  Patient presents with  . Pruritis   (Consider location/radiation/quality/duration/timing/severity/associated sxs/prior Treatment) HPI Brenda Murray is a 62 y.o. female who presents to ED with complaint of itching all over, having itching and burning in her hair, nose, eyes, and in private area. States itching started several weeks ago. Worsening. States was here two days ago, was given vistaril with no improvement. States she feels like there are worms crawling all over her hair and body. States "see this grey stuff in my hair, it is worms."  Pt denies pain anywhere. NO SI or HI.   Past Medical History  Diagnosis Date  . Anxiety   . Hypothyroidism   . Bipolar disorder   . History of indigestion    Past Surgical History  Procedure Laterality Date  . Joint replacement      bilat knee  . Carpel tunnel    . Tonsillectomy    . Total knee revision  05/01/2012    Procedure: TOTAL KNEE REVISION;  Surgeon: Raymon Mutton, MD;  Location: [facility] OR;  Service: Orthopedics;  Laterality: Right;   No family history on file. History  Substance Use Topics  . Smoking status: Former Games developer  . Smokeless tobacco: Not on file  . Alcohol Use: No   OB History   Grav Para Term Preterm Abortions TAB SAB Ect Mult Living                 Review of Systems  Constitutional: Negative for fever and chills.  HENT: Negative for neck pain and neck stiffness.   Respiratory: Negative.   Cardiovascular: Negative.   Genitourinary: Positive for dysuria.  Skin: Negative.   Neurological: Negative for headaches.  Psychiatric/Behavioral: Positive for hallucinations. Negative for suicidal ideas. The patient is nervous/anxious.     Allergies  Review of patient's allergies indicates no known allergies.  Home Medications   Current Outpatient Rx  Name  Route  Sig   Dispense  Refill  . clonazePAM (KLONOPIN) 0.5 MG tablet   Oral   Take 0.5-1 mg by mouth 3 (three) times daily. Pt takes 1 tablet in the morning, 1 tablet at noon,  and  2 tabs at bedtime.         . hydrOXYzine (ATARAX/VISTARIL) 25 MG tablet   Oral   Take 2 tablets (50 mg total) by mouth every 6 (six) hours as needed for itching.   30 tablet   0   . levothyroxine (SYNTHROID, LEVOTHROID) 125 MCG tablet   Oral   Take 125 mcg by mouth daily.         Marland Kitchen lithium carbonate (LITHOBID) 300 MG CR tablet   Oral   Take 600 mg by mouth 2 (two) times daily.         . Multiple Vitamins-Minerals (MULTIVITAMIN WITH MINERALS) tablet   Oral   Take 1 tablet by mouth daily.          BP 108/75  Pulse 80  Temp(Src) 98.9 F (37.2 C) (Oral)  Resp 20  SpO2 98% Physical Exam  Nursing note and vitals reviewed. Constitutional: She is oriented to person, place, and time. She appears well-developed and well-nourished. No distress.  HENT:  Head: Normocephalic.  Right Ear: External ear normal.  Left Ear: External ear normal.  Nose: Nose normal.  Mouth/Throat: Oropharynx is clear and  moist.  Eyes: Conjunctivae are normal. Pupils are equal, round, and reactive to light.  Neck: Neck supple.  Cardiovascular: Normal rate, regular rhythm and normal heart sounds.   Pulmonary/Chest: Effort normal and breath sounds normal. No respiratory distress. She has no wheezes. She has no rales.  Abdominal: Soft. Bowel sounds are normal. She exhibits no distension. There is no tenderness. There is no rebound.  Musculoskeletal: She exhibits no edema.  Neurological: She is alert and oriented to person, place, and time.  Skin: Skin is warm and dry.  Psychiatric: Her speech is normal. Her mood appears anxious. She is actively hallucinating. Thought content is paranoid and delusional. She expresses no homicidal and no suicidal ideation.  Pt appears tearful, anxious. Pt constantly brushing her comb through hair and  saying "look there is a worm."    ED Course  Procedures (including critical care time)  Results for orders placed during the hospital encounter of 06/10/13  CBC WITH DIFFERENTIAL      Result Value Range   WBC 6.2  4.0 - 10.5 K/uL   RBC 4.56  3.87 - 5.11 MIL/uL   Hemoglobin 11.8 (*) 12.0 - 15.0 g/dL   HCT 47.8  29.5 - 62.1 %   MCV 79.8  78.0 - 100.0 fL   MCH 25.9 (*) 26.0 - 34.0 pg   MCHC 32.4  30.0 - 36.0 g/dL   RDW 30.8  65.7 - 84.6 %   Platelets 298  150 - 400 K/uL   Neutrophils Relative % 66  43 - 77 %   Neutro Abs 4.1  1.7 - 7.7 K/uL   Lymphocytes Relative 20  12 - 46 %   Lymphs Abs 1.2  0.7 - 4.0 K/uL   Monocytes Relative 12  3 - 12 %   Monocytes Absolute 0.7  0.1 - 1.0 K/uL   Eosinophils Relative 2  0 - 5 %   Eosinophils Absolute 0.1  0.0 - 0.7 K/uL   Basophils Relative 0  0 - 1 %   Basophils Absolute 0.0  0.0 - 0.1 K/uL  COMPREHENSIVE METABOLIC PANEL      Result Value Range   Sodium 139  135 - 145 mEq/L   Potassium 3.2 (*) 3.5 - 5.1 mEq/L   Chloride 104  96 - 112 mEq/L   CO2 27  19 - 32 mEq/L   Glucose, Bld 91  70 - 99 mg/dL   BUN 6  6 - 23 mg/dL   Creatinine, Ser 9.62  0.50 - 1.10 mg/dL   Calcium 95.2  8.4 - 84.1 mg/dL   Total Protein 7.8  6.0 - 8.3 g/dL   Albumin 3.7  3.5 - 5.2 g/dL   AST 21  0 - 37 U/L   ALT 16  0 - 35 U/L   Alkaline Phosphatase 78  39 - 117 U/L   Total Bilirubin 0.3  0.3 - 1.2 mg/dL   GFR calc non Af Amer 89 (*) >90 mL/min   GFR calc Af Amer >90  >90 mL/min  URINALYSIS W MICROSCOPIC + REFLEX CULTURE      Result Value Range   Color, Urine YELLOW  YELLOW   APPearance CLEAR  CLEAR   Specific Gravity, Urine 1.012  1.005 - 1.030   pH 6.5  5.0 - 8.0   Glucose, UA NEGATIVE  NEGATIVE mg/dL   Hgb urine dipstick NEGATIVE  NEGATIVE   Bilirubin Urine NEGATIVE  NEGATIVE   Ketones, ur NEGATIVE  NEGATIVE mg/dL   Protein,  ur NEGATIVE  NEGATIVE mg/dL   Urobilinogen, UA 0.2  0.0 - 1.0 mg/dL   Nitrite NEGATIVE  NEGATIVE   Leukocytes, UA NEGATIVE   NEGATIVE   WBC, UA 0-2  <3 WBC/hpf   Squamous Epithelial / LPF RARE  RARE  LITHIUM LEVEL      Result Value Range   Lithium Lvl 0.87  0.80 - 1.40 mEq/L   No results found.   1. Pruritic, psychogenic     MDM  Pt in ED complaining of itching all over. Las obtained, unremarkable. No rash noted, skin is dry and flaky in places. Suspect psychosomatic vs dry skin. Advised to continue vistaril for itching, try moisturising cream twice a day. Pt voiced understanding and agreeable to the plan. Pt is otherwise non toxic. VS normal. Pt denies SI or HI. D/c home with follow up with her doctor.   Filed Vitals:   06/10/13 1527  BP: 108/75  Pulse: 80  Temp: 98.9 F (37.2 C)  TempSrc: Oral  Resp: 20  SpO2: 98%     Claudell Rhody A Rashanna Christiana, PA-C 06/11/13 0002

## 2013-06-11 ENCOUNTER — Encounter (HOSPITAL_COMMUNITY): Payer: Self-pay | Admitting: *Deleted

## 2013-06-11 ENCOUNTER — Emergency Department (HOSPITAL_COMMUNITY)
Admission: EM | Admit: 2013-06-11 | Discharge: 2013-06-11 | Disposition: A | Discharge status: Home / Self Care | Payer: Medicare HMO | Attending: Emergency Medicine | Admitting: Emergency Medicine

## 2013-06-11 DIAGNOSIS — R443 Hallucinations, unspecified: Secondary | ICD-10-CM | POA: Insufficient documentation

## 2013-06-11 DIAGNOSIS — F319 Bipolar disorder, unspecified: Secondary | ICD-10-CM | POA: Insufficient documentation

## 2013-06-11 DIAGNOSIS — E039 Hypothyroidism, unspecified: Secondary | ICD-10-CM | POA: Insufficient documentation

## 2013-06-11 DIAGNOSIS — Z8719 Personal history of other diseases of the digestive system: Secondary | ICD-10-CM | POA: Insufficient documentation

## 2013-06-11 DIAGNOSIS — Z87891 Personal history of nicotine dependence: Secondary | ICD-10-CM | POA: Insufficient documentation

## 2013-06-11 DIAGNOSIS — F411 Generalized anxiety disorder: Secondary | ICD-10-CM | POA: Insufficient documentation

## 2013-06-11 DIAGNOSIS — Z79899 Other long term (current) drug therapy: Secondary | ICD-10-CM | POA: Insufficient documentation

## 2013-06-11 DIAGNOSIS — F458 Other somatoform disorders: Secondary | ICD-10-CM | POA: Insufficient documentation

## 2013-06-11 MED ORDER — DIPHENHYDRAMINE-ZINC ACETATE 2-0.1 % EX CREA: TOPICAL_CREAM | Freq: Three times a day (TID) | CUTANEOUS | Status: DC | PRN

## 2013-06-11 NOTE — ED Notes (Signed)
Pt states she "has worms all over her legs, rectum and head, pt states "they are all in my clothes". Pt yelling in room for nurse about worms being all over her. Pt states her legs/thighs and rectum itch because of worms.

## 2013-06-11 NOTE — ED Provider Notes (Signed)
History    This chart was scribed for Sharilyn Sites, PA working with Ashby Dawes, MD by Quintella Reichert, ED Scribe. This patient was seen in room WTR4/WLPT4 and the patient's care was started at 6:40 PM.   CSN: [id number]  Arrival date & time 06/11/13  1746    Chief Complaint  Patient presents with  . Pruritis    The history is provided by the patient and medical records. No language interpreter was used.    HPI Comments: Brenda Murray is a 62 y.o. female with h/o bipolar disorder, anxiety and hypothyroidism who presents to the Emergency Department complaining of several weeks of gradually-worsening generalized pruritis and tactile hallucinations.  Pt reports that she can feel "worms" all over her body including in her hair, her shoes, on her stomach, her chest, her legs and her rectum.  She states that these worms are causing her to itch.  This is her 3rd ED visit within the past week for the same.  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  At her first visit she was prescribed Atarax, which she has been taking without improvement.  She was seen again yesterday and advised to continue using Atarax and to try moisturizing cream and to f/u with her PCP.  She has not yet sought treatment for her symptoms outside of the ED.  Pt does not have pets at home.  No changes in soaps or detergents. She denies pain, visible rash or any other associated symptoms.  Denies SI/HI.   Past Medical History  Diagnosis Date  . Anxiety   . Hypothyroidism   . Bipolar disorder   . History of indigestion     Past Surgical History  Procedure Laterality Date  . Joint replacement      bilat knee  . Carpel tunnel    . Tonsillectomy    . Total knee revision  05/01/2012    Procedure: TOTAL KNEE REVISION;  Surgeon: Raymon Mutton, MD;  Location: [facility] OR;  Service: Orthopedics;  Laterality: Right;    No  family history on file.   History  Substance Use Topics  . Smoking status: Former Games developer  . Smokeless tobacco: Not on file  . Alcohol Use: No    OB History   Grav Para Term Preterm Abortions TAB SAB Ect Mult Living                  Review of Systems  Psychiatric/Behavioral: Positive for hallucinations. The patient is nervous/anxious.   All other systems reviewed and are negative.      Allergies  Review of patient's allergies indicates no known allergies.  Home Medications   Current Outpatient Rx  Name  Route  Sig  Dispense  Refill  . clonazePAM (KLONOPIN) 0.5 MG tablet   Oral   Take 0.5-1 mg by mouth 3 (three) times daily. Pt takes 1 tablet in the morning, 1 tablet at noon,  and  2 tabs at bedtime.         . hydrOXYzine (ATARAX/VISTARIL) 25 MG tablet   Oral   Take 2 tablets (50 mg total) by mouth every 6 (six) hours as needed for itching.   30 tablet   0   . levothyroxine (SYNTHROID, LEVOTHROID) 125 MCG tablet   Oral   Take 125 mcg by mouth daily.         Marland Kitchen lithium carbonate (LITHOBID) 300 MG  CR tablet   Oral   Take 600 mg by mouth 2 (two) times daily.         . Multiple Vitamins-Minerals (MULTIVITAMIN WITH MINERALS) tablet   Oral   Take 1 tablet by mouth daily.          BP 117/71  Pulse 85  Temp(Src) 98.5 F (36.9 C) (Oral)  Resp 18  SpO2 100%  Physical Exam  Nursing note and vitals reviewed. Constitutional: She is oriented to person, place, and time. She appears well-developed and well-nourished.  HENT:  Head: Normocephalic and atraumatic.  Mouth/Throat: Oropharynx is clear and moist.  Eyes: Conjunctivae and EOM are normal. Pupils are equal, round, and reactive to light.  Neck: Normal range of motion. Neck supple.  Cardiovascular: Normal rate, regular rhythm and normal heart sounds.   Pulmonary/Chest: Effort normal and breath sounds normal.  Musculoskeletal: Normal range of motion.  Neurological: She is alert and oriented to person,  place, and time.  Skin: Skin is warm and dry.  Psychiatric: Her mood appears anxious. She is actively hallucinating. Thought content is paranoid and delusional. She expresses no homicidal and no suicidal ideation. She expresses no suicidal plans and no homicidal plans.  Pt pointing to grey hairs and various places on her body where she identifies "worms"-- there are no visible worms, lesions, or bites of any kind    ED Course  Procedures (including critical care time)  DIAGNOSTIC STUDIES: Oxygen Saturation is 100% on room air, normal by my interpretation.    COORDINATION OF CARE: 6:46 PM-Discussed treatment plan which includes topical cream with pt at bedside and pt agreed to plan.    Labs Reviewed - No data to display  No results found.  1. Psychogenic pruritus     MDM   Patient continues to complain of generalized pruritus without a source. She had a complete workup yesterday including blood work and u/a, all of which was largely within normal limits. Lithium level was also normal. I feel that her pruritis is psychogenic in nature and i do not feel that further work up is indicated at this time.  Patient continues to deny SI/HI and I do not think she is a candidate for inpatient psychiatric treatment at this time. I've instructed her to followup with her primary care physician and discuss her concerns. I will give her extra strength Benadryl cream as Atarax is making her drowsy.  Discussed plan with pt, she agreed.  Return precautions advised.  I personally performed the services described in this documentation, which was scribed in my presence. The recorded information has been reviewed and is accurate.    Garlon Hatchet, PA-C 06/11/13 1915

## 2013-06-12 NOTE — ED Provider Notes (Signed)
Medical screening examination/treatment/procedure(s) were conducted as a shared visit with non-physician practitioner(s) and myself.  I personally evaluated the patient during the encounter   Ashby Dawes, MD 06/12/13 [phone]

## 2013-06-14 NOTE — ED Provider Notes (Signed)
Medical screening examination/treatment/procedure(s) were performed by non-physician practitioner and as supervising physician I was immediately available for consultation/collaboration.  Regena Delucchi, MD 06/14/13 2328 

## 2013-06-16 ENCOUNTER — Encounter (HOSPITAL_COMMUNITY): Payer: Self-pay | Admitting: Emergency Medicine

## 2013-06-16 ENCOUNTER — Emergency Department (HOSPITAL_COMMUNITY)
Admission: EM | Admit: 2013-06-16 | Discharge: 2013-06-16 | Disposition: A | Discharge status: Home / Self Care | Payer: Medicare HMO | Attending: Emergency Medicine | Admitting: Emergency Medicine

## 2013-06-16 DIAGNOSIS — Z79899 Other long term (current) drug therapy: Secondary | ICD-10-CM | POA: Insufficient documentation

## 2013-06-16 DIAGNOSIS — F411 Generalized anxiety disorder: Secondary | ICD-10-CM | POA: Insufficient documentation

## 2013-06-16 DIAGNOSIS — Z8719 Personal history of other diseases of the digestive system: Secondary | ICD-10-CM | POA: Insufficient documentation

## 2013-06-16 DIAGNOSIS — Z9889 Other specified postprocedural states: Secondary | ICD-10-CM | POA: Insufficient documentation

## 2013-06-16 DIAGNOSIS — F319 Bipolar disorder, unspecified: Secondary | ICD-10-CM | POA: Insufficient documentation

## 2013-06-16 DIAGNOSIS — R630 Anorexia: Secondary | ICD-10-CM | POA: Insufficient documentation

## 2013-06-16 DIAGNOSIS — L299 Pruritus, unspecified: Secondary | ICD-10-CM | POA: Insufficient documentation

## 2013-06-16 DIAGNOSIS — E039 Hypothyroidism, unspecified: Secondary | ICD-10-CM | POA: Insufficient documentation

## 2013-06-16 DIAGNOSIS — Z87891 Personal history of nicotine dependence: Secondary | ICD-10-CM | POA: Insufficient documentation

## 2013-06-16 DIAGNOSIS — Z96659 Presence of unspecified artificial knee joint: Secondary | ICD-10-CM | POA: Insufficient documentation

## 2013-06-16 DIAGNOSIS — IMO0001 Reserved for inherently not codable concepts without codable children: Secondary | ICD-10-CM | POA: Insufficient documentation

## 2013-06-16 MED ORDER — CHLORHEXIDINE GLUCONATE 0.12 % MT SOLN: 15.0000 mL | Freq: Two times a day (BID) | OROMUCOSAL | Status: DC

## 2013-06-16 MED ORDER — DIPHENHYDRAMINE HCL 25 MG PO TABS: 25.0000 mg | ORAL_TABLET | Freq: Four times a day (QID) | ORAL | Status: DC

## 2013-06-16 MED ORDER — ZOLPIDEM TARTRATE 5 MG PO TABS: 5.0000 mg | ORAL_TABLET | Freq: Every evening | ORAL | Status: DC | PRN

## 2013-06-16 NOTE — ED Notes (Signed)
EMS reports pt recently self treated for pinworms she saw in her bed. Pt itching all over, pain in mouth and unable to eat. Tongue with white coating reported by EMS

## 2013-06-16 NOTE — ED Notes (Signed)
Pt sts "I need something done for my body, I'm tired of being sick."  However, Pt can not elaborate and just sts "I feel bad."  Pt c/o not sleeping and white coating on tongue.  White coating noted.  Pt denies poor appetite and pain.

## 2013-06-16 NOTE — ED Notes (Signed)
MD at bedside. 

## 2013-06-16 NOTE — ED Provider Notes (Addendum)
CSN: [id number]     Arrival date & time 06/16/13  1025 History     First MD Initiated Contact with Patient 06/16/13 1032     Chief Complaint  Patient presents with  . Pruritis  . Poor appetite    (Consider location/radiation/quality/duration/timing/severity/associated sxs/prior Treatment) The history is provided by the patient.   patient reports an ongoing sensation of itching and feels like she still has some bugs on her.  She states she was recently treated for pinworms.  She reports a foul taste in her mouth and discomfort in her mouth.  She denies dental pain.  She denies fevers and chills.  No chest pain shortness of breath.  No abdominal pain nausea vomiting or diarrhea.  She's been seen several times in the past several weeks regarding ongoing itching.  She followed up with her primary care physician several days ago was told to return to the office in 5 days.  The patient has an appointment coming up.  She states her doctor did not know why she was itching either.  No prior history of cirrhosis.  She's not taking anything for the itch.  She states this does keep her up at night she's having difficulty sleeping.  She states that's making her entire body ache.  No other complaints.  She states she's been urinating normally.  She also reports to me that she's been eating normally and ate breakfast today.  It is noted in the nursing notes that she's "unable to be" however this does not seem to be the case when I spoke with her.  Past Medical History  Diagnosis Date  . Anxiety   . Hypothyroidism   . Bipolar disorder   . History of indigestion    Past Surgical History  Procedure Laterality Date  . Joint replacement      bilat knee  . Carpel tunnel    . Tonsillectomy    . Total knee revision  05/01/2012    Procedure: TOTAL KNEE REVISION;  Surgeon: Raymon Mutton, MD;  Location: [facility] OR;  Service: Orthopedics;  Laterality: Right;   No family history on file. History  Substance Use  Topics  . Smoking status: Former Games developer  . Smokeless tobacco: Not on file  . Alcohol Use: No   OB History   Grav Para Term Preterm Abortions TAB SAB Ect Mult Living                 Review of Systems  All other systems reviewed and are negative.    Allergies  Review of patient's allergies indicates no known allergies.  Home Medications   Current Outpatient Rx  Name  Route  Sig  Dispense  Refill  . clonazePAM (KLONOPIN) 0.5 MG tablet   Oral   Take 0.5-1 mg by mouth 3 (three) times daily. Pt takes 1 tablet in the morning, 1 tablet at noon,  and  2 tabs at bedtime.         . diphenhydrAMINE (BENADRYL) 25 MG tablet   Oral   Take 1 tablet (25 mg total) by mouth every 6 (six) hours.   20 tablet   0   . diphenhydrAMINE-zinc acetate (BENADRYL EXTRA STRENGTH) cream   Topical   Apply topically 3 (three) times daily as needed for itching.   28.4 g   0   . hydrOXYzine (ATARAX/VISTARIL) 25 MG tablet   Oral   Take 2 tablets (50 mg total) by mouth every 6 (six) hours as needed for  itching.   30 tablet   0   . levothyroxine (SYNTHROID, LEVOTHROID) 125 MCG tablet   Oral   Take 125 mcg by mouth daily.         Marland Kitchen lithium carbonate (LITHOBID) 300 MG CR tablet   Oral   Take 600 mg by mouth 2 (two) times daily.         . Multiple Vitamins-Minerals (MULTIVITAMIN WITH MINERALS) tablet   Oral   Take 1 tablet by mouth daily.         Marland Kitchen zolpidem (AMBIEN) 5 MG tablet   Oral   Take 1 tablet (5 mg total) by mouth at bedtime as needed for sleep.   7 tablet   0    BP 106/65  Pulse 75  Temp(Src) 97.9 F (36.6 C) (Oral)  Resp 18  SpO2 98% Physical Exam  Nursing note and vitals reviewed. Constitutional: She is oriented to person, place, and time. She appears well-developed and well-nourished. No distress.  HENT:  Head: Normocephalic and atraumatic.  No dental abnormalities.  Oral airway patent.  Posterior pharynx normal in appearance  Eyes: EOM are normal.  Neck: Normal  range of motion. Neck supple.  Cardiovascular: Normal rate, regular rhythm and normal heart sounds.   Pulmonary/Chest: Effort normal and breath sounds normal.  Abdominal: Soft. She exhibits no distension. There is no tenderness.  Musculoskeletal: Normal range of motion.  Neurological: She is alert and oriented to person, place, and time.  Skin: Skin is warm and dry. No rash noted. No erythema.  No signs of excoriations  Psychiatric: She has a normal mood and affect. Judgment normal.    ED Course   Procedures (including critical care time)  Labs Reviewed - No data to display No results found. 1. Pruritus     MDM  Medical screening examination completed.  No life-threatening emergency.  Home with Ambien for sleep, Benadryl for itch, Peridex wash for mouth discomfort.   Lyanne Co, MD 06/16/13 1104  Lyanne Co, MD 06/16/13 [phone]

## 2013-06-16 NOTE — ED Notes (Signed)
[id number]<YN> Expected date:06/16/13<BR> Expected time:10:24 AM<BR> Means of arrival:<BR> Comments:<BR> Poor appetite, ? thrush

## 2013-06-17 ENCOUNTER — Encounter (HOSPITAL_COMMUNITY): Payer: Self-pay

## 2013-06-17 ENCOUNTER — Emergency Department (HOSPITAL_COMMUNITY)
Admission: EM | Admit: 2013-06-17 | Discharge: 2013-06-17 | Disposition: A | Discharge status: Home / Self Care | Payer: Medicare HMO | Attending: Emergency Medicine | Admitting: Emergency Medicine

## 2013-06-17 DIAGNOSIS — J3489 Other specified disorders of nose and nasal sinuses: Secondary | ICD-10-CM | POA: Insufficient documentation

## 2013-06-17 DIAGNOSIS — E039 Hypothyroidism, unspecified: Secondary | ICD-10-CM | POA: Insufficient documentation

## 2013-06-17 DIAGNOSIS — F319 Bipolar disorder, unspecified: Secondary | ICD-10-CM | POA: Insufficient documentation

## 2013-06-17 DIAGNOSIS — Z87891 Personal history of nicotine dependence: Secondary | ICD-10-CM | POA: Insufficient documentation

## 2013-06-17 DIAGNOSIS — J029 Acute pharyngitis, unspecified: Secondary | ICD-10-CM | POA: Insufficient documentation

## 2013-06-17 DIAGNOSIS — F411 Generalized anxiety disorder: Secondary | ICD-10-CM | POA: Insufficient documentation

## 2013-06-17 DIAGNOSIS — Z8719 Personal history of other diseases of the digestive system: Secondary | ICD-10-CM | POA: Insufficient documentation

## 2013-06-17 DIAGNOSIS — L299 Pruritus, unspecified: Secondary | ICD-10-CM | POA: Insufficient documentation

## 2013-06-17 DIAGNOSIS — Z79899 Other long term (current) drug therapy: Secondary | ICD-10-CM | POA: Insufficient documentation

## 2013-06-17 DIAGNOSIS — H9209 Otalgia, unspecified ear: Secondary | ICD-10-CM | POA: Insufficient documentation

## 2013-06-17 MED ORDER — ANTIPYRINE-BENZOCAINE 5.4-1.4 % OT SOLN
3.0000 [drp] | Freq: Once | OTIC | Status: AC
  Administered 2013-06-17: 4 [drp] via OTIC
  Filled 2013-06-17: qty 10

## 2013-06-17 MED ORDER — MAGIC MOUTHWASH
5.0000 mL | Freq: Once | ORAL | Status: AC
  Administered 2013-06-17: 5 mL via ORAL
  Filled 2013-06-17: qty 5

## 2013-06-17 NOTE — ED Provider Notes (Signed)
CSN: [id number]     Arrival date & time 06/17/13  1558 History  This chart was scribed for Junious Silk, PA-C working with Hurman Horn, MD by Greggory Stallion, ED scribe. This patient was seen in room WTR7/WTR7 and the patient's care was started at 5:10 PM.   Chief Complaint  Patient presents with  . Pruritis  . Sore Throat   The history is provided by the patient. No language interpreter was used.    HPI Comments: Brenda Murray is a 62 y.o. female brought to ED by EMS who presents to the Emergency Department complaining of gradual onset, constant sore throat, rhinorrhea and itching that started yesterday. Pt states she also has left otalgia. She was seen here yesterday for the same symptoms. Pt states her mouth is still hurting. Pt states she didn't get the mouth wash she was prescribed because she didn't have the money. She's going to try to get it tomorrow. Pt states she keeps seeing things in her clothes that make her itch. She states she saw a doctor yesterday and tried to tell him about it.   Past Medical History  Diagnosis Date  . Anxiety   . Hypothyroidism   . Bipolar disorder   . History of indigestion    Past Surgical History  Procedure Laterality Date  . Joint replacement      bilat knee  . Carpel tunnel    . Tonsillectomy    . Total knee revision  05/01/2012    Procedure: TOTAL KNEE REVISION;  Surgeon: Raymon Mutton, MD;  Location: [facility] OR;  Service: Orthopedics;  Laterality: Right;   No family history on file. History  Substance Use Topics  . Smoking status: Former Games developer  . Smokeless tobacco: Not on file  . Alcohol Use: No   OB History   Grav Para Term Preterm Abortions TAB SAB Ect Mult Living                 Review of Systems  HENT: Positive for ear pain, sore throat and rhinorrhea.   All other systems reviewed and are negative.    Allergies  Review of patient's allergies indicates no known allergies.  Home Medications   Current Outpatient Rx   Name  Route  Sig  Dispense  Refill  . chlorhexidine (PERIDEX) 0.12 % solution   Mouth/Throat   Use as directed 15 mLs in the mouth or throat 2 (two) times daily.   120 mL   0   . clonazePAM (KLONOPIN) 0.5 MG tablet   Oral   Take 0.5-1 mg by mouth 3 (three) times daily. Pt takes 1 tablet in the morning, 1 tablet at noon,  and  2 tabs at bedtime.         . diphenhydrAMINE (BENADRYL) 25 MG tablet   Oral   Take 1 tablet (25 mg total) by mouth every 6 (six) hours.   20 tablet   0   . diphenhydrAMINE-zinc acetate (BENADRYL EXTRA STRENGTH) cream   Topical   Apply topically 3 (three) times daily as needed for itching.   28.4 g   0   . hydrOXYzine (ATARAX/VISTARIL) 25 MG tablet   Oral   Take 2 tablets (50 mg total) by mouth every 6 (six) hours as needed for itching.   30 tablet   0   . ibuprofen (ADVIL,MOTRIN) 200 MG tablet   Oral   Take 400 mg by mouth every 6 (six) hours as needed for pain.         Marland Kitchen  levothyroxine (SYNTHROID, LEVOTHROID) 125 MCG tablet   Oral   Take 125 mcg by mouth daily.         Marland Kitchen lithium carbonate (LITHOBID) 300 MG CR tablet   Oral   Take 600 mg by mouth 2 (two) times daily.         . Multiple Vitamins-Minerals (MULTIVITAMIN WITH MINERALS) tablet   Oral   Take 1 tablet by mouth daily.         Marland Kitchen omeprazole (PRILOSEC) 20 MG capsule   Oral   Take 40 mg by mouth daily.         Marland Kitchen pyrantel pamoate 50 MG/ML SUSP   Oral   Take 11 mg/kg by mouth daily.         Marland Kitchen zolpidem (AMBIEN) 5 MG tablet   Oral   Take 1 tablet (5 mg total) by mouth at bedtime as needed for sleep.   7 tablet   0    BP 116/68  Pulse 77  Temp(Src) 98.8 F (37.1 C) (Oral)  Resp 18  SpO2 100%  Physical Exam  Nursing note and vitals reviewed. Constitutional: She is oriented to person, place, and time. She appears well-developed and well-nourished. No distress.  HENT:  Head: Normocephalic and atraumatic.  Right Ear: External ear normal.  Left Ear: External ear  normal.  Nose: Nose normal.  Mouth/Throat: Oropharynx is clear and moist.  Eyes: Conjunctivae are normal.  Neck: Normal range of motion.  Cardiovascular: Normal rate, regular rhythm and normal heart sounds.   Pulmonary/Chest: Effort normal and breath sounds normal. No stridor. No respiratory distress. She has no wheezes. She has no rales.  Abdominal: Soft. She exhibits no distension.  Musculoskeletal: Normal range of motion.  Neurological: She is alert and oriented to person, place, and time. She has normal strength.  Skin: Skin is warm and dry. She is not diaphoretic. No erythema.  Psychiatric: She has a normal mood and affect. Her behavior is normal.    ED Course   Procedures (including critical care time)  DIAGNOSTIC STUDIES: Oxygen Saturation is 100% on RA, normal by my interpretation.    COORDINATION OF CARE: 5:50 PM-Discussed treatment plan which includes mouth wash in the ED and ear drops with pt at bedside and pt agreed to plan.   Labs Reviewed - No data to display No results found. 1. Pruritus     MDM  Patient seen multiple times for the same. She has not filled any of her prescriptions. Given magic mouthwash and auralgan in ED. Discussed that she needs to fill rxs given by prior providers. She will fill prescriptions tomorrow and follow up with her PCP on Monday. Vital signs stable for discharge.   Medications  magic mouthwash (5 mLs Oral Given 06/17/13 1818)  antipyrine-benzocaine (AURALGAN) otic solution 3-4 drop (4 drops Both Ears Given 06/17/13 1818)       I personally performed the services described in this documentation, which was scribed in my presence. The recorded information has been reviewed and is accurate.    Mora Bellman, PA-C 06/17/13 2042  Mora Bellman, PA-C 06/17/13 2042

## 2013-06-17 NOTE — ED Notes (Signed)
Per EMS- seen here yesterday for presenting c/o. Here today for the same.  No new symptoms.

## 2013-06-19 NOTE — ED Provider Notes (Signed)
Medical screening examination/treatment/procedure(s) were performed by non-physician practitioner and as supervising physician I was immediately available for consultation/collaboration.  Hurman Horn, MD 06/19/13 2029

## 2013-06-21 ENCOUNTER — Emergency Department (HOSPITAL_COMMUNITY)
Admission: EM | Admit: 2013-06-21 | Discharge: 2013-06-22 | Disposition: A | Discharge status: Other Acute Inpatient Hospital | Payer: Medicare HMO | Attending: Dermatology | Admitting: Dermatology

## 2013-06-21 ENCOUNTER — Encounter (HOSPITAL_COMMUNITY): Payer: Self-pay

## 2013-06-21 DIAGNOSIS — F411 Generalized anxiety disorder: Secondary | ICD-10-CM | POA: Insufficient documentation

## 2013-06-21 DIAGNOSIS — F319 Bipolar disorder, unspecified: Secondary | ICD-10-CM | POA: Insufficient documentation

## 2013-06-21 DIAGNOSIS — F315 Bipolar disorder, current episode depressed, severe, with psychotic features: Secondary | ICD-10-CM

## 2013-06-21 DIAGNOSIS — F3130 Bipolar disorder, current episode depressed, mild or moderate severity, unspecified: Secondary | ICD-10-CM

## 2013-06-21 DIAGNOSIS — Z79899 Other long term (current) drug therapy: Secondary | ICD-10-CM | POA: Insufficient documentation

## 2013-06-21 DIAGNOSIS — Z87891 Personal history of nicotine dependence: Secondary | ICD-10-CM | POA: Insufficient documentation

## 2013-06-21 DIAGNOSIS — Z8719 Personal history of other diseases of the digestive system: Secondary | ICD-10-CM | POA: Insufficient documentation

## 2013-06-21 DIAGNOSIS — E039 Hypothyroidism, unspecified: Secondary | ICD-10-CM | POA: Insufficient documentation

## 2013-06-21 LAB — CBC
MCHC: 32.2 g/dL (ref 30.0–36.0)
RDW: 13.6 % (ref 11.5–15.5)

## 2013-06-21 LAB — RAPID URINE DRUG SCREEN, HOSP PERFORMED
Amphetamines: NOT DETECTED
Benzodiazepines: NOT DETECTED
Cocaine: NOT DETECTED

## 2013-06-21 LAB — ETHANOL: Alcohol, Ethyl (B): <11 mg/dL (ref 0–11)

## 2013-06-21 LAB — COMPREHENSIVE METABOLIC PANEL
ALT: 20 U/L (ref 0–35)
Albumin: 3.4 g/dL — ABNORMAL LOW (ref 3.5–5.2)
Alkaline Phosphatase: 78 U/L (ref 39–117)
BUN: 4 mg/dL — ABNORMAL LOW (ref 6–23)
Potassium: 2.9 mEq/L — ABNORMAL LOW (ref 3.5–5.1)
Sodium: 136 mEq/L (ref 135–145)
Total Protein: 7.5 g/dL (ref 6.0–8.3)

## 2013-06-21 LAB — LITHIUM LEVEL: Lithium Lvl: 1.46 mEq/L — ABNORMAL HIGH (ref 0.80–1.40)

## 2013-06-21 MED ORDER — POTASSIUM CHLORIDE CRYS ER 20 MEQ PO TBCR
40.0000 meq | EXTENDED_RELEASE_TABLET | Freq: Once | ORAL | Status: AC
  Administered 2013-06-21: 40 meq via ORAL
  Filled 2013-06-21: qty 2

## 2013-06-21 NOTE — ED Notes (Signed)
AVW:[id number]<[id number]> Expected date:<BR> Expected time:<BR> Means of arrival:<BR> Comments:<BR> Ems-anxiety

## 2013-06-21 NOTE — BHH Counselor (Signed)
Pt to remain in the ED overnight for observation pending a  re-valuated by psychiatry/midlevel tomorrow 06/22/2013

## 2013-06-21 NOTE — ED Notes (Signed)
Psych MD at bedside

## 2013-06-21 NOTE — ED Provider Notes (Addendum)
CSN: [id number]     Arrival date & time 06/21/13  0911 History     First MD Initiated Contact with Patient 06/21/13 0932     Chief Complaint  Patient presents with  . Anxiety   (Consider location/radiation/quality/duration/timing/severity/associated sxs/prior Treatment) Patient is a 62 y.o. female presenting with anxiety. The history is provided by the patient. The history is limited by the condition of the patient.  Anxiety Pertinent negatives include no chest pain, no abdominal pain, no headaches and no shortness of breath.   Pt w hx bipolar disorder c/o feeling anxious, unsettled for the past several days.  Pt very difficult historian - level 5 caveat.  Pt denies specific exacerbating or alleviating factors. States is compliant w her normal meds x hasnt taken today.  Denies any unusual stressors. On review EPIC, appears to have been to ED in past week, several times w variety c/o.    Past Medical History  Diagnosis Date  . Anxiety   . Hypothyroidism   . Bipolar disorder   . History of indigestion    Past Surgical History  Procedure Laterality Date  . Joint replacement      bilat knee  . Carpel tunnel    . Tonsillectomy    . Total knee revision  05/01/2012    Procedure: TOTAL KNEE REVISION;  Surgeon: Raymon Mutton, MD;  Location: [facility] OR;  Service: Orthopedics;  Laterality: Right;   History reviewed. No pertinent family history. History  Substance Use Topics  . Smoking status: Former Games developer  . Smokeless tobacco: Not on file  . Alcohol Use: No   OB History   Grav Para Term Preterm Abortions TAB SAB Ect Mult Living                 Review of Systems  Unable to perform ROS: Psychiatric disorder  Constitutional: Negative for fever.  Respiratory: Negative for shortness of breath.   Cardiovascular: Negative for chest pain.  Gastrointestinal: Negative for vomiting, abdominal pain and diarrhea.  Genitourinary: Negative for dysuria.  Neurological: Negative for headaches.   level 5 caveat.   Allergies  Review of patient's allergies indicates no known allergies.  Home Medications   Current Outpatient Rx  Name  Route  Sig  Dispense  Refill  . antipyrine-benzocaine (AURALGAN) otic solution   Both Ears   Place 3 drops into both ears at bedtime.         . clonazePAM (KLONOPIN) 0.5 MG tablet   Oral   Take 0.5-1 mg by mouth 3 (three) times daily. Pt takes 1 tablet in the morning, 1 tablet at noon,  and  2 tabs at bedtime.         . diphenhydrAMINE (BENADRYL) 25 MG tablet   Oral   Take 1 tablet (25 mg total) by mouth every 6 (six) hours.   20 tablet   0   . hydrOXYzine (ATARAX/VISTARIL) 25 MG tablet   Oral   Take 2 tablets (50 mg total) by mouth every 6 (six) hours as needed for itching.   30 tablet   0   . ibuprofen (ADVIL,MOTRIN) 200 MG tablet   Oral   Take 400 mg by mouth every 6 (six) hours as needed for pain.         Marland Kitchen levothyroxine (SYNTHROID, LEVOTHROID) 125 MCG tablet   Oral   Take 125 mcg by mouth daily.         Marland Kitchen lithium carbonate (LITHOBID) 300 MG CR tablet  Oral   Take 600 mg by mouth 2 (two) times daily.         . Multiple Vitamins-Minerals (MULTIVITAMIN WITH MINERALS) tablet   Oral   Take 1 tablet by mouth daily.         Marland Kitchen omeprazole (PRILOSEC) 20 MG capsule   Oral   Take 40 mg by mouth daily.         Marland Kitchen zolpidem (AMBIEN) 5 MG tablet   Oral   Take 1 tablet (5 mg total) by mouth at bedtime as needed for sleep.   7 tablet   0    BP 119/71  Pulse 92  Temp(Src) 98.7 F (37.1 C) (Oral)  Resp 18  SpO2 97% Physical Exam  Nursing note and vitals reviewed. Constitutional: She appears well-developed and well-nourished. No distress.  HENT:  Head: Atraumatic.  Mouth/Throat: Oropharynx is clear and moist.  Eyes: Conjunctivae are normal. Pupils are equal, round, and reactive to light. No scleral icterus.  Neck: Neck supple. No tracheal deviation present. No thyromegaly present.  Cardiovascular: Normal rate,  regular rhythm, normal heart sounds and intact distal pulses.   Pulmonary/Chest: Effort normal and breath sounds normal. No respiratory distress.  Abdominal: Soft. Normal appearance and bowel sounds are normal. She exhibits no distension. There is no tenderness.  Genitourinary:  No cva tenderness  Musculoskeletal: She exhibits no edema and no tenderness.  Neurological: She is alert. No cranial nerve deficit.  Motor intact bi. Ambulates w steady gait.   Skin: Skin is warm and dry. No rash noted. She is not diaphoretic.  Psychiatric:  Mildly anxious.     ED Course   Procedures (including critical care time)  Results for orders placed during the hospital encounter of 06/21/13  CBC      Result Value Range   WBC 5.6  4.0 - 10.5 K/uL   RBC 4.23  3.87 - 5.11 MIL/uL   Hemoglobin 10.8 (*) 12.0 - 15.0 g/dL   HCT 16.1 (*) 09.6 - 04.5 %   MCV 79.2  78.0 - 100.0 fL   MCH 25.5 (*) 26.0 - 34.0 pg   MCHC 32.2  30.0 - 36.0 g/dL   RDW 40.9  81.1 - 91.4 %   Platelets 350  150 - 400 K/uL  COMPREHENSIVE METABOLIC PANEL      Result Value Range   Sodium 136  135 - 145 mEq/L   Potassium 2.9 (*) 3.5 - 5.1 mEq/L   Chloride 99  96 - 112 mEq/L   CO2 29  19 - 32 mEq/L   Glucose, Bld 111 (*) 70 - 99 mg/dL   BUN 4 (*) 6 - 23 mg/dL   Creatinine, Ser 7.82  0.50 - 1.10 mg/dL   Calcium 95.6  8.4 - 21.3 mg/dL   Total Protein 7.5  6.0 - 8.3 g/dL   Albumin 3.4 (*) 3.5 - 5.2 g/dL   AST 23  0 - 37 U/L   ALT 20  0 - 35 U/L   Alkaline Phosphatase 78  39 - 117 U/L   Total Bilirubin 0.4  0.3 - 1.2 mg/dL   GFR calc non Af Amer 90 (*) >90 mL/min   GFR calc Af Amer >90  >90 mL/min  ETHANOL      Result Value Range   Alcohol, Ethyl (B) <11  0 - 11 mg/dL  URINE RAPID DRUG SCREEN (HOSP PERFORMED)      Result Value Range   Opiates NONE DETECTED  NONE DETECTED  Cocaine NONE DETECTED  NONE DETECTED   Benzodiazepines NONE DETECTED  NONE DETECTED   Amphetamines NONE DETECTED  NONE DETECTED   Tetrahydrocannabinol  NONE DETECTED  NONE DETECTED   Barbiturates NONE DETECTED  NONE DETECTED  LITHIUM LEVEL      Result Value Range   Lithium Lvl 1.46 (*) 0.80 - 1.40 mEq/L     MDM  Labs.  Reviewed nursing notes and prior charts for additional history.   kcl po.  Discussed w act team given several recent visits w anxiety-related symptoms, hx bipolar, so that they provide psychiatry eval.   Recheck pt calm and alert. Awaiting act/psych eval.   Act team indicates accepted to [facility], Dr Andria Meuse. Pt alert, content, eating/drinking, nad, stable for transfer.   Suzi Roots, MD 06/21/13 1412    Suzi Roots, MD 06/22/13 [phone]

## 2013-06-21 NOTE — ED Notes (Addendum)
Per EMS, Pt, from home/Independent Living facility, c/o anxiety this morning.  Denies pain.  A & Ox4.  Vitals are stable.  Hx of Bi-Polar disorder and anxiety.  Pt did not take Lithium this morning.

## 2013-06-21 NOTE — BH Assessment (Signed)
Assessment Note   Brenda Murray is an 62 y.o. female with  Hx of  bipolar disorder. Today she presents stating she is feeling anxious ongoing x several days. Pt is a poor history and most information was obtained by her son. Per psychiatry notes:  "Ms Scheck was Fine until about Mother's Day. Since then she has had multiple somatic complaints which is unlike her usual self. Her mother died in 2013-10-12and she misses her a lot. Misses walking and talking with her daily. Her mother's birthday was in June. She has been thinking more about her she says and has not really grieved properly according to her son. She cries especially at night when she especially misses her mother. Her anxiety has also increased and apparently the itching comes with the anxiety. She has been thinking that something is crawling on her to cause the itching so she has been scrubbing the house,throwing things out,washing herself to no avail. Benadryl and Vistaril have not helped the itching much. She feels like she is getting worse because nothing has helped. Her psychiatric provider will not return her calls."  Pt denies specific exacerbating or alleviating factors. She denies SI and HI. She also denies AVH's, however; per prior ED notes she has a history of seeing & hearing things. Pt denies depression and acknowledges symptoms related to anxiety only. Patient calmed while clinician talked to her. She said that she is to see her psychiatrist (Dr. Lindaann Slough?) for medication management.  Patient is reluctant to return to her residence tonight but said she could in the morning. Pt reportedly lives in a assistant living facility. Dr. Ladona Ridgel recommended patient to remain in the ED for further observations.   Axis I: Generalized Anxiety Disorder  Axis II: Deferred  Axis III:  Past Medical History   Diagnosis  Date   .  Anxiety    .  Hypothyroidism    .  Bipolar disorder    .  History of indigestion      Past Medical History:   Past Medical History  Diagnosis Date  . Anxiety   . Hypothyroidism   . Bipolar disorder   . History of indigestion     Past Surgical History  Procedure Laterality Date  . Joint replacement      bilat knee  . Carpel tunnel    . Tonsillectomy    . Total knee revision  05/01/2012    Procedure: TOTAL KNEE REVISION;  Surgeon: Raymon Mutton, MD;  Location: [facility] OR;  Service: Orthopedics;  Laterality: Right;    Family History: History reviewed. No pertinent family history.  Social History:  reports that she has quit smoking. She does not have any smokeless tobacco history on file. She reports that she does not drink alcohol or use illicit drugs.  Additional Social History:  Alcohol / Drug Use Pain Medications: SEE MAR Prescriptions: SEE MAR Over the Counter: SEE MAR History of alcohol / drug use?: No history of alcohol / drug abuse  CIWA: CIWA-Ar BP: 119/71 mmHg Pulse Rate: 92 COWS:    Allergies: No Known Allergies  Home Medications:  (Not in a hospital admission)  OB/GYN Status:  No LMP recorded. Patient is postmenopausal.  General Assessment Data Location of Assessment: [facility] ED Living Arrangements: Alone Can pt return to current living arrangement?: Yes Admission Status: Voluntary Is patient capable of signing voluntary admission?: Yes Transfer from: Acute Hospital Referral Source: Self/Family/Friend     Risk to self Suicidal Ideation: No  Suicidal Intent: No Is patient at risk for suicide?: No Suicidal Plan?: No Access to Means: No What has been your use of drugs/alcohol within the last 12 months?:  (n/a) Previous Attempts/Gestures: No How many times?:  (0) Other Self Harm Risks:  (n/a) Triggers for Past Attempts: Other (Comment) (no previous attempts/gestures) Intentional Self Injurious Behavior: None Family Suicide History: Unknown Recent stressful life event(s): Other (Comment) ("I can't stop itching") Persecutory voices/beliefs?: No Depression:  No Depression Symptoms: Feeling angry/irritable;Fatigue;Loss of interest in usual pleasures;Feeling worthless/self pity Substance abuse history and/or treatment for substance abuse?: No Suicide prevention information given to non-admitted patients: Not applicable  Risk to Others Homicidal Ideation: No Thoughts of Harm to Others: No Current Homicidal Intent: No Current Homicidal Plan: No Access to Homicidal Means: No Identified Victim:  (n/a) History of harm to others?: No Assessment of Violence: None Noted Violent Behavior Description:  (patient is calm and cooperative) Does patient have access to weapons?: No Criminal Charges Pending?: No Does patient have a court date: No  Psychosis Hallucinations: None noted Delusions: None noted  Mental Status Report Appear/Hygiene: Disheveled Eye Contact: Good Motor Activity: Freedom of movement Speech: Logical/coherent Level of Consciousness: Alert Mood: Depressed Affect: Appropriate to circumstance Anxiety Level: None Thought Processes: Coherent Judgement: Unimpaired Orientation: Person;Place;Time;Situation Obsessive Compulsive Thoughts/Behaviors: None  Cognitive Functioning Concentration: Decreased Memory: Remote Intact;Recent Intact IQ: Average Insight: Fair Impulse Control: Fair Appetite: Poor Weight Loss:  (none reported) Weight Gain:  (none reported) Sleep: Decreased Total Hours of Sleep:  (0) Vegetative Symptoms: None  ADLScreening [facility] Assessment Services) Patient's cognitive ability adequate to safely complete daily activities?: Yes Patient able to express need for assistance with ADLs?: Yes Independently performs ADLs?: No  Abuse/Neglect [facility]) Physical Abuse: Denies Verbal Abuse: Denies Sexual Abuse: Denies  Prior Inpatient Therapy Prior Inpatient Therapy: No Prior Therapy Dates:  (n/a) Prior Therapy Facilty/Provider(s):  (n/a) Reason for Treatment:  (n/a)  Prior Outpatient Therapy Prior Outpatient  Therapy: No Prior Therapy Dates:  (n/a) Prior Therapy Facilty/Provider(s):  (n/a) Reason for Treatment:  (n/a)  ADL Screening (condition at time of admission) Patient's cognitive ability adequate to safely complete daily activities?: Yes Is the patient deaf or have difficulty hearing?: No Does the patient have difficulty seeing, even when wearing glasses/contacts?: No Does the patient have difficulty concentrating, remembering, or making decisions?: Yes Patient able to express need for assistance with ADLs?: Yes Does the patient have difficulty dressing or bathing?: No Independently performs ADLs?: No Communication: Independent Dressing (OT): Independent Grooming: Independent Feeding: Independent Bathing: Independent Toileting: Independent In/Out Bed: Independent Walks in Home: Independent with device (comment) Does the patient have difficulty walking or climbing stairs?: Yes Weakness of Legs: Both Weakness of Arms/Hands: None  Home Assistive Devices/Equipment Home Assistive Devices/Equipment: None    Abuse/Neglect Assessment (Assessment to be complete while patient is alone) Physical Abuse: Denies Verbal Abuse: Denies Sexual Abuse: Denies Exploitation of patient/patient's resources: Denies Self-Neglect: Denies Values / Beliefs Cultural Requests During Hospitalization: None Spiritual Requests During Hospitalization: None   Advance Directives (For Healthcare) Advance Directive: Patient does not have advance directive Nutrition Screen- [facility] Adult/[facility] Patient's home diet: Regular  Additional Information 1:1 In Past 12 Months?: No CIRT Risk: No Elopement Risk: No Does patient have medical clearance?: Yes     Disposition:  Disposition Initial Assessment Completed for this Encounter: Yes Disposition of Patient: Other dispositions (Per Dr. Ladona Ridgel pt to remain in the ED overnight;re-eval. 8/1) Other disposition(s): Other (Comment) (Pt to re-evaluated in the am by Dr.  Ladona Ridgel)  On Site Evaluation by:   Reviewed with Physician:     Melynda Ripple [facility] 06/21/2013 12:18 PM

## 2013-06-21 NOTE — ED Notes (Signed)
Pt sts she usually uses a walker at home, but this RN will attempt to ambulate Pt after she eats.

## 2013-06-21 NOTE — ED Notes (Addendum)
Pt sts she called EMS because she "was nervous, scared and frightened."  Pt can not elaborate.  Sts she has been taking her Bi-Polar medication as scheduled.  Pt, also, sts that she is "burning up," but is afebrile.    Pt sts she didn't take medication this morning, because she didn't eat breakfast.  Sts she could eat due to anxiety.

## 2013-06-21 NOTE — ED Notes (Signed)
Per Selena Batten, with Case Management, the Psychiatrist has decided that the Pt needs in Pt treatment.  NT currently changing Pt into blue scrubs.

## 2013-06-21 NOTE — ED Notes (Signed)
Attempted to walk Pt to the restroom.  Pt needed help getting in ad out of bed and was unable to stand without assistance.

## 2013-06-21 NOTE — ED Notes (Signed)
Pt's son called and sts Pt can normally walk w/o difficulty.

## 2013-06-21 NOTE — Consult Note (Signed)
[facility] Psychiatry Consult   Reason for Consult:  Complains of itching and thinks bugs or something is crawling on her Referring Physician:  ER MD  [name] is an 62 y.o. female.  Assessment: AXIS I:  Bipolar Disorder with depression  AXIS II:  Deferred AXIS III:   Past Medical History  Diagnosis Date  . Anxiety   . Hypothyroidism   . Bipolar disorder   . History of indigestion    AXIS IV:  other psychosocial or environmental problems and mother died 9 months ago  AXIS V:  51-60 moderate symptoms  Plan:  No evidence of imminent risk to self or others at present.   Supportive therapy provided about ongoing stressors. hold overnight as she has had 5 ER visits in one week  Subjective:   Brenda Murray is a 62 y.o. female patient admitted with anxiety, itching and depression/grieving.  HPI:  [name] was  Fine until about Mother's Day.  Since then she has had multiple somatic complaints which is unlike her usual self.  Her mother died in 10/23/13and she misses her a lot.  Misses walking and talking with her daily.  Her mother's birthday was in June.  She has been thinking more about her she says and has not really grieved properly according to her son.  She cries especially at night when she especially misses her mother.  Her anxiety has also increased and apparently the itching comes with the anxiety.  She has been thinking that something is crawling on her to cause the itching so she has been scrubbing the house,throwing things out,washing herself to no avail.  Benadryl and Vistaril have not helped the itching much.  She feels like she is getting worse because nothing has helped.  Her psychiatric provider will not return her calls. HPI Elements:   Location:  ER. Quality:  feels anxious all the time. Severity:  moderate. Timing:  worse when trying to sleep. Duration:  since her mother died in Oct 23, 2013and worse since Mother's Day. Context:  as above.  Past Psychiatric  History: Past Medical History  Diagnosis Date  . Anxiety   . Hypothyroidism   . Bipolar disorder   . History of indigestion     reports that she has quit smoking. She does not have any smokeless tobacco history on file. She reports that she does not drink alcohol or use illicit drugs. History reviewed. No pertinent family history.         Allergies:  No Known Allergies  Past Psychiatric History: Diagnosis:  Bipolar disorder with depression  Hospitalizations:  none  Outpatient Care:  Sees a provider  Substance Abuse Care:  none  Self-Mutilation:  none  Suicidal Attempts:  none  Violent Behaviors:  none   Objective: Blood pressure 119/71, pulse 92, temperature 98.7 F (37.1 C), temperature source Oral, resp. rate 18, SpO2 97.00%.There is no weight on file to calculate BMI. Results for orders placed during the hospital encounter of 06/21/13 (from the past 72 hour(s))  URINE RAPID DRUG SCREEN (HOSP PERFORMED)     Status: None   Collection Time    06/21/13 10:25 AM      Result Value Range   Opiates NONE DETECTED  NONE DETECTED   Cocaine NONE DETECTED  NONE DETECTED   Benzodiazepines NONE DETECTED  NONE DETECTED   Amphetamines NONE DETECTED  NONE DETECTED   Tetrahydrocannabinol NONE DETECTED  NONE DETECTED   Barbiturates NONE DETECTED  NONE DETECTED  Comment:            DRUG SCREEN FOR MEDICAL PURPOSES     ONLY.  IF CONFIRMATION IS NEEDED     FOR ANY PURPOSE, NOTIFY LAB     WITHIN 5 DAYS.                LOWEST DETECTABLE LIMITS     FOR URINE DRUG SCREEN     Drug Class       Cutoff (ng/mL)     Amphetamine      1000     Barbiturate      200     Benzodiazepine   200     Tricyclics       300     Opiates          300     Cocaine          300     THC              50  CBC     Status: Abnormal   Collection Time    06/21/13 10:36 AM      Result Value Range   WBC 5.6  4.0 - 10.5 K/uL   RBC 4.23  3.87 - 5.11 MIL/uL   Hemoglobin 10.8 (*) 12.0 - 15.0 g/dL   HCT 14.7 (*)  82.9 - 46.0 %   MCV 79.2  78.0 - 100.0 fL   MCH 25.5 (*) 26.0 - 34.0 pg   MCHC 32.2  30.0 - 36.0 g/dL   RDW 56.2  13.0 - 86.5 %   Platelets 350  150 - 400 K/uL  COMPREHENSIVE METABOLIC PANEL     Status: Abnormal   Collection Time    06/21/13 10:36 AM      Result Value Range   Sodium 136  135 - 145 mEq/L   Potassium 2.9 (*) 3.5 - 5.1 mEq/L   Chloride 99  96 - 112 mEq/L   CO2 29  19 - 32 mEq/L   Glucose, Bld 111 (*) 70 - 99 mg/dL   BUN 4 (*) 6 - 23 mg/dL   Creatinine, Ser 7.84  0.50 - 1.10 mg/dL   Calcium 69.6  8.4 - 29.5 mg/dL   Total Protein 7.5  6.0 - 8.3 g/dL   Albumin 3.4 (*) 3.5 - 5.2 g/dL   AST 23  0 - 37 U/L   ALT 20  0 - 35 U/L   Alkaline Phosphatase 78  39 - 117 U/L   Total Bilirubin 0.4  0.3 - 1.2 mg/dL   GFR calc non Af Amer 90 (*) >90 mL/min   GFR calc Af Amer >90  >90 mL/min   Comment:            The eGFR has been calculated     using the CKD EPI equation.     This calculation has not been     validated in all clinical     situations.     eGFR's persistently     <90 mL/min signify     possible Chronic Kidney Disease.  ETHANOL     Status: None   Collection Time    06/21/13 10:36 AM      Result Value Range   Alcohol, Ethyl (B) <11  0 - 11 mg/dL   Comment:            LOWEST DETECTABLE LIMIT FOR     SERUM ALCOHOL IS 11 mg/dL  FOR MEDICAL PURPOSES ONLY  LITHIUM LEVEL     Status: Abnormal   Collection Time    06/21/13 10:36 AM      Result Value Range   Lithium Lvl 1.46 (*) 0.80 - 1.40 mEq/L   Labs are reviewed and are pertinent for elevated Lithium and decreased potassium.  No current facility-administered medications for this encounter.   Current Outpatient Prescriptions  Medication Sig Dispense Refill  . antipyrine-benzocaine (AURALGAN) otic solution Place 3 drops into both ears at bedtime.      . clonazePAM (KLONOPIN) 0.5 MG tablet Take 0.5-1 mg by mouth 3 (three) times daily. Pt takes 1 tablet in the morning, 1 tablet at noon,  and  2 tabs at  bedtime.      . diphenhydrAMINE (BENADRYL) 25 MG tablet Take 1 tablet (25 mg total) by mouth every 6 (six) hours.  20 tablet  0  . hydrOXYzine (ATARAX/VISTARIL) 25 MG tablet Take 2 tablets (50 mg total) by mouth every 6 (six) hours as needed for itching.  30 tablet  0  . ibuprofen (ADVIL,MOTRIN) 200 MG tablet Take 400 mg by mouth every 6 (six) hours as needed for pain.      Marland Kitchen levothyroxine (SYNTHROID, LEVOTHROID) 125 MCG tablet Take 125 mcg by mouth daily.      Marland Kitchen lithium carbonate (LITHOBID) 300 MG CR tablet Take 600 mg by mouth 2 (two) times daily.      . Multiple Vitamins-Minerals (MULTIVITAMIN WITH MINERALS) tablet Take 1 tablet by mouth daily.      Marland Kitchen omeprazole (PRILOSEC) 20 MG capsule Take 40 mg by mouth daily.      Marland Kitchen zolpidem (AMBIEN) 5 MG tablet Take 1 tablet (5 mg total) by mouth at bedtime as needed for sleep.  7 tablet  0    Psychiatric Specialty Exam:     Blood pressure 119/71, pulse 92, temperature 98.7 F (37.1 C), temperature source Oral, resp. rate 18, SpO2 97.00%.There is no weight on file to calculate BMI.  General Appearance: Casual  Eye Contact::  Good  Speech:  Clear and Coherent and Normal Rate  Volume:  Normal  Mood:  Anxious  Affect:  Appropriate  Thought Process:  Intact  Orientation:  Full (Time, Place, and Person)  Thought Content:  Negative  Suicidal Thoughts:  No  Homicidal Thoughts:  No  Memory:  Immediate;   Good Recent;   Good Remote;   Good  Judgement:  Good  Insight:  Fair  Psychomotor Activity:  Normal  Concentration:  Good  Recall:  Good  Akathisia:  Negative  Handed:  Right  AIMS (if indicated):     Assets:  Communication Skills Desire for Improvement Financial Resources/Insurance Housing Leisure Time Physical Health Resilience Social Support Transportation Vocational/Educational  Sleep:      Treatment Plan Summary: Daily contact with patient to assess and evaluate symptoms and progress in treatment Medication management DC  morning dose of lithium hold overnight to see if she improves   TAYLOR,GERALD D 06/21/2013 11:38 AM  Hold tonight's dose of lithium as well due to elevated level.  Continue the zolpidem 5mg  hs and clonazepam 0.5 mg bid and 0ne mg hs

## 2013-06-22 ENCOUNTER — Inpatient Hospital Stay (HOSPITAL_COMMUNITY): Admission: AD | Admit: 2013-06-22 | Payer: Medicare Other | Source: Ambulatory Visit | Admitting: Psychiatry

## 2013-06-22 LAB — POTASSIUM: Potassium: 3.3 mEq/L — ABNORMAL LOW (ref 3.5–5.1)

## 2013-06-22 MED ORDER — ZOLPIDEM TARTRATE 5 MG PO TABS: 5.0000 mg | ORAL_TABLET | Freq: Every evening | ORAL | Status: DC | PRN

## 2013-06-22 MED ORDER — LEVOTHYROXINE SODIUM 125 MCG PO TABS
125.0000 ug | ORAL_TABLET | Freq: Every day | ORAL | Status: DC
  Administered 2013-06-22: 125 ug via ORAL
  Filled 2013-06-22 (×2): qty 1

## 2013-06-22 MED ORDER — LITHIUM CARBONATE ER 300 MG PO TBCR
600.0000 mg | EXTENDED_RELEASE_TABLET | Freq: Two times a day (BID) | ORAL | Status: DC
  Filled 2013-06-22 (×2): qty 2

## 2013-06-22 MED ORDER — QUETIAPINE FUMARATE 25 MG PO TABS
25.0000 mg | ORAL_TABLET | Freq: Two times a day (BID) | ORAL | Status: DC
  Administered 2013-06-22: 25 mg via ORAL
  Filled 2013-06-22: qty 1

## 2013-06-22 MED ORDER — CLONAZEPAM 0.5 MG PO TABS: 0.5000 mg | ORAL_TABLET | Freq: Three times a day (TID) | ORAL | Status: DC

## 2013-06-22 MED ORDER — HYDROXYZINE HCL 25 MG PO TABS: 50.0000 mg | ORAL_TABLET | Freq: Four times a day (QID) | ORAL | Status: DC | PRN

## 2013-06-22 MED ORDER — PANTOPRAZOLE SODIUM 40 MG PO TBEC
40.0000 mg | DELAYED_RELEASE_TABLET | Freq: Every day | ORAL | Status: DC
  Administered 2013-06-22: 40 mg via ORAL
  Filled 2013-06-22 (×2): qty 1

## 2013-06-22 NOTE — ED Notes (Signed)
Pt belongings consist of 1 pair of navy blue capripants ,1 pink blouse, 1 pair of gold shoes, 1bra, 1pair of underwear, 1 white wallet with EBT card, keys, bank of [place] visa ( wallet locked up with security in box 1). Belongings bag put in locker 41 in psych ed.

## 2013-06-22 NOTE — ED Notes (Signed)
Pt is awake and alert, pleasant and cooperative. Patient denies HI, SI, AH or VH. Called report to [facility] to [employer] @ 1145. Discharge vitals 128/80 temp 98.5 HR 79 RR 16 and unlabored. Pt has intpatient treatment scheduled by MD Andria Meuse at [facility]. Pt is transported by [facility]. 1 bag of belongs and medications was given to EMTs. Safety monitored and maintained. Patient escorted by transport company without incident. T.Melvyn Neth RN

## 2013-06-22 NOTE — ED Notes (Signed)
.  dc

## 2013-06-22 NOTE — Progress Notes (Addendum)
Patient accepted to [facility] 404-2 to Dr. Jannifer Franklin. CSW completed support paperwork and faxed to [facility]. Patien tto be transported by security. EDP and RN aware. Per discussion with nurse and psychiatrist, patient has somatic symptoms but is able to ambulate and sit up. Per chart review, patient is from an assisted living, however other notes states she lives at home. Per hard copy chart review, no information from a facility that is usually sent with a patient. Pt unable to tell me much information. CSW left message for patient son.   Brenda Murray  [phone] 06/22/2013 9:31am    Addendum: Per further discussion with [facility], concerns regarding patient difficulty ambulating. CSW referring to [place] and Berton Lan for [employer].   CSW spoke with patient, who states she lives in assisted living with two ladies Brenda Murray?? However unable to understand. Patient reports address is [address] But unable to find.   CSW awaiting return call from patient son.   Catha Gosselin, LCSWA  [phone] .06/22/2013 9:48am

## 2013-06-22 NOTE — Progress Notes (Signed)
Pt accepted to [facility] to Dr. Zonia Kief, room number [phone], rn can call report to [phone]. Patient will be transferred by [facility] voluntarily.   Catha Gosselin, LCSWA  [phone]  .06/22/2013 11:32 am

## 2013-06-22 NOTE — BH Assessment (Signed)
[facility] Assessment Progress Note  Per Berneice Heinrich, RN, Administrative Coordinator, pt has been accepted to [facility] by Dahlia Byes, NP.  Pt assigned to Rm 404-2 to the service of Thedore Mins, MD.  At 08:50 I called [facility] to inform the pt's nurse.  I asked that she have pt sign Voluntary Admission and Consent for Treatment, and to send it to [facility] along with the pt.  To this she agreed.  Doylene Canning, MA Assessment Counselor 06/22/2013 @ 08:57

## 2013-06-22 NOTE — Progress Notes (Signed)
Patient ID: Brenda Murray, female   DOB: 02-Dec-1950, 62 y.o.   MRN: [id number] Saw  Patient with Dr Ladona Ridgel.  She is still worried and anxious about warms crawling down her face and itching all over her.  She touched her eyes and looking to see the warms but there was none.  She strongly believe something is crawling on her skin and her face.  She is asking for help take away the warm and she feels terrified.  She is calm and soft spoken and pleasant.  She is in agreement to come into our inpatient unit for treatment and further investigations.  Also her Lithium level is 1.46, we will decrease her Lithium from 1200 mg to 600 mg a day at Night time.  Patient will be admitted to our inpatient 400 hall unit. Diagnosis: Bipolar d/o with Psytchotic features. Dahlia Byes  PMHNP-BC

## 2013-06-22 NOTE — Progress Notes (Signed)
[facility] ED CM spoke with EDP Steinl about recheck of K level

## 2013-06-22 NOTE — Progress Notes (Signed)
Note reviewed and agreed with  

## 2013-06-22 NOTE — ED Notes (Signed)
Pt transferred from main ed to psych ed, amb.  Pt from Independent Living Facility, called EMS because she was scared and frightened.  Pt appears the same, tearful also.  Denies SI or HI, reports she sees things, will not elaborate further.  Pt cooperative at present.

## 2013-06-28 DIAGNOSIS — E538 Deficiency of other specified B group vitamins: Secondary | ICD-10-CM | POA: Insufficient documentation

## 2013-06-28 DIAGNOSIS — K219 Gastro-esophageal reflux disease without esophagitis: Secondary | ICD-10-CM | POA: Insufficient documentation

## 2013-06-28 DIAGNOSIS — L290 Pruritus ani: Secondary | ICD-10-CM | POA: Insufficient documentation

## 2013-07-04 ENCOUNTER — Encounter (HOSPITAL_COMMUNITY): Payer: Self-pay | Admitting: Emergency Medicine

## 2013-07-04 ENCOUNTER — Emergency Department (HOSPITAL_COMMUNITY)
Admission: EM | Admit: 2013-07-04 | Discharge: 2013-07-04 | Disposition: A | Discharge status: Home / Self Care | Payer: Medicare HMO | Attending: Emergency Medicine | Admitting: Emergency Medicine

## 2013-07-04 DIAGNOSIS — F411 Generalized anxiety disorder: Secondary | ICD-10-CM | POA: Insufficient documentation

## 2013-07-04 DIAGNOSIS — H9209 Otalgia, unspecified ear: Secondary | ICD-10-CM | POA: Insufficient documentation

## 2013-07-04 DIAGNOSIS — Z79899 Other long term (current) drug therapy: Secondary | ICD-10-CM | POA: Insufficient documentation

## 2013-07-04 DIAGNOSIS — Z8719 Personal history of other diseases of the digestive system: Secondary | ICD-10-CM | POA: Insufficient documentation

## 2013-07-04 DIAGNOSIS — E039 Hypothyroidism, unspecified: Secondary | ICD-10-CM | POA: Insufficient documentation

## 2013-07-04 DIAGNOSIS — R450 Nervousness: Secondary | ICD-10-CM | POA: Insufficient documentation

## 2013-07-04 DIAGNOSIS — Z87891 Personal history of nicotine dependence: Secondary | ICD-10-CM | POA: Insufficient documentation

## 2013-07-04 DIAGNOSIS — F458 Other somatoform disorders: Secondary | ICD-10-CM | POA: Insufficient documentation

## 2013-07-04 DIAGNOSIS — F319 Bipolar disorder, unspecified: Secondary | ICD-10-CM | POA: Insufficient documentation

## 2013-07-04 LAB — RAPID URINE DRUG SCREEN, HOSP PERFORMED
Barbiturates: NOT DETECTED
Tetrahydrocannabinol: NOT DETECTED

## 2013-07-04 LAB — COMPREHENSIVE METABOLIC PANEL
ALT: 14 U/L (ref 0–35)
AST: 21 U/L (ref 0–37)
CO2: 25 mEq/L (ref 19–32)
Calcium: 9.8 mg/dL (ref 8.4–10.5)
Chloride: 105 mEq/L (ref 96–112)
GFR calc Af Amer: >90 mL/min (ref >90)
GFR calc non Af Amer: 89 mL/min — ABNORMAL LOW (ref >90)
Glucose, Bld: 72 mg/dL (ref 70–99)
Sodium: 140 mEq/L (ref 135–145)
Total Bilirubin: 0.4 mg/dL (ref 0.3–1.2)

## 2013-07-04 LAB — ETHANOL: Alcohol, Ethyl (B): <11 mg/dL (ref 0–11)

## 2013-07-04 LAB — LITHIUM LEVEL: Lithium Lvl: <0.25 mEq/L — ABNORMAL LOW (ref 0.80–1.40)

## 2013-07-04 LAB — CBC WITH DIFFERENTIAL/PLATELET
Basophils Absolute: 0 10*3/uL (ref 0.0–0.1)
Eosinophils Relative: 1 % (ref 0–5)
HCT: 33.7 % — ABNORMAL LOW (ref 36.0–46.0)
Lymphocytes Relative: 28 % (ref 12–46)
Lymphs Abs: 1.1 10*3/uL (ref 0.7–4.0)
MCV: 80 fL (ref 78.0–100.0)
Monocytes Absolute: 0.5 10*3/uL (ref 0.1–1.0)
Neutro Abs: 2.3 10*3/uL (ref 1.7–7.7)
RBC: 4.21 MIL/uL (ref 3.87–5.11)
RDW: 13.9 % (ref 11.5–15.5)
WBC: 3.9 10*3/uL — ABNORMAL LOW (ref 4.0–10.5)

## 2013-07-04 LAB — URINALYSIS, ROUTINE W REFLEX MICROSCOPIC
Bilirubin Urine: NEGATIVE
Hgb urine dipstick: NEGATIVE
Ketones, ur: NEGATIVE mg/dL
Nitrite: NEGATIVE
Protein, ur: NEGATIVE mg/dL
Urobilinogen, UA: 0.2 mg/dL (ref 0.0–1.0)

## 2013-07-04 MED ORDER — MAGIC MOUTHWASH
2.0000 mL | ORAL | Status: AC
  Administered 2013-07-04: 2 mL via ORAL
  Filled 2013-07-04: qty 5

## 2013-07-04 MED ORDER — MAGIC MOUTHWASH: 2.0000 mL | Freq: Three times a day (TID) | ORAL | Status: DC | PRN

## 2013-07-04 MED ORDER — MAGIC MOUTHWASH: 1.0000 mL | Freq: Once | ORAL | Status: DC

## 2013-07-04 MED ORDER — ANTIPYRINE-BENZOCAINE 5.4-1.4 % OT SOLN
3.0000 [drp] | Freq: Once | OTIC | Status: AC
  Administered 2013-07-04: 3 [drp] via OTIC
  Filled 2013-07-04: qty 10

## 2013-07-04 NOTE — ED Notes (Signed)
C/o mouth burning head pain and arm pain states  Has been going on a long time pt is tearful but able to walk and talk w/o diff .  Will not take off pjs for me.

## 2013-07-04 NOTE — ED Provider Notes (Signed)
Brenda Murray is a 62 y.o. female presents for evaluation a burning, pain, or for the problem is recurrent. She is unable to state specific significant,  complaints.  Exam alert, elderly, female in mild discomfort. She ambulated normally. She exhibits normal range of motion of the arms, and legs, bilaterally.  Chest: Nonspecific malaise. Doubt acute or urgent medical complications or compromise. She stable for discharge with outpatient management    Medical screening examination/treatment/procedure(s) were conducted as a shared visit with non-physician practitioner(s) and myself.  I personally evaluated the patient during the encounter  Flint Melter, MD 07/05/13 1150

## 2013-07-04 NOTE — ED Notes (Addendum)
Pt refusing to get last set of vital signs and sign. Pt tearful sts "y'all dont do anything for me" RN explained what has been done. Candise Bowens- PA-C made aware RN requested psych referral for pt. sts to tell pt. To follow up with primary care for med adjustments and psych referral. PA. sts pt at baseline. RN highly encouraged pt to follow up with primary care for re-evaulation. Pt leaving room tearful refusing to tell RN how pt can be further helped. Pt denies SI/HI.

## 2013-07-04 NOTE — ED Notes (Signed)
Pt states that this is same drops she has at home . Pt crying that we are not doing anything for her

## 2013-07-04 NOTE — ED Provider Notes (Signed)
CSN: [id number]     Arrival date & time 07/04/13  4098 History     First MD Initiated Contact with Patient 07/04/13 [id number]     Chief Complaint  Patient presents with  . Arm Pain   (Consider location/radiation/quality/duration/timing/severity/associated sxs/prior Treatment) HPI Comments: Patient is a 62 yo F past medical history significant for bipolar disorder, anxiety, hypothyroidism presented to the emergency department for "burning all over" for an unknown time. Pt endorses pain "everywhere" that is a 10/10.  Pt denies specific exacerbating or alleviating factors. Patient states she lives alone. When questioned about following up with her PCP patient states "nobody sees me." On review EPIC, appears to have been to ED several times over the last few months, several times w variety c/o. Pt very difficult historian - level 5 caveat.   Patient is a 62 y.o. female presenting with arm pain.  Arm Pain    Past Medical History  Diagnosis Date  . Anxiety   . Hypothyroidism   . Bipolar disorder   . History of indigestion    Past Surgical History  Procedure Laterality Date  . Joint replacement      bilat knee  . Carpel tunnel    . Tonsillectomy    . Total knee revision  05/01/2012    Procedure: TOTAL KNEE REVISION;  Surgeon: Raymon Mutton, MD;  Location: [facility] OR;  Service: Orthopedics;  Laterality: Right;   No family history on file. History  Substance Use Topics  . Smoking status: Former Games developer  . Smokeless tobacco: Not on file  . Alcohol Use: No   OB History   Grav Para Term Preterm Abortions TAB SAB Ect Mult Living                 Review of Systems  Unable to perform ROS: Psychiatric disorder  HENT: Positive for ear pain.   Psychiatric/Behavioral: The patient is nervous/anxious.     Allergies  Review of patient's allergies indicates no known allergies.  Home Medications   Current Outpatient Rx  Name  Route  Sig  Dispense  Refill  . chlorhexidine (PERIDEX) 0.12 %  solution   Mouth/Throat   Use as directed 15 mL in the mouth or throat 2 (two) times daily.         . clonazePAM (KLONOPIN) 0.5 MG tablet   Oral   Take 0.5-1 mg by mouth 3 (three) times daily. Pt takes 1 tablet in the morning, 1 tablet at noon,  and  2 tabs at bedtime.         . diphenhydrAMINE (BENADRYL) 25 mg capsule   Oral   Take 25 mg by mouth every 6 (six) hours.         . hydrocortisone (ANUSOL-HC) 2.5 % rectal cream   Rectal   Place 1 application rectally 2 (two) times daily.         . hydrocortisone (ANUSOL-HC) 25 MG suppository   Rectal   Place 25 mg rectally 2 (two) times daily.         . hydrOXYzine (ATARAX/VISTARIL) 25 MG tablet   Oral   Take 25 mg by mouth 3 (three) times daily as needed for itching.         . levothyroxine (SYNTHROID, LEVOTHROID) 125 MCG tablet   Oral   Take 125 mcg by mouth daily.         Marland Kitchen lithium carbonate 150 MG capsule   Oral   Take 150 mg by mouth at bedtime.         Marland Kitchen  metroNIDAZOLE (FLAGYL) 500 MG tablet   Oral   Take 500 mg by mouth 3 (three) times daily.         . pantoprazole (PROTONIX) 40 MG tablet   Oral   Take 40 mg by mouth daily.         . QUEtiapine (SEROQUEL) 50 MG tablet   Oral   Take 150 mg by mouth at bedtime.         . traZODone (DESYREL) 50 MG tablet   Oral   Take 50 mg by mouth 3 times/day as needed-between meals & bedtime for sleep.         . vitamin B-12 (CYANOCOBALAMIN) 1000 MCG tablet   Oral   Take 1,000 mcg by mouth daily.         Marland Kitchen zolpidem (AMBIEN) 5 MG tablet   Oral   Take 1 tablet (5 mg total) by mouth at bedtime as needed for sleep.   7 tablet   0   . Alum & Mag Hydroxide-Simeth (MAGIC MOUTHWASH) SOLN   Oral   Take 2 mL by mouth 3 (three) times daily as needed.   10 mL   0    BP 101/88  Pulse 63  Temp(Src) 98.3 F (36.8 C) (Oral)  SpO2 100% Physical Exam  Constitutional: She is oriented to person, place, and time. She appears well-developed and  well-nourished.  Tearful  HENT:  Head: Normocephalic and atraumatic.  Right Ear: Tympanic membrane and external ear normal.  Left Ear: Tympanic membrane and external ear normal.  Mouth/Throat: Oropharynx is clear and moist. No oropharyngeal exudate.  Eyes: EOM are normal. Pupils are equal, round, and reactive to light. Right conjunctiva is injected. Left conjunctiva is injected.  Neck: Normal range of motion. Neck supple.  Cardiovascular: Normal rate, regular rhythm, normal heart sounds and intact distal pulses.   Pulmonary/Chest: Effort normal and breath sounds normal. No respiratory distress.  Abdominal: Soft. Bowel sounds are normal. There is no tenderness. There is no rebound and no guarding.  Musculoskeletal: Normal range of motion. She exhibits no edema.  Neurological: She is alert and oriented to person, place, and time.  Skin: Skin is warm and dry. She is not diaphoretic.  Psychiatric: Her mood appears anxious. She is actively hallucinating.    ED Course   Procedures (including critical care time)  Labs Reviewed  CBC WITH DIFFERENTIAL - Abnormal; Notable for the following:    WBC 3.9 (*)    Hemoglobin 11.2 (*)    HCT 33.7 (*)    Monocytes Relative 13 (*)    All other components within normal limits  COMPREHENSIVE METABOLIC PANEL - Abnormal; Notable for the following:    GFR calc non Af Amer 89 (*)    All other components within normal limits  URINALYSIS, ROUTINE W REFLEX MICROSCOPIC - Abnormal; Notable for the following:    Specific Gravity, Urine 1.003 (*)    All other components within normal limits  LITHIUM LEVEL - Abnormal; Notable for the following:    Lithium Lvl <0.25 (*)    All other components within normal limits  URINE RAPID DRUG SCREEN (HOSP PERFORMED)  ETHANOL   No results found. 1. Psychogenic pruritus     MDM  Patient continues to complain of generalized pruritus without a source. She has had several negative work ups in the last month for similar  complaints. Labs here today were all within  Normal limits with the exception of her Lithium level which was low. I  feel that her pruritis is psychogenic in nature and i do not feel that further work up is indicated at this time. Patient continues to deny SI/HI and I do not think she is a candidate for inpatient psychiatric treatment at this time. I've instructed her to followup with her primary care physician and discuss her concerns. Advised Benadryl use for pruritis. Provided Auralgan and Magic Mouthwash for otalgia and mouth pain. Discussed plan with pt, she agreed. Return precautions advised. Patient is at baseline per review of previous visits. Patient d/w with Dr. Effie Shy, agrees with plan.     Jeannetta Ellis, PA-C 07/04/13 1541

## 2013-07-11 ENCOUNTER — Other Ambulatory Visit: Payer: Self-pay

## 2013-07-11 ENCOUNTER — Encounter (HOSPITAL_COMMUNITY): Payer: Self-pay | Admitting: Emergency Medicine

## 2013-07-11 ENCOUNTER — Emergency Department (HOSPITAL_COMMUNITY)
Admission: EM | Admit: 2013-07-11 | Discharge: 2013-07-12 | Disposition: A | Discharge status: Other Acute Inpatient Hospital | Payer: Medicare HMO | Attending: Emergency Medicine | Admitting: Emergency Medicine

## 2013-07-11 DIAGNOSIS — Z79899 Other long term (current) drug therapy: Secondary | ICD-10-CM | POA: Insufficient documentation

## 2013-07-11 DIAGNOSIS — F3289 Other specified depressive episodes: Secondary | ICD-10-CM | POA: Diagnosis present

## 2013-07-11 DIAGNOSIS — F29 Unspecified psychosis not due to a substance or known physiological condition: Secondary | ICD-10-CM

## 2013-07-11 DIAGNOSIS — F411 Generalized anxiety disorder: Secondary | ICD-10-CM

## 2013-07-11 DIAGNOSIS — F419 Anxiety disorder, unspecified: Secondary | ICD-10-CM | POA: Diagnosis present

## 2013-07-11 DIAGNOSIS — F329 Major depressive disorder, single episode, unspecified: Secondary | ICD-10-CM

## 2013-07-11 DIAGNOSIS — Z96659 Presence of unspecified artificial knee joint: Secondary | ICD-10-CM | POA: Insufficient documentation

## 2013-07-11 DIAGNOSIS — R410 Disorientation, unspecified: Secondary | ICD-10-CM

## 2013-07-11 DIAGNOSIS — F3130 Bipolar disorder, current episode depressed, mild or moderate severity, unspecified: Secondary | ICD-10-CM | POA: Insufficient documentation

## 2013-07-11 DIAGNOSIS — Z8719 Personal history of other diseases of the digestive system: Secondary | ICD-10-CM | POA: Insufficient documentation

## 2013-07-11 DIAGNOSIS — Z87891 Personal history of nicotine dependence: Secondary | ICD-10-CM | POA: Insufficient documentation

## 2013-07-11 DIAGNOSIS — E039 Hypothyroidism, unspecified: Secondary | ICD-10-CM | POA: Insufficient documentation

## 2013-07-11 DIAGNOSIS — IMO0002 Reserved for concepts with insufficient information to code with codable children: Secondary | ICD-10-CM | POA: Insufficient documentation

## 2013-07-11 LAB — LITHIUM LEVEL: Lithium Lvl: <0.25 mEq/L — ABNORMAL LOW (ref 0.80–1.40)

## 2013-07-11 LAB — COMPREHENSIVE METABOLIC PANEL
ALT: 16 U/L (ref 0–35)
Albumin: 4.2 g/dL (ref 3.5–5.2)
Alkaline Phosphatase: 66 U/L (ref 39–117)
BUN: 5 mg/dL — ABNORMAL LOW (ref 6–23)
Chloride: 102 mEq/L (ref 96–112)
Glucose, Bld: 90 mg/dL (ref 70–99)
Potassium: 3.4 mEq/L — ABNORMAL LOW (ref 3.5–5.1)
Sodium: 138 mEq/L (ref 135–145)
Total Bilirubin: 0.5 mg/dL (ref 0.3–1.2)

## 2013-07-11 LAB — CBC WITH DIFFERENTIAL/PLATELET
Basophils Relative: 0 % (ref 0–1)
Hemoglobin: 12.4 g/dL (ref 12.0–15.0)
Lymphs Abs: 1.2 10*3/uL (ref 0.7–4.0)
Monocytes Relative: 16 % — ABNORMAL HIGH (ref 3–12)
Neutro Abs: 1.9 10*3/uL (ref 1.7–7.7)
Neutrophils Relative %: 51 % (ref 43–77)
RBC: 4.84 MIL/uL (ref 3.87–5.11)
WBC: 3.8 10*3/uL — ABNORMAL LOW (ref 4.0–10.5)

## 2013-07-11 LAB — RAPID URINE DRUG SCREEN, HOSP PERFORMED
Opiates: NOT DETECTED
Tetrahydrocannabinol: NOT DETECTED

## 2013-07-11 LAB — ETHANOL: Alcohol, Ethyl (B): <11 mg/dL (ref 0–11)

## 2013-07-11 MED ORDER — ACETAMINOPHEN 325 MG PO TABS: 650.0000 mg | ORAL_TABLET | ORAL | Status: DC | PRN

## 2013-07-11 MED ORDER — ONDANSETRON HCL 4 MG PO TABS: 4.0000 mg | ORAL_TABLET | Freq: Three times a day (TID) | ORAL | Status: DC | PRN

## 2013-07-11 MED ORDER — ZIPRASIDONE MESYLATE 20 MG IM SOLR
10.0000 mg | Freq: Once | INTRAMUSCULAR | Status: AC
  Administered 2013-07-11: 10 mg via INTRAMUSCULAR
  Filled 2013-07-11: qty 20

## 2013-07-11 MED ORDER — HYDROCORTISONE 2.5 % RE CREA
1.0000 | TOPICAL_CREAM | Freq: Two times a day (BID) | RECTAL | Status: DC | PRN
Start: 2013-07-11 — End: 2013-07-12
  Filled 2013-07-11: qty 28.35

## 2013-07-11 MED ORDER — LITHIUM CARBONATE 150 MG PO CAPS
150.0000 mg | ORAL_CAPSULE | Freq: Every day | ORAL | Status: DC
  Administered 2013-07-11: 150 mg via ORAL
  Filled 2013-07-11 (×3): qty 1

## 2013-07-11 MED ORDER — MAGIC MOUTHWASH
2.0000 mL | Freq: Three times a day (TID) | ORAL | Status: DC | PRN
  Filled 2013-07-11: qty 5

## 2013-07-11 MED ORDER — QUETIAPINE FUMARATE 50 MG PO TABS
150.0000 mg | ORAL_TABLET | Freq: Every day | ORAL | Status: DC
  Administered 2013-07-11: 150 mg via ORAL
  Filled 2013-07-11: qty 1

## 2013-07-11 MED ORDER — LORAZEPAM 2 MG/ML IJ SOLN
1.0000 mg | Freq: Once | INTRAMUSCULAR | Status: AC
  Administered 2013-07-11: 1 mg via INTRAMUSCULAR
  Filled 2013-07-11: qty 1

## 2013-07-11 MED ORDER — ZOLPIDEM TARTRATE 5 MG PO TABS: 5.0000 mg | ORAL_TABLET | Freq: Every evening | ORAL | Status: DC | PRN

## 2013-07-11 MED ORDER — PANTOPRAZOLE SODIUM 40 MG PO TBEC
40.0000 mg | DELAYED_RELEASE_TABLET | Freq: Every day | ORAL | Status: DC
  Administered 2013-07-11 – 2013-07-12 (×2): 40 mg via ORAL
  Filled 2013-07-11 (×2): qty 1

## 2013-07-11 MED ORDER — LEVOTHYROXINE SODIUM 125 MCG PO TABS
125.0000 ug | ORAL_TABLET | Freq: Every day | ORAL | Status: DC
  Administered 2013-07-12: 125 ug via ORAL
  Filled 2013-07-11 (×2): qty 1

## 2013-07-11 MED ORDER — CLONAZEPAM 0.5 MG PO TABS
0.5000 mg | ORAL_TABLET | Freq: Three times a day (TID) | ORAL | Status: DC
  Administered 2013-07-11 (×2): 1 mg via ORAL
  Administered 2013-07-11: 0.5 mg via ORAL
  Filled 2013-07-11 (×3): qty 1

## 2013-07-11 MED ORDER — DIPHENHYDRAMINE HCL 25 MG PO CAPS
25.0000 mg | ORAL_CAPSULE | Freq: Four times a day (QID) | ORAL | Status: DC
  Administered 2013-07-11 – 2013-07-12 (×2): 25 mg via ORAL
  Filled 2013-07-11 (×2): qty 1

## 2013-07-11 MED ORDER — POTASSIUM CHLORIDE CRYS ER 20 MEQ PO TBCR
40.0000 meq | EXTENDED_RELEASE_TABLET | Freq: Once | ORAL | Status: DC
  Filled 2013-07-11: qty 2

## 2013-07-11 MED ORDER — TRAZODONE HCL 50 MG PO TABS
50.0000 mg | ORAL_TABLET | Freq: Every evening | ORAL | Status: DC | PRN
  Administered 2013-07-11: 50 mg via ORAL
  Filled 2013-07-11: qty 1

## 2013-07-11 MED ORDER — VITAMIN B-12 1000 MCG PO TABS
1000.0000 ug | ORAL_TABLET | Freq: Every day | ORAL | Status: DC
  Administered 2013-07-12: 1000 ug via ORAL
  Filled 2013-07-11: qty 1
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
## 2013-07-11 NOTE — Progress Notes (Signed)
Call from [facility] at [place].  Dr. Zonia Kief has accepted pt to bed 94-42.  Marva Panda, Theresia Majors  [phone]  .07/11/2013

## 2013-07-11 NOTE — ED Notes (Signed)
Bed: ZO10 Expected date:  Expected time:  Means of arrival:  Comments: ems- psych eval

## 2013-07-11 NOTE — ED Notes (Signed)
MD at bedside. 

## 2013-07-11 NOTE — Consult Note (Signed)
Brenda Nagorski Barlow[id number]  Subjective:  Patient states that she is here related to being afraid of being alone and that she is confused.  Patient states that she takes her medication but cant remember to take them like she should.  Patient is oriented to person and when asked where she was looked at wall and read the sign that said [facility]; and then said is that where I am.  "I know Im in the hospital and I guess that is the one.  Patient has a bag of medication with her and states that she is unsure if she is taking it like she should.  "I can't remember."  "I want to some help; I can't get home."  Patient denies suicidal ideations, homicidal ideations, psychosis, and paranoia.     Current Medication Current facility-administered medications:acetaminophen (TYLENOL) tablet 650 mg, 650 mg, Oral, Q4H PRN, Celene Kras, MD;  clonazePAM Scarlette Calico) tablet 0.5-1 mg, 0.5-1 mg, Oral, TID, Celene Kras, MD, 0.5 mg at 07/11/13 1307;  diphenhydrAMINE (BENADRYL) capsule 25 mg, 25 mg, Oral, Q6H, Celene Kras, MD, 25 mg at 07/11/13 1318;  hydrocortisone (ANUSOL-HC) 2.5 % rectal cream 1 application, 1 application, Rectal, BID PRN, Celene Kras, MD [START ON 07/12/2013] levothyroxine (SYNTHROID, LEVOTHROID) tablet 125 mcg, 125 mcg, Oral, QAC breakfast, Celene Kras, MD;  lithium carbonate capsule 150 mg, 150 mg, Oral, QHS, Celene Kras, MD;  magic mouthwash, 2 mL, Oral, TID PRN, Celene Kras, MD;  ondansetron [facility]) tablet 4 mg, 4 mg, Oral, Q8H PRN, Celene Kras, MD;  pantoprazole (PROTONIX) EC tablet 40 mg, 40 mg, Oral, Daily, Celene Kras, MD, 40 mg at 07/11/13 1306 potassium chloride SA (K-DUR,KLOR-CON) CR tablet 40 mEq, 40 mEq, Oral, Once, Celene Kras, MD;  QUEtiapine (SEROQUEL) tablet 150 mg, 150 mg, Oral, QHS, Celene Kras, MD;  traZODone (DESYREL) tablet 50 mg, 50 mg, Oral, QHS PRN, Celene Kras, MD;  Melene Muller ON 07/12/2013] vitamin B-12 (CYANOCOBALAMIN) tablet 1,000 mcg, 1,000 mcg, Oral, Daily, Celene Kras, MD;   zolpidem (AMBIEN) tablet 5 mg, 5 mg, Oral, QHS PRN, Celene Kras, MD Current outpatient prescriptions:Alum & Mag Hydroxide-Simeth (MAGIC MOUTHWASH) SOLN, Take 2 mL by mouth 3 (three) times daily as needed., Disp: 10 mL, Rfl: 0;  antipyrine-benzocaine (AURALGAN) otic solution, Place 3 drops into both ears every 2 (two) hours as needed for pain., Disp: , Rfl: ;  chlorhexidine (PERIDEX) 0.12 % solution, Use as directed 15 mL in the mouth or throat 2 (two) times daily., Disp: , Rfl:  clonazePAM (KLONOPIN) 0.5 MG tablet, Take 0.5-1 mg by mouth 3 (three) times daily. Pt takes 1 tablet in the morning, 1 tablet at noon,  and  2 tabs at bedtime., Disp: , Rfl: ;  hydrocortisone (ANUSOL-HC) 2.5 % rectal cream, Place 1 application rectally 2 (two) times daily., Disp: , Rfl: ;  hydrOXYzine (ATARAX/VISTARIL) 25 MG tablet, Take 25 mg by mouth 3 (three) times daily as needed for itching., Disp: , Rfl:  ibuprofen (ADVIL,MOTRIN) 200 MG tablet, Take 200 mg by mouth every 6 (six) hours as needed for pain., Disp: , Rfl: ;  levothyroxine (SYNTHROID, LEVOTHROID) 125 MCG tablet, Take 125 mcg by mouth daily., Disp: , Rfl: ;  lithium carbonate 150 MG capsule, Take 150 mg by mouth at bedtime., Disp: , Rfl: ;  metroNIDAZOLE (FLAGYL) 500 MG tablet, Take 500 mg by mouth 3 (three) times daily., Disp: , Rfl:  Multiple Vitamin (MULTIVITAMIN WITH MINERALS)  TABS tablet, Take 1 tablet by mouth daily., Disp: , Rfl: ;  pantoprazole (PROTONIX) 40 MG tablet, Take 40 mg by mouth daily., Disp: , Rfl: ;  QUEtiapine (SEROQUEL) 50 MG tablet, Take 150 mg by mouth at bedtime., Disp: , Rfl: ;  traZODone (DESYREL) 50 MG tablet, Take 50 mg by mouth at bedtime as needed for sleep. , Disp: , Rfl:  vitamin B-12 (CYANOCOBALAMIN) 1000 MCG tablet, Take 1,000 mcg by mouth daily., Disp: , Rfl: ;  zolpidem (AMBIEN) 5 MG tablet, Take 1 tablet (5 mg total) by mouth at bedtime as needed for sleep., Disp: 7 tablet, Rfl: 0;  diphenhydrAMINE (BENADRYL) 25 mg capsule, Take 25  mg by mouth every 6 (six) hours., Disp: , Rfl: ;  hydrocortisone (ANUSOL-HC) 25 MG suppository, Place 25 mg rectally 2 (two) times daily., Disp: , Rfl:   Assessment @ Axis I: Anxiety Disorder NOS, Depressive Disorder NOS and Confusion Axis II: Deferred Axis III:  Past Medical History  Diagnosis Date  . Anxiety   . Hypothyroidism   . Bipolar disorder   . History of indigestion    Axis IV: other psychosocial or environmental problems and problems related to social environment Axis V: 41-50 serious symptoms    Plan Recommendation/Disposition:  Inpatient treatment at geriatric psych facility.  CW/SW to look for placement.  After discharge SW will need to look for placement assisted living facility.  Shuvon Rankin, FNP-BC I have personally seen the patient and agreed with the findings and involved in the treatment plan. Kathryne Sharper, MD

## 2013-07-11 NOTE — ED Provider Notes (Signed)
CSN: [id number]     Arrival date & time 07/11/13  0821 History   First MD Initiated Contact with Patient 07/11/13 [phone]     Chief complaint: Agitation Level V caveat: Uncooperativeness HPI The patient presents to the emergency room with complaints of anxiety and agitation.  The patient has a history of bipolar disorder and anxiety. She has required psychiatric hospitalization most recently last month for similar issues. The patient lives at an assisted living/retirement facility. According to other residents at the facility the patient was up all night yelling and screaming in the hallways. Patient did not want to come to the hospital. Eventually EMS and the police were contacted. It took them about 20 minutes to convince the patient to come to the emergency department. Here in the emergency department, the patient is constantly crying, screaming out loud, and fidgeting.  She is intermittently wringing her hands and smacking her legs. Patient will not answer any of my questions.  She requires constant coaxing distal extremity current blood pressure and pulse. The patient keeps on screaming out "lease don't do this to me." Past Medical History  Diagnosis Date  . Anxiety   . Hypothyroidism   . Bipolar disorder   . History of indigestion    Past Surgical History  Procedure Laterality Date  . Joint replacement      bilat knee  . Carpel tunnel    . Tonsillectomy    . Total knee revision  05/01/2012    Procedure: TOTAL KNEE REVISION;  Surgeon: Raymon Mutton, MD;  Location: [facility] OR;  Service: Orthopedics;  Laterality: Right;   No family history on file. History  Substance Use Topics  . Smoking status: Former Games developer  . Smokeless tobacco: Not on file  . Alcohol Use: No   OB History   Grav Para Term Preterm Abortions TAB SAB Ect Mult Living                 Review of Systems  Unable to perform ROS: Psychiatric disorder    Allergies  Review of patient's allergies indicates no known  allergies.  Home Medications   Current Outpatient Rx  Name  Route  Sig  Dispense  Refill  . Alum & Mag Hydroxide-Simeth (MAGIC MOUTHWASH) SOLN   Oral   Take 2 mL by mouth 3 (three) times daily as needed.   10 mL   0   . chlorhexidine (PERIDEX) 0.12 % solution   Mouth/Throat   Use as directed 15 mL in the mouth or throat 2 (two) times daily.         . clonazePAM (KLONOPIN) 0.5 MG tablet   Oral   Take 0.5-1 mg by mouth 3 (three) times daily. Pt takes 1 tablet in the morning, 1 tablet at noon,  and  2 tabs at bedtime.         . diphenhydrAMINE (BENADRYL) 25 mg capsule   Oral   Take 25 mg by mouth every 6 (six) hours.         . hydrocortisone (ANUSOL-HC) 2.5 % rectal cream   Rectal   Place 1 application rectally 2 (two) times daily.         . hydrocortisone (ANUSOL-HC) 25 MG suppository   Rectal   Place 25 mg rectally 2 (two) times daily.         . hydrOXYzine (ATARAX/VISTARIL) 25 MG tablet   Oral   Take 25 mg by mouth 3 (three) times daily as needed for itching.         Marland Kitchen  levothyroxine (SYNTHROID, LEVOTHROID) 125 MCG tablet   Oral   Take 125 mcg by mouth daily.         Marland Kitchen lithium carbonate 150 MG capsule   Oral   Take 150 mg by mouth at bedtime.         . metroNIDAZOLE (FLAGYL) 500 MG tablet   Oral   Take 500 mg by mouth 3 (three) times daily.         . pantoprazole (PROTONIX) 40 MG tablet   Oral   Take 40 mg by mouth daily.         . QUEtiapine (SEROQUEL) 50 MG tablet   Oral   Take 150 mg by mouth at bedtime.         . traZODone (DESYREL) 50 MG tablet   Oral   Take 50 mg by mouth 3 times/day as needed-between meals & bedtime for sleep.         . vitamin B-12 (CYANOCOBALAMIN) 1000 MCG tablet   Oral   Take 1,000 mcg by mouth daily.         Marland Kitchen zolpidem (AMBIEN) 5 MG tablet   Oral   Take 1 tablet (5 mg total) by mouth at bedtime as needed for sleep.   7 tablet   0    There were no vitals taken for this visit. Physical Exam   Nursing note and vitals reviewed. Constitutional: She appears well-developed and well-nourished. She appears distressed.  HENT:  Head: Normocephalic and atraumatic.  Right Ear: External ear normal.  Left Ear: External ear normal.  Eyes: Conjunctivae are normal. Right eye exhibits no discharge. Left eye exhibits no discharge. No scleral icterus.  Neck: Neck supple. No tracheal deviation present.  Cardiovascular: Normal rate, regular rhythm and intact distal pulses.   Pulmonary/Chest: Effort normal and breath sounds normal. No stridor. No respiratory distress. She has no wheezes. She has no rales.  Abdominal: Soft. Bowel sounds are normal. She exhibits no distension. There is no tenderness. There is no rebound and no guarding.  Musculoskeletal: She exhibits no edema and no tenderness.  Neurological: She is alert. She has normal strength. No sensory deficit. Cranial nerve deficit:  no gross defecits noted. She exhibits normal muscle tone. She displays no seizure activity. Coordination normal.  Skin: Skin is warm and dry. No rash noted.  Psychiatric: Her mood appears anxious. She is agitated and hyperactive. She expresses inappropriate judgment.    ED Course   The patient was given a dose of Geodon and Ativan to help with her agitation with some improvement. Tachycardia decreased as her agitation level diminished  Procedures (including critical care time)  Labs Reviewed  CBC WITH DIFFERENTIAL  COMPREHENSIVE METABOLIC PANEL  URINE RAPID DRUG SCREEN (HOSP PERFORMED)  ETHANOL   No results found. Diagnosis: Bipolar disorder  MDM  1141 Patient is medically stable at this point. She will be moved to the psychiatric ED for further treatment and evaluation.  I spoke with Dr Lolly Mustache earlier regarding psychiatric consultation  Celene Kras, MD 07/11/13 [phone]

## 2013-07-11 NOTE — ED Notes (Signed)
On assessment patient is agitated, anxious, disoriented, states she is dying and she "has got to go."

## 2013-07-11 NOTE — BH Assessment (Signed)
Assessment Note  Brenda Murray is an 62 y.o. female who presents to the ED after running up and down the hall in senior living community screaming. Pt reports she has not slept and feels scared. Pt alert to self and place only. Pt states she is very confused. Pt becomes very tearful when talking about it being morning. Patient reports she doesn't remember to take her medications and is scared to be alone. Pt unable to report any hallucinations, however appears very paranoid and fearful.  Patient denies SI/HI. Patient screaming out. Patient evaluated by pscyhiatrist and NP, who recommend inpatient geriatric treatment.Pt  constantly crying, screaming out loud, and fidgeting. She is intermittently wringing her hands and smacking her legs.    Axis I: Psychotic Disorder NOS Axis II: Deferred Axis III:  Past Medical History  Diagnosis Date  . Anxiety   . Hypothyroidism   . Bipolar disorder   . History of indigestion    Axis IV: housing problems, other psychosocial or environmental problems, problems related to social environment, problems with access to health care services and problems with primary support group Axis V: 21-30 behavior considerably influenced by delusions or hallucinations OR serious impairment in judgment, communication OR inability to function in almost all areas  Past Medical History:  Past Medical History  Diagnosis Date  . Anxiety   . Hypothyroidism   . Bipolar disorder   . History of indigestion     Past Surgical History  Procedure Laterality Date  . Joint replacement      bilat knee  . Carpel tunnel    . Tonsillectomy    . Total knee revision  05/01/2012    Procedure: TOTAL KNEE REVISION;  Surgeon: Raymon Mutton, MD;  Location[facility] OR;  Service: Orthopedics;  Laterality: Right;    Family History: History reviewed. No pertinent family history.  Social History:  reports that she has quit smoking. She does not have any smokeless tobacco history on file. She  reports that she does not drink alcohol or use illicit drugs.  Additional Social History:     CIWA: CIWA-Ar BP: 145/125 mmHg Pulse Rate: 66 COWS:    Allergies: No Known Allergies  Home Medications:  (Not in a hospital admission)  OB/GYN Status:  No LMP recorded. Patient is postmenopausal.  General Assessment Data Location of Assessment: [facility] ED Is this a Tele or Face-to-Face Assessment?: Face-to-Face Is this an Initial Assessment or a Re-assessment for this encounter?: Initial Assessment Living Arrangements: Alone Can pt return to current living arrangement?: Yes Admission Status: Voluntary Is patient capable of signing voluntary admission?: Yes Transfer from: Home Referral Source: Self/Family/Friend     [facility] Crisis Care Plan Living Arrangements: Alone  Education Status Is patient currently in school?: No  Risk to self Suicidal Ideation: No Suicidal Intent: No Is patient at risk for suicide?: No Suicidal Plan?: No Access to Means: No What has been your use of drugs/alcohol within the last 12 months?: no Previous Attempts/Gestures: No How many times?: 0 Other Self Harm Risks: unknown Triggers for Past Attempts: Unknown Intentional Self Injurious Behavior: None Family Suicide History: Unknown Recent stressful life event(s): Other (Comment) Persecutory voices/beliefs?: No Depression: No Substance abuse history and/or treatment for substance abuse?: No  Risk to Others Homicidal Ideation: No Thoughts of Harm to Others: No Current Homicidal Intent: No Current Homicidal Plan: No Access to Homicidal Means: No Identified Victim: n/a History of harm to others?: No Assessment of Violence: None Noted Violent Behavior Description: none Does  patient have access to weapons?: No Criminal Charges Pending?: No Does patient have a court date: No  Psychosis Hallucinations: None noted Delusions: None noted  Mental Status Report Appear/Hygiene: Disheveled Eye Contact:  Good Motor Activity: Freedom of movement Speech: Logical/coherent Level of Consciousness: Alert Mood: Anxious Affect: Anxious Anxiety Level: None Thought Processes: Coherent;Relevant Judgement: Impaired Orientation: Person;Place Obsessive Compulsive Thoughts/Behaviors: None  Cognitive Functioning Concentration: Decreased Memory: Recent Impaired;Remote Impaired IQ: Average Insight: Poor Impulse Control: Poor Appetite: Poor Sleep: Decreased Total Hours of Sleep: 0 Vegetative Symptoms: None  ADLScreening [facility] Assessment Services) Patient's cognitive ability adequate to safely complete daily activities?: Yes Patient able to express need for assistance with ADLs?: Yes Independently performs ADLs?: No  Prior Inpatient Therapy Prior Inpatient Therapy: Yes Prior Therapy Dates: 2014  Prior Therapy Facilty/Provider(s): [facility] Reason for Treatment: psychosis  Prior Outpatient Therapy Prior Outpatient Therapy: No  ADL Screening (condition at time of admission) Patient's cognitive ability adequate to safely complete daily activities?: Yes Patient able to express need for assistance with ADLs?: Yes Independently performs ADLs?: No         Values / Beliefs Cultural Requests During Hospitalization: None Spiritual Requests During Hospitalization: None        Additional Information 1:1 In Past 12 Months?: No CIRT Risk: No Elopement Risk: No Does patient have medical clearance?: Yes     Disposition:  Disposition Initial Assessment Completed for this Encounter: Yes Disposition of Patient: Inpatient treatment program Type of inpatient treatment program: Adult  On Site Evaluation by:   Reviewed with Physician:    Catha Gosselin A 07/11/2013 11:29 AM

## 2013-07-11 NOTE — Progress Notes (Signed)
CSW spoke with Herbert Seta at [facility] and review of pts information is still pending and they are waiting for a response from Dr. Zonia Kief.  CSW spoke with Nicholos Johns at [facility].  Pts information has not been reviewed.  CSW can follow up with Delorise Shiner on 07/12/13 am for progress on review.  Marva Panda, Theresia Majors  [phone]  .07/11/2013 5:10 pm

## 2013-07-11 NOTE — ED Notes (Signed)
Per EMS the patient's neighbor at the retirement facility called because patient could not sleep all night and was out in the hallway yelling things that did not make sense, patient did not want to come to the hospital. Per EMS, EMS and police worked together for 20 minutes to get patient onto stretcher and attempt vital, patient was uncooperative but "not combative."

## 2013-07-11 NOTE — Progress Notes (Addendum)
Pt recently discharged from [facility], CSW spoke with Agustin Cree at [facility] who stated patient MD will review pt information for possible readmission.   Pt referred to [facility] pending review, per [facility] beds available.  Pt referred to [facility] pending review. LM for Annice Pih at [phone]  .Frutoso Schatz [phone]  ED CSW 07/11/2013 11:36am

## 2013-07-11 NOTE — ED Notes (Signed)
Pt States she  hasnt been sleeping and is afraid because they have changed her medications and she is thinks that's whats wrong with her.

## 2013-07-11 NOTE — ED Notes (Signed)
Patient wanded by security. Ready to ambulate to room 39.

## 2013-07-11 NOTE — ED Notes (Addendum)
Patient's belongings in locker # 39.

## 2013-07-11 NOTE — Progress Notes (Signed)
Pt gave csw permission to speak with pt son. Pt son shared that he is next of kin and the primary caregiver for pt. Pt does live at home alone, however pt son visits pt daily. Pt son felt that patient left geri psych too soon before. Pt son shared that they are interested in alf placement for patient once psychiatrically stable and had discussed with [facility] before she was discharged home.   Catha Gosselin, [place] [phone]  ED CSW 07/11/2013 1413pm

## 2013-07-12 DIAGNOSIS — F3130 Bipolar disorder, current episode depressed, mild or moderate severity, unspecified: Secondary | ICD-10-CM

## 2013-07-12 MED ORDER — CLONAZEPAM 0.5 MG PO TABS
0.5000 mg | ORAL_TABLET | Freq: Three times a day (TID) | ORAL | Status: DC
  Administered 2013-07-12 (×2): 0.5 mg via ORAL
  Filled 2013-07-12 (×2): qty 1

## 2013-07-12 MED ORDER — LORAZEPAM 2 MG/ML IJ SOLN: 1.0000 mg | Freq: Once | INTRAMUSCULAR | Status: DC

## 2013-07-12 MED ORDER — HALOPERIDOL 5 MG PO TABS
2.5000 mg | ORAL_TABLET | Freq: Once | ORAL | Status: AC
  Administered 2013-07-12: 2.5 mg via ORAL

## 2013-07-12 MED ORDER — OLANZAPINE 5 MG PO TBDP: 2.5000 mg | ORAL_TABLET | Freq: Once | ORAL | Status: DC

## 2013-07-12 MED ORDER — HALOPERIDOL LACTATE 5 MG/ML IJ SOLN
INTRAMUSCULAR | Status: AC
  Filled 2013-07-12: qty 1

## 2013-07-12 MED ORDER — HALOPERIDOL LACTATE 5 MG/ML IJ SOLN: 2.5000 mg | Freq: Once | INTRAMUSCULAR | Status: AC

## 2013-07-12 NOTE — Progress Notes (Signed)
Face to face interview and consult with Dr. Ladona Ridgel  Patient continues to be confused.  Patient unable to give me family information.  Patient appears depressed with flat affect.  Will continue with current treatment plan for inpatient treatment and transfer to [facility].  Nicolo Tomko B. Tyus Kallam FNP-BC Family Nurse Practitioner, Board Certified

## 2013-07-12 NOTE — ED Notes (Signed)
Pt brought back by [facility] r/t becoming aggressive in the ambulance and unbuckling her stretcher belts. Dr. Ladona Ridgel notified of her arrival.

## 2013-07-12 NOTE — BH Assessment (Addendum)
Writer spoke w/ Agustin Cree at [facility]. # to call report is #[phone]. Accepting Dr. Zonia Kief, pt to go to room 94-42. Pt will need to be transported through main entrance, go through double doors, go to Left then pt registration on Right. PT SHOULDN'T BE TRANSPORTED THROUGH THE ED.  EDP [facility] and [employer] notified.  Evette Cristal, [place]  Assessment Counselor        Evette Cristal, [place] Assessment Counselor

## 2013-07-12 NOTE — ED Provider Notes (Signed)
Patient accepted by behavioral health at [facility] patient's voluntary. Patient transported by PTR. Excepting physician is Dr. Zonia Kief.   Brenda Jakes, MD 07/12/13 1019

## 2013-07-12 NOTE — BH Assessment (Signed)
Writer spoke w/ Agustin Cree at [facility]. # to call report is #[phone]. Accepting Dr. Zonia Kief, pt to go to room 94-42. Pt will need to be transported through main entrance, go through double doors, go to Left then pt registration on Right. PT SHOULDN'T BE TRANSPORTED THROUGH THE ED. EDP [facility] and [employer] notified.  Evette Cristal, [place] Assessment Counselor

## 2014-07-16 DIAGNOSIS — Z96659 Presence of unspecified artificial knee joint: Secondary | ICD-10-CM | POA: Insufficient documentation

## 2014-09-25 ENCOUNTER — Encounter (HOSPITAL_COMMUNITY): Payer: Self-pay

## 2014-09-25 ENCOUNTER — Emergency Department (HOSPITAL_COMMUNITY)
Admission: EM | Admit: 2014-09-25 | Discharge: 2014-09-25 | Disposition: A | Discharge status: Home / Self Care | Payer: Commercial Managed Care - HMO | Attending: Emergency Medicine | Admitting: Emergency Medicine

## 2014-09-25 DIAGNOSIS — E039 Hypothyroidism, unspecified: Secondary | ICD-10-CM | POA: Diagnosis not present

## 2014-09-25 DIAGNOSIS — Z87891 Personal history of nicotine dependence: Secondary | ICD-10-CM | POA: Diagnosis not present

## 2014-09-25 DIAGNOSIS — Z8719 Personal history of other diseases of the digestive system: Secondary | ICD-10-CM | POA: Insufficient documentation

## 2014-09-25 DIAGNOSIS — M5440 Lumbago with sciatica, unspecified side: Secondary | ICD-10-CM | POA: Insufficient documentation

## 2014-09-25 DIAGNOSIS — Z7952 Long term (current) use of systemic steroids: Secondary | ICD-10-CM | POA: Diagnosis not present

## 2014-09-25 DIAGNOSIS — M5441 Lumbago with sciatica, right side: Secondary | ICD-10-CM

## 2014-09-25 DIAGNOSIS — F319 Bipolar disorder, unspecified: Secondary | ICD-10-CM | POA: Insufficient documentation

## 2014-09-25 DIAGNOSIS — Z79899 Other long term (current) drug therapy: Secondary | ICD-10-CM | POA: Diagnosis not present

## 2014-09-25 DIAGNOSIS — Z792 Long term (current) use of antibiotics: Secondary | ICD-10-CM | POA: Diagnosis not present

## 2014-09-25 DIAGNOSIS — M5442 Lumbago with sciatica, left side: Secondary | ICD-10-CM

## 2014-09-25 DIAGNOSIS — F419 Anxiety disorder, unspecified: Secondary | ICD-10-CM | POA: Insufficient documentation

## 2014-09-25 DIAGNOSIS — M545 Low back pain: Secondary | ICD-10-CM | POA: Diagnosis present

## 2014-09-25 LAB — URINALYSIS, ROUTINE W REFLEX MICROSCOPIC
BILIRUBIN URINE: NEGATIVE
Glucose, UA: NEGATIVE mg/dL
HGB URINE DIPSTICK: NEGATIVE
Ketones, ur: NEGATIVE mg/dL
Leukocytes, UA: NEGATIVE
NITRITE: NEGATIVE
PROTEIN: NEGATIVE mg/dL
Specific Gravity, Urine: 1.012 (ref 1.005–1.030)
UROBILINOGEN UA: 0.2 mg/dL (ref 0.0–1.0)
pH: 6 (ref 5.0–8.0)

## 2014-09-25 MED ORDER — TRAMADOL HCL 50 MG PO TABS
50.0000 mg | ORAL_TABLET | Freq: Once | ORAL | Status: AC
  Administered 2014-09-25: 50 mg via ORAL
  Filled 2014-09-25: qty 1

## 2014-09-25 MED ORDER — TRAMADOL HCL 50 MG PO TABS: 50.0000 mg | ORAL_TABLET | Freq: Four times a day (QID) | ORAL | Status: DC | PRN

## 2014-09-25 MED ORDER — KETOROLAC TROMETHAMINE 60 MG/2ML IM SOLN
30.0000 mg | Freq: Once | INTRAMUSCULAR | Status: AC
  Administered 2014-09-25: 30 mg via INTRAMUSCULAR
  Filled 2014-09-25: qty 2

## 2014-09-25 NOTE — ED Notes (Signed)
Pt reports experiencing lower back pain for the past two months. Pt reports bending down to tie shoes when pain started. Pt denies change in urinary frequency. Denies pain upon urination.

## 2014-09-25 NOTE — ED Provider Notes (Signed)
CSN: [id number]     Arrival date & time 09/25/14  [id number] History   First MD Initiated Contact with Patient 09/25/14 [phone]     Chief Complaint  Patient presents with  . Back Pain     (Consider location/radiation/quality/duration/timing/severity/associated sxs/prior Treatment) HPI Patient presents with diffuse lower back pain. Pain began several months ago, without clear precipitant.  Since onset has been persistent, with minimal relief with OTC medication.  Pain is sore, severe, with inconsistent radiation down the lower legs. No new abdominal pain, incontinence, urinary complaints.  Past Medical History  Diagnosis Date  . Anxiety   . Hypothyroidism   . Bipolar disorder   . History of indigestion    Past Surgical History  Procedure Laterality Date  . Joint replacement      bilat knee  . Carpel tunnel    . Tonsillectomy    . Total knee revision  05/01/2012    Procedure: TOTAL KNEE REVISION;  Surgeon: Raymon MuttonStephen D Lucey, MD;  [facility];  Service: Orthopedics;  Laterality: Right;   History reviewed. No pertinent family history. History  Substance Use Topics  . Smoking status: Former Games developermoker  . Smokeless tobacco: Not on file  . Alcohol Use: No   OB History    No data available     Review of Systems  All other systems reviewed and are negative.     Allergies  Review of patient's allergies indicates no known allergies.  Home Medications   Prior to Admission medications   Medication Sig Start Date End Date Taking? Authorizing Provider  benztropine (COGENTIN) 0.5 MG tablet Take 0.5 mg by mouth at bedtime.   Yes Historical Provider, MD  divalproex (DEPAKOTE ER) 250 MG 24 hr tablet Take 500 mg by mouth at bedtime.   Yes Historical Provider, MD  ibuprofen (ADVIL,MOTRIN) 200 MG tablet Take 200 mg by mouth every 6 (six) hours as needed for pain.   Yes Historical Provider, MD  levothyroxine (SYNTHROID, LEVOTHROID) 112 MCG tablet Take 112 mcg by mouth daily before breakfast.    Yes Historical Provider, MD  lithium carbonate 300 MG capsule Take 300 mg by mouth at bedtime.   Yes Historical Provider, MD  OLANZapine (ZYPREXA) 20 MG tablet Take 20 mg by mouth at bedtime.   Yes Historical Provider, MD  vitamin B-12 (CYANOCOBALAMIN) 1000 MCG tablet Take 1,000 mcg by mouth daily.   Yes Historical Provider, MD  Alum & Mag Hydroxide-Simeth (MAGIC MOUTHWASH) SOLN Take 2 mL by mouth 3 (three) times daily as needed. 07/04/13   Jennifer L Piepenbrink, PA-C  antipyrine-benzocaine (AURALGAN) otic solution Place 3 drops into both ears every 2 (two) hours as needed for pain.    Historical Provider, MD  chlorhexidine (PERIDEX) 0.12 % solution Use as directed 15 mL in the mouth or throat 2 (two) times daily.    Historical Provider, MD  clonazePAM (KLONOPIN) 0.5 MG tablet Take 0.5-1 mg by mouth 3 (three) times daily. Pt takes 1 tablet in the morning, 1 tablet at noon,  and  2 tabs at bedtime.    Historical Provider, MD  diphenhydrAMINE (BENADRYL) 25 mg capsule Take 25 mg by mouth every 6 (six) hours.     Historical Provider, MD  hydrocortisone (ANUSOL-HC) 2.5 % rectal cream Place 1 application rectally 2 (two) times daily.    Historical Provider, MD  hydrocortisone (ANUSOL-HC) 25 MG suppository Place 25 mg rectally 2 (two) times daily.    Historical Provider, MD  hydrOXYzine (ATARAX/VISTARIL) 25 MG tablet  Take 25 mg by mouth 3 (three) times daily as needed for itching.    Historical Provider, MD  levothyroxine (SYNTHROID, LEVOTHROID) 125 MCG tablet Take 125 mcg by mouth daily.    Historical Provider, MD  lithium carbonate 150 MG capsule Take 150 mg by mouth at bedtime.    Historical Provider, MD  metroNIDAZOLE (FLAGYL) 500 MG tablet Take 500 mg by mouth 3 (three) times daily.    Historical Provider, MD  Multiple Vitamin (MULTIVITAMIN WITH MINERALS) TABS tablet Take 1 tablet by mouth daily.    Historical Provider, MD  pantoprazole (PROTONIX) 40 MG tablet Take 40 mg by mouth daily.    Historical  Provider, MD  QUEtiapine (SEROQUEL) 50 MG tablet Take 150 mg by mouth at bedtime.    Historical Provider, MD  traZODone (DESYREL) 50 MG tablet Take 50 mg by mouth at bedtime as needed for sleep.     Historical Provider, MD  vitamin B-12 (CYANOCOBALAMIN) 1000 MCG tablet Take 1,000 mcg by mouth daily.    Historical Provider, MD  zolpidem (AMBIEN) 5 MG tablet Take 1 tablet (5 mg total) by mouth at bedtime as needed for sleep. 06/16/13   Lyanne CoKevin M Campos, MD   BP 113/63 mmHg  Pulse 98  Temp(Src) 98.2 F (36.8 C) (Oral)  SpO2 97% Physical Exam  Constitutional: She is oriented to person, place, and time. She appears well-developed and well-nourished. No distress.  HENT:  Head: Normocephalic and atraumatic.  Eyes: Conjunctivae and EOM are normal.  Cardiovascular: Regular rhythm.  Tachycardia present.   Pulmonary/Chest: Effort normal and breath sounds normal. No stridor. No respiratory distress.  Abdominal: She exhibits no distension. There is no tenderness.  Musculoskeletal: She exhibits no edema.       Arms: Neurological: She is alert and oriented to person, place, and time. She displays no atrophy and no tremor. No cranial nerve deficit. She exhibits normal muscle tone. She displays no seizure activity. Coordination and gait normal.  Strength is 5/5 in both LE (hips / knees)  Skin: Skin is warm and dry.  Psychiatric: She has a normal mood and affect.  Nursing note and vitals reviewed.   ED Course  Procedures (including critical care time) Labs Review Labs Reviewed  URINALYSIS, ROUTINE W REFLEX MICROSCOPIC - Abnormal; Notable for the following:    APPearance CLOUDY (*)    All other components within normal limits     MDM  Patient p/w months of low back pain.  No neuro complaints or abnormal findings.  No findings / complaints concerning for occult infection / malignancy. Patient has PMD F/U tomorrow and will discuss PT to go with her regimen of analgesia which was started  today.     Gerhard Munchobert Jesi Jurgens, MD 09/25/14 [phone]

## 2014-09-25 NOTE — ED Notes (Signed)
Pt states lower back pain radiating to right knee for past 2 months. Pain is keeping her up at night.  Pt is from home.  Knee replacements in rt knee x 2 in last 2-3 years.  Vitals: 150 90, hr 104, resp 18, 97% ra pain 8/10

## 2014-09-25 NOTE — Discharge Instructions (Signed)
As discussed your evaluation has been largely reassuring.  It is important that she take all medication as directed, and follow up with your physician tomorrow as scheduled.  Please be sure to discuss referral for physical therapy as well as appropriate ongoing medication for your pain.  Return here for concerning changes in your condition.

## 2014-09-27 ENCOUNTER — Other Ambulatory Visit: Payer: Self-pay | Admitting: Nurse Practitioner

## 2014-09-27 ENCOUNTER — Ambulatory Visit
Admission: RE | Admit: 2014-09-27 | Discharge: 2014-09-27 | Disposition: A | Discharge status: Home / Self Care | Payer: Commercial Managed Care - HMO | Source: Ambulatory Visit | Attending: Nurse Practitioner | Admitting: Nurse Practitioner

## 2014-09-27 DIAGNOSIS — M5432 Sciatica, left side: Secondary | ICD-10-CM

## 2014-10-02 ENCOUNTER — Encounter (HOSPITAL_COMMUNITY): Payer: Self-pay | Admitting: Emergency Medicine

## 2014-10-02 ENCOUNTER — Emergency Department (HOSPITAL_COMMUNITY)
Admission: EM | Admit: 2014-10-02 | Discharge: 2014-10-02 | Disposition: A | Discharge status: Home / Self Care | Payer: Medicare HMO | Attending: Emergency Medicine | Admitting: Emergency Medicine

## 2014-10-02 DIAGNOSIS — E039 Hypothyroidism, unspecified: Secondary | ICD-10-CM | POA: Diagnosis not present

## 2014-10-02 DIAGNOSIS — Z7952 Long term (current) use of systemic steroids: Secondary | ICD-10-CM | POA: Insufficient documentation

## 2014-10-02 DIAGNOSIS — F419 Anxiety disorder, unspecified: Secondary | ICD-10-CM | POA: Diagnosis not present

## 2014-10-02 DIAGNOSIS — Z79899 Other long term (current) drug therapy: Secondary | ICD-10-CM | POA: Insufficient documentation

## 2014-10-02 DIAGNOSIS — Z8719 Personal history of other diseases of the digestive system: Secondary | ICD-10-CM | POA: Diagnosis not present

## 2014-10-02 DIAGNOSIS — M79604 Pain in right leg: Secondary | ICD-10-CM | POA: Diagnosis not present

## 2014-10-02 DIAGNOSIS — M5432 Sciatica, left side: Secondary | ICD-10-CM | POA: Diagnosis not present

## 2014-10-02 DIAGNOSIS — R252 Cramp and spasm: Secondary | ICD-10-CM

## 2014-10-02 DIAGNOSIS — Z792 Long term (current) use of antibiotics: Secondary | ICD-10-CM | POA: Insufficient documentation

## 2014-10-02 DIAGNOSIS — M79605 Pain in left leg: Secondary | ICD-10-CM | POA: Diagnosis present

## 2014-10-02 DIAGNOSIS — M5136 Other intervertebral disc degeneration, lumbar region: Secondary | ICD-10-CM | POA: Diagnosis not present

## 2014-10-02 DIAGNOSIS — Z87891 Personal history of nicotine dependence: Secondary | ICD-10-CM | POA: Insufficient documentation

## 2014-10-02 DIAGNOSIS — F319 Bipolar disorder, unspecified: Secondary | ICD-10-CM | POA: Diagnosis not present

## 2014-10-02 LAB — I-STAT CHEM 8, ED
BUN: 9 mg/dL (ref 6–23)
CREATININE: 0.8 mg/dL (ref 0.50–1.10)
Calcium, Ion: 1.29 mmol/L (ref 1.13–1.30)
Chloride: 100 mEq/L (ref 96–112)
GLUCOSE: 94 mg/dL (ref 70–99)
HCT: 35 % — ABNORMAL LOW (ref 36.0–46.0)
HEMOGLOBIN: 11.9 g/dL — AB (ref 12.0–15.0)
Potassium: 3.8 mEq/L (ref 3.7–5.3)
Sodium: 139 mEq/L (ref 137–147)
TCO2: 27 mmol/L (ref 0–100)

## 2014-10-02 MED ORDER — HYDROCODONE-ACETAMINOPHEN 5-325 MG PO TABS
1.0000 | ORAL_TABLET | Freq: Once | ORAL | Status: AC
  Administered 2014-10-02: 1 via ORAL
  Filled 2014-10-02: qty 1

## 2014-10-02 MED ORDER — METHOCARBAMOL 750 MG PO TABS: 750.0000 mg | ORAL_TABLET | Freq: Four times a day (QID) | ORAL | Status: DC

## 2014-10-02 MED ORDER — METHOCARBAMOL 500 MG PO TABS
1000.0000 mg | ORAL_TABLET | Freq: Once | ORAL | Status: AC
  Administered 2014-10-02: 1000 mg via ORAL
  Filled 2014-10-02: qty 2

## 2014-10-02 MED ORDER — HYDROCODONE-ACETAMINOPHEN 5-325 MG PO TABS: 1.0000 | ORAL_TABLET | Freq: Four times a day (QID) | ORAL | Status: DC | PRN

## 2014-10-02 NOTE — ED Notes (Addendum)
Patient c/o bilateral leg swelling and pain, states she has the swelling normally but that tonight it's worse. Pain 4/10. Patient states she has been able to walk, but that it hurts.

## 2014-10-02 NOTE — ED Notes (Signed)
Bed: [id number] Expected date: 10/02/14 Expected time: 1:04 AM Means of arrival: Ambulance Comments: Bilateral leg cramping

## 2014-10-02 NOTE — Discharge Instructions (Signed)
Back Pain, Adult Back pain is very common. The pain often gets better over time. The cause of back pain is usually not dangerous. Most people can learn to manage their back pain on their own.  HOME CARE   Stay active. Start with short walks on flat ground if you can. Try to walk farther each day.  Do not sit, drive, or stand in one place for more than 30 minutes. Do not stay in bed.  Do not avoid exercise or work. Activity can help your back heal faster.  Be careful when you bend or lift an object. Bend at your knees, keep the object close to you, and do not twist.  Sleep on a firm mattress. Lie on your side, and bend your knees. If you lie on your back, put a pillow under your knees.  Only take medicines as told by your doctor.  Put ice on the injured area.  Put ice in a plastic bag.  Place a towel between your skin and the bag.  Leave the ice on for 15-20 minutes, 03-04 times a day for the first 2 to 3 days. After that, you can switch between ice and heat packs.  Ask your doctor about back exercises or massage.  Avoid feeling anxious or stressed. Find good ways to deal with stress, such as exercise. GET HELP RIGHT AWAY IF:   Your pain does not go away with rest or medicine.  Your pain does not go away in 1 week.  You have new problems.  You do not feel well.  The pain spreads into your legs.  You cannot control when you poop (bowel movement) or pee (urinate).  Your arms or legs feel weak or lose feeling (numbness).  You feel sick to your stomach (nauseous) or throw up (vomit).  You have belly (abdominal) pain.  You feel like you may pass out (faint). MAKE SURE YOU:   Understand these instructions.  Will watch your condition.  Will get help right away if you are not doing well or get worse. Document Released: 04/26/2008 Document Revised: 01/31/2012 Document Reviewed: 03/12/2014 [facility] Patient Information 2015 [place], [place]. This information is not intended  to replace advice given to you by your health care provider. Make sure you discuss any questions you have with your health care provider.  Degenerative Disk Disease Degenerative disk disease is a condition caused by the changes that occur in the cushions of the backbone (spinal disks) as you grow older. Spinal disks are soft and compressible disks located between the bones of the spine (vertebrae). They act like shock absorbers. Degenerative disk disease can affect the whole spine. However, the neck and lower back are most commonly affected. Many changes can occur in the spinal disks with aging, such as:  The spinal disks may dry and shrink.  Small tears may occur in the tough, outer covering of the disk (annulus).  The disk space may become smaller due to loss of water.  Abnormal growths in the bone (spurs) may occur. This can put pressure on the nerve roots exiting the spinal canal, causing pain.  The spinal canal may become narrowed. CAUSES  Degenerative disk disease is a condition caused by the changes that occur in the spinal disks with aging. The exact cause is not known, but there is a genetic basis for many patients. Degenerative changes can occur due to loss of fluid in the disk. This makes the disk thinner and reduces the space between the backbones. Small  cracks can develop in the outer layer of the disk. This can lead to the breakdown of the disk. You are more likely to get degenerative disk disease if you are overweight. Smoking cigarettes and doing heavy work such as weightlifting can also increase your risk of this condition. Degenerative changes can start after a sudden injury. Growth of bone spurs can compress the nerve roots and cause pain.  SYMPTOMS  The symptoms vary from person to person. Some people may have no pain, while others have severe pain. The pain may be so severe that it can limit your activities. The location of the pain depends on the part of your backbone that is  affected. You will have neck or arm pain if a disk in the neck area is affected. You will have pain in your back, buttocks, or legs if a disk in the lower back is affected. The pain becomes worse while bending, reaching up, or with twisting movements. The pain may start gradually and then get worse as time passes. It may also start after a major or minor injury. You may feel numbness or tingling in the arms or legs.  DIAGNOSIS  Your caregiver will ask you about your symptoms and about activities or habits that may cause the pain. He or she may also ask about any injuries, diseases, or treatments you have had earlier. Your caregiver will examine you to check for the range of movement that is possible in the affected area, to check for strength in your extremities, and to check for sensation in the areas of the arms and legs supplied by different nerve roots. An X-ray of the spine may be taken. Your caregiver may suggest other imaging tests, such as magnetic resonance imaging (MRI), if needed.  TREATMENT  Treatment includes rest, modifying your activities, and applying ice and heat. Your caregiver may prescribe medicines to reduce your pain and may ask you to do some exercises to strengthen your back. In some cases, you may need surgery. You and your caregiver will decide on the treatment that is best for you. HOME CARE INSTRUCTIONS   Follow proper lifting and walking techniques as advised by your caregiver.  Maintain good posture.  Exercise regularly as advised.  Perform relaxation exercises.  Change your sitting, standing, and sleeping habits as advised. Change positions frequently.  Lose weight as advised.  Stop smoking if you smoke.  Wear supportive footwear. SEEK MEDICAL CARE IF:  Your pain does not go away within 1 to 4 weeks. SEEK IMMEDIATE MEDICAL CARE IF:   Your pain is severe.  You notice weakness in your arms, hands, or legs.  You begin to lose control of your bladder or bowel  movements. MAKE SURE YOU:   Understand these instructions.  Will watch your condition.  Will get help right away if you are not doing well or get worse. Document Released: 09/05/2007 Document Revised: 01/31/2012 Document Reviewed: 03/12/2014 [facility] Patient Information 2015 [place], [place]. This information is not intended to replace advice given to you by your health care provider. Make sure you discuss any questions you have with your health care provider.  Leg Cramps Leg cramps that occur during exercise can be caused by poor circulation or dehydration. However, muscle cramps that occur at rest or during the night are usually not due to any serious medical problem. Heat cramps may cause muscle spasms during hot weather.  CAUSES There is no clear cause for muscle cramps. However, dehydration may be a factor for  those who do not drink enough fluids and those who exercise in the heat. Imbalances in the level of sodium, potassium, calcium or magnesium in the muscle tissue may also be a factor. Some medications, such as water pills (diuretics), may cause loss of chemicals that the body needs (like sodium and potassium) and cause muscle cramps. TREATMENT   Make sure your diet has enough fluids and essential minerals for the muscle to work normally.  Avoid strenuous exercise for several days if you have been having frequent leg cramps.  Stretch and massage the cramped muscle for several minutes.  Some medicines may be helpful in some patients with night cramps. Only take over-the-counter or prescription medicines as directed by your caregiver. SEEK IMMEDIATE MEDICAL CARE IF:   Your leg cramps become worse.  Your foot becomes cold, numb, or blue. Document Released: 12/16/2004 Document Revised: 01/31/2012 Document Reviewed: 12/03/2008 [facility] Patient Information 2015 [place], [place]. This information is not intended to replace advice given to you by your health care provider. Make sure you  discuss any questions you have with your health care provider.  Sciatica Sciatica is pain, weakness, numbness, or tingling along the path of the sciatic nerve. The nerve starts in the lower back and runs down the back of each leg. The nerve controls the muscles in the lower leg and in the back of the knee, while also providing sensation to the back of the thigh, lower leg, and the sole of your foot. Sciatica is a symptom of another medical condition. For instance, nerve damage or certain conditions, such as a herniated disk or bone spur on the spine, pinch or put pressure on the sciatic nerve. This causes the pain, weakness, or other sensations normally associated with sciatica. Generally, sciatica only affects one side of the body. CAUSES   Herniated or slipped disc.  Degenerative disk disease.  A pain disorder involving the narrow muscle in the buttocks (piriformis syndrome).  Pelvic injury or fracture.  Pregnancy.  Tumor (rare). SYMPTOMS  Symptoms can vary from mild to very severe. The symptoms usually travel from the low back to the buttocks and down the back of the leg. Symptoms can include:  Mild tingling or dull aches in the lower back, leg, or hip.  Numbness in the back of the calf or sole of the foot.  Burning sensations in the lower back, leg, or hip.  Sharp pains in the lower back, leg, or hip.  Leg weakness.  Severe back pain inhibiting movement. These symptoms may get worse with coughing, sneezing, laughing, or prolonged sitting or standing. Also, being overweight may worsen symptoms. DIAGNOSIS  Your caregiver will perform a physical exam to look for common symptoms of sciatica. He or she may ask you to do certain movements or activities that would trigger sciatic nerve pain. Other tests may be performed to find the cause of the sciatica. These may include:  Blood tests.  X-rays.  Imaging tests, such as an MRI or CT scan. TREATMENT  Treatment is directed at the  cause of the sciatic pain. Sometimes, treatment is not necessary and the pain and discomfort goes away on its own. If treatment is needed, your caregiver may suggest:  Over-the-counter medicines to relieve pain.  Prescription medicines, such as anti-inflammatory medicine, muscle relaxants, or narcotics.  Applying heat or ice to the painful area.  Steroid injections to lessen pain, irritation, and inflammation around the nerve.  Reducing activity during periods of pain.  Exercising and stretching to strengthen  your abdomen and improve flexibility of your spine. Your caregiver may suggest losing weight if the extra weight makes the back pain worse.  Physical therapy.  Surgery to eliminate what is pressing or pinching the nerve, such as a bone spur or part of a herniated disk. HOME CARE INSTRUCTIONS   Only take over-the-counter or prescription medicines for pain or discomfort as directed by your caregiver.  Apply ice to the affected area for 20 minutes, 3-4 times a day for the first 48-72 hours. Then try heat in the same way.  Exercise, stretch, or perform your usual activities if these do not aggravate your pain.  Attend physical therapy sessions as directed by your caregiver.  Keep all follow-up appointments as directed by your caregiver.  Do not wear high heels or shoes that do not provide proper support.  Check your mattress to see if it is too soft. A firm mattress may lessen your pain and discomfort. SEEK IMMEDIATE MEDICAL CARE IF:   You lose control of your bowel or bladder (incontinence).  You have increasing weakness in the lower back, pelvis, buttocks, or legs.  You have redness or swelling of your back.  You have a burning sensation when you urinate.  You have pain that gets worse when you lie down or awakens you at night.  Your pain is worse than you have experienced in the past.  Your pain is lasting longer than 4 weeks.  You are suddenly losing weight  without reason. MAKE SURE YOU:  Understand these instructions.  Will watch your condition.  Will get help right away if you are not doing well or get worse. Document Released: 11/02/2001 Document Revised: 05/09/2012 Document Reviewed: 03/19/2012 [facility] Patient Information 2015 [place], [place]. This information is not intended to replace advice given to you by your health care provider. Make sure you discuss any questions you have with your health care provider.

## 2014-10-02 NOTE — ED Provider Notes (Signed)
CSN: [id number]     Arrival date & time 10/02/14  0119 History   First MD Initiated Contact with Patient 10/02/14 0235     Chief Complaint  Patient presents with  . Leg Pain     (Consider location/radiation/quality/duration/timing/severity/associated sxs/prior Treatment) HPI 63 year old female presents to emergency department from home via EMS with complaint of bilateral leg cramping, left lower back pain radiating down her left leg.  Patient was seen for same and the fourth.  She reports that she saw her doctor on Friday.  She had an x-ray done that she does not know the results of yet.  Patient has been taken tramadol as prescribed, reports it only makes her sleepy and does not help with the pain.  She denies any bowel or bladder incontinence, no numbness.  No weakness.  Patient has history of bilateral knee surgeries, reports that her knees never got better. Past Medical History  Diagnosis Date  . Anxiety   . Hypothyroidism   . Bipolar disorder   . History of indigestion    Past Surgical History  Procedure Laterality Date  . Joint replacement      bilat knee  . Carpel tunnel    . Tonsillectomy    . Total knee revision  05/01/2012    Procedure: TOTAL KNEE REVISION;  Surgeon: Raymon MuttonStephen D Lucey, MD;  [facility];  Service: Orthopedics;  Laterality: Right;   No family history on file. History  Substance Use Topics  . Smoking status: Former Games developermoker  . Smokeless tobacco: Not on file  . Alcohol Use: No   OB History    No data available     Review of Systems   See History of Present Illness; otherwise all other systems are reviewed and negative  Allergies  Review of patient's allergies indicates no known allergies.  Home Medications   Prior to Admission medications   Medication Sig Start Date End Date Taking? Authorizing Provider  benztropine (COGENTIN) 0.5 MG tablet Take 0.5 mg by mouth at bedtime.   Yes Historical Provider, MD  divalproex (DEPAKOTE ER) 250 MG 24 hr  tablet Take 500 mg by mouth at bedtime.   Yes Historical Provider, MD  levothyroxine (SYNTHROID, LEVOTHROID) 112 MCG tablet Take 112 mcg by mouth daily before breakfast.   Yes Historical Provider, MD  lithium carbonate 300 MG capsule Take 300 mg by mouth at bedtime.   Yes Historical Provider, MD  Multiple Vitamin (MULTIVITAMIN WITH MINERALS) TABS tablet Take 1 tablet by mouth daily.   Yes Historical Provider, MD  naproxen sodium (ANAPROX) 220 MG tablet Take 440 mg by mouth 2 (two) times daily as needed (pain).   Yes Historical Provider, MD  OLANZapine (ZYPREXA) 20 MG tablet Take 20 mg by mouth at bedtime.   Yes Historical Provider, MD  traMADol (ULTRAM) 50 MG tablet Take 1 tablet (50 mg total) by mouth every 6 (six) hours as needed. Patient taking differently: Take 50-100 mg by mouth every 6 (six) hours as needed for moderate pain.  09/25/14  Yes Gerhard Munchobert Lockwood, MD  vitamin B-12 (CYANOCOBALAMIN) 1000 MCG tablet Take 1,000 mcg by mouth daily.   Yes Historical Provider, MD  Alum & Mag Hydroxide-Simeth (MAGIC MOUTHWASH) SOLN Take 2 mL by mouth 3 (three) times daily as needed. Patient not taking: Reported on 10/02/2014 07/04/13   Lise AuerJennifer L Piepenbrink, PA-C  antipyrine-benzocaine Lyla Son(AURALGAN) otic solution Place 3 drops into both ears every 2 (two) hours as needed for pain.    Historical Provider, MD  chlorhexidine (PERIDEX) 0.12 % solution Use as directed 15 mL in the mouth or throat 2 (two) times daily.    Historical Provider, MD  clonazePAM (KLONOPIN) 0.5 MG tablet Take 0.5-1 mg by mouth 3 (three) times daily. Pt takes 1 tablet in the morning, 1 tablet at noon,  and  2 tabs at bedtime.    Historical Provider, MD  diphenhydrAMINE (BENADRYL) 25 mg capsule Take 25 mg by mouth every 6 (six) hours.     Historical Provider, MD  hydrocortisone (ANUSOL-HC) 2.5 % rectal cream Place 1 application rectally 2 (two) times daily.    Historical Provider, MD  hydrocortisone (ANUSOL-HC) 25 MG suppository Place 25 mg  rectally 2 (two) times daily.    Historical Provider, MD  hydrOXYzine (ATARAX/VISTARIL) 25 MG tablet Take 25 mg by mouth 3 (three) times daily as needed for itching.    Historical Provider, MD  ibuprofen (ADVIL,MOTRIN) 200 MG tablet Take 200 mg by mouth every 6 (six) hours as needed for pain.    Historical Provider, MD  metroNIDAZOLE (FLAGYL) 500 MG tablet Take 500 mg by mouth 3 (three) times daily.    Historical Provider, MD  pantoprazole (PROTONIX) 40 MG tablet Take 40 mg by mouth daily.    Historical Provider, MD  QUEtiapine (SEROQUEL) 50 MG tablet Take 150 mg by mouth at bedtime.    Historical Provider, MD  traZODone (DESYREL) 50 MG tablet Take 50 mg by mouth at bedtime as needed for sleep.     Historical Provider, MD  zolpidem (AMBIEN) 5 MG tablet Take 1 tablet (5 mg total) by mouth at bedtime as needed for sleep. Patient not taking: Reported on 10/02/2014 06/16/13   Lyanne Co, MD   BP 120/73 mmHg  Pulse 100  Temp(Src) 97.6 F (36.4 C) (Oral)  Resp 18  SpO2 95% Physical Exam  Constitutional: She is oriented to person, place, and time. She appears well-developed and well-nourished. She appears distressed.  HENT:  Head: Normocephalic and atraumatic.  Nose: Nose normal.  Mouth/Throat: Oropharynx is clear and moist.  Eyes: Conjunctivae and EOM are normal. Pupils are equal, round, and reactive to light.  Neck: Normal range of motion. Neck supple. No JVD present. No tracheal deviation present. No thyromegaly present.  Cardiovascular: Normal rate, regular rhythm, normal heart sounds and intact distal pulses.  Exam reveals no gallop and no friction rub.   No murmur heard. Pulmonary/Chest: Effort normal and breath sounds normal. No stridor. No respiratory distress. She has no wheezes. She has no rales. She exhibits no tenderness.  Abdominal: Soft. Bowel sounds are normal. She exhibits no distension and no mass. There is no tenderness. There is no rebound and no guarding.  Musculoskeletal:  Normal range of motion. She exhibits edema and tenderness.  Patient has pain with palpation of left SI joint and with movement of the right leg from toes to hip.  She has positive straight leg raise.  Trace edema nonpitting.  Sensation and range of motion is intact in all extremities  Lymphadenopathy:    She has no cervical adenopathy.  Neurological: She is alert and oriented to person, place, and time. She displays normal reflexes. She exhibits normal muscle tone. Coordination normal.  Skin: Skin is warm and dry. No rash noted. No erythema. No pallor.  Psychiatric: She has a normal mood and affect. Her behavior is normal. Judgment and thought content normal.  Nursing note and vitals reviewed.   ED Course  Procedures (including critical care time) Labs Review Labs  Reviewed - No data to display  Imaging Review No results found.  Results for orders placed or performed during the hospital encounter of 10/02/14  I-Stat Chem 8, ED  Result Value Ref Range   Sodium 139 137 - 147 mEq/L   Potassium 3.8 3.7 - 5.3 mEq/L   Chloride 100 96 - 112 mEq/L   BUN 9 6 - 23 mg/dL   Creatinine, Ser 8.290.80 0.50 - 1.10 mg/dL   Glucose, Bld 94 70 - 99 mg/dL   Calcium, Ion 5.621.29 1.301.13 - 1.30 mmol/L   TCO2 27 0 - 100 mmol/L   Hemoglobin 11.9 (L) 12.0 - 15.0 g/dL   HCT 86.535.0 (L) 78.436.0 - 69.646.0 %   Dg Lumbar Spine Complete  09/27/2014   CLINICAL DATA:  Severe left-sided low back pain for several months. Radiating down bilateral legs. No known injury. Initial encounter.  EXAM: LUMBAR SPINE - COMPLETE 4+ VIEW  COMPARISON:  CT of the abdomen and pelvis 01/04/2013  FINDINGS: Degenerative facet disease throughout the lumbar spine. Degenerative disc disease changes at L2-3, L3-4 and L5-S1 with disc space narrowing and early spurring. Slight anterolisthesis of L3 on L4 related to facet disease. No fracture. SI joints are symmetric and unremarkable.  IMPRESSION: Degenerative disc and facet disease.  No acute findings.    Electronically Signed   By: Charlett NoseKevin  Dover M.D.   On: 09/27/2014 11:31       EKG Interpretation None      MDM   Final diagnoses:  DDD (degenerative disc disease), lumbar  Bilateral leg cramps  Sciatica, left    63 year old female with ongoing left lower back pain with radicular symptoms and bilateral leg pain.  She is complaining of cramping, potassium was normal.  Recent lumbar film reviewed, degenerative disc disease noted.  Will add on Robaxin, discontinue tramadol and Ativan Vicodin until she is able to follow back up with her primary care doctor and/or her orthopedist.    Olivia Mackielga M Schawn Byas, MD 10/02/14 [phone]

## 2014-10-02 NOTE — ED Notes (Signed)
Patient presents from home via EMS for bilateral leg pain, described as "cramping" x "a few months", increasing pain x4 days. Per EMS mild edema in legs with present pedal pulses. Per EMS patient c/o left hip pain during transport, lung sounds clear bilaterally.  VS 160/100BP, 96HR, 16 Resp.

## 2014-10-08 ENCOUNTER — Other Ambulatory Visit (HOSPITAL_COMMUNITY): Payer: Self-pay | Admitting: Orthopedic Surgery

## 2014-10-08 ENCOUNTER — Ambulatory Visit (HOSPITAL_COMMUNITY)
Admission: RE | Admit: 2014-10-08 | Discharge: 2014-10-08 | Disposition: A | Discharge status: Home / Self Care | Payer: Medicare HMO | Source: Ambulatory Visit | Attending: Cardiovascular Disease | Admitting: Cardiovascular Disease

## 2014-10-08 DIAGNOSIS — M79605 Pain in left leg: Secondary | ICD-10-CM

## 2014-10-08 DIAGNOSIS — M7989 Other specified soft tissue disorders: Secondary | ICD-10-CM | POA: Diagnosis not present

## 2014-10-08 NOTE — Progress Notes (Signed)
Left Lower Extremity Venous Duplex Completed. No evidence for DVT or SVT. °Brianna L Mazza,RVT °

## 2014-10-10 ENCOUNTER — Encounter (HOSPITAL_COMMUNITY): Payer: Self-pay | Admitting: Emergency Medicine

## 2014-10-10 ENCOUNTER — Emergency Department (HOSPITAL_COMMUNITY)
Admission: EM | Admit: 2014-10-10 | Discharge: 2014-10-11 | Disposition: A | Discharge status: Home / Self Care | Payer: Commercial Managed Care - HMO | Attending: Emergency Medicine | Admitting: Emergency Medicine

## 2014-10-10 ENCOUNTER — Telehealth (HOSPITAL_COMMUNITY): Payer: Self-pay | Admitting: *Deleted

## 2014-10-10 ENCOUNTER — Emergency Department (HOSPITAL_COMMUNITY): Payer: Commercial Managed Care - HMO

## 2014-10-10 DIAGNOSIS — M7989 Other specified soft tissue disorders: Secondary | ICD-10-CM | POA: Diagnosis present

## 2014-10-10 DIAGNOSIS — M79662 Pain in left lower leg: Secondary | ICD-10-CM | POA: Diagnosis not present

## 2014-10-10 DIAGNOSIS — M79661 Pain in right lower leg: Secondary | ICD-10-CM | POA: Diagnosis not present

## 2014-10-10 DIAGNOSIS — M79605 Pain in left leg: Secondary | ICD-10-CM

## 2014-10-10 DIAGNOSIS — Z87891 Personal history of nicotine dependence: Secondary | ICD-10-CM | POA: Insufficient documentation

## 2014-10-10 DIAGNOSIS — E039 Hypothyroidism, unspecified: Secondary | ICD-10-CM | POA: Diagnosis not present

## 2014-10-10 DIAGNOSIS — G8929 Other chronic pain: Secondary | ICD-10-CM | POA: Diagnosis not present

## 2014-10-10 DIAGNOSIS — M549 Dorsalgia, unspecified: Secondary | ICD-10-CM | POA: Insufficient documentation

## 2014-10-10 DIAGNOSIS — Z79899 Other long term (current) drug therapy: Secondary | ICD-10-CM | POA: Diagnosis not present

## 2014-10-10 DIAGNOSIS — F319 Bipolar disorder, unspecified: Secondary | ICD-10-CM | POA: Diagnosis not present

## 2014-10-10 DIAGNOSIS — M79604 Pain in right leg: Secondary | ICD-10-CM

## 2014-10-10 HISTORY — DX: Dorsalgia, unspecified: M54.9

## 2014-10-10 HISTORY — DX: Other chronic pain: G89.29

## 2014-10-10 LAB — CBC WITH DIFFERENTIAL/PLATELET
BASOS PCT: 0 % (ref 0–1)
Basophils Absolute: 0 10*3/uL (ref 0.0–0.1)
EOS ABS: 0.1 10*3/uL (ref 0.0–0.7)
Eosinophils Relative: 2 % (ref 0–5)
HCT: 33.7 % — ABNORMAL LOW (ref 36.0–46.0)
HEMOGLOBIN: 10.9 g/dL — AB (ref 12.0–15.0)
Lymphocytes Relative: 29 % (ref 12–46)
Lymphs Abs: 1.2 10*3/uL (ref 0.7–4.0)
MCH: 26.3 pg (ref 26.0–34.0)
MCHC: 32.3 g/dL (ref 30.0–36.0)
MCV: 81.2 fL (ref 78.0–100.0)
MONOS PCT: 12 % (ref 3–12)
Monocytes Absolute: 0.5 10*3/uL (ref 0.1–1.0)
NEUTROS PCT: 57 % (ref 43–77)
Neutro Abs: 2.4 10*3/uL (ref 1.7–7.7)
PLATELETS: 241 10*3/uL (ref 150–400)
RBC: 4.15 MIL/uL (ref 3.87–5.11)
RDW: 14.3 % (ref 11.5–15.5)
WBC: 4.1 10*3/uL (ref 4.0–10.5)

## 2014-10-10 LAB — URINALYSIS, ROUTINE W REFLEX MICROSCOPIC
Bilirubin Urine: NEGATIVE
Glucose, UA: NEGATIVE mg/dL
HGB URINE DIPSTICK: NEGATIVE
Ketones, ur: NEGATIVE mg/dL
Leukocytes, UA: NEGATIVE
Nitrite: NEGATIVE
PROTEIN: NEGATIVE mg/dL
Specific Gravity, Urine: 1.006 (ref 1.005–1.030)
UROBILINOGEN UA: 0.2 mg/dL (ref 0.0–1.0)
pH: 5.5 (ref 5.0–8.0)

## 2014-10-10 LAB — BASIC METABOLIC PANEL
Anion gap: 10 (ref 5–15)
BUN: 15 mg/dL (ref 6–23)
CALCIUM: 10 mg/dL (ref 8.4–10.5)
CO2: 26 mEq/L (ref 19–32)
CREATININE: 0.83 mg/dL (ref 0.50–1.10)
Chloride: 100 mEq/L (ref 96–112)
GFR, EST AFRICAN AMERICAN: 85 mL/min — AB (ref >90)
GFR, EST NON AFRICAN AMERICAN: 73 mL/min — AB (ref >90)
Glucose, Bld: 89 mg/dL (ref 70–99)
Potassium: 4.2 mEq/L (ref 3.7–5.3)
Sodium: 136 mEq/L — ABNORMAL LOW (ref 137–147)

## 2014-10-10 MED ORDER — OXYCODONE-ACETAMINOPHEN 5-325 MG PO TABS
1.0000 | ORAL_TABLET | Freq: Once | ORAL | Status: AC
  Administered 2014-10-10: 1 via ORAL
  Filled 2014-10-10: qty 1

## 2014-10-10 MED ORDER — FUROSEMIDE 40 MG PO TABS
20.0000 mg | ORAL_TABLET | Freq: Once | ORAL | Status: AC
  Administered 2014-10-10: 20 mg via ORAL
  Filled 2014-10-10: qty 1

## 2014-10-10 MED ORDER — FUROSEMIDE 20 MG PO TABS: 20.0000 mg | ORAL_TABLET | Freq: Once | ORAL | Status: DC

## 2014-10-10 NOTE — ED Notes (Signed)
Pt is here for chronic back pain with sciatica, lower extremity swelling, and leg pain  Pt has had some medication adjustments recently  Pt was seen by her ortho dr and ruled out a DVT  Pt has been having problems for the past two weeks and was seen here earlier this week, the end of last week

## 2014-10-10 NOTE — ED Notes (Signed)
MD at bedside. 

## 2014-10-10 NOTE — ED Notes (Signed)
Family member contact number [phone]. Requesting update on pt discharge or admission.

## 2014-10-10 NOTE — ED Provider Notes (Signed)
CSN: [id number]     Arrival date & time 10/10/14  2002 History   First MD Initiated Contact with Patient 10/10/14 2118     Chief Complaint  Patient presents with  . Back Pain  . Leg Swelling     (Consider location/radiation/quality/duration/timing/severity/associated sxs/prior Treatment) HPI Brenda Murray is a 63 y.o. female with a history of anxiety, hypothyroid, degenerative disc disease with sciatica, chronic bilateral lower leg pain comes in for evaluation of back pain and leg swelling. [facility] and ruled out for left leg DVT. Patient presents today with same complaint that her lower shins, ankles and feet are red, swollen and very painful. She reports having walked this morning from 6:30 AM to approximately 9 AM and reports herself as being fairly active. She denies any fevers, shortness of breath, chest pain, numbness or weakness.  She reports back pain is chronic and is unchanged and just wants to see if we can tell her anything different than her orthopedist.  Past Medical History  Diagnosis Date  . Anxiety   . Hypothyroidism   . Bipolar disorder   . History of indigestion   . Chronic back pain    Past Surgical History  Procedure Laterality Date  . Joint replacement      bilat knee  . Carpel tunnel    . Tonsillectomy    . Total knee revision  05/01/2012    Procedure: TOTAL KNEE REVISION;  Surgeon: Raymon MuttonStephen D Lucey, MD;  [facility];  Service: Orthopedics;  Laterality: Right;   History reviewed. No pertinent family history. History  Substance Use Topics  . Smoking status: Former Games developermoker  . Smokeless tobacco: Not on file  . Alcohol Use: No   OB History    No data available     Review of Systems  Constitutional: Negative for fever.  HENT: Negative for sore throat.   Eyes: Negative for visual disturbance.  Respiratory: Negative for shortness of breath.   Cardiovascular: Negative for chest pain.  Gastrointestinal:  Negative for abdominal pain.  Endocrine: Negative for polyuria.  Genitourinary: Negative for dysuria.  Musculoskeletal: Positive for myalgias and back pain.  Skin: Positive for color change. Negative for rash.  Neurological: Negative for headaches.      Allergies  Review of patient's allergies indicates no known allergies.  Home Medications   Prior to Admission medications   Medication Sig Start Date End Date Taking? Authorizing Provider  benztropine (COGENTIN) 0.5 MG tablet Take 0.5 mg by mouth at bedtime.   Yes Historical Provider, MD  divalproex (DEPAKOTE ER) 250 MG 24 hr tablet Take 500 mg by mouth at bedtime.   Yes Historical Provider, MD  HYDROcodone-acetaminophen (NORCO/VICODIN) 5-325 MG per tablet Take 1-2 tablets by mouth every 6 (six) hours as needed for moderate pain. 10/02/14  Yes Olivia Mackielga M Otter, MD  ibuprofen (ADVIL,MOTRIN) 200 MG tablet Take 400 mg by mouth every 6 (six) hours as needed for headache (headache).   Yes Historical Provider, MD  levothyroxine (SYNTHROID, LEVOTHROID) 112 MCG tablet Take 112 mcg by mouth daily before breakfast.   Yes Historical Provider, MD  lithium carbonate 300 MG capsule Take 300 mg by mouth at bedtime.   Yes Historical Provider, MD  methocarbamol (ROBAXIN-750) 750 MG tablet Take 1 tablet (750 mg total) by mouth 4 (four) times daily. 10/02/14  Yes Olivia Mackielga M Otter, MD  Multiple Vitamin (MULTIVITAMIN WITH MINERALS) TABS tablet Take 1 tablet by mouth daily.  Yes Historical Provider, MD  Omega-3 Fatty Acids (OMEGA-3 FISH OIL PO) Take 1,200 mg by mouth daily.   Yes Historical Provider, MD  vitamin B-12 (CYANOCOBALAMIN) 1000 MCG tablet Take 1,000 mcg by mouth daily.   Yes Historical Provider, MD  furosemide (LASIX) 20 MG tablet Take 1 tablet (20 mg total) by mouth once. 10/10/14   Earle GellBenjamin W Fredric Slabach, PA-C  OLANZapine (ZYPREXA) 10 MG tablet Take 10 mg by mouth at bedtime.    Historical Provider, MD   BP 120/72 mmHg  Pulse 78  Temp(Src) 97.8 F (36.6  C) (Oral)  Resp 16  SpO2 100% Physical Exam  Constitutional: She is oriented to person, place, and time. She appears well-developed and well-nourished.  HENT:  Head: Normocephalic and atraumatic.  Mouth/Throat: Oropharynx is clear and moist.  Eyes: Conjunctivae are normal. Pupils are equal, round, and reactive to light. Right eye exhibits no discharge. Left eye exhibits no discharge. No scleral icterus.  Neck: Normal range of motion. Neck supple. No JVD present. No tracheal deviation present.  Cardiovascular: Normal rate, regular rhythm, normal heart sounds and intact distal pulses.  Exam reveals no gallop and no friction rub.   No murmur heard. Pulmonary/Chest: Effort normal and breath sounds normal. No respiratory distress. She has no wheezes. She has no rales.  Abdominal: Soft. There is no tenderness.  Musculoskeletal: Normal range of motion.  Bilateral lower extremities below the shin appear edematous with mild erythema. Compartments soft. Cap refill less than 2 seconds. No obvious lesions or ulcerations noted. Full active ROM. DP intact and equal. Diffuse tenderness. Compartments soft.  Neurological: She is alert and oriented to person, place, and time.  Cranial Nerves II-XII grossly intact  Skin: Skin is warm and dry. No rash noted.  Psychiatric: She has a normal mood and affect.  Nursing note and vitals reviewed.   ED Course  Procedures (including critical care time) Labs Review Labs Reviewed  BASIC METABOLIC PANEL - Abnormal; Notable for the following:    Sodium 136 (*)    GFR calc non Af Amer 73 (*)    GFR calc Af Amer 85 (*)    All other components within normal limits  CBC WITH DIFFERENTIAL - Abnormal; Notable for the following:    Hemoglobin 10.9 (*)    HCT 33.7 (*)    All other components within normal limits  URINALYSIS, ROUTINE W REFLEX MICROSCOPIC    Imaging Review Dg Chest 2 View  10/10/2014   CLINICAL DATA:  Extremity swelling for 2 weeks. Shortness of  breath. Back pain.  EXAM: CHEST  2 VIEW  COMPARISON:  04/26/2012  FINDINGS: Metallic clip projected over the heart. Normal heart size and pulmonary vascularity. Slightly shallow inspiration. No focal airspace disease or consolidation in the lungs. Persistent patchy infiltration vaguely demonstrated in the right mid lung and left lung base, similar to prior study. This is likely fibrosis. No blunting of costophrenic angles. No pneumothorax.  IMPRESSION: No active cardiopulmonary disease.   Electronically Signed   By: Burman NievesWilliam  Stevens M.D.   On: 10/10/2014 22:34     EKG Interpretation None     Meds given in ED:  Medications  oxyCODONE-acetaminophen (PERCOCET/ROXICET) 5-325 MG per tablet 1 tablet (1 tablet Oral Given 10/10/14 2211)  furosemide (LASIX) tablet 20 mg (20 mg Oral Given 10/10/14 2307)    Discharge Medication List as of 10/10/2014 11:45 PM    START taking these medications   Details  furosemide (LASIX) 20 MG tablet Take 1 tablet (20  mg total) by mouth once., Starting 10/10/2014, Print       Filed Vitals:   10/10/14 2021 10/10/14 2249 10/10/14 2358  BP: 125/83 114/71 120/72  Pulse: 82 61 78  Temp: 97.8 F (36.6 C)    TempSrc: Oral    Resp: 18 18 16   SpO2: 97% 99% 100%    MDM  Vitals stable - WNL -afebrile 100% sat Pt resting comfortably in ED. PT states she feels better since being in ED. Ruled out for DVT on 11/17 PE not concerning for other acute or emergent pathology. No evidence of infection, compartment syndrome, emergent vascular compromise. Denies cp, sob, cough, hemoptysis. Labwork noncontributory Imaging--CXR shows no sign of fluid overload, EKG not concerning   Back pain is chronic and unchanged from previous.Recommended f/u with Ortho/PCP  Will DC with 20mg  Lasix qd until PCP appt on Monday. Discussed f/u with PCP and return precautions, pt very amenable to plan. Pt stable, in good condition and is appropriate for dischrge  Prior to patient discharge,  I discussed and reviewed this case with Dr.Harrison    Final diagnoses:  Leg pain, bilateral        Sharlene Motts, PA-C 10/11/14 1304  Purvis Sheffield, MD 10/11/14 2119

## 2014-10-10 NOTE — Discharge Instructions (Signed)
Your evaluated in the ED today for your lower leg pain. There is no evidence of emergent cause of your leg pain. Please follow-up with your primary care for your regularly scheduled appointment on Monday at 12 PM. Please return to ED if your symptoms worsen, you experience fevers, chest pain, shortness of breath, numbness or weakness. Please take the Lasix you were prescribed, this will help with the fluid on her legs

## 2014-11-04 ENCOUNTER — Encounter (HOSPITAL_COMMUNITY): Payer: Self-pay | Admitting: Emergency Medicine

## 2014-11-04 ENCOUNTER — Observation Stay (HOSPITAL_COMMUNITY)
Admission: EM | Admit: 2014-11-04 | Discharge: 2014-11-07 | Disposition: A | Discharge status: Home / Self Care | Payer: Commercial Managed Care - HMO | Attending: Internal Medicine | Admitting: Internal Medicine

## 2014-11-04 ENCOUNTER — Emergency Department (HOSPITAL_COMMUNITY): Payer: Commercial Managed Care - HMO

## 2014-11-04 DIAGNOSIS — E876 Hypokalemia: Secondary | ICD-10-CM | POA: Diagnosis present

## 2014-11-04 DIAGNOSIS — R1310 Dysphagia, unspecified: Secondary | ICD-10-CM | POA: Diagnosis not present

## 2014-11-04 DIAGNOSIS — K219 Gastro-esophageal reflux disease without esophagitis: Secondary | ICD-10-CM | POA: Insufficient documentation

## 2014-11-04 DIAGNOSIS — G8929 Other chronic pain: Secondary | ICD-10-CM | POA: Insufficient documentation

## 2014-11-04 DIAGNOSIS — M549 Dorsalgia, unspecified: Secondary | ICD-10-CM | POA: Diagnosis not present

## 2014-11-04 DIAGNOSIS — F319 Bipolar disorder, unspecified: Secondary | ICD-10-CM | POA: Insufficient documentation

## 2014-11-04 DIAGNOSIS — E871 Hypo-osmolality and hyponatremia: Secondary | ICD-10-CM | POA: Diagnosis not present

## 2014-11-04 DIAGNOSIS — Z87891 Personal history of nicotine dependence: Secondary | ICD-10-CM | POA: Diagnosis not present

## 2014-11-04 DIAGNOSIS — F419 Anxiety disorder, unspecified: Secondary | ICD-10-CM | POA: Diagnosis not present

## 2014-11-04 DIAGNOSIS — R1084 Generalized abdominal pain: Secondary | ICD-10-CM

## 2014-11-04 DIAGNOSIS — R112 Nausea with vomiting, unspecified: Secondary | ICD-10-CM | POA: Diagnosis present

## 2014-11-04 DIAGNOSIS — E039 Hypothyroidism, unspecified: Secondary | ICD-10-CM | POA: Diagnosis not present

## 2014-11-04 DIAGNOSIS — R1110 Vomiting, unspecified: Secondary | ICD-10-CM

## 2014-11-04 DIAGNOSIS — Z96653 Presence of artificial knee joint, bilateral: Secondary | ICD-10-CM | POA: Insufficient documentation

## 2014-11-04 DIAGNOSIS — E873 Alkalosis: Secondary | ICD-10-CM | POA: Diagnosis not present

## 2014-11-04 LAB — CBC WITH DIFFERENTIAL/PLATELET
Basophils Absolute: 0 10*3/uL (ref 0.0–0.1)
Basophils Relative: 0 % (ref 0–1)
Eosinophils Absolute: 0 10*3/uL (ref 0.0–0.7)
Eosinophils Relative: 1 % (ref 0–5)
HCT: 35.5 % — ABNORMAL LOW (ref 36.0–46.0)
Hemoglobin: 11.8 g/dL — ABNORMAL LOW (ref 12.0–15.0)
LYMPHS ABS: 1.3 10*3/uL (ref 0.7–4.0)
LYMPHS PCT: 25 % (ref 12–46)
MCH: 27 pg (ref 26.0–34.0)
MCHC: 33.2 g/dL (ref 30.0–36.0)
MCV: 81.2 fL (ref 78.0–100.0)
Monocytes Absolute: 0.7 10*3/uL (ref 0.1–1.0)
Monocytes Relative: 13 % — ABNORMAL HIGH (ref 3–12)
NEUTROS PCT: 61 % (ref 43–77)
Neutro Abs: 3.3 10*3/uL (ref 1.7–7.7)
PLATELETS: 289 10*3/uL (ref 150–400)
RBC: 4.37 MIL/uL (ref 3.87–5.11)
RDW: 13 % (ref 11.5–15.5)
WBC: 5.4 10*3/uL (ref 4.0–10.5)

## 2014-11-04 LAB — URINALYSIS, ROUTINE W REFLEX MICROSCOPIC
BILIRUBIN URINE: NEGATIVE
Glucose, UA: NEGATIVE mg/dL
Hgb urine dipstick: NEGATIVE
KETONES UR: NEGATIVE mg/dL
Leukocytes, UA: NEGATIVE
Nitrite: NEGATIVE
Protein, ur: NEGATIVE mg/dL
Specific Gravity, Urine: 1.04 — ABNORMAL HIGH (ref 1.005–1.030)
Urobilinogen, UA: 0.2 mg/dL (ref 0.0–1.0)
pH: 6 (ref 5.0–8.0)

## 2014-11-04 LAB — COMPREHENSIVE METABOLIC PANEL
ALT: 22 U/L (ref 0–35)
AST: 32 U/L (ref 0–37)
Albumin: 4.1 g/dL (ref 3.5–5.2)
Alkaline Phosphatase: 69 U/L (ref 39–117)
Anion gap: 13 (ref 5–15)
BILIRUBIN TOTAL: 0.4 mg/dL (ref 0.3–1.2)
BUN: 18 mg/dL (ref 6–23)
CHLORIDE: 90 meq/L — AB (ref 96–112)
CO2: 33 meq/L — AB (ref 19–32)
Calcium: 10.2 mg/dL (ref 8.4–10.5)
Creatinine, Ser: 0.84 mg/dL (ref 0.50–1.10)
GFR, EST AFRICAN AMERICAN: 84 mL/min — AB (ref >90)
GFR, EST NON AFRICAN AMERICAN: 72 mL/min — AB (ref >90)
GLUCOSE: 91 mg/dL (ref 70–99)
POTASSIUM: 2.8 meq/L — AB (ref 3.7–5.3)
SODIUM: 136 meq/L — AB (ref 137–147)
Total Protein: 9.1 g/dL — ABNORMAL HIGH (ref 6.0–8.3)

## 2014-11-04 LAB — I-STAT TROPONIN, ED: Troponin i, poc: 0 ng/mL (ref 0.00–0.08)

## 2014-11-04 MED ORDER — VITAMIN B-1 100 MG PO TABS
100.0000 mg | ORAL_TABLET | Freq: Every day | ORAL | Status: DC
  Administered 2014-11-05 – 2014-11-07 (×3): 100 mg via ORAL
  Filled 2014-11-04 (×3): qty 1

## 2014-11-04 MED ORDER — ACETAMINOPHEN 325 MG PO TABS
650.0000 mg | ORAL_TABLET | Freq: Four times a day (QID) | ORAL | Status: DC | PRN
  Administered 2014-11-06 – 2014-11-07 (×2): 650 mg via ORAL
  Filled 2014-11-04 (×2): qty 2

## 2014-11-04 MED ORDER — FOLIC ACID 1 MG PO TABS
1.0000 mg | ORAL_TABLET | Freq: Every day | ORAL | Status: DC
  Administered 2014-11-05 – 2014-11-07 (×3): 1 mg via ORAL
  Filled 2014-11-04 (×3): qty 1

## 2014-11-04 MED ORDER — POTASSIUM CHLORIDE CRYS ER 20 MEQ PO TBCR
40.0000 meq | EXTENDED_RELEASE_TABLET | Freq: Once | ORAL | Status: AC
  Administered 2014-11-04: 40 meq via ORAL
  Filled 2014-11-04: qty 2

## 2014-11-04 MED ORDER — ASPIRIN EC 81 MG PO TBEC
81.0000 mg | DELAYED_RELEASE_TABLET | Freq: Every day | ORAL | Status: DC
  Administered 2014-11-05 – 2014-11-07 (×3): 81 mg via ORAL
  Filled 2014-11-04 (×3): qty 1

## 2014-11-04 MED ORDER — HEPARIN SODIUM (PORCINE) 5000 UNIT/ML IJ SOLN
5000.0000 [IU] | Freq: Three times a day (TID) | INTRAMUSCULAR | Status: DC
  Administered 2014-11-05 – 2014-11-07 (×4): 5000 [IU] via SUBCUTANEOUS
  Filled 2014-11-04 (×10): qty 1

## 2014-11-04 MED ORDER — SODIUM CHLORIDE 0.9 % IV BOLUS (SEPSIS)
1000.0000 mL | INTRAVENOUS | Status: AC
  Administered 2014-11-04: 1000 mL via INTRAVENOUS

## 2014-11-04 MED ORDER — ADULT MULTIVITAMIN W/MINERALS CH
1.0000 | ORAL_TABLET | Freq: Every day | ORAL | Status: DC
  Administered 2014-11-05 – 2014-11-07 (×3): 1 via ORAL
  Filled 2014-11-04 (×3): qty 1

## 2014-11-04 MED ORDER — ARIPIPRAZOLE 10 MG PO TABS
10.0000 mg | ORAL_TABLET | Freq: Every day | ORAL | Status: DC
  Administered 2014-11-05 – 2014-11-06 (×2): 10 mg via ORAL
  Filled 2014-11-04 (×5): qty 1

## 2014-11-04 MED ORDER — POTASSIUM CHLORIDE CRYS ER 20 MEQ PO TBCR
20.0000 meq | EXTENDED_RELEASE_TABLET | Freq: Two times a day (BID) | ORAL | Status: DC
  Administered 2014-11-05: 20 meq via ORAL
  Filled 2014-11-04 (×3): qty 1

## 2014-11-04 MED ORDER — DIVALPROEX SODIUM ER 250 MG PO TB24
250.0000 mg | ORAL_TABLET | Freq: Every day | ORAL | Status: DC
  Administered 2014-11-05 – 2014-11-06 (×2): 250 mg via ORAL
  Filled 2014-11-04 (×4): qty 1

## 2014-11-04 MED ORDER — POTASSIUM CHLORIDE 10 MEQ/100ML IV SOLN
10.0000 meq | Freq: Once | INTRAVENOUS | Status: AC
  Administered 2014-11-04: 10 meq via INTRAVENOUS
  Filled 2014-11-04: qty 100

## 2014-11-04 MED ORDER — SODIUM CHLORIDE 0.9 % IJ SOLN
3.0000 mL | Freq: Two times a day (BID) | INTRAMUSCULAR | Status: DC
  Administered 2014-11-05 (×2): 3 mL via INTRAVENOUS

## 2014-11-04 MED ORDER — LEVOTHYROXINE SODIUM 112 MCG PO TABS
112.0000 ug | ORAL_TABLET | Freq: Every day | ORAL | Status: DC
  Administered 2014-11-05 – 2014-11-06 (×2): 112 ug via ORAL
  Filled 2014-11-04 (×4): qty 1

## 2014-11-04 MED ORDER — ONDANSETRON HCL 4 MG/2ML IJ SOLN: 4.0000 mg | Freq: Four times a day (QID) | INTRAMUSCULAR | Status: DC | PRN

## 2014-11-04 MED ORDER — ONDANSETRON HCL 4 MG/2ML IJ SOLN
4.0000 mg | Freq: Once | INTRAMUSCULAR | Status: AC
Start: 2014-11-04 — End: 2014-11-04
  Administered 2014-11-04: 4 mg via INTRAVENOUS
  Filled 2014-11-04: qty 2

## 2014-11-04 MED ORDER — ACETAMINOPHEN 650 MG RE SUPP: 650.0000 mg | Freq: Four times a day (QID) | RECTAL | Status: DC | PRN

## 2014-11-04 MED ORDER — SODIUM CHLORIDE 0.9 % IV SOLN
INTRAVENOUS | Status: DC
  Administered 2014-11-05: 01:00:00 via INTRAVENOUS

## 2014-11-04 MED ORDER — LITHIUM CARBONATE 300 MG PO CAPS
300.0000 mg | ORAL_CAPSULE | Freq: Every day | ORAL | Status: DC
  Administered 2014-11-05 – 2014-11-06 (×2): 300 mg via ORAL
  Filled 2014-11-04 (×5): qty 1

## 2014-11-04 MED ORDER — IOHEXOL 300 MG/ML  SOLN
100.0000 mL | Freq: Once | INTRAMUSCULAR | Status: AC | PRN
  Administered 2014-11-04: 100 mL via INTRAVENOUS

## 2014-11-04 MED ORDER — ONDANSETRON HCL 4 MG PO TABS
4.0000 mg | ORAL_TABLET | Freq: Four times a day (QID) | ORAL | Status: DC | PRN
  Administered 2014-11-05: 4 mg via ORAL
  Filled 2014-11-04: qty 1

## 2014-11-04 MED ORDER — IOHEXOL 300 MG/ML  SOLN
25.0000 mL | INTRAMUSCULAR | Status: AC
  Administered 2014-11-04: 25 mL via ORAL

## 2014-11-04 NOTE — H&P (Signed)
[facility] History and Physical  Brenda HoseDoris T Willmore NWG:[id number] DOB: 1951/01/04 DOA: 11/04/2014  Referring physician: Purvis SheffieldForrest Harrison, MD PCP: Gwynneth AlimentSANDERS,ROBYN N, MD   Chief Complaint: Nausea and vomiting  HPI: Brenda Murray is a 63 y.o. female presents with nausea and vomiting. She states that she has been having this feeling for a few months. She feels sick all the time. She states that she is not able to eat anything. She states that it comes back if she eats. If she does not eat she feels bad. She states that she has no blood in her vomit. She states that she has also noted some loose bowel movements. She states that was earlier this week. She has no belly pain. She does not smoke. She does not drink. She states that she is disabled for bipolar disorder. She states that she has been able to take her nerve medicines. She states that her symptoms started when she got some medicine for her muscles (methocarbomol according to the son)   Review of Systems:  Constitutional:  No weight loss, night sweats, Fevers, chills, ++fatigue.  HEENT:  No headaches, Difficulty swallowing  Cardio-vascular:  No chest pain, Orthopnea, PND, swelling in lower extremities  GI:  No heartburn, indigestion, abdominal pain, ++nausea, ++vomiting, ++diarrhea, ++change in bowel habits  Resp:  No shortness of breath with exertion or at rest. No excess mucus, no productive cough, No non-productive cough, No coughing up of blood Skin:  no rash or lesions GU:  no dysuria, change in color of urine, no urgency or frequency.  Musculoskeletal:  No joint pain or swelling. No decreased range of motion ++back pain  Psych:  No change in mood or affect. No depression or anxiety   Past Medical History  Diagnosis Date  . Anxiety   . Hypothyroidism   . Bipolar disorder   . History of indigestion   . Chronic back pain    Past Surgical History  Procedure Laterality Date  . Joint replacement      bilat knee  .  Carpel tunnel    . Tonsillectomy    . Total knee revision  05/01/2012    Procedure: TOTAL KNEE REVISION;  Surgeon: Raymon MuttonStephen D Lucey, MD;  [facility];  Service: Orthopedics;  Laterality: Right;   Social History:  reports that she has quit smoking. She does not have any smokeless tobacco history on file. She reports that she does not drink alcohol or use illicit drugs.  No Known Allergies  History reviewed. No pertinent family history.   Prior to Admission medications   Medication Sig Start Date End Date Taking? Authorizing Provider  ARIPiprazole (ABILIFY) 10 MG tablet Take 10 mg by mouth at bedtime.   Yes Historical Provider, MD  bumetanide (BUMEX) 2 MG tablet Take 2 mg by mouth daily.   Yes Historical Provider, MD  divalproex (DEPAKOTE ER) 250 MG 24 hr tablet Take 250 mg by mouth at bedtime.    Yes Historical Provider, MD  ibuprofen (ADVIL,MOTRIN) 200 MG tablet Take 400 mg by mouth every 6 (six) hours as needed for headache (headache).   Yes Historical Provider, MD  levothyroxine (SYNTHROID, LEVOTHROID) 112 MCG tablet Take 112 mcg by mouth daily before breakfast.   Yes Historical Provider, MD  lithium carbonate 300 MG capsule Take 300 mg by mouth at bedtime.   Yes Historical Provider, MD  Omega-3 Fatty Acids (OMEGA-3 FISH OIL PO) Take 1,200 mg by mouth daily.   Yes Historical Provider, MD  vitamin  B-12 (CYANOCOBALAMIN) 1000 MCG tablet Take 1,000 mcg by mouth daily.   Yes Historical Provider, MD  furosemide (LASIX) 20 MG tablet Take 1 tablet (20 mg total) by mouth once. Patient not taking: Reported on 11/04/2014 10/10/14   Earle GellBenjamin W Cartner, PA-C  HYDROcodone-acetaminophen (NORCO/VICODIN) 5-325 MG per tablet Take 1-2 tablets by mouth every 6 (six) hours as needed for moderate pain. Patient not taking: Reported on 11/04/2014 10/02/14   Olivia Mackielga M Otter, MD  methocarbamol (ROBAXIN-750) 750 MG tablet Take 1 tablet (750 mg total) by mouth 4 (four) times daily. Patient not taking: Reported on  11/04/2014 10/02/14   Olivia Mackielga M Otter, MD   Physical Exam: Filed Vitals:   11/04/14 1321 11/04/14 1635  BP: 127/60 114/71  Pulse: 90 67  Temp: 98.2 F (36.8 C) 97.8 F (36.6 C)  TempSrc: Oral Oral  Resp: 20 16  SpO2: 100% 100%    Wt Readings from Last 3 Encounters:  01/04/13 77.111 kg (170 lb)  11/05/12 78.019 kg (172 lb)  04/26/12 89.54 kg (197 lb 6.4 oz)    General:  Appears calm and comfortable Eyes: PERRL, normal lids, irises & conjunctiva ENT: grossly normal hearing, lips & tongue Neck: no LAD, masses or thyromegaly Cardiovascular: RRR, no m/r/g. No LE edema Respiratory: CTA bilaterally, no w/r/r. Normal respiratory effort. Abdomen: soft, ntnd no organomegaly Skin: no rash or induration seen on limited exam Musculoskeletal: grossly normal tone BUE/BLE Psychiatric: grossly normal mood and affect, speech fluent and appropriate Neurologic: grossly non-focal.          Labs on Admission:  Basic Metabolic Panel:  Recent Labs Lab 11/04/14 1406  NA 136*  K 2.8*  CL 90*  CO2 33*  GLUCOSE 91  BUN 18  CREATININE 0.84  CALCIUM 10.2   Liver Function Tests:  Recent Labs Lab 11/04/14 1406  AST 32  ALT 22  ALKPHOS 69  BILITOT 0.4  PROT 9.1*  ALBUMIN 4.1   No results for input(s): LIPASE, AMYLASE in the last 168 hours. No results for input(s): AMMONIA in the last 168 hours. CBC:  Recent Labs Lab 11/04/14 1406  WBC 5.4  NEUTROABS 3.3  HGB 11.8*  HCT 35.5*  MCV 81.2  PLT 289   Cardiac Enzymes: No results for input(s): CKTOTAL, CKMB, CKMBINDEX, TROPONINI in the last 168 hours.  BNP (last 3 results) No results for input(s): PROBNP in the last 8760 hours. CBG: No results for input(s): GLUCAP in the last 168 hours.  Radiological Exams on Admission: Ct Abdomen Pelvis W Contrast  11/04/2014   CLINICAL DATA:  Nausea, vomiting and generalized abdominal pain for the last few months. Weakness today.  EXAM: CT ABDOMEN AND PELVIS WITH CONTRAST  TECHNIQUE:  Multidetector CT imaging of the abdomen and pelvis was performed using the standard protocol following bolus administration of intravenous contrast.  CONTRAST:  100 mL OMNIPAQUE IOHEXOL 300 MG/ML  SOLN  COMPARISON:  CT abdomen and pelvis 01/04/2013.  FINDINGS: Mild linear atelectasis or scar is seen in the left lung base. A 0.3 cm right middle lobe nodule on image 6 is unchanged. There is no pleural or pericardial effusion. Very small hiatal hernia is identified.  The gallbladder, liver, spleen, adrenal glands and right kidney appear normal. Dilated calyx upper pole left kidney is incidentally noted and unchanged. Scattered aortoiliac atherosclerosis without aneurysm is identified. Uterus, adnexa and urinary bladder appear normal. A few scattered colonic diverticula are identified without evidence of diverticulitis. The stomach, small bowel and appendix appear normal. There  is no lymphadenopathy or fluid.  No lytic or sclerotic bony lesion is identified. There is lumbar spondylosis with trace anterolisthesis L3 on L4 due to facet arthropathy noted. Schmorl's node in the superior endplate of L4 is unchanged.  IMPRESSION: No acute abnormality abdomen or pelvis. No finding to explain the patient's symptoms.  Mild diverticulosis without diverticulitis.  Very small hiatal hernia.  Lumbar spondylosis most notable at L3-4.   Electronically Signed   By: Drusilla Kanner M.D.   On: 11/04/2014 18:16     Assessment/Plan Active Problems:   Nausea and vomiting   Hypothyroid   Metabolic alkalosis   Hypokalemia   1. Nausea and Vomiting -will admit for observation -she has had a CT scan done here which was negative -consider gastric emptying study -she does have a hiatal hernia noted -will get US liver and GB  2. Hypokalemia -will give KCL -follow up labs in am  3. Hypothyroid -check TSH -continue with home medications  4. Hyponatremia -will start on IVF NS at 50 cc/hr   Code Status: Full COde (must  indicate code status--if unknown or must be presumed, indicate so) DVT Prophylaxis:Heparin Family Communication: Son (indicate person spoken with, if applicable, with phone number if by telephone) Disposition Plan: Home (indicate anticipated LOS)  Time spent:  [facility] Pager [phone]

## 2014-11-04 NOTE — ED Notes (Signed)
Called CT to notify that pt has finished contrast 

## 2014-11-04 NOTE — ED Notes (Signed)
Pt reports n/v/d x1 month.  Pt has no pain in abdomen.  Pt reports emesis 3 times in past 24 hours with loose stool.  Pt denies any blood in emesis, stool, or urine.

## 2014-11-04 NOTE — ED Notes (Signed)
Pt reports intermittent nausea, vomiting and diarrhea for the last few months. States her belly feels sick. Pt reports increased weakness today. Has appt tomorrow but didn't think she could make it. VSS.

## 2014-11-04 NOTE — ED Provider Notes (Signed)
CSN: [id number]     Arrival date & time 11/04/14  1317 History   First MD Initiated Contact with Patient 11/04/14 1649     Chief Complaint  Patient presents with  . Emesis     (Consider location/radiation/quality/duration/timing/severity/associated sxs/prior Treatment) Patient is a 63 y.o. female presenting with vomiting. The history is provided by the patient.  Emesis Severity:  Mild Duration: intermittent for months, worse for the last few days. Timing:  Constant Number of daily episodes:  2-3 Quality:  Stomach contents Able to tolerate:  Liquids Progression:  Unchanged Chronicity:  Recurrent Recent urination:  Normal Relieved by:  Nothing Worsened by:  Nothing tried Ineffective treatments:  None tried Associated symptoms: diarrhea   Associated symptoms: no abdominal pain and no headaches   Diarrhea:    Quality:  Watery   Severity:  Mild   Timing:  Intermittent   Progression:  Improving   Past Medical History  Diagnosis Date  . Anxiety   . Hypothyroidism   . Bipolar disorder   . History of indigestion   . Chronic back pain    Past Surgical History  Procedure Laterality Date  . Joint replacement      bilat knee  . Carpel tunnel    . Tonsillectomy    . Total knee revision  05/01/2012    Procedure: TOTAL KNEE REVISION;  Surgeon: Raymon MuttonStephen D Lucey, MD;  [facility];  Service: Orthopedics;  Laterality: Right;   History reviewed. No pertinent family history. History  Substance Use Topics  . Smoking status: Former Games developermoker  . Smokeless tobacco: Not on file  . Alcohol Use: No   OB History    No data available     Review of Systems  Constitutional: Negative for fever and fatigue.  HENT: Negative for congestion and drooling.   Eyes: Negative for pain.  Respiratory: Negative for cough and shortness of breath.   Cardiovascular: Negative for chest pain.  Gastrointestinal: Positive for nausea, vomiting and diarrhea. Negative for abdominal pain.  Genitourinary:  Negative for dysuria and hematuria.  Musculoskeletal: Negative for back pain, gait problem and neck pain.  Skin: Negative for color change.  Neurological: Negative for dizziness and headaches.  Hematological: Negative for adenopathy.  Psychiatric/Behavioral: Negative for behavioral problems.  All other systems reviewed and are negative.     Allergies  Review of patient's allergies indicates no known allergies.  Home Medications   Prior to Admission medications   Medication Sig Start Date End Date Taking? Authorizing Provider  benztropine (COGENTIN) 0.5 MG tablet Take 0.5 mg by mouth at bedtime.    Historical Provider, MD  divalproex (DEPAKOTE ER) 250 MG 24 hr tablet Take 500 mg by mouth at bedtime.    Historical Provider, MD  furosemide (LASIX) 20 MG tablet Take 1 tablet (20 mg total) by mouth once. 10/10/14   Sharlene MottsBenjamin W Cartner, PA-C  HYDROcodone-acetaminophen (NORCO/VICODIN) 5-325 MG per tablet Take 1-2 tablets by mouth every 6 (six) hours as needed for moderate pain. 10/02/14   Olivia Mackielga M Otter, MD  ibuprofen (ADVIL,MOTRIN) 200 MG tablet Take 400 mg by mouth every 6 (six) hours as needed for headache (headache).    Historical Provider, MD  levothyroxine (SYNTHROID, LEVOTHROID) 112 MCG tablet Take 112 mcg by mouth daily before breakfast.    Historical Provider, MD  lithium carbonate 300 MG capsule Take 300 mg by mouth at bedtime.    Historical Provider, MD  methocarbamol (ROBAXIN-750) 750 MG tablet Take 1 tablet (750 mg total) by  mouth 4 (four) times daily. 10/02/14   Olivia Mackie, MD  Multiple Vitamin (MULTIVITAMIN WITH MINERALS) TABS tablet Take 1 tablet by mouth daily.    Historical Provider, MD  OLANZapine (ZYPREXA) 10 MG tablet Take 10 mg by mouth at bedtime.    Historical Provider, MD  Omega-3 Fatty Acids (OMEGA-3 FISH OIL PO) Take 1,200 mg by mouth daily.    Historical Provider, MD  vitamin B-12 (CYANOCOBALAMIN) 1000 MCG tablet Take 1,000 mcg by mouth daily.    Historical Provider,  MD   BP 114/71 mmHg  Pulse 67  Temp(Src) 97.8 F (36.6 C) (Oral)  Resp 16  SpO2 100% Physical Exam  Constitutional: She is oriented to person, place, and time. She appears well-developed and well-nourished.  HENT:  Head: Normocephalic and atraumatic.  Mouth/Throat: Oropharynx is clear and moist. No oropharyngeal exudate.  Eyes: Conjunctivae and EOM are normal. Pupils are equal, round, and reactive to light.  Neck: Normal range of motion. Neck supple.  Cardiovascular: Normal rate, regular rhythm, normal heart sounds and intact distal pulses.  Exam reveals no gallop and no friction rub.   No murmur heard. Pulmonary/Chest: Effort normal and breath sounds normal. No respiratory distress. She has no wheezes.  Abdominal: Soft. Bowel sounds are normal. There is no tenderness. There is no rebound and no guarding.  Musculoskeletal: Normal range of motion. She exhibits no edema or tenderness.  Neurological: She is alert and oriented to person, place, and time.  Skin: Skin is warm and dry.  Psychiatric: She has a normal mood and affect. Her behavior is normal.  Nursing note and vitals reviewed.   ED Course  Procedures (including critical care time) Labs Review Labs Reviewed  CBC WITH DIFFERENTIAL - Abnormal; Notable for the following:    Hemoglobin 11.8 (*)    HCT 35.5 (*)    Monocytes Relative 13 (*)    All other components within normal limits  COMPREHENSIVE METABOLIC PANEL - Abnormal; Notable for the following:    Sodium 136 (*)    Potassium 2.8 (*)    Chloride 90 (*)    CO2 33 (*)    Total Protein 9.1 (*)    GFR calc non Af Amer 72 (*)    GFR calc Af Amer 84 (*)    All other components within normal limits  URINALYSIS, ROUTINE W REFLEX MICROSCOPIC - Abnormal; Notable for the following:    APPearance CLOUDY (*)    Specific Gravity, Urine 1.040 (*)    All other components within normal limits  MAGNESIUM  COMPREHENSIVE METABOLIC PANEL  CBC  TSH  HEMOGLOBIN A1C  I-STAT  TROPOININ, ED    Imaging Review Ct Abdomen Pelvis W Contrast  11/04/2014   CLINICAL DATA:  Nausea, vomiting and generalized abdominal pain for the last few months. Weakness today.  EXAM: CT ABDOMEN AND PELVIS WITH CONTRAST  TECHNIQUE: Multidetector CT imaging of the abdomen and pelvis was performed using the standard protocol following bolus administration of intravenous contrast.  CONTRAST:  100 mL OMNIPAQUE IOHEXOL 300 MG/ML  SOLN  COMPARISON:  CT abdomen and pelvis 01/04/2013.  FINDINGS: Mild linear atelectasis or scar is seen in the left lung base. A 0.3 cm right middle lobe nodule on image 6 is unchanged. There is no pleural or pericardial effusion. Very small hiatal hernia is identified.  The gallbladder, liver, spleen, adrenal glands and right kidney appear normal. Dilated calyx upper pole left kidney is incidentally noted and unchanged. Scattered aortoiliac atherosclerosis without aneurysm is identified.  Uterus, adnexa and urinary bladder appear normal. A few scattered colonic diverticula are identified without evidence of diverticulitis. The stomach, small bowel and appendix appear normal. There is no lymphadenopathy or fluid.  No lytic or sclerotic bony lesion is identified. There is lumbar spondylosis with trace anterolisthesis L3 on L4 due to facet arthropathy noted. Schmorl's node in the superior endplate of L4 is unchanged.  IMPRESSION: No acute abnormality abdomen or pelvis. No finding to explain the patient's symptoms.  Mild diverticulosis without diverticulitis.  Very small hiatal hernia.  Lumbar spondylosis most notable at L3-4.   Electronically Signed   By: Drusilla Kannerhomas  Dalessio M.D.   On: 11/04/2014 18:16     EKG Interpretation   Date/Time:  Monday November 04 2014 19:49:10 EST Ventricular Rate:  67 PR Interval:    QRS Duration: 67 QT Interval:  691 QTC Calculation: 730 R Axis:   45 Text Interpretation:  sinus rhythm Low voltage, precordial leads  Borderline T abnormalities,  anterior leads Prolonged QT interval No  significant change since last tracing Confirmed by Sophina Mitten  MD, Rainey Rodger  ([id number]) on 11/04/2014 8:22:09 PM      MDM   Final diagnoses:  Abdominal pain, generalized  Nausea and vomiting, vomiting of unspecified type  Metabolic alkalosis  Hypokalemia    5:13 PM 63 y.o. female w hx of hypothyroidism who presents with nausea, vomiting, and diarrhea intermittently over the last 2 months. She notes worsening recently, particularly since yesterday. Last formed stool was this morning. She notes symptoms are worse before and after eating. She denies any abdominal pain and mostly complains of nausea. She is afebrile and vital signs are unremarkable here. She does have a hypochloremic, hypokalemic, metabolic alkalosis which is likely consistent with her intermittent and ongoing vomiting. We'll start IV fluids, potassium supplementation, and get CT scan. She notes that her PCP had ordered an ultrasound of the right upper quadrant to rule out gallstones which was scheduled for tomorrow.  Will admit to hospitalist for electrolyte repletion, IVF, further workup.     Purvis SheffieldForrest Patrick Salemi, MD 11/04/14 [phone]

## 2014-11-05 ENCOUNTER — Observation Stay (HOSPITAL_COMMUNITY): Payer: Commercial Managed Care - HMO

## 2014-11-05 DIAGNOSIS — E876 Hypokalemia: Secondary | ICD-10-CM

## 2014-11-05 DIAGNOSIS — E039 Hypothyroidism, unspecified: Secondary | ICD-10-CM

## 2014-11-05 DIAGNOSIS — R1110 Vomiting, unspecified: Secondary | ICD-10-CM

## 2014-11-05 DIAGNOSIS — R112 Nausea with vomiting, unspecified: Secondary | ICD-10-CM | POA: Diagnosis not present

## 2014-11-05 DIAGNOSIS — E873 Alkalosis: Secondary | ICD-10-CM

## 2014-11-05 LAB — COMPREHENSIVE METABOLIC PANEL
ALBUMIN: 3.4 g/dL — AB (ref 3.5–5.2)
ALK PHOS: 62 U/L (ref 39–117)
ALT: 19 U/L (ref 0–35)
AST: 26 U/L (ref 0–37)
Anion gap: 10 (ref 5–15)
BUN: 11 mg/dL (ref 6–23)
CALCIUM: 9.4 mg/dL (ref 8.4–10.5)
CO2: 29 mEq/L (ref 19–32)
Chloride: 97 mEq/L (ref 96–112)
Creatinine, Ser: 0.66 mg/dL (ref 0.50–1.10)
GFR calc non Af Amer: >90 mL/min (ref >90)
GLUCOSE: 85 mg/dL (ref 70–99)
POTASSIUM: 3 meq/L — AB (ref 3.7–5.3)
Sodium: 136 mEq/L — ABNORMAL LOW (ref 137–147)
TOTAL PROTEIN: 7.7 g/dL (ref 6.0–8.3)
Total Bilirubin: 0.4 mg/dL (ref 0.3–1.2)

## 2014-11-05 LAB — GLUCOSE, CAPILLARY: Glucose-Capillary: 92 mg/dL (ref 70–99)

## 2014-11-05 LAB — CBC
HEMATOCRIT: 32.8 % — AB (ref 36.0–46.0)
HEMOGLOBIN: 10.5 g/dL — AB (ref 12.0–15.0)
MCH: 25.7 pg — AB (ref 26.0–34.0)
MCHC: 32 g/dL (ref 30.0–36.0)
MCV: 80.2 fL (ref 78.0–100.0)
Platelets: 281 10*3/uL (ref 150–400)
RBC: 4.09 MIL/uL (ref 3.87–5.11)
RDW: 13.3 % (ref 11.5–15.5)
WBC: 4.3 10*3/uL (ref 4.0–10.5)

## 2014-11-05 LAB — VALPROIC ACID LEVEL: Valproic Acid Lvl: 11.7 ug/mL — ABNORMAL LOW (ref 50.0–100.0)

## 2014-11-05 LAB — HEMOGLOBIN A1C
HEMOGLOBIN A1C: 5.9 % — AB (ref <5.7)
Mean Plasma Glucose: 123 mg/dL — ABNORMAL HIGH (ref <117)

## 2014-11-05 LAB — LITHIUM LEVEL: Lithium Lvl: 0.61 mEq/L — ABNORMAL LOW (ref 0.80–1.40)

## 2014-11-05 LAB — TSH: TSH: 3.43 u[IU]/mL (ref 0.350–4.500)

## 2014-11-05 LAB — LIPASE, BLOOD: Lipase: 41 U/L (ref 11–59)

## 2014-11-05 LAB — MAGNESIUM: MAGNESIUM: 2.2 mg/dL (ref 1.5–2.5)

## 2014-11-05 MED ORDER — POTASSIUM CHLORIDE CRYS ER 20 MEQ PO TBCR
40.0000 meq | EXTENDED_RELEASE_TABLET | Freq: Two times a day (BID) | ORAL | Status: DC
  Administered 2014-11-05 – 2014-11-07 (×4): 40 meq via ORAL
  Filled 2014-11-05 (×6): qty 2

## 2014-11-05 MED ORDER — POTASSIUM CHLORIDE CRYS ER 20 MEQ PO TBCR
40.0000 meq | EXTENDED_RELEASE_TABLET | Freq: Two times a day (BID) | ORAL | Status: DC
  Administered 2014-11-05: 40 meq via ORAL

## 2014-11-05 NOTE — Progress Notes (Signed)
UR completed 

## 2014-11-05 NOTE — Consult Note (Signed)
Reason for Consult: Nausea/Vomiting Referring Physician: [facility]  Brenda Murray HPI: This is a 63 year old female with a PMH of bipolar disorder, anxiety, and hypothyroidism who is admitted for nausea and vomiting.  She reports the onset of her symptoms two months ago and it has been progressively worsening.  Any PO intake will induce her symptoms, but no all PO intake will cause her to have nausea and vomiting.  Today she is feeling better and she has been able to eat her breakfast and lunch without any difficulty.  No reports of hematemesis, hematochezia, or melena.  This is the second time she has had this issue.  She states that she had a bout of nausea and vomiting one year ago and over time it resolved.  Work up in the ER was negative for any abnormalities.  She was evaluated by Dr. Loreta AveMann in the past for a routine colonoscopy, but no prior EGDs.  GERD is an issue and she reports intermittent dysphagia.  She feels that her food can lodge and cause an enlargement in the epigastric region.  The patient does not know of any significant weight loss as a result of her symptoms and there is no report of any abdominal pain.  Past Medical History  Diagnosis Date  . Anxiety   . Hypothyroidism   . Bipolar disorder   . History of indigestion   . Chronic back pain     Past Surgical History  Procedure Laterality Date  . Joint replacement      bilat knee  . Carpel tunnel    . Tonsillectomy    . Total knee revision  05/01/2012    Procedure: TOTAL KNEE REVISION;  Surgeon: Raymon MuttonStephen D Lucey, MD;  [facility];  Service: Orthopedics;  Laterality: Right;    History reviewed. No pertinent family history.  Social History:  reports that she has quit smoking. She does not have any smokeless tobacco history on file. She reports that she does not drink alcohol or use illicit drugs.  Allergies: No Known Allergies  Medications:  Scheduled: . ARIPiprazole  10 mg Oral QHS  . aspirin EC  81 mg  Oral Daily  . divalproex  250 mg Oral QHS  . folic acid  1 mg Oral Daily  . heparin  5,000 Units Subcutaneous 3 times per day  . levothyroxine  112 mcg Oral QAC breakfast  . lithium carbonate  300 mg Oral QHS  . multivitamin with minerals  1 tablet Oral Daily  . potassium chloride  40 mEq Oral BID  . sodium chloride  3 mL Intravenous Q12H  . thiamine  100 mg Oral Daily   Continuous: . sodium chloride 50 mL/hr at 11/05/14 0118    Results for orders placed or performed during the hospital encounter of 11/04/14 (from the past 24 hour(s))  CBC with Differential     Status: Abnormal   Collection Time: 11/04/14  2:06 PM  Result Value Ref Range   WBC 5.4 4.0 - 10.5 K/uL   RBC 4.37 3.87 - 5.11 MIL/uL   Hemoglobin 11.8 (L) 12.0 - 15.0 g/dL   HCT 16.135.5 (L) 09.636.0 - 04.546.0 %   MCV 81.2 78.0 - 100.0 fL   MCH 27.0 26.0 - 34.0 pg   MCHC 33.2 30.0 - 36.0 g/dL   RDW 40.913.0 81.111.5 - 91.415.5 %   Platelets 289 150 - 400 K/uL   Neutrophils Relative % 61 43 - 77 %   Neutro Abs 3.3  1.7 - 7.7 K/uL   Lymphocytes Relative 25 12 - 46 %   Lymphs Abs 1.3 0.7 - 4.0 K/uL   Monocytes Relative 13 (H) 3 - 12 %   Monocytes Absolute 0.7 0.1 - 1.0 K/uL   Eosinophils Relative 1 0 - 5 %   Eosinophils Absolute 0.0 0.0 - 0.7 K/uL   Basophils Relative 0 0 - 1 %   Basophils Absolute 0.0 0.0 - 0.1 K/uL  Comprehensive metabolic panel     Status: Abnormal   Collection Time: 11/04/14  2:06 PM  Result Value Ref Range   Sodium 136 (L) 137 - 147 mEq/L   Potassium 2.8 (LL) 3.7 - 5.3 mEq/L   Chloride 90 (L) 96 - 112 mEq/L   CO2 33 (H) 19 - 32 mEq/L   Glucose, Bld 91 70 - 99 mg/dL   BUN 18 6 - 23 mg/dL   Creatinine, Ser 1.61 0.50 - 1.10 mg/dL   Calcium 09.6 8.4 - 04.5 mg/dL   Total Protein 9.1 (H) 6.0 - 8.3 g/dL   Albumin 4.1 3.5 - 5.2 g/dL   AST 32 0 - 37 U/L   ALT 22 0 - 35 U/L   Alkaline Phosphatase 69 39 - 117 U/L   Total Bilirubin 0.4 0.3 - 1.2 mg/dL   GFR calc non Af Amer 72 (L) >90 mL/min   GFR calc Af Amer 84 (L)  >90 mL/min   Anion gap 13 5 - 15  I-stat troponin, ED (only if pt is 64 y.o. or older & pain is above umbilicus) - do not order at [facility]     Status: None   Collection Time: 11/04/14  2:12 PM  Result Value Ref Range   Troponin i, poc 0.00 0.00 - 0.08 ng/mL   Comment 3          Urinalysis, Routine w reflex microscopic     Status: Abnormal   Collection Time: 11/04/14  6:58 PM  Result Value Ref Range   Color, Urine YELLOW YELLOW   APPearance CLOUDY (A) CLEAR   Specific Gravity, Urine 1.040 (H) 1.005 - 1.030   pH 6.0 5.0 - 8.0   Glucose, UA NEGATIVE NEGATIVE mg/dL   Hgb urine dipstick NEGATIVE NEGATIVE   Bilirubin Urine NEGATIVE NEGATIVE   Ketones, ur NEGATIVE NEGATIVE mg/dL   Protein, ur NEGATIVE NEGATIVE mg/dL   Urobilinogen, UA 0.2 0.0 - 1.0 mg/dL   Nitrite NEGATIVE NEGATIVE   Leukocytes, UA NEGATIVE NEGATIVE  Comprehensive metabolic panel     Status: Abnormal   Collection Time: 11/05/14  4:45 AM  Result Value Ref Range   Sodium 136 (L) 137 - 147 mEq/L   Potassium 3.0 (L) 3.7 - 5.3 mEq/L   Chloride 97 96 - 112 mEq/L   CO2 29 19 - 32 mEq/L   Glucose, Bld 85 70 - 99 mg/dL   BUN 11 6 - 23 mg/dL   Creatinine, Ser 4.09 0.50 - 1.10 mg/dL   Calcium 9.4 8.4 - 81.1 mg/dL   Total Protein 7.7 6.0 - 8.3 g/dL   Albumin 3.4 (L) 3.5 - 5.2 g/dL   AST 26 0 - 37 U/L   ALT 19 0 - 35 U/L   Alkaline Phosphatase 62 39 - 117 U/L   Total Bilirubin 0.4 0.3 - 1.2 mg/dL   GFR calc non Af Amer >90 >90 mL/min   GFR calc Af Amer >90 >90 mL/min   Anion gap 10 5 - 15  CBC  Status: Abnormal   Collection Time: 11/05/14  4:45 AM  Result Value Ref Range   WBC 4.3 4.0 - 10.5 K/uL   RBC 4.09 3.87 - 5.11 MIL/uL   Hemoglobin 10.5 (L) 12.0 - 15.0 g/dL   HCT 16.1 (L) 09.6 - 04.5 %   MCV 80.2 78.0 - 100.0 fL   MCH 25.7 (L) 26.0 - 34.0 pg   MCHC 32.0 30.0 - 36.0 g/dL   RDW 40.9 81.1 - 91.4 %   Platelets 281 150 - 400 K/uL  Glucose, capillary     Status: None   Collection Time: 11/05/14  8:55 AM  Result  Value Ref Range   Glucose-Capillary 92 70 - 99 mg/dL   Comment 1 Notify RN    Comment 2 Documented in Chart      Ct Abdomen Pelvis W Contrast  11/04/2014   CLINICAL DATA:  Nausea, vomiting and generalized abdominal pain for the last few months. Weakness today.  EXAM: CT ABDOMEN AND PELVIS WITH CONTRAST  TECHNIQUE: Multidetector CT imaging of the abdomen and pelvis was performed using the standard protocol following bolus administration of intravenous contrast.  CONTRAST:  100 mL OMNIPAQUE IOHEXOL 300 MG/ML  SOLN  COMPARISON:  CT abdomen and pelvis 01/04/2013.  FINDINGS: Mild linear atelectasis or scar is seen in the left lung base. A 0.3 cm right middle lobe nodule on image 6 is unchanged. There is no pleural or pericardial effusion. Very small hiatal hernia is identified.  The gallbladder, liver, spleen, adrenal glands and right kidney appear normal. Dilated calyx upper pole left kidney is incidentally noted and unchanged. Scattered aortoiliac atherosclerosis without aneurysm is identified. Uterus, adnexa and urinary bladder appear normal. A few scattered colonic diverticula are identified without evidence of diverticulitis. The stomach, small bowel and appendix appear normal. There is no lymphadenopathy or fluid.  No lytic or sclerotic bony lesion is identified. There is lumbar spondylosis with trace anterolisthesis L3 on L4 due to facet arthropathy noted. Schmorl's node in the superior endplate of L4 is unchanged.  IMPRESSION: No acute abnormality abdomen or pelvis. No finding to explain the patient's symptoms.  Mild diverticulosis without diverticulitis.  Very small hiatal hernia.  Lumbar spondylosis most notable at L3-4.   Electronically Signed   By: Drusilla Kanner M.D.   On: 11/04/2014 18:16   US Abdomen Limited  11/05/2014   CLINICAL DATA:  Nausea vomiting.  EXAM: US ABDOMEN LIMITED - RIGHT UPPER QUADRANT  COMPARISON:  CT 11/04/2014.  FINDINGS: Gallbladder:  No gallstones or wall thickening  visualized. No sonographic Murphy sign noted.  Common bile duct:  Diameter: 4.7 mm.  Liver:  Liver is echogenic consistent with fatty infiltration . No focal abnormality.  IMPRESSION: Fatty infiltration liver otherwise negative exam.   Electronically Signed   By: Maisie Fus  Register   On: 11/05/2014 09:16    ROS:  As stated above in the HPI otherwise negative.  Blood pressure 117/66, pulse 59, temperature 98.1 F (36.7 C), temperature source Oral, resp. rate 16, weight 87.136 kg (192 lb 1.6 oz), SpO2 100 %.    PE: Gen: NAD, Alert and Oriented HEENT:  [place]/AT, EOMI Neck: Supple, no LAD Lungs: CTA Bilaterally CV: RRR without M/G/R ABM: Soft, NTND, +BS Ext: No C/C/E  Assessment/Plan: 1) Nausea/Vomiting. 2) Intermittent dysphagia 3) GERD.   I cannot discern a clear source for her symptoms, but it is reasonable to pursue an EGD for further evaluation.  This may be secondary to GERD, but other considerations would be  biliary dyskinesia and gastroparesis.  Currently she appears well, but she reports that this is the typical course for her symptoms.  Plan: 1) EGD.  Khaza Blansett D 11/05/2014, 12:16 PM

## 2014-11-05 NOTE — Progress Notes (Signed)
PATIENT DETAILS Name: Brenda Murray Age: 63 y.o. Sex: female Date of Birth: 1951-07-01 Admit Date: 11/04/2014 Admitting Physician Allyne Gee, MD JME:[id number] N, MD  Subjective: No nausea or vomiting this am.  Assessment/Plan: Active Problems:   Nausea and vomiting:going on for the past 2 months. CT Abd/Pelvis and RUQ ultrasound on 12/14 negative for acute abnormalities. Etiology remains unknown. GI consulted for EGD. Check Lipase-but abdomen is benign on exam.    Hypokalemia:secondary to vomiting,Bumex. Replete and recheck. Hold Bumex for now.    Met Alkalosis:secondary to vomiting,Bumex.Better with hydration and discontinuation of Bumex.    Mild Hyponatremia:secondary to dehydration/vomiting. Supportive care.    Hypothyroidism:c/w Levothyroxine. TSH normal.    Hx of Bipolar Disorder/Amxiety:On Depakote/Lithium-will check level today. Appears stable.   Disposition: Remain inpatient  Antibiotics:  None  Anti-infectives    None     DVT Prophylaxis: Prophylactic  Heparin  Code Status: Full code   Family Communication None at bedside  Procedures:  Non  CONSULTS:  GI  Time spent 40 minutes-which includes 50% of the time with face-to-face with patient/ family and coordinating care related to the above assessment and plan.  MEDICATIONS: Scheduled Meds: . ARIPiprazole  10 mg Oral QHS  . aspirin EC  81 mg Oral Daily  . divalproex  250 mg Oral QHS  . folic acid  1 mg Oral Daily  . heparin  5,000 Units Subcutaneous 3 times per day  . levothyroxine  112 mcg Oral QAC breakfast  . lithium carbonate  300 mg Oral QHS  . multivitamin with minerals  1 tablet Oral Daily  . potassium chloride  40 mEq Oral BID  . sodium chloride  3 mL Intravenous Q12H  . thiamine  100 mg Oral Daily   Continuous Infusions: . sodium chloride 50 mL/hr at 11/05/14 0118   PRN Meds:.acetaminophen **OR** acetaminophen, ondansetron **OR** ondansetron (ZOFRAN)  IV    PHYSICAL EXAM: Vital signs in last 24 hours: Filed Vitals:   11/04/14 1635 11/04/14 2203 11/05/14 0519 11/05/14 0925  BP: 114/71 116/72 96/67 117/66  Pulse: 67 70 79 59  Temp: 97.8 F (36.6 C) 97.9 F (36.6 C) 98.6 F (37 C) 98.1 F (36.7 C)  TempSrc: Oral Oral Oral Oral  Resp: 16 16 16 16   Weight:  87.136 kg (192 lb 1.6 oz)    SpO2: 100% 100% 100% 100%    Weight change:  Filed Weights   11/04/14 2203  Weight: 87.136 kg (192 lb 1.6 oz)   Body mass index is 38.78 kg/(m^2).   Gen Exam: Awake and alert with clear speech.   Neck: Supple, No JVD.   Chest: B/L Clear.   CVS: S1 S2 Regular, no murmurs.  Abdomen: soft, BS +, non tender, non distended.  Extremities: no edema, lower extremities warm to touch. Neurologic: Non Focal.   Skin: No Rash.   Wounds: N/A.    Intake/Output from previous day:  Intake/Output Summary (Last 24 hours) at 11/05/14 1329 Last data filed at 11/05/14 0500  Gross per 24 hour  Intake    185 ml  Output      0 ml  Net    185 ml     LAB RESULTS: CBC  Recent Labs Lab 11/04/14 1406 11/05/14 0445  WBC 5.4 4.3  HGB 11.8* 10.5*  HCT 35.5* 32.8*  PLT 289 281  MCV 81.2 80.2  MCH 27.0 25.7*  MCHC 33.2 32.0  RDW 13.0 13.3  LYMPHSABS  1.3  --   MONOABS 0.7  --   EOSABS 0.0  --   BASOSABS 0.0  --     Chemistries   Recent Labs Lab 11/04/14 0035 11/04/14 1406 11/05/14 0445  NA  --  136* 136*  K  --  2.8* 3.0*  CL  --  90* 97  CO2  --  33* 29  GLUCOSE  --  91 85  BUN  --  18 11  CREATININE  --  0.84 0.66  CALCIUM  --  10.2 9.4  MG 2.2  --   --     CBG:  Recent Labs Lab 11/05/14 0855  GLUCAP 92    GFR CrCl cannot be calculated (Unknown ideal weight.).  Coagulation profile No results for input(s): INR, PROTIME in the last 168 hours.  Cardiac Enzymes No results for input(s): CKMB, TROPONINI, MYOGLOBIN in the last 168 hours.  Invalid input(s): CK  Invalid input(s): POCBNP No results for input(s): DDIMER in  the last 72 hours.  Recent Labs  11/04/14 0035  HGBA1C 5.9*   No results for input(s): CHOL, HDL, LDLCALC, TRIG, CHOLHDL, LDLDIRECT in the last 72 hours.  Recent Labs  11/04/14 0035  TSH 3.430   No results for input(s): VITAMINB12, FOLATE, FERRITIN, TIBC, IRON, RETICCTPCT in the last 72 hours. No results for input(s): LIPASE, AMYLASE in the last 72 hours.  Urine Studies No results for input(s): UHGB, CRYS in the last 72 hours.  Invalid input(s): UACOL, UAPR, USPG, UPH, UTP, UGL, UKET, UBIL, UNIT, UROB, ULEU, UEPI, UWBC, URBC, UBAC, CAST, UCOM, BILUA  MICROBIOLOGY: No results found for this or any previous visit (from the past 240 hour(s)).  RADIOLOGY STUDIES/RESULTS: Dg Chest 2 View  10/10/2014   CLINICAL DATA:  Extremity swelling for 2 weeks. Shortness of breath. Back pain.  EXAM: CHEST  2 VIEW  COMPARISON:  04/26/2012  FINDINGS: Metallic clip projected over the heart. Normal heart size and pulmonary vascularity. Slightly shallow inspiration. No focal airspace disease or consolidation in the lungs. Persistent patchy infiltration vaguely demonstrated in the right mid lung and left lung base, similar to prior study. This is likely fibrosis. No blunting of costophrenic angles. No pneumothorax.  IMPRESSION: No active cardiopulmonary disease.   Electronically Signed   By: Lucienne Capers M.D.   On: 10/10/2014 22:34   Ct Abdomen Pelvis W Contrast  11/04/2014   CLINICAL DATA:  Nausea, vomiting and generalized abdominal pain for the last few months. Weakness today.  EXAM: CT ABDOMEN AND PELVIS WITH CONTRAST  TECHNIQUE: Multidetector CT imaging of the abdomen and pelvis was performed using the standard protocol following bolus administration of intravenous contrast.  CONTRAST:  100 mL OMNIPAQUE IOHEXOL 300 MG/ML  SOLN  COMPARISON:  CT abdomen and pelvis 01/04/2013.  FINDINGS: Mild linear atelectasis or scar is seen in the left lung base. A 0.3 cm right middle lobe nodule on image 6 is  unchanged. There is no pleural or pericardial effusion. Very small hiatal hernia is identified.  The gallbladder, liver, spleen, adrenal glands and right kidney appear normal. Dilated calyx upper pole left kidney is incidentally noted and unchanged. Scattered aortoiliac atherosclerosis without aneurysm is identified. Uterus, adnexa and urinary bladder appear normal. A few scattered colonic diverticula are identified without evidence of diverticulitis. The stomach, small bowel and appendix appear normal. There is no lymphadenopathy or fluid.  No lytic or sclerotic bony lesion is identified. There is lumbar spondylosis with trace anterolisthesis L3 on L4 due to facet arthropathy  noted. Schmorl's node in the superior endplate of L4 is unchanged.  IMPRESSION: No acute abnormality abdomen or pelvis. No finding to explain the patient's symptoms.  Mild diverticulosis without diverticulitis.  Very small hiatal hernia.  Lumbar spondylosis most notable at L3-4.   Electronically Signed   By: Inge Rise M.D.   On: 11/04/2014 18:16   US Abdomen Limited  11/05/2014   CLINICAL DATA:  Nausea vomiting.  EXAM: US ABDOMEN LIMITED - RIGHT UPPER QUADRANT  COMPARISON:  CT 11/04/2014.  FINDINGS: Gallbladder:  No gallstones or wall thickening visualized. No sonographic Murphy sign noted.  Common bile duct:  Diameter: 4.7 mm.  Liver:  Liver is echogenic consistent with fatty infiltration . No focal abnormality.  IMPRESSION: Fatty infiltration liver otherwise negative exam.   Electronically Signed   By: Marcello Moores  Register   On: 11/05/2014 09:16    Oren Binet, MD  [facility] Pager:[phone]  If 7PM-7AM, please contact night-coverage [email or web address] Password [email or web address] 11/05/2014, 1:29 PM   LOS: 1 day

## 2014-11-06 ENCOUNTER — Encounter (HOSPITAL_COMMUNITY)
Admission: EM | Disposition: A | Discharge status: Home / Self Care | Payer: Commercial Managed Care - HMO | Source: Home / Self Care | Attending: Emergency Medicine

## 2014-11-06 ENCOUNTER — Encounter (HOSPITAL_COMMUNITY): Payer: Self-pay | Admitting: *Deleted

## 2014-11-06 DIAGNOSIS — R112 Nausea with vomiting, unspecified: Secondary | ICD-10-CM | POA: Diagnosis not present

## 2014-11-06 HISTORY — PX: ESOPHAGOGASTRODUODENOSCOPY: SHX5428

## 2014-11-06 LAB — BASIC METABOLIC PANEL
ANION GAP: 10 (ref 5–15)
BUN: 8 mg/dL (ref 6–23)
CALCIUM: 9.6 mg/dL (ref 8.4–10.5)
CHLORIDE: 101 meq/L (ref 96–112)
CO2: 27 meq/L (ref 19–32)
Creatinine, Ser: 0.74 mg/dL (ref 0.50–1.10)
GFR calc non Af Amer: 89 mL/min — ABNORMAL LOW (ref >90)
Glucose, Bld: 96 mg/dL (ref 70–99)
Potassium: 3.8 mEq/L (ref 3.7–5.3)
SODIUM: 138 meq/L (ref 137–147)

## 2014-11-06 LAB — GLUCOSE, CAPILLARY: GLUCOSE-CAPILLARY: 95 mg/dL (ref 70–99)

## 2014-11-06 SURGERY — EGD (ESOPHAGOGASTRODUODENOSCOPY)
Anesthesia: Moderate Sedation

## 2014-11-06 MED ORDER — FENTANYL CITRATE 0.05 MG/ML IJ SOLN
INTRAMUSCULAR | Status: AC
  Filled 2014-11-06: qty 2

## 2014-11-06 MED ORDER — LIDOCAINE VISCOUS 2 % MT SOLN
OROMUCOSAL | Status: DC | PRN
  Administered 2014-11-06: 20 mL via OROMUCOSAL

## 2014-11-06 MED ORDER — MIDAZOLAM HCL 10 MG/2ML IJ SOLN
INTRAMUSCULAR | Status: DC | PRN
  Administered 2014-11-06 (×2): 2 mg via INTRAVENOUS

## 2014-11-06 MED ORDER — MIDAZOLAM HCL 5 MG/ML IJ SOLN
INTRAMUSCULAR | Status: AC
  Filled 2014-11-06: qty 2

## 2014-11-06 MED ORDER — DIPHENHYDRAMINE HCL 50 MG/ML IJ SOLN
INTRAMUSCULAR | Status: AC
  Filled 2014-11-06: qty 1

## 2014-11-06 MED ORDER — LIDOCAINE VISCOUS 2 % MT SOLN
OROMUCOSAL | Status: AC
  Filled 2014-11-06: qty 15

## 2014-11-06 MED ORDER — FENTANYL CITRATE 0.05 MG/ML IJ SOLN
INTRAMUSCULAR | Status: DC | PRN
  Administered 2014-11-06 (×2): 25 ug via INTRAVENOUS

## 2014-11-06 NOTE — Progress Notes (Signed)
PATIENT DETAILS Name: Brenda Murray Age: 63 y.o. Sex: female Date of Birth: 1951-09-20 Admit Date: 11/04/2014 Admitting Physician Allyne Gee, MD NVB:[id number] N, MD  Subjective: No nausea or vomiting since admission.No abd pain. Ambulating in the room.  Assessment/Plan: Active Problems:   Nausea and vomiting:going on for the past 2 months. CT Abd/Pelvis and RUQ ultrasound on 12/14 negative for acute abnormalities. Lipase levels normal.Etiology remains unknown. GI consulted for EGD. If EGD neg, suspect could be discharged with anti-emetics, further work up could be pursued in the outpatient setting.    Hypokalemia:secondary to vomiting,Bumex. Resolved    Met Alkalosis:secondary to vomiting,Bumex.Resolved with hydration and discontinuation of Bumex.    Mild Hyponatremia:secondary to dehydration/vomiting. Resolved with upportive care.    Hypothyroidism:c/w Levothyroxine. TSH normal.    Hx of Bipolar Disorder/Amxiety:On Depakote/Lithium- levels within normal limit. Appears stable.   Disposition: Remain inpatient-?Home after EGD  Antibiotics:  None  Anti-infectives    None     DVT Prophylaxis: Prophylactic  Heparin  Code Status: Full code   Family Communication None at bedside  Procedures:  Non  CONSULTS:  GI  MEDICATIONS: Scheduled Meds: . ARIPiprazole  10 mg Oral QHS  . aspirin EC  81 mg Oral Daily  . divalproex  250 mg Oral QHS  . folic acid  1 mg Oral Daily  . heparin  5,000 Units Subcutaneous 3 times per day  . levothyroxine  112 mcg Oral QAC breakfast  . lithium carbonate  300 mg Oral QHS  . multivitamin with minerals  1 tablet Oral Daily  . potassium chloride  40 mEq Oral BID  . sodium chloride  3 mL Intravenous Q12H  . thiamine  100 mg Oral Daily   Continuous Infusions: . sodium chloride 10 mL/hr at 11/05/14 1500   PRN Meds:.acetaminophen **OR** acetaminophen, ondansetron **OR** ondansetron (ZOFRAN) IV    PHYSICAL  EXAM: Vital signs in last 24 hours: Filed Vitals:   11/05/14 0925 11/05/14 1731 11/05/14 2046 11/06/14 0437  BP: 117/66 122/68 104/49 105/53  Pulse: 59 68 62 63  Temp: 98.1 F (36.7 C) 98.4 F (36.9 C) 98.1 F (36.7 C) 98.2 F (36.8 C)  TempSrc: Oral Oral Oral Oral  Resp: _0 Weight:   89.449 kg (197 lb 3.2 oz)   SpO2: 100% 99% 100% 98%    Weight change: 2.313 kg (5 lb 1.6 oz) Filed Weights   11/04/14 2203 11/05/14 2046  Weight: 87.136 kg (192 lb 1.6 oz) 89.449 kg (197 lb 3.2 oz)   Body mass index is 39.81 kg/(m^2).   Gen Exam: Awake and alert with clear speech.   Neck: Supple, No JVD.   Chest: B/L Clear. No rales or rhonchi  CVS: S1 S2 Regular, no murmurs.  Abdomen: soft, BS +, non tender, non distended.  Extremities: no edema, lower extremities warm to touch. Neurologic: Non Focal.   Skin: No Rash.   Wounds: N/A.    Intake/Output from previous day:  Intake/Output Summary (Last 24 hours) at 11/06/14 0854 Last data filed at 11/05/14 1700  Gross per 24 hour  Intake    480 ml  Output      0 ml  Net    480 ml     LAB RESULTS: CBC  Recent Labs Lab 11/04/14 1406 11/05/14 0445  WBC 5.4 4.3  HGB 11.8* 10.5*  HCT 35.5* 32.8*  PLT 289 281  MCV 81.2 80.2  MCH 27.0 25.7*  MCHC 33.2 32.0  RDW 13.0 13.3  LYMPHSABS 1.3  --   MONOABS 0.7  --   EOSABS 0.0  --   BASOSABS 0.0  --     Chemistries   Recent Labs Lab 11/04/14 0035 11/04/14 1406 11/05/14 0445 11/06/14 0542  NA  --  136* 136* 138  K  --  2.8* 3.0* 3.8  CL  --  90* 97 101  CO2  --  33* 29 27  GLUCOSE  --  91 85 96  BUN  --  _0 CREATININE  --  0.84 0.66 0.74  CALCIUM  --  10.2 9.4 9.6  MG 2.2  --   --   --     CBG:  Recent Labs Lab 11/05/14 0855 11/06/14 0742  GLUCAP 92 95    GFR CrCl cannot be calculated (Unknown ideal weight.).  Coagulation profile No results for input(s): INR, PROTIME in the last 168 hours.  Cardiac Enzymes No results for input(s): CKMB,  TROPONINI, MYOGLOBIN in the last 168 hours.  Invalid input(s): CK  Invalid input(s): POCBNP No results for input(s): DDIMER in the last 72 hours.  Recent Labs  11/04/14 0035  HGBA1C 5.9*   No results for input(s): CHOL, HDL, LDLCALC, TRIG, CHOLHDL, LDLDIRECT in the last 72 hours.  Recent Labs  11/04/14 0035  TSH 3.430   No results for input(s): VITAMINB12, FOLATE, FERRITIN, TIBC, IRON, RETICCTPCT in the last 72 hours.  Recent Labs  11/05/14 1453  LIPASE 41    Urine Studies No results for input(s): UHGB, CRYS in the last 72 hours.  Invalid input(s): UACOL, UAPR, USPG, UPH, UTP, UGL, UKET, UBIL, UNIT, UROB, ULEU, UEPI, UWBC, URBC, UBAC, CAST, UCOM, BILUA  MICROBIOLOGY: No results found for this or any previous visit (from the past 240 hour(s)).  RADIOLOGY STUDIES/RESULTS: Dg Chest 2 View  10/10/2014   CLINICAL DATA:  Extremity swelling for 2 weeks. Shortness of breath. Back pain.  EXAM: CHEST  2 VIEW  COMPARISON:  04/26/2012  FINDINGS: Metallic clip projected over the heart. Normal heart size and pulmonary vascularity. Slightly shallow inspiration. No focal airspace disease or consolidation in the lungs. Persistent patchy infiltration vaguely demonstrated in the right mid lung and left lung base, similar to prior study. This is likely fibrosis. No blunting of costophrenic angles. No pneumothorax.  IMPRESSION: No active cardiopulmonary disease.   Electronically Signed   By: Lucienne Capers M.D.   On: 10/10/2014 22:34   Ct Abdomen Pelvis W Contrast  11/04/2014   CLINICAL DATA:  Nausea, vomiting and generalized abdominal pain for the last few months. Weakness today.  EXAM: CT ABDOMEN AND PELVIS WITH CONTRAST  TECHNIQUE: Multidetector CT imaging of the abdomen and pelvis was performed using the standard protocol following bolus administration of intravenous contrast.  CONTRAST:  100 mL OMNIPAQUE IOHEXOL 300 MG/ML  SOLN  COMPARISON:  CT abdomen and pelvis 01/04/2013.  FINDINGS:  Mild linear atelectasis or scar is seen in the left lung base. A 0.3 cm right middle lobe nodule on image 6 is unchanged. There is no pleural or pericardial effusion. Very small hiatal hernia is identified.  The gallbladder, liver, spleen, adrenal glands and right kidney appear normal. Dilated calyx upper pole left kidney is incidentally noted and unchanged. Scattered aortoiliac atherosclerosis without aneurysm is identified. Uterus, adnexa and urinary bladder appear normal. A few scattered colonic diverticula are identified without evidence of diverticulitis. The stomach, small bowel and appendix appear normal. There is no lymphadenopathy  or fluid.  No lytic or sclerotic bony lesion is identified. There is lumbar spondylosis with trace anterolisthesis L3 on L4 due to facet arthropathy noted. Schmorl's node in the superior endplate of L4 is unchanged.  IMPRESSION: No acute abnormality abdomen or pelvis. No finding to explain the patient's symptoms.  Mild diverticulosis without diverticulitis.  Very small hiatal hernia.  Lumbar spondylosis most notable at L3-4.   Electronically Signed   By: Inge Rise M.D.   On: 11/04/2014 18:16   US Abdomen Limited  11/05/2014   CLINICAL DATA:  Nausea vomiting.  EXAM: US ABDOMEN LIMITED - RIGHT UPPER QUADRANT  COMPARISON:  CT 11/04/2014.  FINDINGS: Gallbladder:  No gallstones or wall thickening visualized. No sonographic Murphy sign noted.  Common bile duct:  Diameter: 4.7 mm.  Liver:  Liver is echogenic consistent with fatty infiltration . No focal abnormality.  IMPRESSION: Fatty infiltration liver otherwise negative exam.   Electronically Signed   By: Marcello Moores  Register   On: 11/05/2014 09:16    Oren Binet, MD  [facility] Pager:[phone]  If 7PM-7AM, please contact night-coverage [email or web address] Password [email or web address] 11/06/2014, 8:54 AM   LOS: 2 days

## 2014-11-06 NOTE — Progress Notes (Signed)
UR completed 

## 2014-11-06 NOTE — Op Note (Signed)
[facility] [address] [place], [id number]   OPERATIVE PROCEDURE REPORT  PATIENT :Brenda Murray, Brenda Murray  MR#: [id number] BIRTHDATE :08/25/1951 GENDER: female ENDOSCOPIST: Lorenza BurtonJyothi N Daejah Klebba, MD ASSISTANT:   Carlos LeveringPatricia Carpenter Ford, RN & Stacie AcresShelby Shoffner, RN   Davida technician PROCEDURE DATE: 11/06/2014 PRE-PROCEDURE PREPERATION: Patient fasted for 4 hours prior to procedure. PRE-PROCEDURE PHYSICAL: Patient has stable vital signs.  Neck is supple.  There is no JVD, thyromegaly or LAD.  Chest clear to auscultation.  S1 and S2 regular.  Abdomen soft, morbidly obese, non-distended, non-tender with NABS. PROCEDURE:     EGD, diagnostic ASA CLASS:     Class II INDICATIONS:     Nausea and vomiting. MEDICATIONS:     Fentanyl 50 mcg and Versed 4 mg IV TOPICAL ANESTHETIC:   Viscous xylocaine-10 cc PO.  DESCRIPTION OF PROCEDURE: After the risks benefits and alternatives of the procedure were thoroughly explained, informed consent was obtained. The Pentax Gastroscope [id number]  was introduced through the mouth and advanced to the second portion of the duodenum , without limitations. The instrument was slowly withdrawn as the mucosa was fully examined.      EXAM: The esophagus and gastroesophageal junction were completely normal in appearance.  The stomach was entered and closely examined.The antrum, angularis, and lesser curvature were well visualized, including a retroflexed view of the cardia and fundus. The stomach wall was normally distensable.  The scope passed easily through the pylorus into the duodenum. s. . The scope was then withdrawn from the patient and the procedure terminated. The patient tolerated the procedure without immediate complications.  IMPRESSION:  Normal appearing esophagus and GE junction, the stomach was well visualized and normal in appearance, normal appearing duodenum RECOMMENDATIONS:     1.  Anti-reflux regimen to be followed. 2.   Continue current medications 3.  Continue PPI 's 4. Schedule a HIDA scan with CCK injeection.  REPEAT EXAM:  for recall: no repeat exam necessary. DISCHARGE INSTRUCTIONS: standard discharge _______________________________ eSignedLorenza Burton:  Shaketha Jeon N Zamya Culhane, MD 11/06/2014 1:10 PM   CPT CODES:     43235@Upper  gastrointestinal endoscopy including esophagus, stomach, and either the duodenum and/or jejunum as appropriate; diagnostic, with or without collection of specimen(s) by brushing or washing (separate procedure) DIAGNOSIS CODES:     R11.0 Nausea R11.10 Vomiting,unspecified  The ICD and CPT codes recommended by this software are interpretations from the data that the clinical staff has captured with the software.  The verification of the translation of this report to the ICD and CPT codes and modifiers is the sole responsibility of the health care institution and practicing physician where this report was generated.  [employer] will not be held responsible for the validity of the ICD and CPT codes included on this report.  AMA assumes no liability for data contained or not contained herein. CPT is a Publishing rights managerregistered trademark of the [employer].   CC: THP  PATIENT NAME:  Brenda Murray, Brenda Murray MR#: [id number]

## 2014-11-07 ENCOUNTER — Encounter (HOSPITAL_COMMUNITY): Payer: Self-pay | Admitting: Gastroenterology

## 2014-11-07 ENCOUNTER — Observation Stay (HOSPITAL_COMMUNITY): Payer: Commercial Managed Care - HMO

## 2014-11-07 DIAGNOSIS — R112 Nausea with vomiting, unspecified: Secondary | ICD-10-CM

## 2014-11-07 LAB — GLUCOSE, CAPILLARY: GLUCOSE-CAPILLARY: 88 mg/dL (ref 70–99)

## 2014-11-07 MED ORDER — TECHNETIUM TC 99M MEBROFENIN IV KIT
5.0000 | PACK | Freq: Once | INTRAVENOUS | Status: AC | PRN
  Administered 2014-11-07: 5 via INTRAVENOUS

## 2014-11-07 MED ORDER — SINCALIDE 5 MCG IJ SOLR
0.0200 ug/kg | Freq: Once | INTRAMUSCULAR | Status: AC
  Administered 2014-11-07: 1.8 ug via INTRAVENOUS
  Filled 2014-11-07: qty 5

## 2014-11-07 MED ORDER — BUMETANIDE 2 MG PO TABS: 1.0000 mg | ORAL_TABLET | Freq: Every day | ORAL | Status: DC | PRN

## 2014-11-07 MED ORDER — STERILE WATER FOR INJECTION IJ SOLN
INTRAMUSCULAR | Status: AC
  Administered 2014-11-07: 10 mL
  Filled 2014-11-07: qty 10

## 2014-11-07 MED ORDER — SINCALIDE 5 MCG IJ SOLR
INTRAMUSCULAR | Status: AC
  Administered 2014-11-07: 1.8 ug via INTRAVENOUS
  Filled 2014-11-07: qty 5

## 2014-11-07 MED ORDER — ONDANSETRON HCL 4 MG PO TABS: 4.0000 mg | ORAL_TABLET | Freq: Four times a day (QID) | ORAL | Status: DC | PRN

## 2014-11-07 NOTE — Progress Notes (Signed)
UR completed 

## 2014-11-07 NOTE — Progress Notes (Signed)
Patient discharge teaching given, including activity, diet, follow-up appoints, and medications. Patient verbalized understanding of all discharge instructions. IV access was d/c'd. Vitals are stable. Skin is intact except as charted in most recent assessments. Pt to be escorted out by NT, to be driven home by family.  Jordyn Doane, MBA, BS, RN 

## 2014-11-07 NOTE — Progress Notes (Signed)
Subjective: Feeling well.  No complaints.  Objective: Vital signs in last 24 hours: Temp:  [97.5 F (36.4 C)-98.3 F (36.8 C)] 98.1 F (36.7 C) (12/17 0448) Pulse Rate:  [46-67] 58 (12/17 0448) Resp:  [13-30] 20 (12/17 0448) BP: (100-163)/(56-90) 106/58 mmHg (12/17 0448) SpO2:  [99 %-100 %] 99 % (12/17 0448) Last BM Date: 11/04/14  Intake/Output from previous day: 12/16 0701 - 12/17 0700 In: 720 [P.O.:720] Out: -  Intake/Output this shift:    General appearance: alert and no distress GI: soft, non-tender; bowel sounds normal; no masses,  no organomegaly  Lab Results:  Recent Labs  11/04/14 1406 11/05/14 0445  WBC 5.4 4.3  HGB 11.8* 10.5*  HCT 35.5* 32.8*  PLT 289 281   BMET  Recent Labs  11/04/14 1406 11/05/14 0445 11/06/14 0542  NA 136* 136* 138  K 2.8* 3.0* 3.8  CL 90* 97 101  CO2 33* 29 27  GLUCOSE 91 85 96  BUN 18 11 8   CREATININE 0.84 0.66 0.74  CALCIUM 10.2 9.4 9.6   LFT  Recent Labs  11/05/14 0445  PROT 7.7  ALBUMIN 3.4*  AST 26  ALT 19  ALKPHOS 62  BILITOT 0.4   PT/INR No results for input(s): LABPROT, INR in the last 72 hours. Hepatitis Panel No results for input(s): HEPBSAG, HCVAB, HEPAIGM, HEPBIGM in the last 72 hours. C-Diff No results for input(s): CDIFFTOX in the last 72 hours. Fecal Lactopherrin No results for input(s): FECLLACTOFRN in the last 72 hours.  Studies/Results: [place] Abdomen Limited  11/05/2014   CLINICAL DATA:  Nausea vomiting.  EXAM: US ABDOMEN LIMITED - RIGHT UPPER QUADRANT  COMPARISON:  CT 11/04/2014.  FINDINGS: Gallbladder:  No gallstones or wall thickening visualized. No sonographic Murphy sign noted.  Common bile duct:  Diameter: 4.7 mm.  Liver:  Liver is echogenic consistent with fatty infiltration . No focal abnormality.  IMPRESSION: Fatty infiltration liver otherwise negative exam.   Electronically Signed   By: Maisie Fushomas  Register   On: 11/05/2014 09:16    Medications:  Scheduled: . ARIPiprazole  10 mg Oral  QHS  . aspirin EC  81 mg Oral Daily  . divalproex  250 mg Oral QHS  . folic acid  1 mg Oral Daily  . heparin  5,000 Units Subcutaneous 3 times per day  . levothyroxine  112 mcg Oral QAC breakfast  . lithium carbonate  300 mg Oral QHS  . multivitamin with minerals  1 tablet Oral Daily  . potassium chloride  40 mEq Oral BID  . sodium chloride  3 mL Intravenous Q12H  . thiamine  100 mg Oral Daily   Continuous: . sodium chloride 10 mL/hr at 11/05/14 1500    Assessment/Plan: 1) Nausea and vomiting.   The patient is well.  No complaints.  No issues with nausea and vomiting at this time.  She is to have her HIDA scan today.    Plan: 1) HIDA scan. 2) Post HIDA scan she can be discharged.   LOS: 3 days   Brenda Murray D 11/07/2014, 7:46 AM

## 2014-11-07 NOTE — Discharge Summary (Signed)
PATIENT DETAILS Name: Brenda Murray Age: 63 y.o. Sex: female Date of Birth: [birth year] MRN: [id number]. Admitting Physician: Allyne Gee, MD YWV:[id number],RSWNI N, MD  Admit Date: 11/04/2014 Discharge date: 11/07/2014  Recommendations for Outpatient Follow-up:  If vomiting recurs-consider outpatient gastric emptying study.  PRIMARY DISCHARGE DIAGNOSIS:  Active Problems:   Nausea and vomiting   Hypothyroid   Metabolic alkalosis   Hypokalemia      PAST MEDICAL HISTORY: Past Medical History  Diagnosis Date  . Anxiety   . Hypothyroidism   . Bipolar disorder   . History of indigestion   . Chronic back pain     DISCHARGE MEDICATIONS: Current Discharge Medication List    START taking these medications   Details  ondansetron (ZOFRAN) 4 MG tablet Take 1 tablet (4 mg total) by mouth every 6 (six) hours as needed for nausea. Qty: 30 tablet, Refills: 0      CONTINUE these medications which have CHANGED   Details  bumetanide (BUMEX) 2 MG tablet Take 0.5 tablets (1 mg total) by mouth daily as needed.      CONTINUE these medications which have NOT CHANGED   Details  ARIPiprazole (ABILIFY) 10 MG tablet Take 10 mg by mouth at bedtime.    divalproex (DEPAKOTE ER) 250 MG 24 hr tablet Take 250 mg by mouth at bedtime.     levothyroxine (SYNTHROID, LEVOTHROID) 112 MCG tablet Take 112 mcg by mouth daily before breakfast.    lithium carbonate 300 MG capsule Take 300 mg by mouth at bedtime.    Omega-3 Fatty Acids (OMEGA-3 FISH OIL PO) Take 1,200 mg by mouth daily.    vitamin B-12 (CYANOCOBALAMIN) 1000 MCG tablet Take 1,000 mcg by mouth daily.      STOP taking these medications     ibuprofen (ADVIL,MOTRIN) 200 MG tablet      furosemide (LASIX) 20 MG tablet      HYDROcodone-acetaminophen (NORCO/VICODIN) 5-325 MG per tablet      methocarbamol (ROBAXIN-750) 750 MG tablet         ALLERGIES:  No Known Allergies  BRIEF HPI:  See H&P, Labs, Consult and Test reports  for all details in brief, patient was admitted for persistent/intractable nausea and vomiting.  CONSULTATIONS:   GI  PERTINENT RADIOLOGIC STUDIES: Dg Chest 2 View  10/10/2014   CLINICAL DATA:  Extremity swelling for 2 weeks. Shortness of breath. Back pain.  EXAM: CHEST  2 VIEW  COMPARISON:  04/26/2012  FINDINGS: Metallic clip projected over the heart. Normal heart size and pulmonary vascularity. Slightly shallow inspiration. No focal airspace disease or consolidation in the lungs. Persistent patchy infiltration vaguely demonstrated in the right mid lung and left lung base, similar to prior study. This is likely fibrosis. No blunting of costophrenic angles. No pneumothorax.  IMPRESSION: No active cardiopulmonary disease.   Electronically Signed   By: Lucienne Capers M.D.   On: 10/10/2014 22:34   Ct Abdomen Pelvis W Contrast  11/04/2014   CLINICAL DATA:  Nausea, vomiting and generalized abdominal pain for the last few months. Weakness today.  EXAM: CT ABDOMEN AND PELVIS WITH CONTRAST  TECHNIQUE: Multidetector CT imaging of the abdomen and pelvis was performed using the standard protocol following bolus administration of intravenous contrast.  CONTRAST:  100 mL OMNIPAQUE IOHEXOL 300 MG/ML  SOLN  COMPARISON:  CT abdomen and pelvis 01/04/2013.  FINDINGS: Mild linear atelectasis or scar is seen in the left lung base. A 0.3 cm right middle lobe nodule on image 6 is  unchanged. There is no pleural or pericardial effusion. Very small hiatal hernia is identified.  The gallbladder, liver, spleen, adrenal glands and right kidney appear normal. Dilated calyx upper pole left kidney is incidentally noted and unchanged. Scattered aortoiliac atherosclerosis without aneurysm is identified. Uterus, adnexa and urinary bladder appear normal. A few scattered colonic diverticula are identified without evidence of diverticulitis. The stomach, small bowel and appendix appear normal. There is no lymphadenopathy or fluid.  No  lytic or sclerotic bony lesion is identified. There is lumbar spondylosis with trace anterolisthesis L3 on L4 due to facet arthropathy noted. Schmorl's node in the superior endplate of L4 is unchanged.  IMPRESSION: No acute abnormality abdomen or pelvis. No finding to explain the patient's symptoms.  Mild diverticulosis without diverticulitis.  Very small hiatal hernia.  Lumbar spondylosis most notable at L3-4.   Electronically Signed   By: Inge Rise M.D.   On: 11/04/2014 18:16   Nm Hepato W/eject Fract  11/07/2014   CLINICAL DATA:  Nausea and vomiting for 1 month. Hypothyroidism. Chronic back pain. Recent EGD.  EXAM: NUCLEAR MEDICINE HEPATOBILIARY IMAGING WITH GALLBLADDER EF  TECHNIQUE: Sequential images of the abdomen were obtained out to 60 minutes following intravenous administration of radiopharmaceutical. After slow intravenous infusion of 1.8 micrograms Cholecystokinin, gallbladder ejection fraction was determined.  RADIOPHARMACEUTICALS:  5.0 [place] LN-98X Choletec  COMPARISON:  CT 11/04/2014  FINDINGS: There is homogeneous radiotracer uptake within the liver. The gallbladder is evident by 22 min. Counts are present within the small bowel by 16 min. Administration of CCK resulted in appropriate contraction of the gallbladder. Gallbladder ejection fraction equals May 6 percent. At 30 min, normal ejection fraction is greater than 30%.  IMPRESSION: 1. Normal gallbladder ejection fraction. 2. Patent cystic duct and common bile duct. 3. Normal exam.   Electronically Signed   By: Suzy Bouchard M.D.   On: 11/07/2014 13:44   US Abdomen Limited  11/05/2014   CLINICAL DATA:  Nausea vomiting.  EXAM: US ABDOMEN LIMITED - RIGHT UPPER QUADRANT  COMPARISON:  CT 11/04/2014.  FINDINGS: Gallbladder:  No gallstones or wall thickening visualized. No sonographic Murphy sign noted.  Common bile duct:  Diameter: 4.7 mm.  Liver:  Liver is echogenic consistent with fatty infiltration . No focal abnormality.   IMPRESSION: Fatty infiltration liver otherwise negative exam.   Electronically Signed   By: [place]   On: 11/05/2014 09:16     PERTINENT LAB RESULTS: CBC:  Recent Labs  11/04/14 1406 11/05/14 0445  WBC 5.4 4.3  HGB 11.8* 10.5*  HCT 35.5* 32.8*  PLT 289 281   CMET CMP     Component Value Date/Time   NA 138 11/06/2014 0542   K 3.8 11/06/2014 0542   CL 101 11/06/2014 0542   CO2 27 11/06/2014 0542   GLUCOSE 96 11/06/2014 0542   BUN 8 11/06/2014 0542   CREATININE 0.74 11/06/2014 0542   CALCIUM 9.6 11/06/2014 0542   PROT 7.7 11/05/2014 0445   ALBUMIN 3.4* 11/05/2014 0445   AST 26 11/05/2014 0445   ALT 19 11/05/2014 0445   ALKPHOS 62 11/05/2014 0445   BILITOT 0.4 11/05/2014 0445   GFRNONAA 89* 11/06/2014 0542   GFRAA >90 11/06/2014 0542    GFR CrCl cannot be calculated (Unknown ideal weight.).  Recent Labs  11/05/14 1453  LIPASE 41   No results for input(s): CKTOTAL, CKMB, CKMBINDEX, TROPONINI in the last 72 hours. Invalid input(s): POCBNP No results for input(s): DDIMER in the last 72 hours.  No results for input(s): HGBA1C in the last 72 hours. No results for input(s): CHOL, HDL, LDLCALC, TRIG, CHOLHDL, LDLDIRECT in the last 72 hours. No results for input(s): TSH, T4TOTAL, T3FREE, THYROIDAB in the last 72 hours.  Invalid input(s): FREET3 No results for input(s): VITAMINB12, FOLATE, FERRITIN, TIBC, IRON, RETICCTPCT in the last 72 hours. Coags: No results for input(s): INR in the last 72 hours.  Invalid input(s): PT Microbiology: No results found for this or any previous visit (from the past 240 hour(s)).   BRIEF HOSPITAL COURSE:  Nausea and vomiting:going on for the past 2 months. CT Abd/Pelvis and RUQ ultrasound on 12/14 negative for acute abnormalities. Lipase levels normal.Etiology remains unknown. GI consulted for EGD, which was negative for acute abnormalities. Subsequently HIDA scan was done which was negative as well. Per GI further workup to  be pursued in the outpatient setting. Thankfully, no further nausea vomiting inpatient. Since stable, workup negative and no further nausea/vomiting, deemed stable for discharge today. Further workup deferred to the outpatient setting.    Hypokalemia:secondary to vomiting,Bumex. Resolved   Met Alkalosis:secondary to vomiting,Bumex.Resolved with hydration and discontinuation of Bumex.   Mild Hyponatremia:secondary to dehydration/vomiting. Resolved with upportive care.   Hypothyroidism:c/w Levothyroxine. TSH normal.   Hx of Bipolar Disorder/Amxiety:On Depakote/Lithium   TODAY-DAY OF DISCHARGE:  Subjective:   Seairra Otani today has no headache,no chest abdominal pain,no new weakness tingling or numbness, feels much better wants to go home today.   Objective:   Blood pressure 114/70, pulse 59, temperature 98.1 F (36.7 C), temperature source Oral, resp. rate 20, weight 89.449 kg (197 lb 3.2 oz), SpO2 100 %.  Intake/Output Summary (Last 24 hours) at 11/07/14 1359 Last data filed at 11/07/14 0933  Gross per 24 hour  Intake    720 ml  Output      0 ml  Net    720 ml   Filed Weights   11/04/14 2203 11/05/14 2046  Weight: 87.136 kg (192 lb 1.6 oz) 89.449 kg (197 lb 3.2 oz)    Exam Awake Alert, Oriented *3, No new F.N deficits, Normal affect [place].AT,PERRAL Supple Neck,No JVD, No cervical lymphadenopathy appriciated.  Symmetrical Chest wall movement, Good air movement bilaterally, CTAB RRR,No Gallops,Rubs or new Murmurs, No Parasternal Heave +ve B.Sounds, Abd Soft, Non tender, No organomegaly appriciated, No rebound -guarding or rigidity. No Cyanosis, Clubbing or edema, No new Rash or bruise  DISCHARGE CONDITION: Stable  DISPOSITION: Home  DISCHARGE INSTRUCTIONS:    Activity:  As tolerated   Diet recommendation: Heart Healthy diet  Discharge Instructions    Call MD for:  persistant nausea and vomiting    Complete by:  As directed      Diet - low sodium heart  healthy    Complete by:  As directed      Increase activity slowly    Complete by:  As directed            Follow-up Information    Follow up with Maximino Greenland, MD. Schedule an appointment as soon as possible for a visit in 1 week.   Specialty:  Internal Medicine   Contact information:   [address] [place] [address] [phone]       Follow up with MANN,JYOTHI, MD. Schedule an appointment as soon as possible for a visit in 2 weeks.   Specialty:  Gastroenterology   Contact information:   [address], [place] [address] [phone]      Total  Time spent on discharge equals 45 minutes.  SignedOren Binet 11/07/2014 1:59 PM

## 2015-01-02 ENCOUNTER — Other Ambulatory Visit: Payer: Self-pay

## 2015-01-02 DIAGNOSIS — Z1231 Encounter for screening mammogram for malignant neoplasm of breast: Secondary | ICD-10-CM

## 2015-01-09 ENCOUNTER — Other Ambulatory Visit: Payer: Self-pay | Admitting: Internal Medicine

## 2015-01-09 ENCOUNTER — Other Ambulatory Visit: Payer: Self-pay

## 2015-01-09 DIAGNOSIS — N63 Unspecified lump in unspecified breast: Secondary | ICD-10-CM

## 2015-01-09 DIAGNOSIS — N644 Mastodynia: Secondary | ICD-10-CM

## 2015-01-16 ENCOUNTER — Ambulatory Visit
Admission: RE | Admit: 2015-01-16 | Discharge: 2015-01-16 | Disposition: A | Discharge status: Home / Self Care | Payer: Medicare Other | Source: Ambulatory Visit | Attending: Internal Medicine | Admitting: Internal Medicine

## 2015-01-16 ENCOUNTER — Other Ambulatory Visit: Payer: Self-pay

## 2015-01-16 DIAGNOSIS — N644 Mastodynia: Secondary | ICD-10-CM

## 2015-01-16 DIAGNOSIS — N63 Unspecified lump in unspecified breast: Secondary | ICD-10-CM

## 2015-01-27 ENCOUNTER — Ambulatory Visit: Payer: Self-pay

## 2015-04-20 ENCOUNTER — Emergency Department (HOSPITAL_COMMUNITY)
Admission: EM | Admit: 2015-04-20 | Discharge: 2015-04-20 | Disposition: A | Discharge status: Home / Self Care | Payer: Medicare Other | Attending: Emergency Medicine | Admitting: Emergency Medicine

## 2015-04-20 ENCOUNTER — Encounter (HOSPITAL_COMMUNITY): Payer: Self-pay | Admitting: Emergency Medicine

## 2015-04-20 DIAGNOSIS — F419 Anxiety disorder, unspecified: Secondary | ICD-10-CM | POA: Insufficient documentation

## 2015-04-20 DIAGNOSIS — E039 Hypothyroidism, unspecified: Secondary | ICD-10-CM | POA: Diagnosis not present

## 2015-04-20 DIAGNOSIS — F319 Bipolar disorder, unspecified: Secondary | ICD-10-CM | POA: Diagnosis not present

## 2015-04-20 DIAGNOSIS — Z8719 Personal history of other diseases of the digestive system: Secondary | ICD-10-CM | POA: Diagnosis not present

## 2015-04-20 DIAGNOSIS — G8929 Other chronic pain: Secondary | ICD-10-CM | POA: Insufficient documentation

## 2015-04-20 DIAGNOSIS — Z79899 Other long term (current) drug therapy: Secondary | ICD-10-CM | POA: Insufficient documentation

## 2015-04-20 DIAGNOSIS — Z87891 Personal history of nicotine dependence: Secondary | ICD-10-CM | POA: Insufficient documentation

## 2015-04-20 DIAGNOSIS — R4182 Altered mental status, unspecified: Secondary | ICD-10-CM | POA: Diagnosis present

## 2015-04-20 DIAGNOSIS — F0390 Unspecified dementia without behavioral disturbance: Secondary | ICD-10-CM | POA: Diagnosis not present

## 2015-04-20 LAB — URINALYSIS, ROUTINE W REFLEX MICROSCOPIC
Bilirubin Urine: NEGATIVE
GLUCOSE, UA: NEGATIVE mg/dL
Hgb urine dipstick: NEGATIVE
Ketones, ur: NEGATIVE mg/dL
Leukocytes, UA: NEGATIVE
Nitrite: NEGATIVE
PROTEIN: NEGATIVE mg/dL
SPECIFIC GRAVITY, URINE: 1.006 (ref 1.005–1.030)
UROBILINOGEN UA: 0.2 mg/dL (ref 0.0–1.0)
pH: 6 (ref 5.0–8.0)

## 2015-04-20 LAB — COMPREHENSIVE METABOLIC PANEL
ALBUMIN: 3.9 g/dL (ref 3.5–5.0)
ALT: 19 U/L (ref 14–54)
AST: 32 U/L (ref 15–41)
Alkaline Phosphatase: 75 U/L (ref 38–126)
Anion gap: 7 (ref 5–15)
BILIRUBIN TOTAL: 0.4 mg/dL (ref 0.3–1.2)
BUN: 10 mg/dL (ref 6–20)
CALCIUM: 9.8 mg/dL (ref 8.9–10.3)
CHLORIDE: 105 mmol/L (ref 101–111)
CO2: 26 mmol/L (ref 22–32)
Creatinine, Ser: 0.93 mg/dL (ref 0.44–1.00)
GFR calc Af Amer: >60 mL/min (ref >60)
Glucose, Bld: 65 mg/dL (ref 65–99)
Potassium: 3.8 mmol/L (ref 3.5–5.1)
Sodium: 138 mmol/L (ref 135–145)
Total Protein: 8.1 g/dL (ref 6.5–8.1)

## 2015-04-20 LAB — CBC WITH DIFFERENTIAL/PLATELET
BASOS ABS: 0 10*3/uL (ref 0.0–0.1)
BASOS PCT: 0 % (ref 0–1)
EOS PCT: 2 % (ref 0–5)
Eosinophils Absolute: 0.1 10*3/uL (ref 0.0–0.7)
HCT: 36.2 % (ref 36.0–46.0)
Hemoglobin: 11.4 g/dL — ABNORMAL LOW (ref 12.0–15.0)
Lymphocytes Relative: 28 % (ref 12–46)
Lymphs Abs: 1.3 10*3/uL (ref 0.7–4.0)
MCH: 25.9 pg — AB (ref 26.0–34.0)
MCHC: 31.5 g/dL (ref 30.0–36.0)
MCV: 82.1 fL (ref 78.0–100.0)
Monocytes Absolute: 0.5 10*3/uL (ref 0.1–1.0)
Monocytes Relative: 10 % (ref 3–12)
Neutro Abs: 2.9 10*3/uL (ref 1.7–7.7)
Neutrophils Relative %: 60 % (ref 43–77)
Platelets: 223 10*3/uL (ref 150–400)
RBC: 4.41 MIL/uL (ref 3.87–5.11)
RDW: 14.6 % (ref 11.5–15.5)
WBC: 4.8 10*3/uL (ref 4.0–10.5)

## 2015-04-20 LAB — VALPROIC ACID LEVEL: Valproic Acid Lvl: 42 ug/mL — ABNORMAL LOW (ref 50.0–100.0)

## 2015-04-20 LAB — LITHIUM LEVEL: Lithium Lvl: 0.39 mmol/L — ABNORMAL LOW (ref 0.60–1.20)

## 2015-04-20 LAB — SALICYLATE LEVEL

## 2015-04-20 LAB — ACETAMINOPHEN LEVEL

## 2015-04-20 LAB — CBG MONITORING, ED: GLUCOSE-CAPILLARY: 73 mg/dL (ref 65–99)

## 2015-04-20 MED ORDER — LORAZEPAM 0.5 MG PO TABS: 0.5000 mg | ORAL_TABLET | Freq: Four times a day (QID) | ORAL | Status: DC | PRN

## 2015-04-20 NOTE — ED Provider Notes (Signed)
CSN: [id number]     Arrival date & time 04/20/15  1630 History   First MD Initiated Contact with Patient 04/20/15 1826     Chief Complaint  Patient presents with  . Altered Mental Status     (Consider location/radiation/quality/duration/timing/severity/associated sxs/prior Treatment) HPI   Brenda Murray is a 64 year old woman with past medical history of bipolar anxiety and chronic pain, who is brought to the emergency department by her son for 1 month of declining mental status. Over the last month she has had several medication changes and is currently taking lithium, Depakote, Restoril, and has had recent anabiotic prescribed for acute otitis media, which is now resolved. She has had increasing incidence of acute agitated states, where she is seeing lights, and feels fearful. This has happened multiple times in the evening at her home, when she was trying to go to bed.  She lives alone in a room in an assisted living facility.  She regularly sees a psychiatrist, and went with her son four days ago.  She was prescribed restoril to take at night to help with these episodes, and to help her sleep and she also was told to schedule therapy and activities.  New medication has not seemed to decrease the number or the severity of these episodes.  She has not gone to any additional therapy. The son is concerned about the levels of the patient's medications, or possible interactions with all the medicine and recently prescribed meds.  He has noticed she is more anxious and withdrawn.  For example when they go to church, friends will approach his mother and asked her if she is doing okay, and this irritates her and increases her aggitation.  He tries to visit her often, schedule activities for her, and takes her to church.  She denies any head ache, loss of consciousness, or dizziness.  She does not have any chest pain, pressure or shortness of breath.  She has no other complaints other than her chronic back pain,  and occasionally feeling scared at night when she is trying to go to bed.   Without the son present, the pt confused is not confused, denies SI, HI, but endorses some depression and anxiety that has been stable over the past two year.  She does not like how she does not feel like herself while on her psych medications, and that makes her feel like people are talking about her, so she does not like to go out in that state.  She does acknowledge that the medications have helped her to avoid admissions for mental health.    Past Medical History  Diagnosis Date  . Anxiety   . Hypothyroidism   . Bipolar disorder   . History of indigestion   . Chronic back pain    Past Surgical History  Procedure Laterality Date  . Joint replacement      bilat knee  . Carpel tunnel    . Tonsillectomy    . Total knee revision  05/01/2012    Procedure: TOTAL KNEE REVISION;  Surgeon: Raymon MuttonStephen D Lucey, MD;  [facility];  Service: Orthopedics;  Laterality: Right;  . Esophagogastroduodenoscopy N/A 11/06/2014    Procedure: ESOPHAGOGASTRODUODENOSCOPY (EGD);  Surgeon: Charna ElizabethJyothi Mann, MD;  [facility];  Service: Endoscopy;  Laterality: N/A;   No family history on file. History  Substance Use Topics  . Smoking status: Former Games developermoker  . Smokeless tobacco: Not on file  . Alcohol Use: No   OB History  No data available     Review of Systems  All other systems reviewed and are negative.     Allergies  Review of patient's allergies indicates no known allergies.  Home Medications   Prior to Admission medications   Medication Sig Start Date End Date Taking? Authorizing Provider  benztropine (COGENTIN) 0.5 MG tablet Take 0.5 mg by mouth at bedtime.   Yes Historical Provider, MD  calcium citrate (CALCITRATE - DOSED IN MG ELEMENTAL CALCIUM) 950 MG tablet Take 200 mg of elemental calcium by mouth daily.   Yes Historical Provider, MD  Cholecalciferol (VITAMIN D) 2000 UNITS CAPS Take 1 capsule by mouth  daily.   Yes Historical Provider, MD  divalproex (DEPAKOTE ER) 250 MG 24 hr tablet Take 750 mg by mouth at bedtime.    Yes Historical Provider, MD  ibuprofen (ADVIL,MOTRIN) 200 MG tablet Take 400 mg by mouth every 6 (six) hours as needed for moderate pain.   Yes Historical Provider, MD  ketotifen (ZADITOR) 0.025 % ophthalmic solution Place 1 drop into both eyes 2 (two) times daily.   Yes Historical Provider, MD  levothyroxine (SYNTHROID, LEVOTHROID) 112 MCG tablet Take 112 mcg by mouth daily before breakfast.   Yes Historical Provider, MD  lithium carbonate 300 MG capsule Take 300 mg by mouth at bedtime.   Yes Historical Provider, MD  ondansetron (ZOFRAN) 4 MG tablet Take 1 tablet (4 mg total) by mouth every 6 (six) hours as needed for nausea. 11/07/14  Yes Shanker Levora Dredge, MD  temazepam (RESTORIL) 30 MG capsule Take 30 mg by mouth at bedtime.   Yes Historical Provider, MD  traMADol (ULTRAM) 50 MG tablet Take 1-2 mg by mouth every 6 (six) hours as needed for moderate pain.   Yes Historical Provider, MD  bumetanide (BUMEX) 2 MG tablet Take 0.5 tablets (1 mg total) by mouth daily as needed. Patient not taking: Reported on 04/20/2015 11/07/14   Maretta Bees, MD   BP 115/72 mmHg  Pulse 63  Temp(Src) 98.3 F (36.8 C) (Oral)  Resp 18  SpO2 98% Physical Exam  Constitutional: She is oriented to person, place, and time. She appears well-developed and well-nourished. No distress.  HENT:  Head: Normocephalic and atraumatic.  Nose: Nose normal.  Mouth/Throat: Oropharynx is clear and moist. No oropharyngeal exudate.  Eyes: Conjunctivae and EOM are normal. Pupils are equal, round, and reactive to light. Right eye exhibits no discharge. Left eye exhibits no discharge. No scleral icterus.  Neck: Normal range of motion. No JVD present. No tracheal deviation present. No thyromegaly present.  Cardiovascular: Normal rate, regular rhythm, normal heart sounds and intact distal pulses.  Exam reveals no  gallop and no friction rub.   No murmur heard. Pulmonary/Chest: Effort normal and breath sounds normal. No respiratory distress. She has no wheezes. She has no rales. She exhibits no tenderness.  Abdominal: Soft. Bowel sounds are normal. She exhibits no distension and no mass. There is no tenderness. There is no rebound and no guarding.  Lymphadenopathy:    She has no cervical adenopathy.  Neurological: She is alert and oriented to person, place, and time. No cranial nerve deficit. She exhibits normal muscle tone. Coordination normal.  Skin: Skin is warm and dry. No rash noted. No erythema. No pallor.  Psychiatric: Her speech is normal. Judgment and thought content normal. Her mood appears anxious. Her affect is not angry, not blunt, not labile and not inappropriate. She is slowed. She is not aggressive, not hyperactive, not withdrawn  and not combative. Thought content is not paranoid and not delusional. She does not express impulsivity or inappropriate judgment. She expresses no homicidal and no suicidal ideation. She expresses no suicidal plans and no homicidal plans.  Nursing note and vitals reviewed.   ED Course  Procedures (including critical care time) Labs Review Labs Reviewed  LITHIUM LEVEL - Abnormal; Notable for the following:    Lithium Lvl 0.39 (*)    All other components within normal limits  CBC WITH DIFFERENTIAL/PLATELET - Abnormal; Notable for the following:    Hemoglobin 11.4 (*)    MCH 25.9 (*)    All other components within normal limits  URINALYSIS, ROUTINE W REFLEX MICROSCOPIC (NOT AT [facility])  COMPREHENSIVE METABOLIC PANEL  VALPROIC ACID LEVEL  ACETAMINOPHEN LEVEL  SALICYLATE LEVEL  CBG MONITORING, ED    Imaging Review No results found.   EKG Interpretation None      MDM   Final diagnoses:  Dementia, without behavioral disturbance    Patient is alert and oriented 3, is answering questions appropriately and following commands without difficulty.  Vital  signs have been reviewed and all are within normal limits.    I will check her lithium and Depakote levels, get a urinalysis and EKG as well as basic labs.  At this time I do not suspect any acute delirious state, or metabolic encephalopathy.  The patient is not agitated, combative, or euphoric.  I do not suspect this is a psychotic break or any complication of bipolar.  Most likely, I suspect this is some early dementia versus sundowning syndrome.  I'll make sure to rule out any toxicities or infection.  All results were reviewed with the patient and her son. Advised close follow-up with their already established psychiatric care for her concerns of increased anxiety and depression.  Ativan was prescribed for any acute agitated episodes. Per Dr. Estell Harpin, pt was told to discontinue restoril.  Vitals were reviewed, I do not believe she is a danger to herself or others and does not need a psych consult tonight.  She will discharge home with her son, with close follow-up.           Danelle Berry, PA-C 04/22/15 0132  Danelle Berry, PA-C 04/22/15 0134  Bethann Berkshire, MD 04/24/15 [phone]

## 2015-04-20 NOTE — ED Notes (Signed)
Pt from home with son c/o AMS. Pt son sts that pt has been declining in mental status x1 month. Pt takes lithium and thinks that "her levels may be off". Pt son adds that pt also has been rx'd Restoril for sleep x1 week. Pt has chronic knee pain from replacements and is also concerned that pain meds may be the cause. Pt son wants meds evaluated for compatibility. Pt is A&O x4 and in NAD. VSS

## 2015-04-20 NOTE — Discharge Instructions (Signed)

## 2015-04-30 DIAGNOSIS — M545 Low back pain: Secondary | ICD-10-CM | POA: Diagnosis not present

## 2015-04-30 DIAGNOSIS — M25551 Pain in right hip: Secondary | ICD-10-CM | POA: Diagnosis not present

## 2015-04-30 DIAGNOSIS — M25561 Pain in right knee: Secondary | ICD-10-CM | POA: Diagnosis not present

## 2015-04-30 DIAGNOSIS — M461 Sacroiliitis, not elsewhere classified: Secondary | ICD-10-CM | POA: Diagnosis not present

## 2015-04-30 DIAGNOSIS — M5417 Radiculopathy, lumbosacral region: Secondary | ICD-10-CM | POA: Diagnosis not present

## 2015-04-30 DIAGNOSIS — M791 Myalgia: Secondary | ICD-10-CM | POA: Diagnosis not present

## 2015-05-05 DIAGNOSIS — B379 Candidiasis, unspecified: Secondary | ICD-10-CM | POA: Diagnosis not present

## 2015-05-07 DIAGNOSIS — M545 Low back pain: Secondary | ICD-10-CM | POA: Diagnosis not present

## 2015-05-07 DIAGNOSIS — M791 Myalgia: Secondary | ICD-10-CM | POA: Diagnosis not present

## 2015-05-07 DIAGNOSIS — M5417 Radiculopathy, lumbosacral region: Secondary | ICD-10-CM | POA: Diagnosis not present

## 2015-05-07 DIAGNOSIS — M461 Sacroiliitis, not elsewhere classified: Secondary | ICD-10-CM | POA: Diagnosis not present

## 2015-05-09 DIAGNOSIS — M5417 Radiculopathy, lumbosacral region: Secondary | ICD-10-CM | POA: Diagnosis not present

## 2015-05-09 DIAGNOSIS — M545 Low back pain: Secondary | ICD-10-CM | POA: Diagnosis not present

## 2015-05-09 DIAGNOSIS — M791 Myalgia: Secondary | ICD-10-CM | POA: Diagnosis not present

## 2015-05-12 DIAGNOSIS — M5137 Other intervertebral disc degeneration, lumbosacral region: Secondary | ICD-10-CM | POA: Diagnosis not present

## 2015-05-12 DIAGNOSIS — M5417 Radiculopathy, lumbosacral region: Secondary | ICD-10-CM | POA: Diagnosis not present

## 2015-05-12 DIAGNOSIS — M461 Sacroiliitis, not elsewhere classified: Secondary | ICD-10-CM | POA: Diagnosis not present

## 2015-05-12 DIAGNOSIS — M545 Low back pain: Secondary | ICD-10-CM | POA: Diagnosis not present

## 2015-05-12 DIAGNOSIS — M791 Myalgia: Secondary | ICD-10-CM | POA: Diagnosis not present

## 2015-05-14 DIAGNOSIS — M545 Low back pain: Secondary | ICD-10-CM | POA: Diagnosis not present

## 2015-05-14 DIAGNOSIS — M5417 Radiculopathy, lumbosacral region: Secondary | ICD-10-CM | POA: Diagnosis not present

## 2015-05-14 DIAGNOSIS — M791 Myalgia: Secondary | ICD-10-CM | POA: Diagnosis not present

## 2015-05-14 DIAGNOSIS — M461 Sacroiliitis, not elsewhere classified: Secondary | ICD-10-CM | POA: Diagnosis not present

## 2015-05-19 DIAGNOSIS — M791 Myalgia: Secondary | ICD-10-CM | POA: Diagnosis not present

## 2015-05-19 DIAGNOSIS — M25551 Pain in right hip: Secondary | ICD-10-CM | POA: Diagnosis not present

## 2015-05-19 DIAGNOSIS — M545 Low back pain: Secondary | ICD-10-CM | POA: Diagnosis not present

## 2015-05-19 DIAGNOSIS — M461 Sacroiliitis, not elsewhere classified: Secondary | ICD-10-CM | POA: Diagnosis not present

## 2015-05-19 DIAGNOSIS — M5417 Radiculopathy, lumbosacral region: Secondary | ICD-10-CM | POA: Diagnosis not present

## 2015-05-21 DIAGNOSIS — M5417 Radiculopathy, lumbosacral region: Secondary | ICD-10-CM | POA: Diagnosis not present

## 2015-05-21 DIAGNOSIS — M791 Myalgia: Secondary | ICD-10-CM | POA: Diagnosis not present

## 2015-05-21 DIAGNOSIS — M461 Sacroiliitis, not elsewhere classified: Secondary | ICD-10-CM | POA: Diagnosis not present

## 2015-05-21 DIAGNOSIS — M545 Low back pain: Secondary | ICD-10-CM | POA: Diagnosis not present

## 2015-05-28 DIAGNOSIS — M5417 Radiculopathy, lumbosacral region: Secondary | ICD-10-CM | POA: Diagnosis not present

## 2015-05-28 DIAGNOSIS — M461 Sacroiliitis, not elsewhere classified: Secondary | ICD-10-CM | POA: Diagnosis not present

## 2015-05-28 DIAGNOSIS — M545 Low back pain: Secondary | ICD-10-CM | POA: Diagnosis not present

## 2015-05-28 DIAGNOSIS — M5137 Other intervertebral disc degeneration, lumbosacral region: Secondary | ICD-10-CM | POA: Diagnosis not present

## 2015-05-30 DIAGNOSIS — M25561 Pain in right knee: Secondary | ICD-10-CM | POA: Diagnosis not present

## 2015-05-30 DIAGNOSIS — M5417 Radiculopathy, lumbosacral region: Secondary | ICD-10-CM | POA: Diagnosis not present

## 2015-05-30 DIAGNOSIS — M461 Sacroiliitis, not elsewhere classified: Secondary | ICD-10-CM | POA: Diagnosis not present

## 2015-05-30 DIAGNOSIS — M545 Low back pain: Secondary | ICD-10-CM | POA: Diagnosis not present

## 2015-05-30 DIAGNOSIS — M25551 Pain in right hip: Secondary | ICD-10-CM | POA: Diagnosis not present

## 2015-05-30 DIAGNOSIS — M791 Myalgia: Secondary | ICD-10-CM | POA: Diagnosis not present

## 2015-06-02 DIAGNOSIS — M545 Low back pain: Secondary | ICD-10-CM | POA: Diagnosis not present

## 2015-06-02 DIAGNOSIS — M5127 Other intervertebral disc displacement, lumbosacral region: Secondary | ICD-10-CM | POA: Diagnosis not present

## 2015-06-02 DIAGNOSIS — M791 Myalgia: Secondary | ICD-10-CM | POA: Diagnosis not present

## 2015-06-03 ENCOUNTER — Emergency Department (HOSPITAL_COMMUNITY)
Admission: EM | Admit: 2015-06-03 | Discharge: 2015-06-04 | Payer: Medicare Other | Attending: Emergency Medicine | Admitting: Emergency Medicine

## 2015-06-03 ENCOUNTER — Ambulatory Visit (HOSPITAL_COMMUNITY)
Admission: RE | Admit: 2015-06-03 | Discharge: 2015-06-03 | Disposition: A | Discharge status: Home / Self Care | Payer: Medicare Other | Attending: Psychiatry | Admitting: Psychiatry

## 2015-06-03 ENCOUNTER — Encounter (HOSPITAL_COMMUNITY): Payer: Self-pay | Admitting: Emergency Medicine

## 2015-06-03 DIAGNOSIS — R630 Anorexia: Secondary | ICD-10-CM | POA: Insufficient documentation

## 2015-06-03 DIAGNOSIS — Z79899 Other long term (current) drug therapy: Secondary | ICD-10-CM | POA: Insufficient documentation

## 2015-06-03 DIAGNOSIS — F32A Depression, unspecified: Secondary | ICD-10-CM

## 2015-06-03 DIAGNOSIS — Z008 Encounter for other general examination: Secondary | ICD-10-CM

## 2015-06-03 DIAGNOSIS — G8929 Other chronic pain: Secondary | ICD-10-CM | POA: Diagnosis not present

## 2015-06-03 DIAGNOSIS — F329 Major depressive disorder, single episode, unspecified: Secondary | ICD-10-CM | POA: Diagnosis not present

## 2015-06-03 DIAGNOSIS — E039 Hypothyroidism, unspecified: Secondary | ICD-10-CM | POA: Diagnosis not present

## 2015-06-03 DIAGNOSIS — Z046 Encounter for general psychiatric examination, requested by authority: Secondary | ICD-10-CM | POA: Diagnosis not present

## 2015-06-03 DIAGNOSIS — R197 Diarrhea, unspecified: Secondary | ICD-10-CM | POA: Diagnosis not present

## 2015-06-03 DIAGNOSIS — F419 Anxiety disorder, unspecified: Secondary | ICD-10-CM | POA: Insufficient documentation

## 2015-06-03 DIAGNOSIS — R443 Hallucinations, unspecified: Secondary | ICD-10-CM

## 2015-06-03 LAB — CBC WITH DIFFERENTIAL/PLATELET
BASOS PCT: 0 % (ref 0–1)
Basophils Absolute: 0 10*3/uL (ref 0.0–0.1)
EOS ABS: 0.1 10*3/uL (ref 0.0–0.7)
Eosinophils Relative: 2 % (ref 0–5)
HEMATOCRIT: 37.3 % (ref 36.0–46.0)
HEMOGLOBIN: 11.9 g/dL — AB (ref 12.0–15.0)
LYMPHS PCT: 40 % (ref 12–46)
Lymphs Abs: 1.7 10*3/uL (ref 0.7–4.0)
MCH: 26 pg (ref 26.0–34.0)
MCHC: 31.9 g/dL (ref 30.0–36.0)
MCV: 81.4 fL (ref 78.0–100.0)
Monocytes Absolute: 0.5 10*3/uL (ref 0.1–1.0)
Monocytes Relative: 11 % (ref 3–12)
NEUTROS ABS: 2.1 10*3/uL (ref 1.7–7.7)
Neutrophils Relative %: 47 % (ref 43–77)
Platelets: 262 10*3/uL (ref 150–400)
RBC: 4.58 MIL/uL (ref 3.87–5.11)
RDW: 14.7 % (ref 11.5–15.5)
WBC: 4.4 10*3/uL (ref 4.0–10.5)

## 2015-06-03 LAB — COMPREHENSIVE METABOLIC PANEL
ALBUMIN: 4.3 g/dL (ref 3.5–5.0)
ALK PHOS: 91 U/L (ref 38–126)
ALT: 21 U/L (ref 14–54)
AST: 27 U/L (ref 15–41)
Anion gap: 9 (ref 5–15)
BUN: 9 mg/dL (ref 6–20)
CALCIUM: 10.1 mg/dL (ref 8.9–10.3)
CO2: 26 mmol/L (ref 22–32)
Chloride: 102 mmol/L (ref 101–111)
Creatinine, Ser: 0.83 mg/dL (ref 0.44–1.00)
GFR calc Af Amer: >60 mL/min (ref >60)
GLUCOSE: 72 mg/dL (ref 65–99)
Potassium: 3.8 mmol/L (ref 3.5–5.1)
Sodium: 137 mmol/L (ref 135–145)
TOTAL PROTEIN: 9 g/dL — AB (ref 6.5–8.1)
Total Bilirubin: 0.4 mg/dL (ref 0.3–1.2)

## 2015-06-03 LAB — VALPROIC ACID LEVEL: Valproic Acid Lvl: 39 ug/mL — ABNORMAL LOW (ref 50.0–100.0)

## 2015-06-03 LAB — RAPID URINE DRUG SCREEN, HOSP PERFORMED
AMPHETAMINES: NOT DETECTED
Barbiturates: NOT DETECTED
Benzodiazepines: NOT DETECTED
Cocaine: NOT DETECTED
Opiates: NOT DETECTED
Tetrahydrocannabinol: NOT DETECTED

## 2015-06-03 LAB — URINALYSIS, ROUTINE W REFLEX MICROSCOPIC
BILIRUBIN URINE: NEGATIVE
GLUCOSE, UA: NEGATIVE mg/dL
HGB URINE DIPSTICK: NEGATIVE
Ketones, ur: 15 mg/dL — AB
LEUKOCYTES UA: NEGATIVE
NITRITE: NEGATIVE
Protein, ur: NEGATIVE mg/dL
Specific Gravity, Urine: 1.007 (ref 1.005–1.030)
UROBILINOGEN UA: 0.2 mg/dL (ref 0.0–1.0)
pH: 6 (ref 5.0–8.0)

## 2015-06-03 LAB — LITHIUM LEVEL: Lithium Lvl: 0.32 mmol/L — ABNORMAL LOW (ref 0.60–1.20)

## 2015-06-03 LAB — AMMONIA: Ammonia: 15 umol/L (ref 9–35)

## 2015-06-03 LAB — ETHANOL: Alcohol, Ethyl (B): <5 mg/dL (ref <5)

## 2015-06-03 LAB — TSH: TSH: 3.897 u[IU]/mL (ref 0.350–4.500)

## 2015-06-03 MED ORDER — LITHIUM CARBONATE 300 MG PO CAPS
300.0000 mg | ORAL_CAPSULE | Freq: Every day | ORAL | Status: DC
  Administered 2015-06-03: 300 mg via ORAL
  Filled 2015-06-03: qty 1

## 2015-06-03 MED ORDER — LORAZEPAM 0.5 MG PO TABS
0.5000 mg | ORAL_TABLET | Freq: Four times a day (QID) | ORAL | Status: DC | PRN
  Administered 2015-06-03: 0.5 mg via ORAL
  Filled 2015-06-03: qty 1

## 2015-06-03 MED ORDER — CALCIUM CITRATE 950 (200 CA) MG PO TABS
200.0000 mg | ORAL_TABLET | Freq: Every day | ORAL | Status: DC
  Administered 2015-06-03 – 2015-06-04 (×2): 200 mg via ORAL
  Filled 2015-06-03 (×2): qty 1

## 2015-06-03 MED ORDER — DIVALPROEX SODIUM ER 500 MG PO TB24
750.0000 mg | ORAL_TABLET | Freq: Every day | ORAL | Status: DC
  Administered 2015-06-03: 750 mg via ORAL
  Filled 2015-06-03 (×2): qty 1

## 2015-06-03 MED ORDER — LEVOTHYROXINE SODIUM 112 MCG PO TABS
112.0000 ug | ORAL_TABLET | Freq: Every day | ORAL | Status: DC
  Administered 2015-06-04: 112 ug via ORAL
  Filled 2015-06-03 (×2): qty 1

## 2015-06-03 MED ORDER — KETOTIFEN FUMARATE 0.025 % OP SOLN
1.0000 [drp] | Freq: Two times a day (BID) | OPHTHALMIC | Status: DC
  Administered 2015-06-03 – 2015-06-04 (×2): 1 [drp] via OPHTHALMIC
  Filled 2015-06-03: qty 5

## 2015-06-03 NOTE — Progress Notes (Signed)
Patient was referred for IP treatment to the following facilities: [facility], [facility], [facility], [facility], [facility], and [facility].  Mel[place] Abtsatia Colleena Kurtenbach, LCSWA Disposition staff 06/03/2015 11:15 PM

## 2015-06-03 NOTE — ED Notes (Signed)
Per family member, was placed on Zoloft and has been taking it for 3 days-states now she is paranoid, having diarrhea, incoherent at times since starting meds

## 2015-06-03 NOTE — BH Assessment (Signed)
Per Renata Capriceonrad, DNP - patient meets inpatient criteria provided that there is no acute organic etiology.

## 2015-06-03 NOTE — BH Assessment (Signed)
[facility].  Patient is a 64 year old African American female that reports confusion and increased depression and anxiety.  Per patients son, patient has not been herself and seems to talking to people that are not in the room.  Patient denies SI/HI/Psychosis/Substance Abuse.  Patient denies prior psychiatric hospitalization.  Patient receives medication management and outpatient mental health therapy from [facility] [phone].    Writer informed the Charge Nurse Candise Bowens(Jen) that the [facility].   Per Renata Capriceonrad, DNP - patient meets inpatient criteria provided that there is no acute organic etiology.

## 2015-06-03 NOTE — BH Assessment (Addendum)
Assessment Note  Brenda Murray is an 64 y.o. African American female that presented to [facility] as a walk in.  Patient  reports confusion and increased depression and anxiety. Per patients son, patient has not been herself and seems to talking to people that are not in the room.  Patient was orientated to person, place, time and situation.  During the assessment the patient reports that she has not been able to sleep for several days.  Patient reports that she lives in a retirement home and hears voices of the residents all of the time.  Patient denies SI/HI/Psychosis/Substance Abuse.   Patient reports feeling depressed because she does not want to be a burden on her son because he has a wife and family that he needs to be with.  Patient denies prior psychiatric hospitalization. Patient receives medication management and outpatient mental health therapy from [facility] [phone]. Per patients son these type of crying spells and her inability to sleep stated when her medication was changed at [facility].   Per, patients son the patient does not have a history of Dementia or Alzheimer's.   Per patient and her son they reports that her medication needs to be changed because it is not working.   Per Renata Caprice, DNP - patient meets inpatient criteria provided that there is no acute organic etiology.    Axis I: Major Depression, single episode Bipolar Disorder  Axis II: Deferred Axis III:  Past Medical History  Diagnosis Date  . Anxiety   . Hypothyroidism   . Bipolar disorder   . History of indigestion   . Chronic back pain    Axis IV: other psychosocial or environmental problems, problems related to social environment and problems with access to health care services Axis V: 41-50 serious symptoms  Past Medical History:  Past Medical History  Diagnosis Date  . Anxiety   . Hypothyroidism   . Bipolar disorder   . History of indigestion   . Chronic back pain     Past Surgical  History  Procedure Laterality Date  . Joint replacement      bilat knee  . Carpel tunnel    . Tonsillectomy    . Total knee revision  05/01/2012    Procedure: TOTAL KNEE REVISION;  Surgeon: Raymon Mutton, MD;  Location: [facility] OR;  Service: Orthopedics;  Laterality: Right;  . Esophagogastroduodenoscopy N/A 11/06/2014    Procedure: ESOPHAGOGASTRODUODENOSCOPY (EGD);  Surgeon: Charna Elizabeth, MD;  Location: [facility] ENDOSCOPY;  Service: Endoscopy;  Laterality: N/A;    Family History: No family history on file.  Social History:  reports that she has quit smoking. She does not have any smokeless tobacco history on file. She reports that she does not drink alcohol or use illicit drugs.  Additional Social History:  Alcohol / Drug Use History of alcohol / drug use?: No history of alcohol / drug abuse  CIWA:   COWS:    Allergies: No Known Allergies  Home Medications:  (Not in a hospital admission)  OB/GYN Status:  No LMP recorded. Patient is postmenopausal.  General Assessment Data Location of Assessment: [facility] Services (Walk In ) TTS Assessment: In system Is this a Tele or Face-to-Face Assessment?: Face-to-Face Is this an Initial Assessment or a Re-assessment for this encounter?: Initial Assessment Marital status: Single Maiden name: None Reported Is patient pregnant?: No Pregnancy Status: No Living Arrangements: Alone Can pt return to current living arrangement?: Yes Admission Status: Voluntary Is patient capable of signing voluntary admission?:  Yes Referral Source: Self/Family/Friend Insurance type: UHC  Medical Screening Exam [facility] Walk-in ONLY) Medical Exam completed: Yes  Crisis Care Plan Living Arrangements: Alone Name of Psychiatrist: Dr. Misty StanleyLisa Polous  Name of Therapist: Eldridge DaceJoan Fraifeld, LCSW  Education Status Is patient currently in school?: No Current Grade: NA Highest grade of school patient has completed: NA Name of school: NA Contact person: NA  Risk to self  with the past 6 months Suicidal Ideation: No Has patient been a risk to self within the past 6 months prior to admission? : No Suicidal Intent: No Has patient had any suicidal intent within the past 6 months prior to admission? : No Is patient at risk for suicide?: No Suicidal Plan?: No Has patient had any suicidal plan within the past 6 months prior to admission? : No Access to Means: No What has been your use of drugs/alcohol within the last 12 months?: NA Previous Attempts/Gestures: No How many times?: 0 Other Self Harm Risks: NA Triggers for Past Attempts:  (NA) Intentional Self Injurious Behavior: None Family Suicide History: No Recent stressful life event(s): Other (Comment) (Fixiating on past events in her life) Persecutory voices/beliefs?: No Depression: Yes Depression Symptoms: Despondent, Insomnia, Tearfulness, Fatigue, Feeling worthless/self pity, Loss of interest in usual pleasures, Guilt Substance abuse history and/or treatment for substance abuse?: No Suicide prevention information given to non-admitted patients: Not applicable  Risk to Others within the past 6 months Homicidal Ideation: No Does patient have any lifetime risk of violence toward others beyond the six months prior to admission? : No Thoughts of Harm to Others: No Current Homicidal Intent: No Current Homicidal Plan: No Access to Homicidal Means: No Identified Victim: None Reported History of harm to others?: No Assessment of Violence: None Noted Violent Behavior Description: None Reported Does patient have access to weapons?: No Criminal Charges Pending?: No Does patient have a court date: No Is patient on probation?: No  Psychosis Hallucinations: None noted Delusions: None noted  Mental Status Report Appearance/Hygiene: Disheveled Eye Contact: Fair Motor Activity: Freedom of movement, Restlessness Speech: Logical/coherent, Pressured, Slow, Soft Level of Consciousness: Alert, Restless,  Irritable Mood: Depressed, Anxious, Suspicious, Fearful, Helpless, Worthless, low self-esteem Affect: Anxious, Depressed Anxiety Level: Minimal Thought Processes: Coherent, Relevant Judgement: Unimpaired Orientation: Person, Place, Time, Situation Obsessive Compulsive Thoughts/Behaviors: None  Cognitive Functioning Concentration: Decreased Memory: Recent Intact, Remote Intact IQ: Average Insight: Fair Impulse Control: Fair Appetite: Fair Weight Loss: 0 Weight Gain: 0 Sleep: Decreased Total Hours of Sleep: 3 (Cannot sleep through the night.) Vegetative Symptoms: Decreased grooming, Staying in bed  ADLScreening [facility] Assessment Services) Patient's cognitive ability adequate to safely complete daily activities?: Yes Patient able to express need for assistance with ADLs?: No Independently performs ADLs?: Yes (appropriate for developmental age)  Prior Inpatient Therapy Prior Inpatient Therapy: No Prior Therapy Dates: NA Prior Therapy Facilty/Provider(s): NA Reason for Treatment: NA  Prior Outpatient Therapy Prior Outpatient Therapy: Yes Prior Therapy Dates: Ongoing  Prior Therapy Facilty/Provider(s): [facility]  Reason for Treatment: Medication Management and Outpatient Therapy Does patient have an ACCT team?: No Does patient have Intensive In-House Services?  : No Does patient have [facility] services? : No Does patient have P4CC services?: No  ADL Screening (condition at time of admission) Patient's cognitive ability adequate to safely complete daily activities?: Yes Is the patient deaf or have difficulty hearing?: No Does the patient have difficulty seeing, even when wearing glasses/contacts?: No Does the patient have difficulty concentrating, remembering, or making decisions?: No Patient able to  express need for assistance with ADLs?: No Does the patient have difficulty dressing or bathing?: No Independently performs ADLs?: Yes (appropriate for  developmental age) Does the patient have difficulty walking or climbing stairs?: No Weakness of Legs: None Weakness of Arms/Hands: None  Home Assistive Devices/Equipment Home Assistive Devices/Equipment: None    Abuse/Neglect Assessment (Assessment to be complete while patient is alone) Physical Abuse: Denies Verbal Abuse: Denies Sexual Abuse: Denies Exploitation of patient/patient's resources: Denies Self-Neglect: Denies Values / Beliefs Cultural Requests During Hospitalization: None Spiritual Requests During Hospitalization: None Consults Spiritual Care Consult Needed: No Social Work Consult Needed: No Merchant navy officer (For Healthcare) Does patient have an advance directive?: No Would patient like information on creating an advanced directive?: No - patient declined information    Additional Information 1:1 In Past 12 Months?: No CIRT Risk: No Elopement Risk: No Does patient have medical clearance?: Yes     Disposition: Per Renata Caprice, DNP - patient meets inpatient criteria provided that there is no acute organic etiology.   Disposition Initial Assessment Completed for this Encounter: Yes Disposition of Patient: Inpatient treatment program Type of inpatient treatment program: Adult  On Site Evaluation by:   Reviewed with Physician:    Phillip Heal LaVerne 06/03/2015 1:29 PM

## 2015-06-03 NOTE — ED Notes (Addendum)
Pt has one belonging bag that was placed in locker #27 Pt son at bedsdie

## 2015-06-03 NOTE — Progress Notes (Addendum)
Writer received call from patient's son, Denyse AmassCorey, who states that [facility] as it is close to him and it's closer than [place].  Pt[facility] and that his preference will be passed on to the nurse. Pt's nurse aware. Denyse AmassCorey requested that he be notified when patient gets placement so he can be there when the transfer is made as "My mom gets anxious with new places".  [place] Abtsatia Maddalena Linarez, LCSWA Disposition staff 06/03/2015 7:26 PM

## 2015-06-03 NOTE — ED Provider Notes (Signed)
CSN: [id number]     Arrival date & time 06/03/15  1354 History   First MD Initiated Contact with Patient 06/03/15 1504     Chief Complaint  Patient presents with  . Medical Clearance    Level 5 caveat due to psychiatric disorder. (Consider location/radiation/quality/duration/timing/severity/associated sxs/prior Treatment) The history is provided by the patient and a relative.   patient presents with depression and reported confusion. Has had some diarrhea. Has recently been started on Zoloft and things got worse after. States it did not improve her mental status. Reportedly has had some hallucinations also. Patient is somewhat of a poor historian. Has reportedly been working on this for a while to try and find the cause. Reportedly [facility] bed [facility]  Past Medical History  Diagnosis Date  . Anxiety   . Hypothyroidism   . Bipolar disorder   . History of indigestion   . Chronic back pain    Past Surgical History  Procedure Laterality Date  . Joint replacement      bilat knee  . Carpel tunnel    . Tonsillectomy    . Total knee revision  05/01/2012    Procedure: TOTAL KNEE REVISION;  Surgeon: Raymon Mutton, MD;  Location: [facility] OR;  Service: Orthopedics;  Laterality: Right;  . Esophagogastroduodenoscopy N/A 11/06/2014    Procedure: ESOPHAGOGASTRODUODENOSCOPY (EGD);  Surgeon: Charna Elizabeth, MD;  Location: [facility] ENDOSCOPY;  Service: Endoscopy;  Laterality: N/A;   No family history on file. History  Substance Use Topics  . Smoking status: Former Games developer  . Smokeless tobacco: Not on file  . Alcohol Use: No   OB History    No data available     Review of Systems  Unable to perform ROS Constitutional: Positive for appetite change.  Respiratory: Negative for shortness of breath.   Cardiovascular: Negative for chest pain.  Gastrointestinal: Positive for diarrhea.  Genitourinary: Negative for dysuria and hematuria.  Musculoskeletal: Negative for back pain.   Psychiatric/Behavioral: Positive for hallucinations and dysphoric mood.      Allergies  Review of patient's allergies indicates no known allergies.  Home Medications   Prior to Admission medications   Medication Sig Start Date End Date Taking? Authorizing Provider  calcium citrate (CALCITRATE - DOSED IN MG ELEMENTAL CALCIUM) 950 MG tablet Take 200 mg of elemental calcium by mouth daily.   Yes Historical Provider, MD  Cholecalciferol (VITAMIN D) 2000 UNITS CAPS Take 1 capsule by mouth daily.   Yes Historical Provider, MD  divalproex (DEPAKOTE ER) 250 MG 24 hr tablet Take 750 mg by mouth at bedtime.    Yes Historical Provider, MD  ibuprofen (ADVIL,MOTRIN) 200 MG tablet Take 400 mg by mouth every 6 (six) hours as needed for moderate pain.   Yes Historical Provider, MD  ketotifen (ZADITOR) 0.025 % ophthalmic solution Place 1 drop into both eyes 2 (two) times daily.   Yes Historical Provider, MD  levothyroxine (SYNTHROID, LEVOTHROID) 112 MCG tablet Take 112 mcg by mouth daily before breakfast.   Yes Historical Provider, MD  LIDOCAINE HCL, PF, IJ Inject as directed. Gets at each PT appointment   Yes Historical Provider, MD  lithium carbonate 300 MG capsule Take 300 mg by mouth at bedtime.   Yes Historical Provider, MD  LORazepam (ATIVAN) 0.5 MG tablet Take 1 tablet (0.5 mg total) by mouth every 6 (six) hours as needed for anxiety (for acute aggitation and to help sleep). 04/20/15  Yes Danelle Berry, PA-C  sertraline (ZOLOFT) 50  MG tablet Take 50 mg by mouth daily as needed (for depression and anxiety).   Yes Historical Provider, MD  bumetanide (BUMEX) 2 MG tablet Take 0.5 tablets (1 mg total) by mouth daily as needed. Patient not taking: Reported on 04/20/2015 11/07/14   Maretta BeesShanker M Ghimire, MD  ondansetron (ZOFRAN) 4 MG tablet Take 1 tablet (4 mg total) by mouth every 6 (six) hours as needed for nausea. Patient not taking: Reported on 06/03/2015 11/07/14   Maretta BeesShanker M Ghimire, MD   BP 120/69 mmHg   Pulse 59  Temp(Src) 98.3 F (36.8 C) (Oral)  Resp 14  SpO2 100% Physical Exam  Constitutional: She appears well-developed and well-nourished.  HENT:  Head: Atraumatic.  Neck: Neck supple.  Cardiovascular: Regular rhythm.   Pulmonary/Chest: Effort normal.  Abdominal: Soft. There is no tenderness.  Musculoskeletal: Normal range of motion.  Neurological: She is alert.  Skin: Skin is warm.  Psychiatric:  A she appears somewhat distressed. Does not appear to be responding to internal stimuli.    ED Course  Procedures (including critical care time) Labs Review Labs Reviewed  CBC WITH DIFFERENTIAL/PLATELET - Abnormal; Notable for the following:    Hemoglobin 11.9 (*)    All other components within normal limits  COMPREHENSIVE METABOLIC PANEL - Abnormal; Notable for the following:    Total Protein 9.0 (*)    All other components within normal limits  LITHIUM LEVEL - Abnormal; Notable for the following:    Lithium Lvl 0.32 (*)    All other components within normal limits  URINALYSIS, ROUTINE W REFLEX MICROSCOPIC (NOT AT [facility]) - Abnormal; Notable for the following:    Ketones, ur 15 (*)    All other components within normal limits  URINE RAPID DRUG SCREEN, HOSP PERFORMED  ETHANOL  TSH  VALPROIC ACID LEVEL  AMMONIA    Imaging Review No results found.   EKG Interpretation None      MDM   Final diagnoses:  Depression  Hallucinations    Patient with worsening depression. Has had some diarrhea also. May be related to her Zoloft. No clear medical cause found it appears to be medically cleared. Has been accepted at behavioral health but they do not have beds available.    Benjiman CoreNathan Floris Neuhaus, MD 06/03/15 1920

## 2015-06-03 NOTE — Progress Notes (Signed)
The patient's son Denyse AmassCorey wants to be notified of patient's bed availability.

## 2015-06-03 NOTE — Progress Notes (Signed)
CSW met with pt at bedside. Son was present.   Patient signed release of information. CSW placed signed document in chart.  Son states that he is the pt's primary support. He says that he would like to be notified of where the pt has been accepted for inpatient treatment.  Willette Brace [phone] ED CSW 06/03/2015 9:04 PM

## 2015-06-04 ENCOUNTER — Emergency Department (HOSPITAL_COMMUNITY): Payer: Medicare Other

## 2015-06-04 DIAGNOSIS — F329 Major depressive disorder, single episode, unspecified: Secondary | ICD-10-CM | POA: Diagnosis not present

## 2015-06-04 DIAGNOSIS — Z046 Encounter for general psychiatric examination, requested by authority: Secondary | ICD-10-CM | POA: Diagnosis not present

## 2015-06-04 MED ORDER — LORAZEPAM 0.5 MG PO TABS: 0.5000 mg | ORAL_TABLET | Freq: Four times a day (QID) | ORAL | Status: DC | PRN

## 2015-06-04 NOTE — BH Specialist Note (Signed)
Pt is seen by Dr. Betti Cruzeddy in OP, [facility] will present case to Dr. Betti Cruzeddy later in AM to determine if she can be admitted.   Clista BernhardtNancy Prabhav Faulkenberry, [facility] Triage Specialist 06/04/2015 3:34 AM

## 2015-06-04 NOTE — Progress Notes (Addendum)
Report given to Neale BurlyLydia London RN at [facility]. [facility] called for transportation. Left voice message with son Ginnie SmartCorey Torain regarding patient transfer to [facility]. Patient verbally consented to letting son know of discharge to [facility].

## 2015-06-04 NOTE — Progress Notes (Signed)
[facility] received call from [facility] at [facility] stating pt is accepted for admission by Dr. Betti Cruzeddy. Call report to [phone], [facility].  AC spoke with TCU re: pt's placement.  Ilean SkillMeghan Madell Heino, MSW, LCSWA Clinical Social Work, Disposition  06/04/2015 [phone]

## 2015-06-16 DIAGNOSIS — K219 Gastro-esophageal reflux disease without esophagitis: Secondary | ICD-10-CM | POA: Diagnosis not present

## 2015-06-16 DIAGNOSIS — Z87891 Personal history of nicotine dependence: Secondary | ICD-10-CM | POA: Diagnosis not present

## 2015-06-16 DIAGNOSIS — R4182 Altered mental status, unspecified: Secondary | ICD-10-CM | POA: Diagnosis not present

## 2015-06-16 DIAGNOSIS — F312 Bipolar disorder, current episode manic severe with psychotic features: Secondary | ICD-10-CM | POA: Diagnosis not present

## 2015-06-16 DIAGNOSIS — R51 Headache: Secondary | ICD-10-CM | POA: Diagnosis not present

## 2015-06-16 DIAGNOSIS — Z79899 Other long term (current) drug therapy: Secondary | ICD-10-CM | POA: Diagnosis not present

## 2015-06-16 DIAGNOSIS — E039 Hypothyroidism, unspecified: Secondary | ICD-10-CM | POA: Diagnosis not present

## 2015-06-16 DIAGNOSIS — Z96653 Presence of artificial knee joint, bilateral: Secondary | ICD-10-CM | POA: Diagnosis not present

## 2015-06-17 DIAGNOSIS — F29 Unspecified psychosis not due to a substance or known physiological condition: Secondary | ICD-10-CM | POA: Diagnosis not present

## 2015-06-21 DIAGNOSIS — F29 Unspecified psychosis not due to a substance or known physiological condition: Secondary | ICD-10-CM | POA: Diagnosis not present

## 2015-07-26 DIAGNOSIS — E039 Hypothyroidism, unspecified: Secondary | ICD-10-CM | POA: Diagnosis not present

## 2015-07-26 DIAGNOSIS — R609 Edema, unspecified: Secondary | ICD-10-CM | POA: Diagnosis not present

## 2015-07-26 DIAGNOSIS — R9431 Abnormal electrocardiogram [ECG] [EKG]: Secondary | ICD-10-CM | POA: Diagnosis not present

## 2015-07-26 DIAGNOSIS — F251 Schizoaffective disorder, depressive type: Secondary | ICD-10-CM | POA: Diagnosis not present

## 2015-08-25 DIAGNOSIS — M9903 Segmental and somatic dysfunction of lumbar region: Secondary | ICD-10-CM | POA: Diagnosis not present

## 2015-08-25 DIAGNOSIS — M9902 Segmental and somatic dysfunction of thoracic region: Secondary | ICD-10-CM | POA: Diagnosis not present

## 2015-08-25 DIAGNOSIS — M5137 Other intervertebral disc degeneration, lumbosacral region: Secondary | ICD-10-CM | POA: Diagnosis not present

## 2015-08-25 DIAGNOSIS — M5417 Radiculopathy, lumbosacral region: Secondary | ICD-10-CM | POA: Diagnosis not present

## 2015-08-25 DIAGNOSIS — M9905 Segmental and somatic dysfunction of pelvic region: Secondary | ICD-10-CM | POA: Diagnosis not present

## 2015-08-28 DIAGNOSIS — K121 Other forms of stomatitis: Secondary | ICD-10-CM | POA: Diagnosis not present

## 2015-08-28 DIAGNOSIS — Z23 Encounter for immunization: Secondary | ICD-10-CM | POA: Diagnosis not present

## 2015-08-28 DIAGNOSIS — E039 Hypothyroidism, unspecified: Secondary | ICD-10-CM | POA: Diagnosis not present

## 2015-08-28 DIAGNOSIS — R05 Cough: Secondary | ICD-10-CM | POA: Diagnosis not present

## 2015-08-28 DIAGNOSIS — R21 Rash and other nonspecific skin eruption: Secondary | ICD-10-CM | POA: Diagnosis not present

## 2015-08-29 DIAGNOSIS — M5417 Radiculopathy, lumbosacral region: Secondary | ICD-10-CM | POA: Diagnosis not present

## 2015-08-29 DIAGNOSIS — M9903 Segmental and somatic dysfunction of lumbar region: Secondary | ICD-10-CM | POA: Diagnosis not present

## 2015-08-29 DIAGNOSIS — M5137 Other intervertebral disc degeneration, lumbosacral region: Secondary | ICD-10-CM | POA: Diagnosis not present

## 2015-08-29 DIAGNOSIS — M9905 Segmental and somatic dysfunction of pelvic region: Secondary | ICD-10-CM | POA: Diagnosis not present

## 2015-08-29 DIAGNOSIS — M9902 Segmental and somatic dysfunction of thoracic region: Secondary | ICD-10-CM | POA: Diagnosis not present

## 2015-09-05 DIAGNOSIS — M9903 Segmental and somatic dysfunction of lumbar region: Secondary | ICD-10-CM | POA: Diagnosis not present

## 2015-09-05 DIAGNOSIS — M5417 Radiculopathy, lumbosacral region: Secondary | ICD-10-CM | POA: Diagnosis not present

## 2015-09-05 DIAGNOSIS — M9905 Segmental and somatic dysfunction of pelvic region: Secondary | ICD-10-CM | POA: Diagnosis not present

## 2015-09-05 DIAGNOSIS — M5137 Other intervertebral disc degeneration, lumbosacral region: Secondary | ICD-10-CM | POA: Diagnosis not present

## 2015-09-05 DIAGNOSIS — M9902 Segmental and somatic dysfunction of thoracic region: Secondary | ICD-10-CM | POA: Diagnosis not present

## 2015-09-16 ENCOUNTER — Emergency Department (INDEPENDENT_AMBULATORY_CARE_PROVIDER_SITE_OTHER)
Admission: EM | Admit: 2015-09-16 | Discharge: 2015-09-16 | Disposition: A | Discharge status: Home / Self Care | Payer: Medicare Other | Source: Home / Self Care | Attending: Emergency Medicine | Admitting: Emergency Medicine

## 2015-09-16 ENCOUNTER — Emergency Department (INDEPENDENT_AMBULATORY_CARE_PROVIDER_SITE_OTHER): Payer: Medicare Other

## 2015-09-16 ENCOUNTER — Encounter (HOSPITAL_COMMUNITY): Payer: Self-pay | Admitting: Emergency Medicine

## 2015-09-16 DIAGNOSIS — R600 Localized edema: Secondary | ICD-10-CM

## 2015-09-16 DIAGNOSIS — R079 Chest pain, unspecified: Secondary | ICD-10-CM | POA: Diagnosis not present

## 2015-09-16 DIAGNOSIS — L84 Corns and callosities: Secondary | ICD-10-CM

## 2015-09-16 DIAGNOSIS — K2970 Gastritis, unspecified, without bleeding: Secondary | ICD-10-CM

## 2015-09-16 LAB — POCT I-STAT, CHEM 8
BUN: 18 mg/dL (ref 6–20)
CALCIUM ION: 1.29 mmol/L (ref 1.13–1.30)
Chloride: 103 mmol/L (ref 101–111)
Creatinine, Ser: 0.8 mg/dL (ref 0.44–1.00)
Glucose, Bld: 93 mg/dL (ref 65–99)
HCT: 35 % — ABNORMAL LOW (ref 36.0–46.0)
Hemoglobin: 11.9 g/dL — ABNORMAL LOW (ref 12.0–15.0)
Potassium: 4 mmol/L (ref 3.5–5.1)
SODIUM: 140 mmol/L (ref 135–145)
TCO2: 29 mmol/L (ref 0–100)

## 2015-09-16 LAB — POCT H PYLORI SCREEN: H. PYLORI SCREEN, POC: NEGATIVE

## 2015-09-16 MED ORDER — BUMETANIDE 1 MG PO TABS: 1.0000 mg | ORAL_TABLET | Freq: Every day | ORAL | Status: DC

## 2015-09-16 MED ORDER — OMEPRAZOLE 20 MG PO CPDR: 20.0000 mg | DELAYED_RELEASE_CAPSULE | Freq: Every day | ORAL | Status: DC

## 2015-09-16 NOTE — ED Notes (Signed)
Pt has been suffering from bilateral lower extremity swelling and 1-2+ edema for about three weeks.  She states the swelling does not go down, even when legs are elevated.  She denies any SOB with exertion or at rest and has no trouble with breathing at night while sleeping, but she reports SOB when she bends over to pick things up or when putting on her shoes.  She has had surgery on both knees in the past and she reports pain in her left great toe.  She denies any other issues including no fever.

## 2015-09-16 NOTE — Discharge Instructions (Signed)
The pain in your toe is coming from pressure from the callus. Please soak your toe in Epsom salts 3 times a day for 20 minutes. After you soaking it, use a pumice stone on the callus.  The stomach pains you are having are coming from gastritis or heartburn. Take omeprazole daily.  Take Bumex 1 tablet daily for the next week to help with the swelling.  Please follow-up with your primary care doctor in 1 week for a recheck. If you start having a lot of trouble breathing, please go to the emergency room.

## 2015-09-16 NOTE — ED Provider Notes (Signed)
CSN: [id number]     Arrival date & time 09/16/15  1754 History   First MD Initiated Contact with Patient 09/16/15 1807     Chief Complaint  Patient presents with  . Leg Swelling  . Toe Pain   (Consider location/radiation/quality/duration/timing/severity/associated sxs/prior Treatment) HPI  She is a 64 year old woman here for evaluation of leg swelling and toe pain. She does have a history of bipolar and anxiety. She denies any acute concerns about her psychiatric issues.  She states over the last several days she has had increased swelling in her legs. In the past, she has been on a diuretic, but it was stopped due to lab abnormalities. The swelling is present all the time. She denies any orthopnea or dyspnea on exertion. She also reports pain in her left medial great toe for a long time. She has a callus there and anytime pressure is put on the callus, her toe hurts.  She also mentions that she gets a burning epigastric pain after she eats and sometimes when she is laying in bed. It resolves once she drinks a glass of water. No associated diaphoresis or dizziness.  Without any prompting, she tells a story of being seen in the emergency room some time ago for vomiting and diarrhea that ended up with [facility] for [facility] mental health admission.  Past Medical History  Diagnosis Date  . Anxiety   . Hypothyroidism   . Bipolar disorder (HCC)   . History of indigestion   . Chronic back pain    Past Surgical History  Procedure Laterality Date  . Joint replacement      bilat knee  . Carpel tunnel    . Tonsillectomy    . Total knee revision  05/01/2012    Procedure: TOTAL KNEE REVISION;  Surgeon: Raymon MuttonStephen D Lucey, MD;  [facility];  Service: Orthopedics;  Laterality: Right;  . Esophagogastroduodenoscopy N/A 11/06/2014    Procedure: ESOPHAGOGASTRODUODENOSCOPY (EGD);  Surgeon: Charna ElizabethJyothi Mann, MD;  [facility];  Service:  Endoscopy;  Laterality: N/A;   History reviewed. No pertinent family history. Social History  Substance Use Topics  . Smoking status: Former Games developermoker  . Smokeless tobacco: None  . Alcohol Use: No   OB History    No data available     Review of Systems As in history of present illness Allergies  Review of patient's allergies indicates no known allergies.  Home Medications   Prior to Admission medications   Medication Sig Start Date End Date Taking? Authorizing Provider  calcium citrate (CALCITRATE - DOSED IN MG ELEMENTAL CALCIUM) 950 MG tablet Take 200 mg of elemental calcium by mouth daily.   Yes Historical Provider, MD  Cholecalciferol (VITAMIN D) 2000 UNITS CAPS Take 1 capsule by mouth daily.   Yes Historical Provider, MD  divalproex (DEPAKOTE ER) 250 MG 24 hr tablet Take 750 mg by mouth at bedtime.    Yes Historical Provider, MD  ibuprofen (ADVIL,MOTRIN) 200 MG tablet Take 400 mg by mouth every 6 (six) hours as needed for moderate pain.   Yes Historical Provider, MD  ketotifen (ZADITOR) 0.025 % ophthalmic solution Place 1 drop into both eyes 2 (two) times daily.   Yes Historical Provider, MD  levothyroxine (SYNTHROID, LEVOTHROID) 112 MCG tablet Take 112 mcg by mouth daily before breakfast.   Yes Historical Provider, MD  LIDOCAINE HCL, PF, IJ Inject as directed. Gets at each PT appointment   Yes Historical Provider, MD  lithium carbonate 300 MG capsule Take 300 mg by mouth at bedtime.   Yes Historical Provider, MD  LORazepam (ATIVAN) 0.5 MG tablet Take 1 tablet (0.5 mg total) by mouth every 6 (six) hours as needed for anxiety (for acute aggitation and to help sleep). 06/04/15  Yes Earney Navy, NP  sertraline (ZOLOFT) 50 MG tablet Take 50 mg by mouth daily as needed (for depression and anxiety).   Yes Historical Provider, MD  bumetanide (BUMEX) 1 MG tablet Take 1 tablet (1 mg total) by mouth daily. 09/16/15   Charm Rings, MD  omeprazole (PRILOSEC) 20 MG capsule Take 1 capsule  (20 mg total) by mouth daily. 09/16/15   Charm Rings, MD   Meds Ordered and Administered this Visit  Medications - No data to display  BP 144/76 mmHg  Pulse 72  Temp(Src) 98.4 F (36.9 C) (Oral)  Resp 16  SpO2 98% No data found.   Physical Exam  Constitutional: She is oriented to person, place, and time. She appears well-developed and well-nourished. No distress.  Neck: Neck supple.  Cardiovascular: Normal rate, regular rhythm and normal heart sounds.   No murmur heard. Pulmonary/Chest: Effort normal and breath sounds normal. No respiratory distress. She has no wheezes. She has no rales.  Abdominal: Soft. Bowel sounds are normal. She exhibits no distension. There is no tenderness. There is no rebound and no guarding.  Musculoskeletal: She exhibits edema (bilateral 1-2+ edema to knees).  Neurological: She is alert and oriented to person, place, and time.  Skin:  She has a large callus on the left lateral great toe. No erythema or edema.  Psychiatric:  Speech is somewhat pressured, but she is able to be interrupted.    ED Course  Procedures (including critical care time)  Labs Review Labs Reviewed  POCT I-STAT, CHEM 8 - Abnormal; Notable for the following:    Hemoglobin 11.9 (*)    HCT 35.0 (*)    All other components within normal limits  POCT H PYLORI SCREEN    Imaging Review Dg Chest 2 View  09/16/2015  CLINICAL DATA:  Patient with mid chest pain.  Prior smoker. EXAM: CHEST  2 VIEW COMPARISON:  Chest radiograph 06/04/2015 FINDINGS: Stable cardiac and mediastinal contours. No area of pulmonary consolidation. No pleural effusion or pneumothorax. Mild coarse interstitial opacities bilaterally. Regional skeleton is unremarkable. IMPRESSION: Mild coarse interstitial opacities bilaterally which may represent chronic changes or potentially bronchitis. Electronically Signed   By: Annia Belt M.D.   On: 09/16/2015 19:14     MDM   1. Bilateral leg edema   2. Callus of foot    3. Gastritis    Discussed routine care for callus. H. pylori is negative. Treat gastritis/heartburn symptoms with omeprazole. She does not have any symptoms concerning for acute bronchitis. Will treat her lower externally swelling with Bumex 1 mg daily for the next week. She will follow-up with her primary care doctor in 1 week. Return precautions reviewed.    Charm Rings, MD 09/16/15 [phone]

## 2015-09-23 DIAGNOSIS — R609 Edema, unspecified: Secondary | ICD-10-CM | POA: Diagnosis not present

## 2015-09-30 DIAGNOSIS — M9903 Segmental and somatic dysfunction of lumbar region: Secondary | ICD-10-CM | POA: Diagnosis not present

## 2015-09-30 DIAGNOSIS — M9905 Segmental and somatic dysfunction of pelvic region: Secondary | ICD-10-CM | POA: Diagnosis not present

## 2015-09-30 DIAGNOSIS — M9902 Segmental and somatic dysfunction of thoracic region: Secondary | ICD-10-CM | POA: Diagnosis not present

## 2015-09-30 DIAGNOSIS — M5417 Radiculopathy, lumbosacral region: Secondary | ICD-10-CM | POA: Diagnosis not present

## 2015-09-30 DIAGNOSIS — M5137 Other intervertebral disc degeneration, lumbosacral region: Secondary | ICD-10-CM | POA: Diagnosis not present

## 2015-10-02 DIAGNOSIS — M9905 Segmental and somatic dysfunction of pelvic region: Secondary | ICD-10-CM | POA: Diagnosis not present

## 2015-10-02 DIAGNOSIS — M9903 Segmental and somatic dysfunction of lumbar region: Secondary | ICD-10-CM | POA: Diagnosis not present

## 2015-10-02 DIAGNOSIS — M5417 Radiculopathy, lumbosacral region: Secondary | ICD-10-CM | POA: Diagnosis not present

## 2015-10-02 DIAGNOSIS — M9902 Segmental and somatic dysfunction of thoracic region: Secondary | ICD-10-CM | POA: Diagnosis not present

## 2015-10-02 DIAGNOSIS — M5137 Other intervertebral disc degeneration, lumbosacral region: Secondary | ICD-10-CM | POA: Diagnosis not present

## 2015-10-03 DIAGNOSIS — M9902 Segmental and somatic dysfunction of thoracic region: Secondary | ICD-10-CM | POA: Diagnosis not present

## 2015-10-03 DIAGNOSIS — M9905 Segmental and somatic dysfunction of pelvic region: Secondary | ICD-10-CM | POA: Diagnosis not present

## 2015-10-03 DIAGNOSIS — M5137 Other intervertebral disc degeneration, lumbosacral region: Secondary | ICD-10-CM | POA: Diagnosis not present

## 2015-10-03 DIAGNOSIS — M5417 Radiculopathy, lumbosacral region: Secondary | ICD-10-CM | POA: Diagnosis not present

## 2015-10-03 DIAGNOSIS — M9903 Segmental and somatic dysfunction of lumbar region: Secondary | ICD-10-CM | POA: Diagnosis not present

## 2015-10-08 DIAGNOSIS — M5417 Radiculopathy, lumbosacral region: Secondary | ICD-10-CM | POA: Diagnosis not present

## 2015-10-08 DIAGNOSIS — M9903 Segmental and somatic dysfunction of lumbar region: Secondary | ICD-10-CM | POA: Diagnosis not present

## 2015-10-08 DIAGNOSIS — M9902 Segmental and somatic dysfunction of thoracic region: Secondary | ICD-10-CM | POA: Diagnosis not present

## 2015-10-08 DIAGNOSIS — M9905 Segmental and somatic dysfunction of pelvic region: Secondary | ICD-10-CM | POA: Diagnosis not present

## 2015-10-08 DIAGNOSIS — M5137 Other intervertebral disc degeneration, lumbosacral region: Secondary | ICD-10-CM | POA: Diagnosis not present

## 2015-10-09 DIAGNOSIS — M5137 Other intervertebral disc degeneration, lumbosacral region: Secondary | ICD-10-CM | POA: Diagnosis not present

## 2015-10-09 DIAGNOSIS — M9905 Segmental and somatic dysfunction of pelvic region: Secondary | ICD-10-CM | POA: Diagnosis not present

## 2015-10-09 DIAGNOSIS — M5417 Radiculopathy, lumbosacral region: Secondary | ICD-10-CM | POA: Diagnosis not present

## 2015-10-09 DIAGNOSIS — M9903 Segmental and somatic dysfunction of lumbar region: Secondary | ICD-10-CM | POA: Diagnosis not present

## 2015-10-09 DIAGNOSIS — M9902 Segmental and somatic dysfunction of thoracic region: Secondary | ICD-10-CM | POA: Diagnosis not present

## 2015-10-10 DIAGNOSIS — M9902 Segmental and somatic dysfunction of thoracic region: Secondary | ICD-10-CM | POA: Diagnosis not present

## 2015-10-10 DIAGNOSIS — M5137 Other intervertebral disc degeneration, lumbosacral region: Secondary | ICD-10-CM | POA: Diagnosis not present

## 2015-10-10 DIAGNOSIS — M9903 Segmental and somatic dysfunction of lumbar region: Secondary | ICD-10-CM | POA: Diagnosis not present

## 2015-10-10 DIAGNOSIS — M9905 Segmental and somatic dysfunction of pelvic region: Secondary | ICD-10-CM | POA: Diagnosis not present

## 2015-10-10 DIAGNOSIS — M5417 Radiculopathy, lumbosacral region: Secondary | ICD-10-CM | POA: Diagnosis not present

## 2015-10-13 DIAGNOSIS — M9902 Segmental and somatic dysfunction of thoracic region: Secondary | ICD-10-CM | POA: Diagnosis not present

## 2015-10-13 DIAGNOSIS — M5417 Radiculopathy, lumbosacral region: Secondary | ICD-10-CM | POA: Diagnosis not present

## 2015-10-13 DIAGNOSIS — M5137 Other intervertebral disc degeneration, lumbosacral region: Secondary | ICD-10-CM | POA: Diagnosis not present

## 2015-10-13 DIAGNOSIS — M9903 Segmental and somatic dysfunction of lumbar region: Secondary | ICD-10-CM | POA: Diagnosis not present

## 2015-10-13 DIAGNOSIS — M9905 Segmental and somatic dysfunction of pelvic region: Secondary | ICD-10-CM | POA: Diagnosis not present

## 2015-10-15 DIAGNOSIS — M9902 Segmental and somatic dysfunction of thoracic region: Secondary | ICD-10-CM | POA: Diagnosis not present

## 2015-10-15 DIAGNOSIS — M9905 Segmental and somatic dysfunction of pelvic region: Secondary | ICD-10-CM | POA: Diagnosis not present

## 2015-10-15 DIAGNOSIS — M5417 Radiculopathy, lumbosacral region: Secondary | ICD-10-CM | POA: Diagnosis not present

## 2015-10-15 DIAGNOSIS — M9903 Segmental and somatic dysfunction of lumbar region: Secondary | ICD-10-CM | POA: Diagnosis not present

## 2015-10-15 DIAGNOSIS — M5137 Other intervertebral disc degeneration, lumbosacral region: Secondary | ICD-10-CM | POA: Diagnosis not present

## 2015-10-20 DIAGNOSIS — M9902 Segmental and somatic dysfunction of thoracic region: Secondary | ICD-10-CM | POA: Diagnosis not present

## 2015-10-20 DIAGNOSIS — M9903 Segmental and somatic dysfunction of lumbar region: Secondary | ICD-10-CM | POA: Diagnosis not present

## 2015-10-20 DIAGNOSIS — M5417 Radiculopathy, lumbosacral region: Secondary | ICD-10-CM | POA: Diagnosis not present

## 2015-10-20 DIAGNOSIS — M5137 Other intervertebral disc degeneration, lumbosacral region: Secondary | ICD-10-CM | POA: Diagnosis not present

## 2015-10-20 DIAGNOSIS — M9905 Segmental and somatic dysfunction of pelvic region: Secondary | ICD-10-CM | POA: Diagnosis not present

## 2015-10-22 DIAGNOSIS — M5137 Other intervertebral disc degeneration, lumbosacral region: Secondary | ICD-10-CM | POA: Diagnosis not present

## 2015-10-22 DIAGNOSIS — M9905 Segmental and somatic dysfunction of pelvic region: Secondary | ICD-10-CM | POA: Diagnosis not present

## 2015-10-22 DIAGNOSIS — M5417 Radiculopathy, lumbosacral region: Secondary | ICD-10-CM | POA: Diagnosis not present

## 2015-10-22 DIAGNOSIS — M9903 Segmental and somatic dysfunction of lumbar region: Secondary | ICD-10-CM | POA: Diagnosis not present

## 2015-10-22 DIAGNOSIS — M9902 Segmental and somatic dysfunction of thoracic region: Secondary | ICD-10-CM | POA: Diagnosis not present

## 2015-10-27 DIAGNOSIS — M5417 Radiculopathy, lumbosacral region: Secondary | ICD-10-CM | POA: Diagnosis not present

## 2015-10-27 DIAGNOSIS — M5137 Other intervertebral disc degeneration, lumbosacral region: Secondary | ICD-10-CM | POA: Diagnosis not present

## 2015-10-27 DIAGNOSIS — M9903 Segmental and somatic dysfunction of lumbar region: Secondary | ICD-10-CM | POA: Diagnosis not present

## 2015-10-27 DIAGNOSIS — M9905 Segmental and somatic dysfunction of pelvic region: Secondary | ICD-10-CM | POA: Diagnosis not present

## 2015-10-27 DIAGNOSIS — M9902 Segmental and somatic dysfunction of thoracic region: Secondary | ICD-10-CM | POA: Diagnosis not present

## 2015-10-29 DIAGNOSIS — M9905 Segmental and somatic dysfunction of pelvic region: Secondary | ICD-10-CM | POA: Diagnosis not present

## 2015-10-29 DIAGNOSIS — M5137 Other intervertebral disc degeneration, lumbosacral region: Secondary | ICD-10-CM | POA: Diagnosis not present

## 2015-10-29 DIAGNOSIS — M9902 Segmental and somatic dysfunction of thoracic region: Secondary | ICD-10-CM | POA: Diagnosis not present

## 2015-10-29 DIAGNOSIS — M9903 Segmental and somatic dysfunction of lumbar region: Secondary | ICD-10-CM | POA: Diagnosis not present

## 2015-10-29 DIAGNOSIS — M5417 Radiculopathy, lumbosacral region: Secondary | ICD-10-CM | POA: Diagnosis not present

## 2015-11-02 ENCOUNTER — Encounter (HOSPITAL_COMMUNITY): Payer: Self-pay

## 2015-11-02 ENCOUNTER — Emergency Department (HOSPITAL_COMMUNITY)
Admission: EM | Admit: 2015-11-02 | Discharge: 2015-11-02 | Disposition: A | Discharge status: Home / Self Care | Payer: Medicare Other | Attending: Emergency Medicine | Admitting: Emergency Medicine

## 2015-11-02 DIAGNOSIS — Y9389 Activity, other specified: Secondary | ICD-10-CM | POA: Insufficient documentation

## 2015-11-02 DIAGNOSIS — E039 Hypothyroidism, unspecified: Secondary | ICD-10-CM | POA: Insufficient documentation

## 2015-11-02 DIAGNOSIS — F319 Bipolar disorder, unspecified: Secondary | ICD-10-CM | POA: Diagnosis not present

## 2015-11-02 DIAGNOSIS — Z87891 Personal history of nicotine dependence: Secondary | ICD-10-CM | POA: Diagnosis not present

## 2015-11-02 DIAGNOSIS — Y9289 Other specified places as the place of occurrence of the external cause: Secondary | ICD-10-CM | POA: Diagnosis not present

## 2015-11-02 DIAGNOSIS — S39012A Strain of muscle, fascia and tendon of lower back, initial encounter: Secondary | ICD-10-CM | POA: Diagnosis not present

## 2015-11-02 DIAGNOSIS — Z79899 Other long term (current) drug therapy: Secondary | ICD-10-CM | POA: Insufficient documentation

## 2015-11-02 DIAGNOSIS — G8929 Other chronic pain: Secondary | ICD-10-CM | POA: Insufficient documentation

## 2015-11-02 DIAGNOSIS — Y998 Other external cause status: Secondary | ICD-10-CM | POA: Insufficient documentation

## 2015-11-02 DIAGNOSIS — X58XXXA Exposure to other specified factors, initial encounter: Secondary | ICD-10-CM | POA: Diagnosis not present

## 2015-11-02 DIAGNOSIS — F419 Anxiety disorder, unspecified: Secondary | ICD-10-CM | POA: Insufficient documentation

## 2015-11-02 DIAGNOSIS — S3992XA Unspecified injury of lower back, initial encounter: Secondary | ICD-10-CM | POA: Diagnosis present

## 2015-11-02 MED ORDER — CYCLOBENZAPRINE HCL 10 MG PO TABS: 10.0000 mg | ORAL_TABLET | Freq: Two times a day (BID) | ORAL | Status: DC | PRN

## 2015-11-02 MED ORDER — HYDROCODONE-ACETAMINOPHEN 5-325 MG PO TABS: 1.0000 | ORAL_TABLET | Freq: Four times a day (QID) | ORAL | Status: DC | PRN

## 2015-11-02 NOTE — ED Provider Notes (Signed)
CSN: [id number]     Arrival date & time 11/02/15  2012 History   First MD Initiated Contact with Patient 11/02/15 2021     Chief Complaint  Patient presents with  . Hip Pain     (Consider location/radiation/quality/duration/timing/severity/associated sxs/prior Treatment) HPI Comments: Patient presents to the emergency department with chief complaint of left-sided low back pain. She states that the pain started yesterday. She denies any mechanism of injury. She states that feels like a muscle cramp. The pain is worsened with palpation and bending. She has not tried taking anything for her symptoms. She states that she sees a Landchiropractor regularly. She denies any associated fevers, chills, weakness, tingling, bowel or bladder incontinence. Denies difficulty with ambulation.  The history is provided by the patient. No language interpreter was used.    Past Medical History  Diagnosis Date  . Anxiety   . Hypothyroidism   . Bipolar disorder (HCC)   . History of indigestion   . Chronic back pain    Past Surgical History  Procedure Laterality Date  . Joint replacement      bilat knee  . Carpel tunnel    . Tonsillectomy    . Total knee revision  05/01/2012    Procedure: TOTAL KNEE REVISION;  Surgeon: Raymon MuttonStephen D Lucey, MD;  [facility];  Service: Orthopedics;  Laterality: Right;  . Esophagogastroduodenoscopy N/A 11/06/2014    Procedure: ESOPHAGOGASTRODUODENOSCOPY (EGD);  Surgeon: Charna ElizabethJyothi Mann, MD;  [facility];  Service: Endoscopy;  Laterality: N/A;   No family history on file. Social History  Substance Use Topics  . Smoking status: Former Games developermoker  . Smokeless tobacco: None  . Alcohol Use: No   OB History    No data available     Review of Systems  Constitutional: Negative for fever and chills.  Gastrointestinal:       No bowel incontinence  Genitourinary:       No urinary incontinence  Musculoskeletal: Positive for myalgias, back pain and arthralgias.   Neurological:       No saddle anesthesia      Allergies  Review of patient's allergies indicates no known allergies.  Home Medications   Prior to Admission medications   Medication Sig Start Date End Date Taking? Authorizing Provider  bumetanide (BUMEX) 1 MG tablet Take 1 tablet (1 mg total) by mouth daily. 09/16/15   Charm RingsErin J Honig, MD  calcium citrate (CALCITRATE - DOSED IN MG ELEMENTAL CALCIUM) 950 MG tablet Take 200 mg of elemental calcium by mouth daily.    Historical Provider, MD  Cholecalciferol (VITAMIN D) 2000 UNITS CAPS Take 1 capsule by mouth daily.    Historical Provider, MD  cyclobenzaprine (FLEXERIL) 10 MG tablet Take 1 tablet (10 mg total) by mouth 2 (two) times daily as needed for muscle spasms. 11/02/15   Roxy Horsemanobert Annamay Laymon, PA-C  divalproex (DEPAKOTE ER) 250 MG 24 hr tablet Take 750 mg by mouth at bedtime.     Historical Provider, MD  HYDROcodone-acetaminophen (NORCO/VICODIN) 5-325 MG tablet Take 1 tablet by mouth every 6 (six) hours as needed. 11/02/15   Roxy Horsemanobert Maryjo Ragon, PA-C  ibuprofen (ADVIL,MOTRIN) 200 MG tablet Take 400 mg by mouth every 6 (six) hours as needed for moderate pain.    Historical Provider, MD  ketotifen (ZADITOR) 0.025 % ophthalmic solution Place 1 drop into both eyes 2 (two) times daily.    Historical Provider, MD  levothyroxine (SYNTHROID, LEVOTHROID) 112 MCG tablet Take 112 mcg by mouth daily before breakfast.  Historical Provider, MD  LIDOCAINE HCL, PF, IJ Inject as directed. Gets at each PT appointment    Historical Provider, MD  lithium carbonate 300 MG capsule Take 300 mg by mouth at bedtime.    Historical Provider, MD  LORazepam (ATIVAN) 0.5 MG tablet Take 1 tablet (0.5 mg total) by mouth every 6 (six) hours as needed for anxiety (for acute aggitation and to help sleep). 06/04/15   Earney Navy, NP  omeprazole (PRILOSEC) 20 MG capsule Take 1 capsule (20 mg total) by mouth daily. 09/16/15   Charm Rings, MD  sertraline (ZOLOFT) 50 MG tablet  Take 50 mg by mouth daily as needed (for depression and anxiety).    Historical Provider, MD   BP 118/79 mmHg  Pulse 71  Temp(Src) 98.1 F (36.7 C) (Oral)  Resp 18  Ht 5' (1.524 m)  Wt 87.091 kg  BMI 37.50 kg/m2  SpO2 98% Physical Exam  Constitutional: She is oriented to person, place, and time. She appears well-developed and well-nourished. No distress.  HENT:  Head: Normocephalic and atraumatic.  Eyes: Conjunctivae and EOM are normal. Pupils are equal, round, and reactive to light. Right eye exhibits no discharge. Left eye exhibits no discharge. No scleral icterus.  Neck: Normal range of motion. Neck supple. No tracheal deviation present.  Cardiovascular: Normal rate, regular rhythm and normal heart sounds.  Exam reveals no gallop and no friction rub.   No murmur heard. Pulmonary/Chest: Effort normal and breath sounds normal. No respiratory distress. She has no wheezes. She has no rales. She exhibits no tenderness.  Abdominal: Soft. Bowel sounds are normal. She exhibits no distension and no mass. There is no tenderness. There is no rebound and no guarding.  Musculoskeletal: Normal range of motion. She exhibits no edema or tenderness.  Left lumbar paraspinal muscles tender to palpation, no bony tenderness, step-offs, or gross abnormality or deformity of spine, patient is able to ambulate, moves all extremities  Bilateral great toe extension intact Bilateral plantar/dorsiflexion intact  Neurological: She is alert and oriented to person, place, and time.  Sensation and strength intact bilaterally   Skin: Skin is warm and dry. She is not diaphoretic.  Psychiatric: She has a normal mood and affect. Her behavior is normal. Judgment and thought content normal.  Nursing note and vitals reviewed.   ED Course  Procedures (including critical care time)   MDM   Final diagnoses:  Lumbar strain, initial encounter    Patient with back pain.  No neurological deficits and normal neuro  exam.  Patient is ambulatory.  No loss of bowel or bladder control.  Doubt cauda equina.  Denies fever,  doubt epidural abscess or other lesion. Recommend back exercises, stretching, RICE, and will treat with a short course of norco.  Encouraged the patient that there could be a need for additional workup and/or imaging such as MRI, if the symptoms do not resolve. Patient advised that if the back pain does not resolve, or radiates, this could progress to more serious conditions and is encouraged to follow-up with PCP or orthopedics within 2 weeks.       Roxy Horseman, PA-C 11/02/15 2127  Gwyneth Sprout, MD 11/03/15 0010

## 2015-11-02 NOTE — ED Notes (Signed)
Pt stable, ambulatory, states understanding of discharge instructions 

## 2015-11-02 NOTE — ED Notes (Signed)
Pt states she started having left sided lower back and hip pain yesterday that feels like a cramp. Denies any injury or fall.

## 2015-11-02 NOTE — Discharge Instructions (Signed)

## 2015-11-04 ENCOUNTER — Encounter (HOSPITAL_COMMUNITY): Payer: Self-pay | Admitting: Neurology

## 2015-11-04 ENCOUNTER — Emergency Department (HOSPITAL_COMMUNITY)
Admission: EM | Admit: 2015-11-04 | Discharge: 2015-11-04 | Disposition: A | Discharge status: Home / Self Care | Payer: Medicare Other | Attending: Emergency Medicine | Admitting: Emergency Medicine

## 2015-11-04 DIAGNOSIS — E039 Hypothyroidism, unspecified: Secondary | ICD-10-CM | POA: Insufficient documentation

## 2015-11-04 DIAGNOSIS — G8929 Other chronic pain: Secondary | ICD-10-CM | POA: Insufficient documentation

## 2015-11-04 DIAGNOSIS — F319 Bipolar disorder, unspecified: Secondary | ICD-10-CM | POA: Insufficient documentation

## 2015-11-04 DIAGNOSIS — Z87891 Personal history of nicotine dependence: Secondary | ICD-10-CM | POA: Insufficient documentation

## 2015-11-04 DIAGNOSIS — Y998 Other external cause status: Secondary | ICD-10-CM | POA: Diagnosis not present

## 2015-11-04 DIAGNOSIS — S39012A Strain of muscle, fascia and tendon of lower back, initial encounter: Secondary | ICD-10-CM | POA: Diagnosis not present

## 2015-11-04 DIAGNOSIS — Z79899 Other long term (current) drug therapy: Secondary | ICD-10-CM | POA: Diagnosis not present

## 2015-11-04 DIAGNOSIS — X58XXXA Exposure to other specified factors, initial encounter: Secondary | ICD-10-CM | POA: Insufficient documentation

## 2015-11-04 DIAGNOSIS — M25552 Pain in left hip: Secondary | ICD-10-CM | POA: Diagnosis not present

## 2015-11-04 DIAGNOSIS — Y9389 Activity, other specified: Secondary | ICD-10-CM | POA: Diagnosis not present

## 2015-11-04 DIAGNOSIS — Y9289 Other specified places as the place of occurrence of the external cause: Secondary | ICD-10-CM | POA: Insufficient documentation

## 2015-11-04 DIAGNOSIS — M25559 Pain in unspecified hip: Secondary | ICD-10-CM | POA: Diagnosis present

## 2015-11-04 NOTE — Discharge Instructions (Signed)
Back Injury Prevention Back injuries can be very painful. They can also be difficult to heal. After having one back injury, you are more likely to injure your back again. It is important to learn how to avoid injuring or re-injuring your back. The following tips can help you to prevent a back injury. WHAT SHOULD I KNOW ABOUT PHYSICAL FITNESS?  Exercise for 30 minutes per day on most days of the week or as directed by your health care provider. Make sure to:  Do aerobic exercises, such as walking, jogging, biking, or swimming.  Do exercises that increase balance and strength, such as tai chi and yoga. These can decrease your risk of falling and injuring your back.  Do stretching exercises to help with flexibility.  Try to develop strong abdominal muscles. Your abdominal muscles provide a lot of the support that is needed by your back.  Maintain a healthy weight. This helps to decrease your risk of a back injury. WHAT SHOULD I KNOW ABOUT MY DIET?  Talk with your health care provider about your overall diet. Take supplements and vitamins only as directed by your health care provider.  Talk with your health care provider about how much calcium and vitamin D you need each day. These nutrients help to prevent weakening of the bones (osteoporosis). Osteoporosis can cause broken (fractured) bones, which lead to back pain.  Include good sources of calcium in your diet, such as dairy products, green leafy vegetables, and products that have had calcium added to them (fortified).  Include good sources of vitamin D in your diet, such as milk and foods that are fortified with vitamin D. WHAT SHOULD I KNOW ABOUT MY POSTURE?  Sit up straight and stand up straight. Avoid leaning forward when you sit or hunching over when you stand.  Choose chairs that have good low-back (lumbar) support.  If you work at a desk, sit close to it so you do not need to lean over. Keep your chin tucked in. Keep your neck  drawn back, and keep your elbows bent at a right angle. Your arms should look like the letter "L."  Sit high and close to the steering wheel when you drive. Add a lumbar support to your car seat, if needed.  Avoid sitting or standing in one position for very long. Take breaks to get up, stretch, and walk around at least one time every hour. Take breaks every hour if you are driving for long periods of time.  Sleep on your side with your knees slightly bent, or sleep on your back with a pillow under your knees. Do not lie on the front of your body to sleep. WHAT SHOULD I KNOW ABOUT LIFTING, TWISTING, AND REACHING? Lifting and Heavy Lifting  Avoid heavy lifting, especially repetitive heavy lifting. If you must do heavy lifting:  Stretch before lifting.  Work slowly.  Rest between lifts.  Use a tool such as a cart or a dolly to move objects if one is available.  Make several small trips instead of carrying one heavy load.  Ask for help when you need it, especially when moving big objects.  Follow these steps when lifting:  Stand with your feet shoulder-width apart.  Get as close to the object as you can. Do not try to pick up a heavy object that is far from your body.  Use handles or lifting straps if they are available.  Bend at your knees. Squat down, but keep your heels off the floor.  Keep your shoulders pulled back, your chin tucked in, and your back straight.  Lift the object slowly while you tighten the muscles in your legs, abdomen, and buttocks. Keep the object as close to the center of your body as possible.  Follow these steps when putting down a heavy load:  Stand with your feet shoulder-width apart.  Lower the object slowly while you tighten the muscles in your legs, abdomen, and buttocks. Keep the object as close to the center of your body as possible.  Keep your shoulders pulled back, your chin tucked in, and your back straight.  Bend at your knees. Squat  down, but keep your heels off the floor.  Use handles or lifting straps if they are available. Twisting and Reaching  Avoid lifting heavy objects above your waist.  Do not twist at your waist while you are lifting or carrying a load. If you need to turn, move your feet.  Do not bend over without bending at your knees.  Avoid reaching over your head, across a table, or for an object on a high surface. WHAT ARE SOME OTHER TIPS?  Avoid wet floors and icy ground. Keep sidewalks clear of ice to prevent falls.  Do not sleep on a mattress that is too soft or too hard.  Keep items that are used frequently within easy reach.  Put heavier objects on shelves at waist level, and put lighter objects on lower or higher shelves.  Find ways to decrease your stress, such as exercise, massage, or relaxation techniques. Stress can build up in your muscles. Tense muscles are more vulnerable to injury.  Talk with your health care provider if you feel anxious or depressed. These conditions can make back pain worse.  Wear flat heel shoes with cushioned soles.  Avoid sudden movements.  Use both shoulder straps when carrying a backpack.  Do not use any tobacco products, including cigarettes, chewing tobacco, or electronic cigarettes. If you need help quitting, ask your health care provider.   This information is not intended to replace advice given to you by your health care provider. Make sure you discuss any questions you have with your health care provider.   Document Released: 12/16/2004 Document Revised: 03/25/2015 Document Reviewed: 11/12/2014 [employer] Interactive Patient Education 2016 [place] Strain With Rehab A strain is an injury in which a tendon or muscle is torn. The muscles and tendons of the lower back are vulnerable to strains. However, these muscles and tendons are very strong and require a great force to be injured. Strains are classified into three categories. Grade  1 strains cause pain, but the tendon is not lengthened. Grade 2 strains include a lengthened ligament, due to the ligament being stretched or partially ruptured. With grade 2 strains there is still function, although the function may be decreased. Grade 3 strains involve a complete tear of the tendon or muscle, and function is usually impaired. SYMPTOMS   Pain in the lower back.  Pain that affects one side more than the other.  Pain that gets worse with movement and may be felt in the hip, buttocks, or back of the thigh.  Muscle spasms of the muscles in the back.  Swelling along the muscles of the back.  Loss of strength of the back muscles.  Crackling sound (crepitation) when the muscles are touched. CAUSES  Lower back strains occur when a force is placed on the muscles or tendons that is greater than they can handle. Common  causes of injury include:  Prolonged overuse of the muscle-tendon units in the lower back, usually from incorrect posture.  A single violent injury or force applied to the back. RISK INCREASES WITH:  Sports that involve twisting forces on the spine or a lot of bending at the waist (football, rugby, weightlifting, bowling, golf, tennis, speed skating, racquetball, swimming, running, gymnastics, diving).  Poor strength and flexibility.  Failure to warm up properly before activity.  Family history of lower back pain or disk disorders.  Previous back injury or surgery (especially fusion).  Poor posture with lifting, especially heavy objects.  Prolonged sitting, especially with poor posture. PREVENTION   Learn and use proper posture when sitting or lifting (maintain proper posture when sitting, lift using the knees and legs, not at the waist).  Warm up and stretch properly before activity.  Allow for adequate recovery between workouts.  Maintain physical fitness:  Strength, flexibility, and endurance.  Cardiovascular fitness. PROGNOSIS  If treated  properly, lower back strains usually heal within 6 weeks. RELATED COMPLICATIONS   Recurring symptoms, resulting in a chronic problem.  Chronic inflammation, scarring, and partial muscle-tendon tear.  Delayed healing or resolution of symptoms.  Prolonged disability. TREATMENT  Treatment first involves the use of ice and medicine, to reduce pain and inflammation. The use of strengthening and stretching exercises may help reduce pain with activity. These exercises may be performed at home or with a therapist. Severe injuries may require referral to a therapist for further evaluation and treatment, such as ultrasound. Your caregiver may advise that you wear a back brace or corset, to help reduce pain and discomfort. Often, prolonged bed rest results in greater harm then benefit. Corticosteroid injections may be recommended. However, these should be reserved for the most serious cases. It is important to avoid using your back when lifting objects. At night, sleep on your back on a firm mattress with a pillow placed under your knees. If non-surgical treatment is unsuccessful, surgery may be needed.  MEDICATION   If pain medicine is needed, nonsteroidal anti-inflammatory medicines (aspirin and ibuprofen), or other minor pain relievers (acetaminophen), are often advised.  Do not take pain medicine for 7 days before surgery.  Prescription pain relievers may be given, if your caregiver thinks they are needed. Use only as directed and only as much as you need.  Ointments applied to the skin may be helpful.  Corticosteroid injections may be given by your caregiver. These injections should be reserved for the most serious cases, because they may only be given a certain number of times. HEAT AND COLD  Cold treatment (icing) should be applied for 10 to 15 minutes every 2 to 3 hours for inflammation and pain, and immediately after activity that aggravates your symptoms. Use ice packs or an ice  massage.  Heat treatment may be used before performing stretching and strengthening activities prescribed by your caregiver, physical therapist, or athletic trainer. Use a heat pack or a warm water soak. SEEK MEDICAL CARE IF:   Symptoms get worse or do not improve in 2 to 4 weeks, despite treatment.  You develop numbness, weakness, or loss of bowel or bladder function.  New, unexplained symptoms develop. (Drugs used in treatment may produce side effects.) EXERCISES  RANGE OF MOTION (ROM) AND STRETCHING EXERCISES - Low Back Strain Most people with lower back pain will find that their symptoms get worse with excessive bending forward (flexion) or arching at the lower back (extension). The exercises which will help resolve  your symptoms will focus on the opposite motion.  Your physician, physical therapist or athletic trainer will help you determine which exercises will be most helpful to resolve your lower back pain. Do not complete any exercises without first consulting with your caregiver. Discontinue any exercises which make your symptoms worse until you speak to your caregiver.  If you have pain, numbness or tingling which travels down into your buttocks, leg or foot, the goal of the therapy is for these symptoms to move closer to your back and eventually resolve. Sometimes, these leg symptoms will get better, but your lower back pain may worsen. This is typically an indication of progress in your rehabilitation. Be very alert to any changes in your symptoms and the activities in which you participated in the 24 hours prior to the change. Sharing this information with your caregiver will allow him/her to most efficiently treat your condition.  These exercises may help you when beginning to rehabilitate your injury. Your symptoms may resolve with or without further involvement from your physician, physical therapist or athletic trainer. While completing these exercises, remember:  Restoring tissue  flexibility helps normal motion to return to the joints. This allows healthier, less painful movement and activity.  An effective stretch should be held for at least 30 seconds.  A stretch should never be painful. You should only feel a gentle lengthening or release in the stretched tissue. FLEXION RANGE OF MOTION AND STRETCHING EXERCISES: STRETCH - Flexion, Single Knee to Chest   Lie on a firm bed or floor with both legs extended in front of you.  Keeping one leg in contact with the floor, bring your opposite knee to your chest. Hold your leg in place by either grabbing behind your thigh or at your knee.  Pull until you feel a gentle stretch in your lower back. Hold __________ seconds.  Slowly release your grasp and repeat the exercise with the opposite side. Repeat __________ times. Complete this exercise __________ times per day.  STRETCH - Flexion, Double Knee to Chest   Lie on a firm bed or floor with both legs extended in front of you.  Keeping one leg in contact with the floor, bring your opposite knee to your chest.  Tense your stomach muscles to support your back and then lift your other knee to your chest. Hold your legs in place by either grabbing behind your thighs or at your knees.  Pull both knees toward your chest until you feel a gentle stretch in your lower back. Hold __________ seconds.  Tense your stomach muscles and slowly return one leg at a time to the floor. Repeat __________ times. Complete this exercise __________ times per day.  STRETCH - Low Trunk Rotation  Lie on a firm bed or floor. Keeping your legs in front of you, bend your knees so they are both pointed toward the ceiling and your feet are flat on the floor.  Extend your arms out to the side. This will stabilize your upper body by keeping your shoulders in contact with the floor.  Gently and slowly drop both knees together to one side until you feel a gentle stretch in your lower back. Hold for  __________ seconds.  Tense your stomach muscles to support your lower back as you bring your knees back to the starting position. Repeat the exercise to the other side. Repeat __________ times. Complete this exercise __________ times per day  EXTENSION RANGE OF MOTION AND FLEXIBILITY EXERCISES: STRETCH - Extension, Prone  on Elbows   Lie on your stomach on the floor, a bed will be too soft. Place your palms about shoulder width apart and at the height of your head.  Place your elbows under your shoulders. If this is too painful, stack pillows under your chest.  Allow your body to relax so that your hips drop lower and make contact more completely with the floor.  Hold this position for __________ seconds.  Slowly return to lying flat on the floor. Repeat __________ times. Complete this exercise __________ times per day.  RANGE OF MOTION - Extension, Prone Press Ups  Lie on your stomach on the floor, a bed will be too soft. Place your palms about shoulder width apart and at the height of your head.  Keeping your back as relaxed as possible, slowly straighten your elbows while keeping your hips on the floor. You may adjust the placement of your hands to maximize your comfort. As you gain motion, your hands will come more underneath your shoulders.  Hold this position __________ seconds.  Slowly return to lying flat on the floor. Repeat __________ times. Complete this exercise __________ times per day.  RANGE OF MOTION- Quadruped, Neutral Spine   Assume a hands and knees position on a firm surface. Keep your hands under your shoulders and your knees under your hips. You may place padding under your knees for comfort.  Drop your head and point your tail bone toward the ground below you. This will round out your lower back like an angry cat. Hold this position for __________ seconds.  Slowly lift your head and release your tail bone so that your back sags into a large arch, like an old  horse.  Hold this position for __________ seconds.  Repeat this until you feel limber in your lower back.  Now, find your "sweet spot." This will be the most comfortable position somewhere between the two previous positions. This is your neutral spine. Once you have found this position, tense your stomach muscles to support your lower back.  Hold this position for __________ seconds. Repeat __________ times. Complete this exercise __________ times per day.  STRENGTHENING EXERCISES - Low Back Strain These exercises may help you when beginning to rehabilitate your injury. These exercises should be done near your "sweet spot." This is the neutral, low-back arch, somewhere between fully rounded and fully arched, that is your least painful position. When performed in this safe range of motion, these exercises can be used for people who have either a flexion or extension based injury. These exercises may resolve your symptoms with or without further involvement from your physician, physical therapist or athletic trainer. While completing these exercises, remember:   Muscles can gain both the endurance and the strength needed for everyday activities through controlled exercises.  Complete these exercises as instructed by your physician, physical therapist or athletic trainer. Increase the resistance and repetitions only as guided.  You may experience muscle soreness or fatigue, but the pain or discomfort you are trying to eliminate should never worsen during these exercises. If this pain does worsen, stop and make certain you are following the directions exactly. If the pain is still present after adjustments, discontinue the exercise until you can discuss the trouble with your caregiver. STRENGTHENING - Deep Abdominals, Pelvic Tilt  Lie on a firm bed or floor. Keeping your legs in front of you, bend your knees so they are both pointed toward the ceiling and your feet are flat on the  floor.  Tense  your lower abdominal muscles to press your lower back into the floor. This motion will rotate your pelvis so that your tail bone is scooping upwards rather than pointing at your feet or into the floor.  With a gentle tension and even breathing, hold this position for __________ seconds. Repeat __________ times. Complete this exercise __________ times per day.  STRENGTHENING - Abdominals, Crunches   Lie on a firm bed or floor. Keeping your legs in front of you, bend your knees so they are both pointed toward the ceiling and your feet are flat on the floor. Cross your arms over your chest.  Slightly tip your chin down without bending your neck.  Tense your abdominals and slowly lift your trunk high enough to just clear your shoulder blades. Lifting higher can put excessive stress on the lower back and does not further strengthen your abdominal muscles.  Control your return to the starting position. Repeat __________ times. Complete this exercise __________ times per day.  STRENGTHENING - Quadruped, Opposite UE/LE Lift   Assume a hands and knees position on a firm surface. Keep your hands under your shoulders and your knees under your hips. You may place padding under your knees for comfort.  Find your neutral spine and gently tense your abdominal muscles so that you can maintain this position. Your shoulders and hips should form a rectangle that is parallel with the floor and is not twisted.  Keeping your trunk steady, lift your right hand no higher than your shoulder and then your left leg no higher than your hip. Make sure you are not holding your breath. Hold this position __________ seconds.  Continuing to keep your abdominal muscles tense and your back steady, slowly return to your starting position. Repeat with the opposite arm and leg. Repeat __________ times. Complete this exercise __________ times per day.  STRENGTHENING - Lower Abdominals, Double Knee Lift  Lie on a firm bed or  floor. Keeping your legs in front of you, bend your knees so they are both pointed toward the ceiling and your feet are flat on the floor.  Tense your abdominal muscles to brace your lower back and slowly lift both of your knees until they come over your hips. Be certain not to hold your breath.  Hold __________ seconds. Using your abdominal muscles, return to the starting position in a slow and controlled manner. Repeat __________ times. Complete this exercise __________ times per day.  POSTURE AND BODY MECHANICS CONSIDERATIONS - Low Back Strain Keeping correct posture when sitting, standing or completing your activities will reduce the stress put on different body tissues, allowing injured tissues a chance to heal and limiting painful experiences. The following are general guidelines for improved posture. Your physician or physical therapist will provide you with any instructions specific to your needs. While reading these guidelines, remember:  The exercises prescribed by your provider will help you have the flexibility and strength to maintain correct postures.  The correct posture provides the best environment for your joints to work. All of your joints have less wear and tear when properly supported by a spine with good posture. This means you will experience a healthier, less painful body.  Correct posture must be practiced with all of your activities, especially prolonged sitting and standing. Correct posture is as important when doing repetitive low-stress activities (typing) as it is when doing a single heavy-load activity (lifting). RESTING POSITIONS Consider which positions are most painful for you when choosing  a resting position. If you have pain with flexion-based activities (sitting, bending, stooping, squatting), choose a position that allows you to rest in a less flexed posture. You would want to avoid curling into a fetal position on your side. If your pain worsens with  extension-based activities (prolonged standing, working overhead), avoid resting in an extended position such as sleeping on your stomach. Most people will find more comfort when they rest with their spine in a more neutral position, neither too rounded nor too arched. Lying on a non-sagging bed on your side with a pillow between your knees, or on your back with a pillow under your knees will often provide some relief. Keep in mind, being in any one position for a prolonged period of time, no matter how correct your posture, can still lead to stiffness. PROPER SITTING POSTURE In order to minimize stress and discomfort on your spine, you must sit with correct posture. Sitting with good posture should be effortless for a healthy body. Returning to good posture is a gradual process. Many people can work toward this most comfortably by using various supports until they have the flexibility and strength to maintain this posture on their own. When sitting with proper posture, your ears will fall over your shoulders and your shoulders will fall over your hips. You should use the back of the chair to support your upper back. Your lower back will be in a neutral position, just slightly arched. You may place a small pillow or folded towel at the base of your lower back for support.  When working at a desk, create an environment that supports good, upright posture. Without extra support, muscles tire, which leads to excessive strain on joints and other tissues. Keep these recommendations in mind: CHAIR:  A chair should be able to slide under your desk when your back makes contact with the back of the chair. This allows you to work closely.  The chair's height should allow your eyes to be level with the upper part of your monitor and your hands to be slightly lower than your elbows. BODY POSITION  Your feet should make contact with the floor. If this is not possible, use a foot rest.  Keep your ears over your  shoulders. This will reduce stress on your neck and lower back. INCORRECT SITTING POSTURES  If you are feeling tired and unable to assume a healthy sitting posture, do not slouch or slump. This puts excessive strain on your back tissues, causing more damage and pain. Healthier options include:  Using more support, like a lumbar pillow.  Switching tasks to something that requires you to be upright or walking.  Talking a brief walk.  Lying down to rest in a neutral-spine position. PROLONGED STANDING WHILE SLIGHTLY LEANING FORWARD  When completing a task that requires you to lean forward while standing in one place for a long time, place either foot up on a stationary 2-4 inch high object to help maintain the best posture. When both feet are on the ground, the lower back tends to lose its slight inward curve. If this curve flattens (or becomes too large), then the back and your other joints will experience too much stress, tire more quickly, and can cause pain. CORRECT STANDING POSTURES Proper standing posture should be assumed with all daily activities, even if they only take a few moments, like when brushing your teeth. As in sitting, your ears should fall over your shoulders and your shoulders should fall over your  hips. You should keep a slight tension in your abdominal muscles to brace your spine. Your tailbone should point down to the ground, not behind your body, resulting in an over-extended swayback posture.  INCORRECT STANDING POSTURES  Common incorrect standing postures include a forward head, locked knees and/or an excessive swayback. WALKING Walk with an upright posture. Your ears, shoulders and hips should all line-up. PROLONGED ACTIVITY IN A FLEXED POSITION When completing a task that requires you to bend forward at your waist or lean over a low surface, try to find a way to stabilize 3 out of 4 of your limbs. You can place a hand or elbow on your thigh or rest a knee on the surface  you are reaching across. This will provide you more stability so that your muscles do not fatigue as quickly. By keeping your knees relaxed, or slightly bent, you will also reduce stress across your lower back. CORRECT LIFTING TECHNIQUES DO :   Assume a wide stance. This will provide you more stability and the opportunity to get as close as possible to the object which you are lifting.  Tense your abdominals to brace your spine. Bend at the knees and hips. Keeping your back locked in a neutral-spine position, lift using your leg muscles. Lift with your legs, keeping your back straight.  Test the weight of unknown objects before attempting to lift them.  Try to keep your elbows locked down at your sides in order get the best strength from your shoulders when carrying an object.  Always ask for help when lifting heavy or awkward objects. INCORRECT LIFTING TECHNIQUES DO NOT:   Lock your knees when lifting, even if it is a small object.  Bend and twist. Pivot at your feet or move your feet when needing to change directions.  Assume that you can safely pick up even a paper clip without proper posture.   This information is not intended to replace advice given to you by your health care provider. Make sure you discuss any questions you have with your health care provider.   Document Released: 11/08/2005 Document Revised: 11/29/2014 Document Reviewed: 02/20/2009 Elsevier Interactive Patient Education 2016 [employer].  Muscle Strain A muscle strain (pulled muscle) happens when a muscle is stretched beyond normal length. It happens when a sudden, violent force stretches your muscle too far. Usually, a few of the fibers in your muscle are torn. Muscle strain is common in athletes. Recovery usually takes 1-2 weeks. Complete healing takes 5-6 weeks.  HOME CARE   Follow the PRICE method of treatment to help your injury get better. Do this the first 2-3 days after the injury:  Protect. Protect  the muscle to keep it from getting injured again.  Rest. Limit your activity and rest the injured body part.  Ice. Put ice in a plastic bag. Place a towel between your skin and the bag. Then, apply the ice and leave it on from 15-20 minutes each hour. After the third day, switch to moist heat packs.  Compression. Use a splint or elastic bandage on the injured area for comfort. Do not put it on too tightly.  Elevate. Keep the injured body part above the level of your heart.  Only take medicine as told by your doctor.  Warm up before doing exercise to prevent future muscle strains. GET HELP IF:   You have more pain or puffiness (swelling) in the injured area.  You feel numbness, tingling, or notice a loss of strength  in the injured area. MAKE SURE YOU:   Understand these instructions.  Will watch your condition.  Will get help right away if you are not doing well or get worse.   This information is not intended to replace advice given to you by your health care provider. Make sure you discuss any questions you have with your health care provider.   Continue using home pain medication and muscle relaxer that was prescribed at visit 2 days ago. Follow up with your primary care provider if symptoms continue. Return to the ED ONLY IF you experience new or worsening of your symptoms, bowel/bladder incontinence, numbness/tingling in both extremities, fever.

## 2015-11-04 NOTE — ED Provider Notes (Signed)
CSN: [id number]     Arrival date & time 11/04/15  1218 History  By signing my name below, I, [name], attest that this documentation has been prepared under the direction and in the presence of AvayaSamantha Saverio Kader, PA-C. Electronically Signed: [name], ED Scribe. 11/04/2015. 1:17 PM.    Chief Complaint  Patient presents with  . Hip Pain   The history is provided by the patient. No language interpreter was used.     HPI Comments: Brenda Murray is a 64 y.o. female with past medical history of chronic back pain who presents to the Emergency Department complaining of gradual onset, constant, unchanged, cramping achy, left hip pain x 2 days. Pt states that she was laying down when the pain began. No known injury, trauma, or fall. The pain is exacerbated with movement. She was seen in the ED 2 days ago for same pain and given prescription for Norco and Flexeril and advised to follow up with PCP. Pt has not followed up with her PCP since being seen. She reports that her next appointment with her PCP isn't until February 2017 (2 months from now). Pt denies any change in her pain since being seen but reports the Norco is not giving her relief. She has also been taking Advil without relief. Pt has never had symptoms like this in the past. Pt is able to ambulate. Denies swelling in lower extremities, numbness, weakness, urinary or bowel incontinence, saddle anesthesia or any other associated symptoms.   Past Medical History  Diagnosis Date  . Anxiety   . Hypothyroidism   . Bipolar disorder (HCC)   . History of indigestion   . Chronic back pain    Past Surgical History  Procedure Laterality Date  . Joint replacement      bilat knee  . Carpel tunnel    . Tonsillectomy    . Total knee revision  05/01/2012    Procedure: TOTAL KNEE REVISION;  Surgeon: Raymon MuttonStephen D Lucey, MD;  [facility];  Service: Orthopedics;  Laterality: Right;  . Esophagogastroduodenoscopy N/A 11/06/2014    Procedure:  ESOPHAGOGASTRODUODENOSCOPY (EGD);  Surgeon: Charna ElizabethJyothi Mann, MD;  [facility];  Service: Endoscopy;  Laterality: N/A;   No family history on file. Social History  Substance Use Topics  . Smoking status: Former Games developermoker  . Smokeless tobacco: None  . Alcohol Use: No   OB History    No data available     Review of Systems  All other systems reviewed and are negative.  Allergies  Review of patient's allergies indicates no known allergies.  Home Medications   Prior to Admission medications   Medication Sig Start Date End Date Taking? Authorizing Provider  bumetanide (BUMEX) 1 MG tablet Take 1 tablet (1 mg total) by mouth daily. 09/16/15   Charm RingsErin J Honig, MD  calcium citrate (CALCITRATE - DOSED IN MG ELEMENTAL CALCIUM) 950 MG tablet Take 200 mg of elemental calcium by mouth daily.    Historical Provider, MD  Cholecalciferol (VITAMIN D) 2000 UNITS CAPS Take 1 capsule by mouth daily.    Historical Provider, MD  cyclobenzaprine (FLEXERIL) 10 MG tablet Take 1 tablet (10 mg total) by mouth 2 (two) times daily as needed for muscle spasms. 11/02/15   Roxy Horsemanobert Browning, PA-C  divalproex (DEPAKOTE ER) 250 MG 24 hr tablet Take 750 mg by mouth at bedtime.     Historical Provider, MD  HYDROcodone-acetaminophen (NORCO/VICODIN) 5-325 MG tablet Take 1 tablet by mouth every 6 (six) hours as  needed. 11/02/15   Roxy Horseman, PA-C  ibuprofen (ADVIL,MOTRIN) 200 MG tablet Take 400 mg by mouth every 6 (six) hours as needed for moderate pain.    Historical Provider, MD  ketotifen (ZADITOR) 0.025 % ophthalmic solution Place 1 drop into both eyes 2 (two) times daily.    Historical Provider, MD  levothyroxine (SYNTHROID, LEVOTHROID) 112 MCG tablet Take 112 mcg by mouth daily before breakfast.    Historical Provider, MD  LIDOCAINE HCL, PF, IJ Inject as directed. Gets at each PT appointment    Historical Provider, MD  lithium carbonate 300 MG capsule Take 300 mg by mouth at bedtime.    Historical Provider, MD   LORazepam (ATIVAN) 0.5 MG tablet Take 1 tablet (0.5 mg total) by mouth every 6 (six) hours as needed for anxiety (for acute aggitation and to help sleep). 06/04/15   Earney Navy, NP  omeprazole (PRILOSEC) 20 MG capsule Take 1 capsule (20 mg total) by mouth daily. 09/16/15   Charm Rings, MD  sertraline (ZOLOFT) 50 MG tablet Take 50 mg by mouth daily as needed (for depression and anxiety).    Historical Provider, MD   Triage Vitals: BP 113/76 mmHg  Pulse 83  Temp(Src) 98.4 F (36.9 C) (Oral)  Resp 16  SpO2 99%   Physical Exam  Constitutional: She is oriented to person, place, and time. She appears well-developed and well-nourished. No distress.  HENT:  Head: Normocephalic and atraumatic.  Eyes: Conjunctivae are normal. Right eye exhibits no discharge. Left eye exhibits no discharge. No scleral icterus.  Neck: Neck supple.  Cardiovascular: Normal rate.   Pulmonary/Chest: Effort normal.  Musculoskeletal:  L lumbar paraspinal muscle TTP. No midline spinal tenderness. No set-offs. No obvious bony deformity. FROM of lumbar spine.   Lymphadenopathy:    She has no cervical adenopathy.  Neurological: She is alert and oriented to person, place, and time. Coordination normal.  Strength 5/5 throughout. No sensory deficits.  No gait abnormality.   Skin: Skin is warm and dry. No rash noted. She is not diaphoretic. No erythema. No pallor.  Psychiatric: She has a normal mood and affect. Her behavior is normal.  Nursing note and vitals reviewed.   ED Course  Procedures (including critical care time)  DIAGNOSTIC STUDIES: Oxygen Saturation is 99% on RA, normal by my interpretation.    COORDINATION OF CARE: 1:12 PM-Discussed treatment plan which includes applying heating pad and following up with PCP with pt at bedside and pt agreed to plan.   Labs Review Labs Reviewed - No data to display  Imaging Review No results found.   EKG Interpretation None      MDM   Final diagnoses:   Lumbar strain, initial encounter   Patient with back pain, seen in ED 2 days ago for same. No change in pain.  No neurological deficits and normal neuro exam. Pt is ambulatory in ED. No loss of bowel or bladder control.  No concern for cauda equina.  No fever, night sweats, weight loss, h/o cancer, IVDU.  RICE protocol and pain medicine indicated and discussed with patient. Pt may take home pain medication and f/u with ortho if symptoms do not improve.  I personally performed the services described in this documentation, which was scribed in my presence. The recorded information has been reviewed and is accurate.       Lester Kinsman [place], PA-C 11/07/15 1607  Mancel Bale, MD 11/07/15 [phone]

## 2015-11-04 NOTE — ED Notes (Addendum)
Pt reports left sided middle/lower back pain into left hip. Denies injury or fall. Was seen here yesterday for same and given medications. Would like an xray today.

## 2015-11-18 DIAGNOSIS — J00 Acute nasopharyngitis [common cold]: Secondary | ICD-10-CM | POA: Diagnosis not present

## 2015-11-18 DIAGNOSIS — R062 Wheezing: Secondary | ICD-10-CM | POA: Diagnosis not present

## 2015-11-18 DIAGNOSIS — R05 Cough: Secondary | ICD-10-CM | POA: Diagnosis not present

## 2015-11-21 ENCOUNTER — Emergency Department (INDEPENDENT_AMBULATORY_CARE_PROVIDER_SITE_OTHER)
Admission: EM | Admit: 2015-11-21 | Discharge: 2015-11-21 | Disposition: A | Discharge status: Home / Self Care | Payer: Medicare Other | Source: Home / Self Care

## 2015-11-21 ENCOUNTER — Encounter (HOSPITAL_COMMUNITY): Payer: Self-pay | Admitting: Emergency Medicine

## 2015-11-21 DIAGNOSIS — J40 Bronchitis, not specified as acute or chronic: Secondary | ICD-10-CM

## 2015-11-21 DIAGNOSIS — J209 Acute bronchitis, unspecified: Secondary | ICD-10-CM

## 2015-11-21 MED ORDER — IPRATROPIUM-ALBUTEROL 0.5-2.5 (3) MG/3ML IN SOLN
RESPIRATORY_TRACT | Status: AC
  Filled 2015-11-21: qty 3

## 2015-11-21 MED ORDER — ALBUTEROL SULFATE HFA 108 (90 BASE) MCG/ACT IN AERS: 2.0000 | INHALATION_SPRAY | RESPIRATORY_TRACT | Status: DC | PRN

## 2015-11-21 MED ORDER — IPRATROPIUM-ALBUTEROL 0.5-2.5 (3) MG/3ML IN SOLN
3.0000 mL | Freq: Once | RESPIRATORY_TRACT | Status: AC
  Administered 2015-11-21: 3 mL via RESPIRATORY_TRACT

## 2015-11-21 MED ORDER — PREDNISONE 10 MG PO TABS: 40.0000 mg | ORAL_TABLET | Freq: Once | ORAL | Status: DC

## 2015-11-21 MED ORDER — AZITHROMYCIN 250 MG PO TABS: ORAL_TABLET | ORAL | Status: DC

## 2015-11-21 NOTE — ED Provider Notes (Signed)
CSN: [id number]     Arrival date & time 11/21/15  1419 History   None    Chief Complaint  Patient presents with  . Cough   (Consider location/radiation/quality/duration/timing/severity/associated sxs/prior Treatment) HPI Cough, Upper chest infection symptoms with wheezing, no chest pain, no fever, OTC meds with minimal if any relief  Past Medical History  Diagnosis Date  . Anxiety   . Hypothyroidism   . Bipolar disorder (HCC)   . History of indigestion   . Chronic back pain    Past Surgical History  Procedure Laterality Date  . Joint replacement      bilat knee  . Carpel tunnel    . Tonsillectomy    . Total knee revision  05/01/2012    Procedure: TOTAL KNEE REVISION;  Surgeon: Raymon Mutton, MD;  Location: [facility] OR;  Service: Orthopedics;  Laterality: Right;  . Esophagogastroduodenoscopy N/A 11/06/2014    Procedure: ESOPHAGOGASTRODUODENOSCOPY (EGD);  Surgeon: Charna Elizabeth, MD;  Location: [facility] ENDOSCOPY;  Service: Endoscopy;  Laterality: N/A;   History reviewed. No pertinent family history. Social History  Substance Use Topics  . Smoking status: Former Games developer  . Smokeless tobacco: None  . Alcohol Use: No   OB History    No data available     Review of Systems ROS +'ve chest infection symptoms  Denies: HEADACHE, NAUSEA, ABDOMINAL PAIN, CHEST PAIN, CONGESTION, DYSURIA, SHORTNESS OF BREATH  Allergies  Review of patient's allergies indicates no known allergies.  Home Medications   Prior to Admission medications   Medication Sig Start Date End Date Taking? Authorizing Provider  albuterol (PROVENTIL HFA;VENTOLIN HFA) 108 (90 Base) MCG/ACT inhaler Inhale 2 puffs into the lungs every 4 (four) hours as needed for wheezing or shortness of breath. 11/21/15   Tharon Aquas, PA  azithromycin (ZITHROMAX) 250 MG tablet Take first 2 tablets together, then 1 every day until finished. 11/21/15   Tharon Aquas, PA  bumetanide (BUMEX) 1 MG tablet Take 1 tablet (1 mg total) by mouth  daily. 09/16/15   Charm Rings, MD  calcium citrate (CALCITRATE - DOSED IN MG ELEMENTAL CALCIUM) 950 MG tablet Take 200 mg of elemental calcium by mouth daily.    Historical Provider, MD  Cholecalciferol (VITAMIN D) 2000 UNITS CAPS Take 1 capsule by mouth daily.    Historical Provider, MD  cyclobenzaprine (FLEXERIL) 10 MG tablet Take 1 tablet (10 mg total) by mouth 2 (two) times daily as needed for muscle spasms. 11/02/15   Roxy Horseman, PA-C  divalproex (DEPAKOTE ER) 250 MG 24 hr tablet Take 750 mg by mouth at bedtime.     Historical Provider, MD  HYDROcodone-acetaminophen (NORCO/VICODIN) 5-325 MG tablet Take 1 tablet by mouth every 6 (six) hours as needed. 11/02/15   Roxy Horseman, PA-C  ibuprofen (ADVIL,MOTRIN) 200 MG tablet Take 400 mg by mouth every 6 (six) hours as needed for moderate pain.    Historical Provider, MD  ketotifen (ZADITOR) 0.025 % ophthalmic solution Place 1 drop into both eyes 2 (two) times daily.    Historical Provider, MD  levothyroxine (SYNTHROID, LEVOTHROID) 112 MCG tablet Take 112 mcg by mouth daily before breakfast.    Historical Provider, MD  LIDOCAINE HCL, PF, IJ Inject as directed. Gets at each PT appointment    Historical Provider, MD  lithium carbonate 300 MG capsule Take 300 mg by mouth at bedtime.    Historical Provider, MD  LORazepam (ATIVAN) 0.5 MG tablet Take 1 tablet (0.5 mg total) by mouth every 6 (six)  hours as needed for anxiety (for acute aggitation and to help sleep). 06/04/15   Earney NavyJosephine C [place], NP  omeprazole (PRILOSEC) 20 MG capsule Take 1 capsule (20 mg total) by mouth daily. 09/16/15   Charm RingsErin J Honig, MD  predniSONE (DELTASONE) 10 MG tablet Take 4 tablets (40 mg total) by mouth once. 11/21/15   Tharon AquasFrank C Patrick, PA  sertraline (ZOLOFT) 50 MG tablet Take 50 mg by mouth daily as needed (for depression and anxiety).    Historical Provider, MD   Meds Ordered and Administered this Visit   Medications  ipratropium-albuterol (DUONEB) 0.5-2.5 (3) MG/3ML  nebulizer solution 3 mL (3 mLs Nebulization Given 11/21/15 1740)    BP 124/83 mmHg  Pulse 70  Temp(Src) 98.1 F (36.7 C) (Oral)  Resp 18  SpO2 99% No data found.   Physical Exam  Constitutional: She is oriented to person, place, and time. She appears well-developed and well-nourished. No distress.  HENT:  Head: Normocephalic and atraumatic.  Right Ear: External ear normal.  Left Ear: External ear normal.  Nose: Nose normal.  Mouth/Throat: Oropharynx is clear and moist.  Eyes: Conjunctivae are normal.  Cardiovascular: Normal rate.   Pulmonary/Chest: Effort normal. She has wheezes. She has no rales. She exhibits tenderness.  Abdominal: Soft. Bowel sounds are normal.  Neurological: She is alert and oriented to person, place, and time.  Skin: Skin is warm and dry.  Psychiatric: She has a normal mood and affect. Her behavior is normal. Judgment and thought content normal.  Nursing note and vitals reviewed.   ED Course  Procedures (including critical care time)  Labs Review Labs Reviewed - No data to display  Imaging Review No results found.   Visual Acuity Review  Right Eye Distance:   Left Eye Distance:   Bilateral Distance:    Right Eye Near:   Left Eye Near:    Bilateral Near:         MDM   1. Bronchitis with bronchospasm    Pt states symptoms are somewhat better after neb treatment  Patient is advised to continue home symptomatic treatment. Prescription for zpak and albuterol sent pharmacy patient has indicated. Patient is advised that if there are new or worsening symptoms or attend the emergency department, or contact primary care provider. Instructions of care provided discharged home in stable condition.  THIS NOTE WAS GENERATED USING A VOICE RECOGNITION SOFTWARE PROGRAM. ALL REASONABLE EFFORTS  WERE MADE TO PROOFREAD THIS DOCUMENT FOR ACCURACY.     Tharon AquasFrank C Patrick, PA 11/23/15 1021

## 2015-11-21 NOTE — ED Notes (Signed)
[facility] with a [facility] of a cough x 1 week. The patient stated that she was evaluated by her pcp and prescribed tessalon pearls but it has not helped. The patient also complained of dizziness after taking the tessalon pearls.

## 2015-11-21 NOTE — Discharge Instructions (Signed)
Upper Respiratory Infection, Adult °Most upper respiratory infections (URIs) are a viral infection of the air passages leading to the lungs. A URI affects the nose, throat, and upper air passages. The most common type of URI is nasopharyngitis and is typically referred to as "the common cold." °URIs run their course and usually go away on their own. Most of the time, a URI does not require medical attention, but sometimes a bacterial infection in the upper airways can follow a viral infection. This is called a secondary infection. Sinus and middle ear infections are common types of secondary upper respiratory infections. °Bacterial pneumonia can also complicate a URI. A URI can worsen asthma and chronic obstructive pulmonary disease (COPD). Sometimes, these complications can require emergency medical care and may be life threatening.  °CAUSES °Almost all URIs are caused by viruses. A virus is a type of germ and can spread from one person to another.  °RISKS FACTORS °You may be at risk for a URI if:  °· You smoke.   °· You have chronic heart or lung disease. °· You have a weakened defense (immune) system.   °· You are very young or very old.   °· You have nasal allergies or asthma. °· You work in crowded or poorly ventilated areas. °· You work in health care facilities or schools. °SIGNS AND SYMPTOMS  °Symptoms typically develop 2-3 days after you come in contact with a cold virus. Most viral URIs last 7-10 days. However, viral URIs from the influenza virus (flu virus) can last 14-18 days and are typically more severe. Symptoms may include:  °· Runny or stuffy (congested) nose.   °· Sneezing.   °· Cough.   °· Sore throat.   °· Headache.   °· Fatigue.   °· Fever.   °· Loss of appetite.   °· Pain in your forehead, behind your eyes, and over your cheekbones (sinus pain). °· Muscle aches.   °DIAGNOSIS  °Your health care provider may diagnose a URI by: °· Physical exam. °· Tests to check that your symptoms are not due to  another condition such as: °· Strep throat. °· Sinusitis. °· Pneumonia. °· Asthma. °TREATMENT  °A URI goes away on its own with time. It cannot be cured with medicines, but medicines may be prescribed or recommended to relieve symptoms. Medicines may help: °· Reduce your fever. °· Reduce your cough. °· Relieve nasal congestion. °HOME CARE INSTRUCTIONS  °· Take medicines only as directed by your health care provider.   °· Gargle warm saltwater or take cough drops to comfort your throat as directed by your health care provider. °· Use a warm mist humidifier or inhale steam from a shower to increase air moisture. This may make it easier to breathe. °· Drink enough fluid to keep your urine clear or pale yellow.   °· Eat soups and other clear broths and maintain good nutrition.   °· Rest as needed.   °· Return to work when your temperature has returned to normal or as your health care provider advises. You may need to stay home longer to avoid infecting others. You can also use a face mask and careful hand washing to prevent spread of the virus. °· Increase the usage of your inhaler if you have asthma.   °· Do not use any tobacco products, including cigarettes, chewing tobacco, or electronic cigarettes. If you need help quitting, ask your health care provider. °PREVENTION  °The best way to protect yourself from getting a cold is to practice good hygiene.  °· Avoid oral or hand contact with people with cold   symptoms.   °· Wash your hands often if contact occurs.   °There is no clear evidence that vitamin C, vitamin E, echinacea, or exercise reduces the chance of developing a cold. However, it is always recommended to get plenty of rest, exercise, and practice good nutrition.  °SEEK MEDICAL CARE IF:  °· You are getting worse rather than better.   °· Your symptoms are not controlled by medicine.   °· You have chills. °· You have worsening shortness of breath. °· You have brown or red mucus. °· You have yellow or brown nasal  discharge. °· You have pain in your face, especially when you bend forward. °· You have a fever. °· You have swollen neck glands. °· You have pain while swallowing. °· You have white areas in the back of your throat. °SEEK IMMEDIATE MEDICAL CARE IF:  °· You have severe or persistent: °¨ Headache. °¨ Ear pain. °¨ Sinus pain. °¨ Chest pain. °· You have chronic lung disease and any of the following: °¨ Wheezing. °¨ Prolonged cough. °¨ Coughing up blood. °¨ A change in your usual mucus. °· You have a stiff neck. °· You have changes in your: °¨ Vision. °¨ Hearing. °¨ Thinking. °¨ Mood. °MAKE SURE YOU:  °· Understand these instructions. °· Will watch your condition. °· Will get help right away if you are not doing well or get worse. °  °This information is not intended to replace advice given to you by your health care provider. Make sure you discuss any questions you have with your health care provider. °  °Document Released: 05/04/2001 Document Revised: 03/25/2015 Document Reviewed: 02/13/2014 °Elsevier Interactive Patient Education ©2016 Else[employer] Inc. ° °How to Use an Inhaler °Using your inhaler correctly is very important. Good technique will make sure that the medicine reaches your lungs.  °HOW TO USE AN INHALER: °· Take the cap off the inhaler. °· If this is the first time using your inhaler, you need to prime it. Shake the inhaler for 5 seconds. Release four puffs into the air, away from your face. Ask your doctor for help if you have questions. °· Shake the inhaler for 5 seconds. °· Turn the inhaler so the bottle is above the mouthpiece. °· Put your pointer finger on top of the bottle. Your thumb holds the bottom of the inhaler. °· Open your mouth. °· Either hold the inhaler away from your mouth (the width of 2 fingers) or place your lips tightly around the mouthpiece. Ask your doctor which way to use your inhaler. °· Breathe out as much air as possible. °· Breathe in and push down on the bottle 1 time to release  the medicine. You will feel the medicine go in your mouth and throat. °· Continue to take a deep breath in very slowly. Try to fill your lungs. °· After you have breathed in completely, hold your breath for 10 seconds. This will help the medicine to settle in your lungs. If you cannot hold your breath for 10 seconds, hold it for as long as you can before you breathe out. °· Breathe out slowly, through pursed lips. Whistling is an example of pursed lips. °· If your doctor has told you to take more than 1 puff, wait at least 15-30 seconds between puffs. This will help you get the best results from your medicine. Do not use the inhaler more than your doctor tells you to. °· Put the cap back on the inhaler. °· Follow the directions from your doctor or from   the inhaler package about cleaning the inhaler. °If you use more than one inhaler, ask your doctor which inhalers to use and what order to use them in. Ask your doctor to help you figure out when you will need to refill your inhaler.  °If you use a steroid inhaler, always rinse your mouth with water after your last puff, gargle and spit out the water. Do not swallow the water. °GET HELP IF: °· The inhaler medicine only partially helps to stop wheezing or shortness of breath. °· You are having trouble using your inhaler. °· You have some increase in thick spit (phlegm). °GET HELP RIGHT AWAY IF: °· The inhaler medicine does not help your wheezing or shortness of breath or you have tightness in your chest. °· You have dizziness, headaches, or fast heart rate. °· You have chills, fever, or night sweats. °· You have a large increase of thick spit, or your thick spit is bloody. °MAKE SURE YOU:  °· Understand these instructions. °· Will watch your condition. °· Will get help right away if you are not doing well or get worse. °  °This information is not intended to replace advice given to you by your health care provider. Make sure you discuss any questions you have with your  health care provider. °  °Document Released: 08/17/2008 Document Revised: 08/29/2013 Document Reviewed: 06/07/2013 °Elsevier Interactive Patient Education ©2016 Elsevier Inc. °Acute Bronchitis °Bronchitis is when the airways that extend from the windpipe into the lungs get red, puffy, and painful (inflamed). Bronchitis often causes thick spit (mucus) to develop. This leads to a cough. A cough is the most common symptom of bronchitis. °In acute bronchitis, the condition usually begins suddenly and goes away over time (usually in 2 weeks). Smoking, allergies, and asthma can make bronchitis worse. Repeated episodes of bronchitis may cause more lung problems. °HOME CARE °· Rest. °· Drink enough fluids to keep your pee (urine) clear or pale yellow (unless you need to limit fluids as told by your doctor). °· Only take over-the-counter or prescription medicines as told by your doctor. °· Avoid smoking and secondhand smoke. These can make bronchitis worse. If you are a smoker, think about using nicotine gum or skin patches. Quitting smoking will help your lungs heal faster. °· Reduce the chance of getting bronchitis again by: °¨ Washing your hands often. °¨ Avoiding people with cold symptoms. °¨ Trying not to touch your hands to your mouth, nose, or eyes. °· Follow up with your doctor as told. °GET HELP IF: °Your symptoms do not improve after 1 week of treatment. Symptoms include: °· Cough. °· Fever. °· Coughing up thick spit. °· Body aches. °· Chest congestion. °· Chills. °· Shortness of breath. °· Sore throat. °GET HELP RIGHT AWAY IF:  °· You have an increased fever. °· You have chills. °· You have severe shortness of breath. °· You have bloody thick spit (sputum). °· You throw up (vomit) often. °· You lose too much body fluid (dehydration). °· You have a severe headache. °· You faint. °MAKE SURE YOU:  °· Understand these instructions. °· Will watch your condition. °· Will get help right away if you are not doing well or  get worse. °  °This information is not intended to replace advice given to you by your health care provider. Make sure you discuss any questions you have with your health care provider. °  °Document Released: 04/26/2008 Document Revised: 07/11/2013 Document Reviewed: 05/01/2013 °Elsevier Interactive Patient Education ©2016 Elsevier   Inc. ° °

## 2015-11-26 DIAGNOSIS — M9903 Segmental and somatic dysfunction of lumbar region: Secondary | ICD-10-CM | POA: Diagnosis not present

## 2015-11-26 DIAGNOSIS — M9905 Segmental and somatic dysfunction of pelvic region: Secondary | ICD-10-CM | POA: Diagnosis not present

## 2015-11-26 DIAGNOSIS — M5137 Other intervertebral disc degeneration, lumbosacral region: Secondary | ICD-10-CM | POA: Diagnosis not present

## 2015-11-26 DIAGNOSIS — M9902 Segmental and somatic dysfunction of thoracic region: Secondary | ICD-10-CM | POA: Diagnosis not present

## 2015-11-26 DIAGNOSIS — M5417 Radiculopathy, lumbosacral region: Secondary | ICD-10-CM | POA: Diagnosis not present

## 2015-12-03 DIAGNOSIS — M9905 Segmental and somatic dysfunction of pelvic region: Secondary | ICD-10-CM | POA: Diagnosis not present

## 2015-12-03 DIAGNOSIS — M5417 Radiculopathy, lumbosacral region: Secondary | ICD-10-CM | POA: Diagnosis not present

## 2015-12-03 DIAGNOSIS — M5137 Other intervertebral disc degeneration, lumbosacral region: Secondary | ICD-10-CM | POA: Diagnosis not present

## 2015-12-03 DIAGNOSIS — M9903 Segmental and somatic dysfunction of lumbar region: Secondary | ICD-10-CM | POA: Diagnosis not present

## 2015-12-03 DIAGNOSIS — M9902 Segmental and somatic dysfunction of thoracic region: Secondary | ICD-10-CM | POA: Diagnosis not present

## 2015-12-09 DIAGNOSIS — M5137 Other intervertebral disc degeneration, lumbosacral region: Secondary | ICD-10-CM | POA: Diagnosis not present

## 2015-12-09 DIAGNOSIS — M9903 Segmental and somatic dysfunction of lumbar region: Secondary | ICD-10-CM | POA: Diagnosis not present

## 2015-12-09 DIAGNOSIS — M5417 Radiculopathy, lumbosacral region: Secondary | ICD-10-CM | POA: Diagnosis not present

## 2015-12-09 DIAGNOSIS — M9905 Segmental and somatic dysfunction of pelvic region: Secondary | ICD-10-CM | POA: Diagnosis not present

## 2015-12-09 DIAGNOSIS — M9902 Segmental and somatic dysfunction of thoracic region: Secondary | ICD-10-CM | POA: Diagnosis not present

## 2015-12-11 ENCOUNTER — Other Ambulatory Visit: Payer: Self-pay

## 2015-12-11 DIAGNOSIS — Z1231 Encounter for screening mammogram for malignant neoplasm of breast: Secondary | ICD-10-CM

## 2016-01-07 DIAGNOSIS — E782 Mixed hyperlipidemia: Secondary | ICD-10-CM | POA: Diagnosis not present

## 2016-01-07 DIAGNOSIS — R7309 Other abnormal glucose: Secondary | ICD-10-CM | POA: Diagnosis not present

## 2016-01-07 DIAGNOSIS — E039 Hypothyroidism, unspecified: Secondary | ICD-10-CM | POA: Diagnosis not present

## 2016-01-07 DIAGNOSIS — Z23 Encounter for immunization: Secondary | ICD-10-CM | POA: Diagnosis not present

## 2016-01-07 DIAGNOSIS — Z1231 Encounter for screening mammogram for malignant neoplasm of breast: Secondary | ICD-10-CM | POA: Diagnosis not present

## 2016-01-07 DIAGNOSIS — Z0000 Encounter for general adult medical examination without abnormal findings: Secondary | ICD-10-CM | POA: Diagnosis not present

## 2016-01-07 DIAGNOSIS — Z1151 Encounter for screening for human papillomavirus (HPV): Secondary | ICD-10-CM | POA: Diagnosis not present

## 2016-01-07 DIAGNOSIS — Z124 Encounter for screening for malignant neoplasm of cervix: Secondary | ICD-10-CM | POA: Diagnosis not present

## 2016-01-13 DIAGNOSIS — H40013 Open angle with borderline findings, low risk, bilateral: Secondary | ICD-10-CM | POA: Diagnosis not present

## 2016-01-13 DIAGNOSIS — H35363 Drusen (degenerative) of macula, bilateral: Secondary | ICD-10-CM | POA: Diagnosis not present

## 2016-01-13 DIAGNOSIS — H524 Presbyopia: Secondary | ICD-10-CM | POA: Diagnosis not present

## 2016-01-13 DIAGNOSIS — Z961 Presence of intraocular lens: Secondary | ICD-10-CM | POA: Diagnosis not present

## 2016-01-19 ENCOUNTER — Ambulatory Visit
Admission: RE | Admit: 2016-01-19 | Discharge: 2016-01-19 | Disposition: A | Discharge status: Home / Self Care | Payer: Medicare Other | Source: Ambulatory Visit

## 2016-01-19 DIAGNOSIS — Z1231 Encounter for screening mammogram for malignant neoplasm of breast: Secondary | ICD-10-CM | POA: Diagnosis not present

## 2016-02-19 DIAGNOSIS — J309 Allergic rhinitis, unspecified: Secondary | ICD-10-CM | POA: Diagnosis not present

## 2016-02-19 DIAGNOSIS — H9202 Otalgia, left ear: Secondary | ICD-10-CM | POA: Diagnosis not present

## 2016-03-22 DIAGNOSIS — R220 Localized swelling, mass and lump, head: Secondary | ICD-10-CM | POA: Diagnosis not present

## 2016-03-22 DIAGNOSIS — R609 Edema, unspecified: Secondary | ICD-10-CM | POA: Insufficient documentation

## 2016-03-23 DIAGNOSIS — Z79899 Other long term (current) drug therapy: Secondary | ICD-10-CM | POA: Diagnosis not present

## 2016-03-25 DIAGNOSIS — J309 Allergic rhinitis, unspecified: Secondary | ICD-10-CM | POA: Diagnosis not present

## 2016-03-25 DIAGNOSIS — M25511 Pain in right shoulder: Secondary | ICD-10-CM | POA: Diagnosis not present

## 2016-03-25 DIAGNOSIS — H1089 Other conjunctivitis: Secondary | ICD-10-CM | POA: Diagnosis not present

## 2016-03-25 DIAGNOSIS — R05 Cough: Secondary | ICD-10-CM | POA: Diagnosis not present

## 2016-03-25 DIAGNOSIS — R062 Wheezing: Secondary | ICD-10-CM | POA: Diagnosis not present

## 2016-03-26 ENCOUNTER — Encounter (HOSPITAL_COMMUNITY): Payer: Self-pay | Admitting: Emergency Medicine

## 2016-03-26 ENCOUNTER — Emergency Department (HOSPITAL_COMMUNITY)
Admission: EM | Admit: 2016-03-26 | Discharge: 2016-03-26 | Disposition: A | Discharge status: Home / Self Care | Payer: Medicare Other | Attending: Emergency Medicine | Admitting: Emergency Medicine

## 2016-03-26 DIAGNOSIS — Z8719 Personal history of other diseases of the digestive system: Secondary | ICD-10-CM | POA: Insufficient documentation

## 2016-03-26 DIAGNOSIS — H9203 Otalgia, bilateral: Secondary | ICD-10-CM | POA: Diagnosis not present

## 2016-03-26 DIAGNOSIS — H109 Unspecified conjunctivitis: Secondary | ICD-10-CM | POA: Diagnosis not present

## 2016-03-26 DIAGNOSIS — E039 Hypothyroidism, unspecified: Secondary | ICD-10-CM | POA: Diagnosis not present

## 2016-03-26 DIAGNOSIS — Z87891 Personal history of nicotine dependence: Secondary | ICD-10-CM | POA: Insufficient documentation

## 2016-03-26 DIAGNOSIS — F419 Anxiety disorder, unspecified: Secondary | ICD-10-CM | POA: Insufficient documentation

## 2016-03-26 DIAGNOSIS — G8929 Other chronic pain: Secondary | ICD-10-CM | POA: Diagnosis not present

## 2016-03-26 DIAGNOSIS — R05 Cough: Secondary | ICD-10-CM | POA: Diagnosis not present

## 2016-03-26 DIAGNOSIS — F319 Bipolar disorder, unspecified: Secondary | ICD-10-CM | POA: Insufficient documentation

## 2016-03-26 DIAGNOSIS — J069 Acute upper respiratory infection, unspecified: Secondary | ICD-10-CM

## 2016-03-26 DIAGNOSIS — Z79899 Other long term (current) drug therapy: Secondary | ICD-10-CM | POA: Diagnosis not present

## 2016-03-26 MED ORDER — OXYMETAZOLINE HCL 0.05 % NA SOLN: 1.0000 | Freq: Two times a day (BID) | NASAL | Status: DC

## 2016-03-26 NOTE — Discharge Instructions (Signed)
You may use your nose spray twice daily for up to 3 days. Refrain from using your other prescribed no spray while using Afrin. I recommend continuing to use your polymyxin eyedrops as prescribed. You may take Tylenol and/or ibuprofen as prescribed over-the-counter as needed for pain control. I also recommend drinking warm water or tea with honey or using throat lozenges to help with your sore throat. Follow-up with your primary care provider in 3 days. Return to the emergency department if symptoms worsen or new onset of fever, neck stiffness, unable to swallow resulting in drooling, coughing up blood, difficulty breathing, chest pain, abdominal pain, vomiting, diarrhea.

## 2016-03-26 NOTE — ED Provider Notes (Signed)
CSN: [id number]     Arrival date & time 03/26/16  8469 History  By signing my name below, I, Essence Howell, attest that this documentation has been prepared under the direction and in the presence of Melburn Hake, PA-C Electronically Signed: Charline Bills, ED Scribe 03/26/2016 at 9:41 AM.   Chief Complaint  Patient presents with  . Cough  . Sore Throat   The history is provided by the patient. No language interpreter was used.   HPI Comments: LINET BRASH is a 65 y.o. female who presents to the Emergency Department complaining of gradually worsening sore throat for the past 2 days. Pt reports increased pain with swallowing and coughing. She reports associated symptoms of productive cough with yellow sputum, nasal congestion, rhinorrhea, bilateral ear pain, white eye drainage, throbbing HA that feels similar to past HAs. Pt was seen by her PCP yesterday, dx with cold and conjunctivitis, and prescribed polymixin eye drops, mometasone furoate nasal spray, albuterol inhaler. Patient reports she has been using her medications without any improvement. She denies fever, visual changes, photophobia, difficulty swallowing, sneezing, watery eyes, chest pain, SOB, wheezing abdominal pain, nausea, vomiting, diarrhea. No sick contacts.   Past Medical History  Diagnosis Date  . Anxiety   . Hypothyroidism   . Bipolar disorder (HCC)   . History of indigestion   . Chronic back pain    Past Surgical History  Procedure Laterality Date  . Joint replacement      bilat knee  . Carpel tunnel    . Tonsillectomy    . Total knee revision  05/01/2012    Procedure: TOTAL KNEE REVISION;  Surgeon: Raymon Mutton, MD;  Location: [facility] OR;  Service: Orthopedics;  Laterality: Right;  . Esophagogastroduodenoscopy N/A 11/06/2014    Procedure: ESOPHAGOGASTRODUODENOSCOPY (EGD);  Surgeon: Charna Elizabeth, MD;  Location: [facility] ENDOSCOPY;  Service: Endoscopy;  Laterality: N/A;   No family history on file. Social History   Substance Use Topics  . Smoking status: Former Games developer  . Smokeless tobacco: None  . Alcohol Use: No   OB History    No data available     Review of Systems  HENT: Positive for congestion, ear pain, rhinorrhea and sore throat. Negative for sneezing and trouble swallowing.   Eyes: Positive for discharge.  Respiratory: Positive for cough.   Cardiovascular: Negative for chest pain.  Gastrointestinal: Negative for nausea, vomiting, abdominal pain and diarrhea.  Neurological: Positive for headaches.   Allergies  Review of patient's allergies indicates no known allergies.  Home Medications   Prior to Admission medications   Medication Sig Start Date End Date Taking? Authorizing Provider  albuterol (PROVENTIL HFA;VENTOLIN HFA) 108 (90 Base) MCG/ACT inhaler Inhale 2 puffs into the lungs every 4 (four) hours as needed for wheezing or shortness of breath. 11/21/15   Tharon Aquas, PA  azithromycin (ZITHROMAX) 250 MG tablet Take first 2 tablets together, then 1 every day until finished. 11/21/15   Tharon Aquas, PA  bumetanide (BUMEX) 1 MG tablet Take 1 tablet (1 mg total) by mouth daily. 09/16/15   Charm Rings, MD  calcium citrate (CALCITRATE - DOSED IN MG ELEMENTAL CALCIUM) 950 MG tablet Take 200 mg of elemental calcium by mouth daily.    Historical Provider, MD  Cholecalciferol (VITAMIN D) 2000 UNITS CAPS Take 1 capsule by mouth daily.    Historical Provider, MD  cyclobenzaprine (FLEXERIL) 10 MG tablet Take 1 tablet (10 mg total) by mouth 2 (two) times daily as needed for  muscle spasms. 11/02/15   Roxy Horsemanobert Browning, PA-C  divalproex (DEPAKOTE ER) 250 MG 24 hr tablet Take 750 mg by mouth at bedtime.     Historical Provider, MD  HYDROcodone-acetaminophen (NORCO/VICODIN) 5-325 MG tablet Take 1 tablet by mouth every 6 (six) hours as needed. 11/02/15   Roxy Horsemanobert Browning, PA-C  ibuprofen (ADVIL,MOTRIN) 200 MG tablet Take 400 mg by mouth every 6 (six) hours as needed for moderate pain.     Historical Provider, MD  ketotifen (ZADITOR) 0.025 % ophthalmic solution Place 1 drop into both eyes 2 (two) times daily.    Historical Provider, MD  levothyroxine (SYNTHROID, LEVOTHROID) 112 MCG tablet Take 112 mcg by mouth daily before breakfast.    Historical Provider, MD  LIDOCAINE HCL, PF, IJ Inject as directed. Gets at each PT appointment    Historical Provider, MD  lithium carbonate 300 MG capsule Take 300 mg by mouth at bedtime.    Historical Provider, MD  LORazepam (ATIVAN) 0.5 MG tablet Take 1 tablet (0.5 mg total) by mouth every 6 (six) hours as needed for anxiety (for acute aggitation and to help sleep). 06/04/15   Earney NavyJosephine C Onuoha, NP  omeprazole (PRILOSEC) 20 MG capsule Take 1 capsule (20 mg total) by mouth daily. 09/16/15   Charm RingsErin J Honig, MD  oxymetazoline (AFRIN NASAL SPRAY) 0.05 % nasal spray Place 1 spray into both nostrils 2 (two) times daily. 03/26/16   Barrett HenleNicole Elizabeth Nadeau, PA-C  predniSONE (DELTASONE) 10 MG tablet Take 4 tablets (40 mg total) by mouth once. 11/21/15   Tharon AquasFrank C Patrick, PA  sertraline (ZOLOFT) 50 MG tablet Take 50 mg by mouth daily as needed (for depression and anxiety).    Historical Provider, MD   BP 122/64 mmHg  Pulse 94  Temp(Src) 98.9 F (37.2 C) (Oral)  Resp 24  SpO2 95% Physical Exam  Constitutional: She is oriented to person, place, and time. She appears well-developed and well-nourished. No distress.  HENT:  Head: Normocephalic and atraumatic.  Right Ear: Tympanic membrane normal.  Left Ear: Tympanic membrane normal.  Nose: Rhinorrhea present. Right sinus exhibits no maxillary sinus tenderness and no frontal sinus tenderness. Left sinus exhibits no maxillary sinus tenderness and no frontal sinus tenderness.  Mouth/Throat: Uvula is midline, oropharynx is clear and moist and mucous membranes are normal. No trismus in the jaw. No uvula swelling. No oropharyngeal exudate, posterior oropharyngeal edema, posterior oropharyngeal erythema or tonsillar  abscesses.  No facial or neck swelling  Eyes: EOM are normal. Pupils are equal, round, and reactive to light. Right eye exhibits discharge (purulent). Left eye exhibits no discharge. Right conjunctiva is injected. Left conjunctiva is injected. No scleral icterus.  Neck: Normal range of motion. Neck supple.  Cardiovascular: Normal rate, regular rhythm, normal heart sounds and intact distal pulses.   Pulmonary/Chest: Effort normal and breath sounds normal. No stridor. No respiratory distress. She has no wheezes. She has no rales. She exhibits no tenderness.  Abdominal: Soft. She exhibits no distension.  Musculoskeletal: Normal range of motion.  Lymphadenopathy:    She has no cervical adenopathy.  Neurological: She is alert and oriented to person, place, and time.  Skin: Skin is warm and dry.  Psychiatric: She has a normal mood and affect. Her behavior is normal.  Nursing note and vitals reviewed.  ED Course  Procedures (including critical care time) DIAGNOSTIC STUDIES: Oxygen Saturation is 95% on RA, normal by my interpretation.    COORDINATION OF CARE: 9:21 AM-Discussed treatment plan which includes Afrin nasal spray  with pt at bedside and pt agreed to plan.   Labs Review Labs Reviewed - No data to display  Imaging Review No results found.   EKG Interpretation None      MDM   Final diagnoses:  URI (upper respiratory infection)  Bilateral conjunctivitis   Patients symptoms are consistent with URI, likely viral etiology. Presentation non concerning for PTA or infxn spread to soft tissue. No trismus, stridor or uvula deviation. Discussed that antibiotics are not indicated for viral infections. Pt will be discharged with symptomatic treatment.  I advised patient to continue using her polymyxin eyedrops to treat her bacterial conjunctivitis. Advised patient to follow up with her PCP in 3 days if her symptoms have not improved. Discussed return precautions with patient. Verbalizes  understanding and is agreeable with plan. Pt is hemodynamically stable & in NAD prior to dc.  I personally performed the services described in this documentation, which was scribed in my presence. The recorded information has been reviewed and is accurate.    Satira Sark El[place], [place] 03/26/16 1110  Derwood Kaplan, MD 03/27/16 1725

## 2016-03-26 NOTE — ED Notes (Signed)
Patient states sore throat x 2 days.   Patient states cough with yellow sputum.   Patient states bilateral ear pain.  Patient states went to doctor yesterday and they gave her an inhaler, eye drops, ear drops, nose spray.   Patient states "they dont work".  Patient just started using yesterday.

## 2016-03-28 ENCOUNTER — Emergency Department (HOSPITAL_COMMUNITY)
Admission: EM | Admit: 2016-03-28 | Discharge: 2016-03-28 | Disposition: A | Discharge status: Home / Self Care | Payer: Medicare Other | Attending: Emergency Medicine | Admitting: Emergency Medicine

## 2016-03-28 ENCOUNTER — Encounter (HOSPITAL_COMMUNITY): Payer: Self-pay

## 2016-03-28 DIAGNOSIS — J069 Acute upper respiratory infection, unspecified: Secondary | ICD-10-CM | POA: Insufficient documentation

## 2016-03-28 DIAGNOSIS — E039 Hypothyroidism, unspecified: Secondary | ICD-10-CM | POA: Diagnosis not present

## 2016-03-28 DIAGNOSIS — G8929 Other chronic pain: Secondary | ICD-10-CM | POA: Insufficient documentation

## 2016-03-28 DIAGNOSIS — Z79899 Other long term (current) drug therapy: Secondary | ICD-10-CM | POA: Diagnosis not present

## 2016-03-28 DIAGNOSIS — Z792 Long term (current) use of antibiotics: Secondary | ICD-10-CM | POA: Insufficient documentation

## 2016-03-28 DIAGNOSIS — F319 Bipolar disorder, unspecified: Secondary | ICD-10-CM | POA: Diagnosis not present

## 2016-03-28 DIAGNOSIS — Z87891 Personal history of nicotine dependence: Secondary | ICD-10-CM | POA: Diagnosis not present

## 2016-03-28 DIAGNOSIS — F419 Anxiety disorder, unspecified: Secondary | ICD-10-CM | POA: Diagnosis not present

## 2016-03-28 DIAGNOSIS — H9203 Otalgia, bilateral: Secondary | ICD-10-CM | POA: Diagnosis not present

## 2016-03-28 DIAGNOSIS — R51 Headache: Secondary | ICD-10-CM | POA: Diagnosis present

## 2016-03-28 LAB — RAPID STREP SCREEN (MED CTR MEBANE ONLY): STREPTOCOCCUS, GROUP A SCREEN (DIRECT): NEGATIVE

## 2016-03-28 MED ORDER — TRAMADOL HCL 50 MG PO TABS: 50.0000 mg | ORAL_TABLET | Freq: Four times a day (QID) | ORAL | Status: DC | PRN

## 2016-03-28 MED ORDER — AZITHROMYCIN 250 MG PO TABS: 250.0000 mg | ORAL_TABLET | Freq: Every day | ORAL | Status: DC

## 2016-03-28 MED ORDER — AZITHROMYCIN 250 MG PO TABS
500.0000 mg | ORAL_TABLET | Freq: Once | ORAL | Status: AC
  Administered 2016-03-28: 500 mg via ORAL
  Filled 2016-03-28: qty 2

## 2016-03-28 MED ORDER — HYDROCODONE-ACETAMINOPHEN 7.5-325 MG/15ML PO SOLN
5.0000 mL | Freq: Once | ORAL | Status: AC
  Administered 2016-03-28: 5 mL via ORAL
  Filled 2016-03-28: qty 15

## 2016-03-28 NOTE — ED Notes (Signed)
Pt here for sore throat x 1 week, seen here for same and no relief with medications given. Also reports green sputum, headache, nose bleeds

## 2016-03-28 NOTE — Discharge Instructions (Signed)
Upper Respiratory Infection, Adult °Most upper respiratory infections (URIs) are caused by a virus. A URI affects the nose, throat, and upper air passages. The most common type of URI is often called "the common cold." °HOME CARE  °· Take medicines only as told by your doctor. °· Gargle warm saltwater or take cough drops to comfort your throat as told by your doctor. °· Use a warm mist humidifier or inhale steam from a shower to increase air moisture. This may make it easier to breathe. °· Drink enough fluid to keep your pee (urine) clear or pale yellow. °· Eat soups and other clear broths. °· Have a healthy diet. °· Rest as needed. °· Go back to work when your fever is gone or your doctor says it is okay. °¨ You may need to stay home longer to avoid giving your URI to others. °¨ You can also wear a face mask and wash your hands often to prevent spread of the virus. °· Use your inhaler more if you have asthma. °· Do not use any tobacco products, including cigarettes, chewing tobacco, or electronic cigarettes. If you need help quitting, ask your doctor. °GET HELP IF: °· You are getting worse, not better. °· Your symptoms are not helped by medicine. °· You have chills. °· You are getting more short of breath. °· You have brown or red mucus. °· You have yellow or brown discharge from your nose. °· You have pain in your face, especially when you bend forward. °· You have a fever. °· You have puffy (swollen) neck glands. °· You have pain while swallowing. °· You have white areas in the back of your throat. °GET HELP RIGHT AWAY IF:  °· You have very bad or constant: °· Headache. °· Ear pain. °· Pain in your forehead, behind your eyes, and over your cheekbones (sinus pain). °· Chest pain. °· You have long-lasting (chronic) lung disease and any of the following: °· Wheezing. °· Long-lasting cough. °· Coughing up blood. °· A change in your usual mucus. °· You have a stiff neck. °· You have changes in  your: °· Vision. °· Hearing. °· Thinking. °· Mood. °MAKE SURE YOU:  °· Understand these instructions. °· Will watch your condition. °· Will get help right away if you are not doing well or get worse. °  °This information is not intended to replace advice given to you by your health care provider. Make sure you discuss any questions you have with your health care provider. °  °Document Released: 04/26/2008 Document Revised: 03/25/2015 Document Reviewed: 02/13/2014 °Elsevier Interactive Patient Education ©2016 Elsevier Inc. ° °Pharyngitis °Pharyngitis is a sore throat (pharynx). There is redness, pain, and swelling of your throat. °HOME CARE  °· Drink enough fluids to keep your pee (urine) clear or pale yellow. °· Only take medicine as told by your doctor. °¨ You may get sick again if you do not take medicine as told. Finish your medicines, even if you start to feel better. °¨ Do not take aspirin. °· Rest. °· Rinse your mouth (gargle) with salt water (½ tsp of salt per 1 qt of water) every 1-2 hours. This will help the pain. °· If you are not at risk for choking, you can suck on hard candy or sore throat lozenges. °GET HELP IF: °· You have large, tender lumps on your neck. °· You have a rash. °· You cough up green, yellow-brown, or bloody spit. °GET HELP RIGHT AWAY IF:  °· You have   a stiff neck. °· You drool or cannot swallow liquids. °· You throw up (vomit) or are not able to keep medicine or liquids down. °· You have very bad pain that does not go away with medicine. °· You have problems breathing (not from a stuffy nose). °MAKE SURE YOU:  °· Understand these instructions. °· Will watch your condition. °· Will get help right away if you are not doing well or get worse. °  °This information is not intended to replace advice given to you by your health care provider. Make sure you discuss any questions you have with your health care provider. °  °Document Released: 04/26/2008 Document Revised: 08/29/2013 Document  Reviewed: 07/16/2013 °Elsevier Interactive Patient Education ©2016 Elsevier Inc. ° °

## 2016-03-28 NOTE — ED Provider Notes (Signed)
CSN: [id number]     Arrival date & time 03/28/16  0307 History   First MD Initiated Contact with Patient 03/28/16 0315     Chief Complaint  Patient presents with  . Sore Throat  . Headache     (Consider location/radiation/quality/duration/timing/severity/associated sxs/prior Treatment) HPI   Patient with PMH of anxiety, hypothyroidism, bipolar disorder, and chronic back returns to the ER for a second day in a row for sore throat, coughing productive with yellow sputum, nasal congestion, rhinorrhea, bilateral ear pain and headache.  The patient is tearful and says she was given tylenol and Afrin for her symptoms but they are not helping and she has been unable to sleep this morning.  She was seen by her PCP two days ago who gave her polymixin eyedrops and albuterol inhaler which has helped her breathing.   PCP: Gwynneth Aliment, MD Brenda Murray is a 65 y.o.  female  ROS: The patient denies diaphoresis, fever, weakness (general or focal), confusion, change of vision,  dysphagia, aphagia, shortness of breath,  abdominal pains, nausea, vomiting, diarrhea, lower extremity swelling, rash, neck pain, chest pain   Past Medical History  Diagnosis Date  . Anxiety   . Hypothyroidism   . Bipolar disorder (HCC)   . History of indigestion   . Chronic back pain    Past Surgical History  Procedure Laterality Date  . Joint replacement      bilat knee  . Carpel tunnel    . Tonsillectomy    . Total knee revision  05/01/2012    Procedure: TOTAL KNEE REVISION;  Surgeon: Raymon Mutton, MD;  Location: [facility] OR;  Service: Orthopedics;  Laterality: Right;  . Esophagogastroduodenoscopy N/A 11/06/2014    Procedure: ESOPHAGOGASTRODUODENOSCOPY (EGD);  Surgeon: Charna Elizabeth, MD;  Location: [facility] ENDOSCOPY;  Service: Endoscopy;  Laterality: N/A;   History reviewed. No pertinent family history. Social History  Substance Use Topics  . Smoking status: Former Games developer  . Smokeless tobacco: None  . Alcohol Use: No    OB History    No data available     Review of Systems  Review of Systems All other systems negative except as documented in the HPI. All pertinent positives and negatives as reviewed in the HPI.   Allergies  Review of patient's allergies indicates no known allergies.  Home Medications   Prior to Admission medications   Medication Sig Start Date End Date Taking? Authorizing Provider  albuterol (PROVENTIL HFA;VENTOLIN HFA) 108 (90 Base) MCG/ACT inhaler Inhale 2 puffs into the lungs every 4 (four) hours as needed for wheezing or shortness of breath. 11/21/15   Tharon Aquas, PA  azithromycin (ZITHROMAX) 250 MG tablet Take first 2 tablets together, then 1 every day until finished. 11/21/15   Tharon Aquas, PA  azithromycin (ZITHROMAX) 250 MG tablet Take 1 tablet (250 mg total) by mouth daily. 1 tab every day until finished. 03/29/16   Montavious Wierzba Neva Seat, PA-C  bumetanide (BUMEX) 1 MG tablet Take 1 tablet (1 mg total) by mouth daily. 09/16/15   Charm Rings, MD  calcium citrate (CALCITRATE - DOSED IN MG ELEMENTAL CALCIUM) 950 MG tablet Take 200 mg of elemental calcium by mouth daily.    Historical Provider, MD  Cholecalciferol (VITAMIN D) 2000 UNITS CAPS Take 1 capsule by mouth daily.    Historical Provider, MD  cyclobenzaprine (FLEXERIL) 10 MG tablet Take 1 tablet (10 mg total) by mouth 2 (two) times daily as needed for muscle spasms. 11/02/15   Molly Maduro  Browning, PA-C  divalproex (DEPAKOTE ER) 250 MG 24 hr tablet Take 750 mg by mouth at bedtime.     Historical Provider, MD  HYDROcodone-acetaminophen (NORCO/VICODIN) 5-325 MG tablet Take 1 tablet by mouth every 6 (six) hours as needed. 11/02/15   Roxy Horseman, PA-C  ibuprofen (ADVIL,MOTRIN) 200 MG tablet Take 400 mg by mouth every 6 (six) hours as needed for moderate pain.    Historical Provider, MD  ketotifen (ZADITOR) 0.025 % ophthalmic solution Place 1 drop into both eyes 2 (two) times daily.    Historical Provider, MD  levothyroxine  (SYNTHROID, LEVOTHROID) 112 MCG tablet Take 112 mcg by mouth daily before breakfast.    Historical Provider, MD  LIDOCAINE HCL, PF, IJ Inject as directed. Gets at each PT appointment    Historical Provider, MD  lithium carbonate 300 MG capsule Take 300 mg by mouth at bedtime.    Historical Provider, MD  LORazepam (ATIVAN) 0.5 MG tablet Take 1 tablet (0.5 mg total) by mouth every 6 (six) hours as needed for anxiety (for acute aggitation and to help sleep). 06/04/15   Earney Navy, NP  omeprazole (PRILOSEC) 20 MG capsule Take 1 capsule (20 mg total) by mouth daily. 09/16/15   Charm Rings, MD  oxymetazoline (AFRIN NASAL SPRAY) 0.05 % nasal spray Place 1 spray into both nostrils 2 (two) times daily. 03/26/16   Barrett Henle, PA-C  predniSONE (DELTASONE) 10 MG tablet Take 4 tablets (40 mg total) by mouth once. 11/21/15   Tharon Aquas, PA  sertraline (ZOLOFT) 50 MG tablet Take 50 mg by mouth daily as needed (for depression and anxiety).    Historical Provider, MD  traMADol (ULTRAM) 50 MG tablet Take 1 tablet (50 mg total) by mouth every 6 (six) hours as needed. 03/28/16   Amear Strojny Neva Seat, PA-C   BP 138/57 mmHg  Pulse 93  Temp(Src) 98.2 F (36.8 C) (Oral)  Resp 20  Wt 87.091 kg  SpO2 99% Physical Exam  Constitutional: She is oriented to person, place, and time. She appears well-developed and well-nourished. No distress.  HENT:  Head: Normocephalic and atraumatic.  Right Ear: Tympanic membrane normal.  Left Ear: Tympanic membrane normal.  Nose: Rhinorrhea present. Right sinus exhibits no maxillary sinus tenderness and no frontal sinus tenderness. Left sinus exhibits no maxillary sinus tenderness and no frontal sinus tenderness.  Mouth/Throat: Uvula is midline, oropharynx is clear and moist and mucous membranes are normal. No trismus in the jaw. No uvula swelling. No oropharyngeal exudate, posterior oropharyngeal edema, posterior oropharyngeal erythema or tonsillar abscesses.  Eyes:  EOM are normal. Pupils are equal, round, and reactive to light Neck: Normal range of motion. Neck supple.  Cardiovascular: Normal rate, regular rhythm, normal heart sounds and intact distal pulses.  Pulmonary/Chest: Effort normal and breath sounds normal. No stridor. No respiratory distress. She has no wheezes. She has no rales. She exhibits no tenderness. Patient coughs during exam and produces yellow thick sputum. Abdominal: Soft. She exhibits no distension.  Musculoskeletal: Normal range of motion.  Lymphadenopathy:   She has no cervical adenopathy.  Neurological: She is alert and oriented to person, place, and time.  Skin: Skin is warm and dry.  Psychiatric: She has a normal mood and affect. Her behavior is normal.  Nursing note and vitals reviewed.   ED Course  Procedures (including critical care time) Labs Review Labs Reviewed  RAPID STREP SCREEN (NOT AT [facility])  CULTURE, GROUP A STREP [facility])    Imaging Review No results  found. I have personally reviewed and evaluated these images and lab results as part of my medical decision-making.   EKG Interpretation None      MDM   Final diagnoses:  URI (upper respiratory infection)    Case discussed with Dr. Patria Maneampos, will give Z-pack. Pt also given pain medication to help with sore throat, neg strep screen. Pt is well and non toxic appearing.  Rx: ultram # 5 tabs and Azithromycin. Medications  azithromycin (ZITHROMAX) tablet 500 mg (500 mg Oral Given 03/28/16 0420)  HYDROcodone-acetaminophen (HYCET) 7.5-325 mg/15 ml solution 5 mL (5 mLs Oral Given 03/28/16 0421)   Filed Vitals:   03/28/16 0319  BP: 138/57  Pulse: 93  Temp: 98.2 F (36.8 C)  Resp: 20    Discussed return precautions.  Pt is hemodynamically stable & in NAD prior to discharge.    Marlon Peliffany Sanah [place], PA-C 03/28/16 0430  Azalia BilisKevin Campos, MD 03/28/16 [phone]

## 2016-03-30 LAB — CULTURE, GROUP A STREP (THRC)

## 2016-05-05 DIAGNOSIS — M79601 Pain in right arm: Secondary | ICD-10-CM | POA: Diagnosis not present

## 2016-05-05 DIAGNOSIS — E041 Nontoxic single thyroid nodule: Secondary | ICD-10-CM | POA: Diagnosis not present

## 2016-05-05 DIAGNOSIS — R1310 Dysphagia, unspecified: Secondary | ICD-10-CM | POA: Diagnosis not present

## 2016-05-12 ENCOUNTER — Other Ambulatory Visit: Payer: Self-pay | Admitting: Internal Medicine

## 2016-05-12 DIAGNOSIS — E041 Nontoxic single thyroid nodule: Secondary | ICD-10-CM

## 2016-06-14 ENCOUNTER — Ambulatory Visit (HOSPITAL_COMMUNITY)
Admission: EM | Admit: 2016-06-14 | Discharge: 2016-06-14 | Disposition: A | Discharge status: Home / Self Care | Payer: Medicare Other | Attending: Emergency Medicine | Admitting: Emergency Medicine

## 2016-06-14 ENCOUNTER — Encounter (HOSPITAL_COMMUNITY): Payer: Self-pay | Admitting: Emergency Medicine

## 2016-06-14 DIAGNOSIS — M7581 Other shoulder lesions, right shoulder: Secondary | ICD-10-CM

## 2016-06-14 DIAGNOSIS — M778 Other enthesopathies, not elsewhere classified: Secondary | ICD-10-CM

## 2016-06-14 MED ORDER — HYDROCODONE-ACETAMINOPHEN 5-325 MG PO TABS: 1.0000 | ORAL_TABLET | Freq: Four times a day (QID) | ORAL | 0 refills | Status: DC | PRN

## 2016-06-14 MED ORDER — PREDNISONE 50 MG PO TABS: ORAL_TABLET | ORAL | 0 refills | Status: DC

## 2016-06-14 MED ORDER — MELOXICAM 7.5 MG PO TABS: 7.5000 mg | ORAL_TABLET | Freq: Every day | ORAL | 0 refills | Status: DC

## 2016-06-14 NOTE — Discharge Instructions (Signed)
You have tendinitis in your shoulder and wrist. Take prednisone daily for 5 days. Take meloxicam daily for 5 days, then as needed for pain. Make sure you take these medicines with food. Use the hydrocodone every 4-6 hours as needed for severe pain. This medicine will make you drowsy. Please make an appointment to see your primary care doctor in 1-2 weeks to see how things are going.

## 2016-06-14 NOTE — ED Triage Notes (Signed)
Patient reports movement of right arm makes pain worse. Pain involves right shoulder and right arm.  Feels like a pin sticking in upper back.  Right radial pulse is 2 plus,  Patient thinks right arm is swollen.

## 2016-06-14 NOTE — ED Provider Notes (Signed)
[facility]    CSN: [id number] Arrival date & time: 06/14/16  1451  First Provider Contact:  First MD Initiated Contact with Patient 06/14/16 1529        History   Chief Complaint Chief Complaint  Patient presents with  . Arm Pain    HPI Brenda Murray is a 65 y.o. female.   She is a 65 year old woman here for evaluation of right arm pain. She states this started about a month ago after going to the gym and doing weight lifting. She reports pain in the dorsal wrist, lateral elbow, and shoulder. She states there is some swelling at the wrist, but not anywhere else. The pain in her wrist is improving. The pain in her shoulder is what bothers her the most. It is worse with overhead movements. No weakness. She has tried taking Tylenol without improvement. She is reluctant to take over-the-counter medications as she does not want to interfere with her prescription medicines.    Past Medical History:  Diagnosis Date  . Anxiety   . Bipolar disorder (HCC)   . Chronic back pain   . History of indigestion   . Hypothyroidism     Patient Active Problem List   Diagnosis Date Noted  . Nausea and vomiting 11/04/2014  . Hypothyroid 11/04/2014  . Metabolic alkalosis 11/04/2014  . Hypokalemia 11/04/2014  . Confusion 07/11/2013  . Depressive disorder, not elsewhere classified 07/11/2013  . Anxiety 07/11/2013    Past Surgical History:  Procedure Laterality Date  . carpel tunnel    . ESOPHAGOGASTRODUODENOSCOPY N/A 11/06/2014   Procedure: ESOPHAGOGASTRODUODENOSCOPY (EGD);  Surgeon: Charna Elizabeth, MD;  Location: [facility] ENDOSCOPY;  Service: Endoscopy;  Laterality: N/A;  . JOINT REPLACEMENT     bilat knee  . TONSILLECTOMY    . TOTAL KNEE REVISION  05/01/2012   Procedure: TOTAL KNEE REVISION;  Surgeon: Raymon Mutton, MD;  Location: [facility] OR;  Service: Orthopedics;  Laterality: Right;    OB History    No data available       Home Medications    Prior to Admission medications    Medication Sig Start Date End Date Taking? Authorizing Provider  bumetanide (BUMEX) 1 MG tablet Take 1 tablet (1 mg total) by mouth daily. 09/16/15   Charm Rings, MD  calcium citrate (CALCITRATE - DOSED IN MG ELEMENTAL CALCIUM) 950 MG tablet Take 200 mg of elemental calcium by mouth daily.    Historical Provider, MD  Cholecalciferol (VITAMIN D) 2000 UNITS CAPS Take 1 capsule by mouth daily.    Historical Provider, MD  divalproex (DEPAKOTE ER) 250 MG 24 hr tablet Take 750 mg by mouth at bedtime.     Historical Provider, MD  HYDROcodone-acetaminophen (NORCO) 5-325 MG tablet Take 1 tablet by mouth every 6 (six) hours as needed for moderate pain. 06/14/16   Charm Rings, MD  ibuprofen (ADVIL,MOTRIN) 200 MG tablet Take 400 mg by mouth every 6 (six) hours as needed for moderate pain.    Historical Provider, MD  ketotifen (ZADITOR) 0.025 % ophthalmic solution Place 1 drop into both eyes 2 (two) times daily.    Historical Provider, MD  levothyroxine (SYNTHROID, LEVOTHROID) 112 MCG tablet Take 112 mcg by mouth daily before breakfast.    Historical Provider, MD  LIDOCAINE HCL, PF, IJ Inject as directed. Gets at each PT appointment    Historical Provider, MD  lithium carbonate 300 MG capsule Take 300 mg by mouth at bedtime.    Historical Provider, MD  LORazepam (ATIVAN) 0.5 MG tablet Take 1 tablet (0.5 mg total) by mouth every 6 (six) hours as needed for anxiety (for acute aggitation and to help sleep). 06/04/15   Earney Navy, NP  meloxicam (MOBIC) 7.5 MG tablet Take 1 tablet (7.5 mg total) by mouth daily. 06/14/16   Charm Rings, MD  omeprazole (PRILOSEC) 20 MG capsule Take 1 capsule (20 mg total) by mouth daily. 09/16/15   Charm Rings, MD  oxymetazoline (AFRIN NASAL SPRAY) 0.05 % nasal spray Place 1 spray into both nostrils 2 (two) times daily. 03/26/16   Barrett Henle, PA-C  predniSONE (DELTASONE) 50 MG tablet Take 1 pill daily for 5 days. 06/14/16   Charm Rings, MD  sertraline (ZOLOFT) 50 MG  tablet Take 50 mg by mouth daily as needed (for depression and anxiety).    Historical Provider, MD  traMADol (ULTRAM) 50 MG tablet Take 1 tablet (50 mg total) by mouth every 6 (six) hours as needed. 03/28/16   Marlon Pel, PA-C    Family History No family history on file.  Social History Social History  Substance Use Topics  . Smoking status: Former Games developer  . Smokeless tobacco: Never Used  . Alcohol use No     Allergies   Review of patient's allergies indicates no known allergies.   Review of Systems Review of Systems  Constitutional: Negative for fever.  Musculoskeletal: Positive for arthralgias and myalgias.     Physical Exam Triage Vital Signs ED Triage Vitals  Enc Vitals Group     BP 06/14/16 1513 134/85     Pulse Rate 06/14/16 1513 97     Resp 06/14/16 1513 12     Temp 06/14/16 1513 98.4 F (36.9 C)     Temp Source 06/14/16 1513 Oral     SpO2 06/14/16 1513 95 %     Weight --      Height --      Head Circumference --      Peak Flow --      Pain Score 06/14/16 1523 10     Pain Loc --      Pain Edu? --      Excl. in GC? --    No data found.   Updated Vital Signs BP 134/85 (BP Location: Left Arm)   Pulse 97   Temp 98.4 F (36.9 C) (Oral)   Resp 12   SpO2 95%   Visual Acuity Right Eye Distance:   Left Eye Distance:   Bilateral Distance:    Right Eye Near:   Left Eye Near:    Bilateral Near:     Physical Exam  Constitutional: She is oriented to person, place, and time. She appears well-developed and well-nourished. No distress.  Cardiovascular: Normal rate.   Pulmonary/Chest: Effort normal.  Musculoskeletal:  Right shoulder: No erythema or edema. Full active range of motion, but pain with overhead movements. Positive empty can. Right elbow: Full active range of motion. No erythema or edema. Mild tenderness over the lateral upper condyle. Right wrist: Very mild swelling over the dorsal aspect. Pain with resisted extension. 2+ radial pulse.    Neurological: She is alert and oriented to person, place, and time.     UC Treatments / Results  Labs (all labs ordered are listed, but only abnormal results are displayed) Labs Reviewed - No data to display  EKG  EKG Interpretation None       Radiology No results found.  Procedures Procedures (including critical  care time)  Medications Ordered in UC Medications - No data to display   Initial Impression / Assessment and Plan / UC Course  I have reviewed the triage vital signs and the nursing notes.  Pertinent labs & imaging results that were available during my care of the patient were reviewed by me and considered in my medical decision making (see chart for details).  Clinical Course    Conservative management with prednisone and meloxicam. Relative rest to allow things to heal. Hydrocodone every 4-6 hours as needed for severe pain. Follow-up with primary care doctor in 1-2 weeks for recheck and possible referral.  Final Clinical Impressions(s) / UC Diagnoses   Final diagnoses:  Rotator cuff tendonitis, right  Right wrist tendonitis    New Prescriptions New Prescriptions   HYDROCODONE-ACETAMINOPHEN (NORCO) 5-325 MG TABLET    Take 1 tablet by mouth every 6 (six) hours as needed for moderate pain.   MELOXICAM (MOBIC) 7.5 MG TABLET    Take 1 tablet (7.5 mg total) by mouth daily.   PREDNISONE (DELTASONE) 50 MG TABLET    Take 1 pill daily for 5 days.     Charm Rings, MD 06/14/16 [phone]

## 2016-07-08 DIAGNOSIS — H538 Other visual disturbances: Secondary | ICD-10-CM | POA: Diagnosis not present

## 2016-07-08 DIAGNOSIS — H04123 Dry eye syndrome of bilateral lacrimal glands: Secondary | ICD-10-CM | POA: Diagnosis not present

## 2016-07-10 IMAGING — CR DG CHEST 2V
2 series · 2 of 2 positions shown · non-contrast
Comparison: 04/26/2012

CLINICAL DATA: Extremity swelling for 2 weeks. Shortness of breath.
Back pain.

EXAM:
CHEST  2 VIEW

[w chest lat]
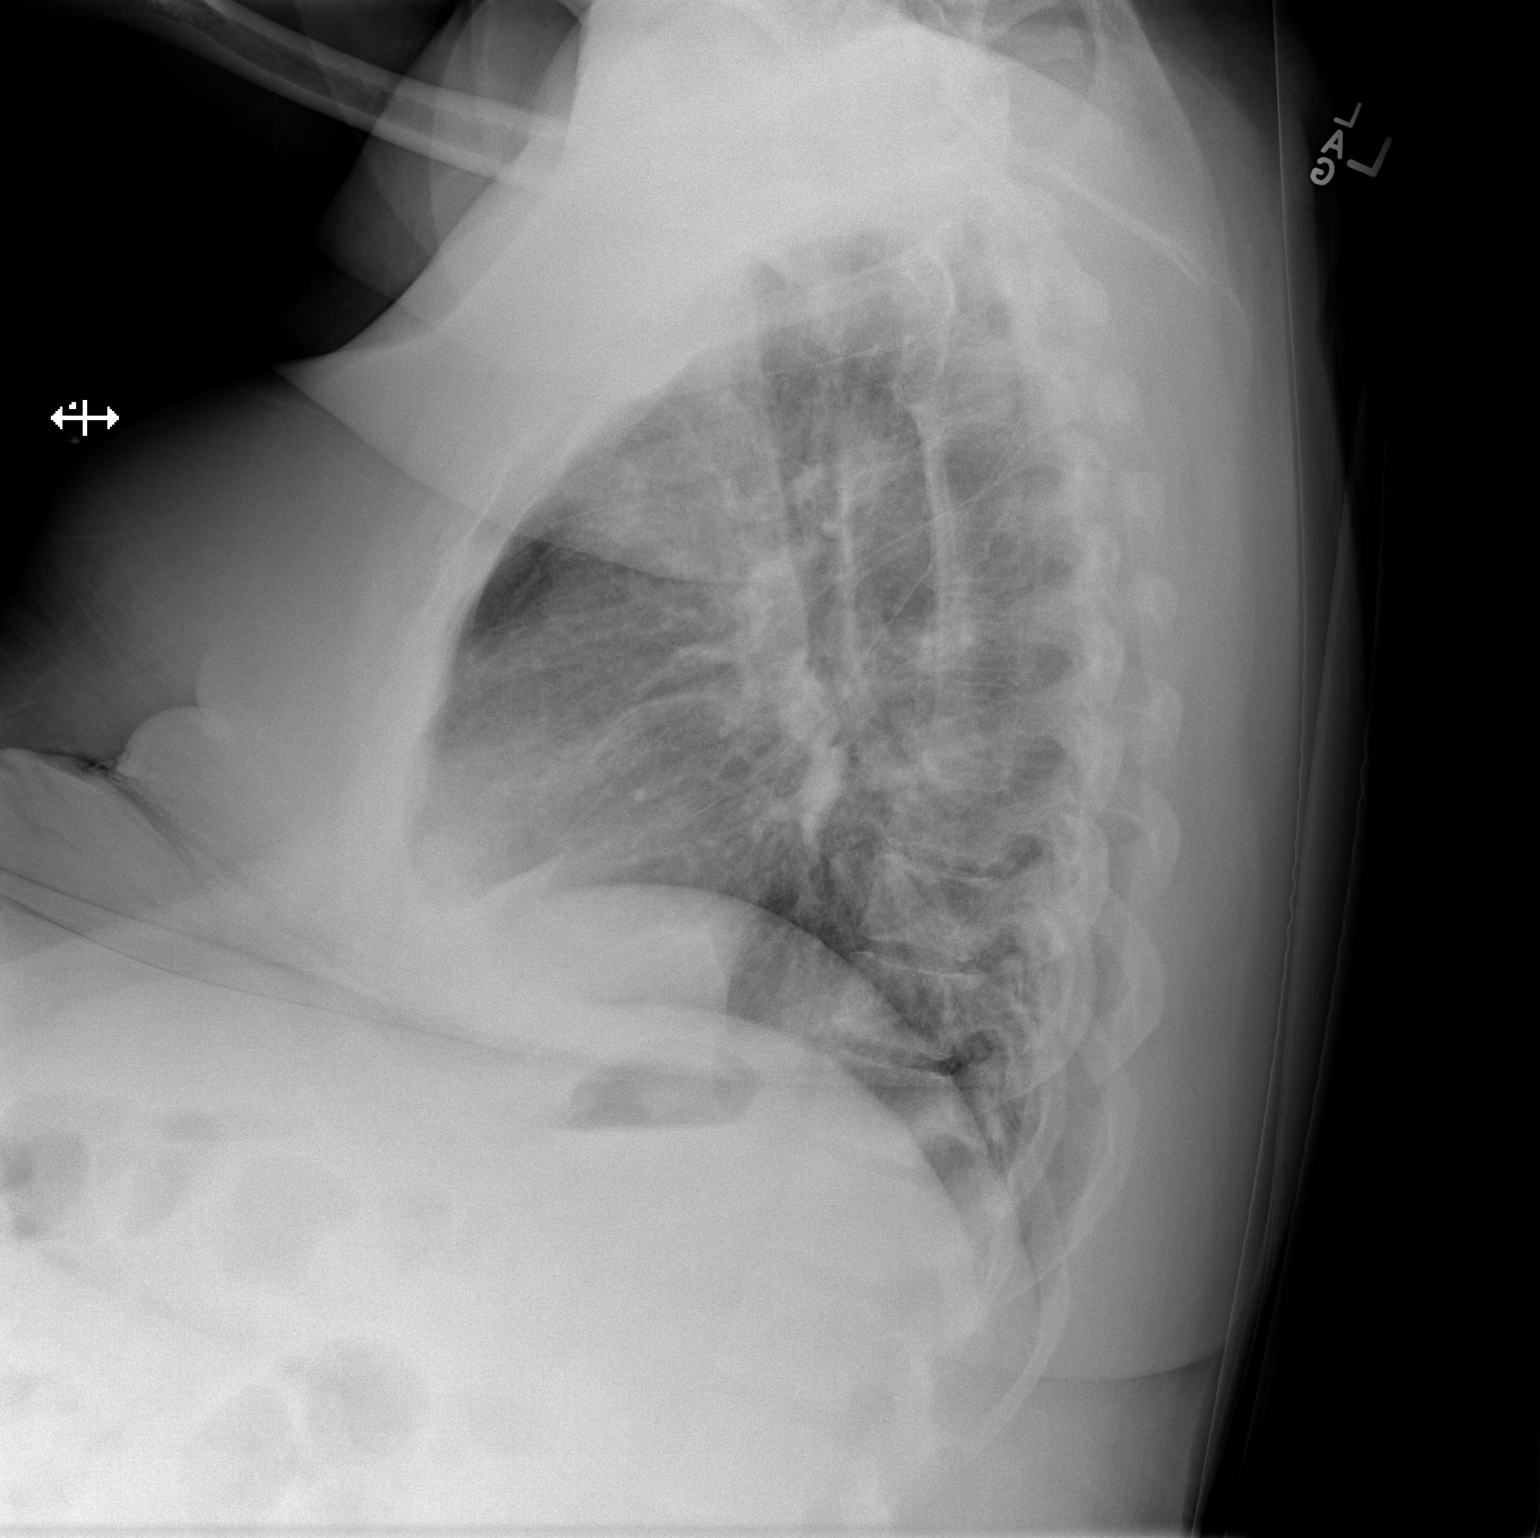

[x chest ap]
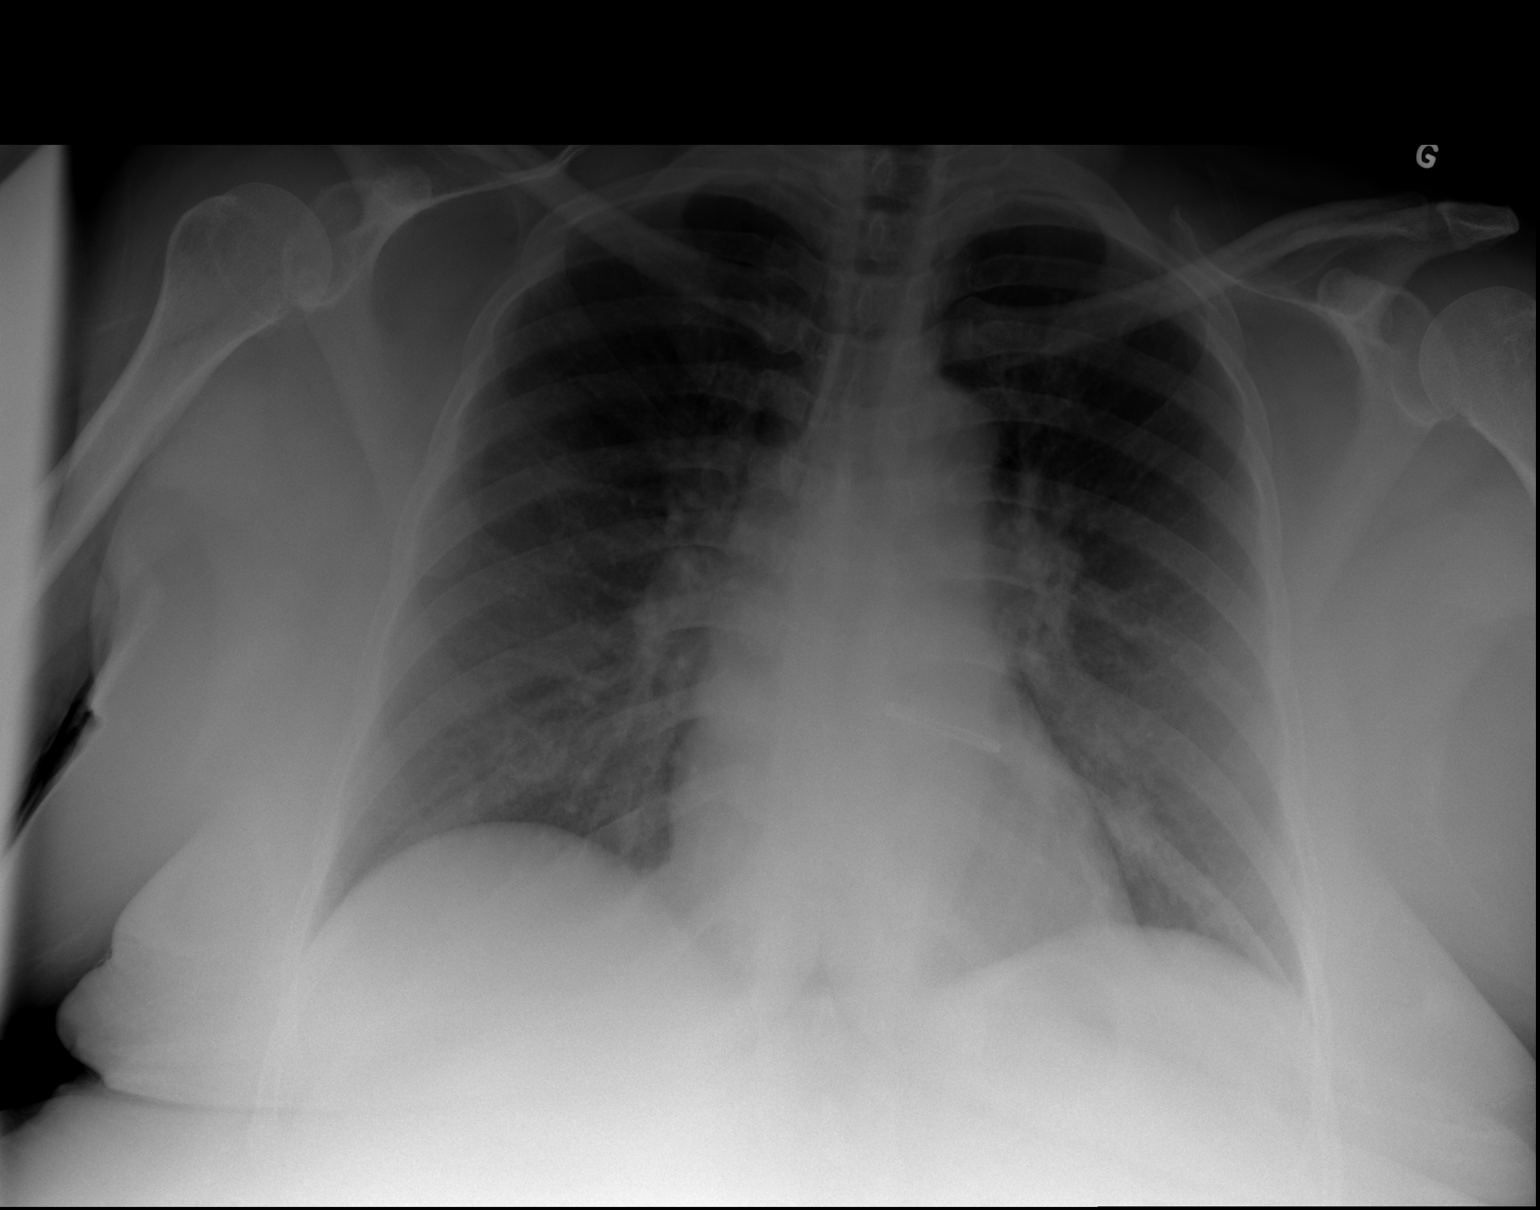

[2 of 2 positions shown; findings below may reference images not displayed]

FINDINGS: Metallic clip projected over the heart. Normal heart size and
pulmonary vascularity. Slightly shallow inspiration. No focal
airspace disease or consolidation in the lungs. Persistent patchy
infiltration vaguely demonstrated in the right mid lung and left
lung base, similar to prior study. This is likely fibrosis. No
blunting of costophrenic angles. No pneumothorax.
IMPRESSION: No active cardiopulmonary disease.

## 2016-08-23 DIAGNOSIS — H538 Other visual disturbances: Secondary | ICD-10-CM | POA: Diagnosis not present

## 2016-08-23 DIAGNOSIS — H04123 Dry eye syndrome of bilateral lacrimal glands: Secondary | ICD-10-CM | POA: Diagnosis not present

## 2016-09-06 DIAGNOSIS — R42 Dizziness and giddiness: Secondary | ICD-10-CM | POA: Diagnosis not present

## 2016-09-06 DIAGNOSIS — E782 Mixed hyperlipidemia: Secondary | ICD-10-CM | POA: Diagnosis not present

## 2016-09-06 DIAGNOSIS — E039 Hypothyroidism, unspecified: Secondary | ICD-10-CM | POA: Diagnosis not present

## 2016-09-06 DIAGNOSIS — Z23 Encounter for immunization: Secondary | ICD-10-CM | POA: Diagnosis not present

## 2016-09-06 DIAGNOSIS — R7309 Other abnormal glucose: Secondary | ICD-10-CM | POA: Diagnosis not present

## 2016-09-06 DIAGNOSIS — I10 Essential (primary) hypertension: Secondary | ICD-10-CM | POA: Diagnosis not present

## 2016-10-13 DIAGNOSIS — Z87891 Personal history of nicotine dependence: Secondary | ICD-10-CM | POA: Diagnosis not present

## 2016-10-13 DIAGNOSIS — R05 Cough: Secondary | ICD-10-CM | POA: Diagnosis not present

## 2016-10-13 DIAGNOSIS — J00 Acute nasopharyngitis [common cold]: Secondary | ICD-10-CM | POA: Diagnosis not present

## 2016-10-18 ENCOUNTER — Emergency Department (HOSPITAL_COMMUNITY)
Admission: EM | Admit: 2016-10-18 | Discharge: 2016-10-18 | Disposition: A | Discharge status: Home / Self Care | Payer: Medicare Other | Attending: Emergency Medicine | Admitting: Emergency Medicine

## 2016-10-18 ENCOUNTER — Encounter (HOSPITAL_COMMUNITY): Payer: Self-pay | Admitting: *Deleted

## 2016-10-18 ENCOUNTER — Emergency Department (HOSPITAL_COMMUNITY): Payer: Medicare Other

## 2016-10-18 DIAGNOSIS — R05 Cough: Secondary | ICD-10-CM | POA: Diagnosis not present

## 2016-10-18 DIAGNOSIS — Z79899 Other long term (current) drug therapy: Secondary | ICD-10-CM | POA: Insufficient documentation

## 2016-10-18 DIAGNOSIS — J069 Acute upper respiratory infection, unspecified: Secondary | ICD-10-CM | POA: Insufficient documentation

## 2016-10-18 DIAGNOSIS — E039 Hypothyroidism, unspecified: Secondary | ICD-10-CM | POA: Insufficient documentation

## 2016-10-18 DIAGNOSIS — Z96653 Presence of artificial knee joint, bilateral: Secondary | ICD-10-CM | POA: Insufficient documentation

## 2016-10-18 DIAGNOSIS — B9789 Other viral agents as the cause of diseases classified elsewhere: Secondary | ICD-10-CM

## 2016-10-18 DIAGNOSIS — R0602 Shortness of breath: Secondary | ICD-10-CM | POA: Diagnosis not present

## 2016-10-18 DIAGNOSIS — Z87891 Personal history of nicotine dependence: Secondary | ICD-10-CM | POA: Diagnosis not present

## 2016-10-18 LAB — BASIC METABOLIC PANEL
ANION GAP: 8 (ref 5–15)
BUN: 12 mg/dL (ref 6–20)
CALCIUM: 9.3 mg/dL (ref 8.9–10.3)
CHLORIDE: 97 mmol/L — AB (ref 101–111)
CO2: 28 mmol/L (ref 22–32)
Creatinine, Ser: 0.99 mg/dL (ref 0.44–1.00)
GFR, EST NON AFRICAN AMERICAN: 59 mL/min — AB (ref >60)
Glucose, Bld: 112 mg/dL — ABNORMAL HIGH (ref 65–99)
POTASSIUM: 3.8 mmol/L (ref 3.5–5.1)
SODIUM: 133 mmol/L — AB (ref 135–145)

## 2016-10-18 LAB — I-STAT TROPONIN, ED: TROPONIN I, POC: 0 ng/mL (ref 0.00–0.08)

## 2016-10-18 LAB — CBC
HCT: 33.8 % — ABNORMAL LOW (ref 36.0–46.0)
HEMOGLOBIN: 10.8 g/dL — AB (ref 12.0–15.0)
MCH: 26.2 pg (ref 26.0–34.0)
MCHC: 32 g/dL (ref 30.0–36.0)
MCV: 82 fL (ref 78.0–100.0)
PLATELETS: 231 10*3/uL (ref 150–400)
RBC: 4.12 MIL/uL (ref 3.87–5.11)
RDW: 14.9 % (ref 11.5–15.5)
WBC: 5.1 10*3/uL (ref 4.0–10.5)

## 2016-10-18 NOTE — ED Notes (Signed)
Patient sitting on the side of stretcher watching television; no needs at this time

## 2016-10-18 NOTE — ED Triage Notes (Addendum)
Pt states was seen by pcp for cough and given cough meds with codeine but the cough is worse. States throat is tight after coughing.  Airway patent.  Does not appears in distress.  Able to speak in full sentences.  BIl LE edema which is new.  New weight gain. L arm aches from shoulder to wrist, but she feels it's r/t recent flu shot.

## 2016-10-18 NOTE — ED Notes (Signed)
Ambulated patient with pulse OX.  O2 sats stayed between 98-100 with a pulse of 98-108  Pt. sts she feels great and thinks she is getting better

## 2016-10-18 NOTE — ED Provider Notes (Signed)
[facility]-EMERGENCY DEPT Provider Note   CSN: [id number] Arrival date & time: 10/18/16  9562     History   Chief Complaint Chief Complaint  Patient presents with  . Cough    HPI Brenda Murray is a 65 y.o. female past medical history of hypothyroidism, bipolar disorder, anxiety, who presents to the emergency department with 5 days of persistent productive cough and rhinorrhea. This began with a sore throat last Wednesday and she has been seen by her PCP who prescribed a cough syrup with codeine. She reports not being able to take it as it made her vomit. She reports chest tightness and difficulty swallowing and a yellow phlegm productive cough. She noticed that she has been more short of breath when she exerts herself. She has been keeping hydrated. She states that her left arm is sore from a recent flu shot 2 weeks ago which was prior to this chest congestion. She also endorses some lower extremity edema which she reports is normal for her since she has had bilateral knee surgery. She denies prior PE/DVT, prolonged immobilization, recent surgery, exogenous estrogen use,  ipsilateral leg swelling or tenderness.  She denies fever, chills, nausea/vomiting, current chest pain or shortness of breath, abdominal pain, congestion, diarrhea.  HPI  Past Medical History:  Diagnosis Date  . Anxiety   . Bipolar disorder (HCC)   . Chronic back pain   . History of indigestion   . Hypothyroidism     Patient Active Problem List   Diagnosis Date Noted  . Nausea and vomiting 11/04/2014  . Hypothyroid 11/04/2014  . Metabolic alkalosis 11/04/2014  . Hypokalemia 11/04/2014  . Confusion 07/11/2013  . Depressive disorder, not elsewhere classified 07/11/2013  . Anxiety 07/11/2013    Past Surgical History:  Procedure Laterality Date  . carpel tunnel    . ESOPHAGOGASTRODUODENOSCOPY N/A 11/06/2014   Procedure: ESOPHAGOGASTRODUODENOSCOPY (EGD);  Surgeon: Charna Elizabeth, MD;  Location: [facility] ENDOSCOPY;   Service: Endoscopy;  Laterality: N/A;  . JOINT REPLACEMENT     bilat knee  . TONSILLECTOMY    . TOTAL KNEE REVISION  05/01/2012   Procedure: TOTAL KNEE REVISION;  Surgeon: Raymon Mutton, MD;  Location: [facility] OR;  Service: Orthopedics;  Laterality: Right;    OB History    No data available       Home Medications    Prior to Admission medications   Medication Sig Start Date End Date Taking? Authorizing Provider  bumetanide (BUMEX) 1 MG tablet Take 1 tablet (1 mg total) by mouth daily. 09/16/15   Charm Rings, MD  calcium citrate (CALCITRATE - DOSED IN MG ELEMENTAL CALCIUM) 950 MG tablet Take 200 mg of elemental calcium by mouth daily.    Historical Provider, MD  Cholecalciferol (VITAMIN D) 2000 UNITS CAPS Take 1 capsule by mouth daily.    Historical Provider, MD  divalproex (DEPAKOTE ER) 250 MG 24 hr tablet Take 750 mg by mouth at bedtime.     Historical Provider, MD  HYDROcodone-acetaminophen (NORCO) 5-325 MG tablet Take 1 tablet by mouth every 6 (six) hours as needed for moderate pain. 06/14/16   Charm Rings, MD  ibuprofen (ADVIL,MOTRIN) 200 MG tablet Take 400 mg by mouth every 6 (six) hours as needed for moderate pain.    Historical Provider, MD  ketotifen (ZADITOR) 0.025 % ophthalmic solution Place 1 drop into both eyes 2 (two) times daily.    Historical Provider, MD  levothyroxine (SYNTHROID, LEVOTHROID) 112 MCG tablet Take 112 mcg by mouth daily  before breakfast.    Historical Provider, MD  LIDOCAINE HCL, PF, IJ Inject as directed. Gets at each PT appointment    Historical Provider, MD  lithium carbonate 300 MG capsule Take 300 mg by mouth at bedtime.    Historical Provider, MD  LORazepam (ATIVAN) 0.5 MG tablet Take 1 tablet (0.5 mg total) by mouth every 6 (six) hours as needed for anxiety (for acute aggitation and to help sleep). 06/04/15   Earney NavyJosephine C Onuoha, NP  meloxicam (MOBIC) 7.5 MG tablet Take 1 tablet (7.5 mg total) by mouth daily. 06/14/16   Charm RingsErin J Honig, MD  omeprazole  (PRILOSEC) 20 MG capsule Take 1 capsule (20 mg total) by mouth daily. 09/16/15   Charm RingsErin J Honig, MD  oxymetazoline (AFRIN NASAL SPRAY) 0.05 % nasal spray Place 1 spray into both nostrils 2 (two) times daily. 03/26/16   Barrett HenleNicole Elizabeth Nadeau, PA-C  predniSONE (DELTASONE) 50 MG tablet Take 1 pill daily for 5 days. 06/14/16   Charm RingsErin J Honig, MD  sertraline (ZOLOFT) 50 MG tablet Take 50 mg by mouth daily as needed (for depression and anxiety).    Historical Provider, MD  traMADol (ULTRAM) 50 MG tablet Take 1 tablet (50 mg total) by mouth every 6 (six) hours as needed. 03/28/16   Marlon Peliffany Greene, PA-C    Family History No family history on file.  Social History Social History  Substance Use Topics  . Smoking status: Former Games developermoker  . Smokeless tobacco: Never Used  . Alcohol use No     Allergies   Patient has no known allergies.   Review of Systems Review of Systems  Constitutional: Negative for appetite change, chills, diaphoresis, fatigue and fever.  HENT: Positive for rhinorrhea, sore throat and trouble swallowing. Negative for congestion, ear discharge, ear pain, facial swelling, nosebleeds, postnasal drip, sinus pain and sinus pressure.   Eyes: Negative for pain, discharge, redness, itching and visual disturbance.  Respiratory: Positive for cough. Negative for apnea, choking, chest tightness, shortness of breath, wheezing and stridor.   Cardiovascular: Positive for chest pain. Negative for palpitations.       Patient reports chest tightness with productive coughing and chest congestion  Gastrointestinal: Negative for abdominal distention, abdominal pain, anal bleeding, blood in stool, nausea and vomiting.  Genitourinary: Negative for difficulty urinating, dysuria, flank pain, frequency, hematuria, pelvic pain and urgency.  Musculoskeletal: Negative for back pain, gait problem, neck pain and neck stiffness.  Skin: Negative for color change, pallor, rash and wound.  Neurological: Negative for  dizziness, syncope, speech difficulty, weakness, light-headedness, numbness and headaches.     Physical Exam Updated Vital Signs BP 158/89 (BP Location: Left Arm)   Pulse 100   Temp 98.7 F (37.1 C) (Oral)   Resp 22   Ht 5' (1.524 m)   Wt 100.7 kg   SpO2 95%   BMI 43.36 kg/m   Physical Exam  Constitutional: She is oriented to person, place, and time. She appears well-developed and well-nourished. No distress.  Nontoxic appearing in no acute distress sitting comfortably in bed  HENT:  Head: Atraumatic.  Right Ear: External ear normal.  Left Ear: External ear normal.  Nose: Nose normal.  Mouth/Throat: Oropharynx is clear and moist. No oropharyngeal exudate.  Oropharynx is mildly erythematous.  Eyes: Conjunctivae and EOM are normal. Pupils are equal, round, and reactive to light. Right eye exhibits no discharge. Left eye exhibits no discharge. No scleral icterus.  Neck: Normal range of motion. Neck supple. No JVD present. No tracheal  deviation present.  Cardiovascular: Normal rate, regular rhythm, normal heart sounds and intact distal pulses.  Exam reveals no gallop.   No murmur heard. Pulmonary/Chest: Effort normal and breath sounds normal. No stridor. No respiratory distress. She has no rales. She exhibits no tenderness.  Mild expiratory wheezing appreciated in the right upper lobe  Abdominal: Soft. Bowel sounds are normal. She exhibits no distension and no mass. There is no tenderness. There is no rebound and no guarding.  Musculoskeletal: Normal range of motion.  Lymphadenopathy:    She has no cervical adenopathy.  Neurological: She is alert and oriented to person, place, and time. No sensory deficit. Coordination normal.  Skin: Skin is warm and dry. Capillary refill takes less than 2 seconds. No rash noted. She is not diaphoretic. No erythema. No pallor.  Psychiatric: She has a normal mood and affect. Her behavior is normal.  Nursing note and vitals reviewed.    ED  Treatments / Results  Labs (all labs ordered are listed, but only abnormal results are displayed) Labs Reviewed  BASIC METABOLIC PANEL - Abnormal; Notable for the following:       Result Value   Sodium 133 (*)    Chloride 97 (*)    Glucose, Bld 112 (*)    GFR calc non Af Amer 59 (*)    All other components within normal limits  CBC - Abnormal; Notable for the following:    Hemoglobin 10.8 (*)    HCT 33.8 (*)    All other components within normal limits  I-STAT TROPOININ, ED   Hemoglobin  Date Value Ref Range Status  10/18/2016 10.8 (L) 12.0 - 15.0 g/dL Final  16/08/9603 54.0 (L) 12.0 - 15.0 g/dL Final  98/09/9146 82.9 (L) 12.0 - 15.0 g/dL Final  56/21/3086 57.8 (L) 12.0 - 15.0 g/dL Final   EKG  EKG Interpretation  Date/Time:  Monday October 18 2016 08:04:31 EST Ventricular Rate:  111 PR Interval:  168 QRS Duration: 64 QT Interval:  336 QTC Calculation: 456 R Axis:   62 Text Interpretation:  Sinus tachycardia Nonspecific T wave abnormality Abnormal ECG No acute changes Confirmed by Rhunette Croft, MD, Janey Genta [phone]) on 10/18/2016 10:19:08 AM       Radiology Dg Chest 2 View  Result Date: 10/18/2016 CLINICAL DATA:  Shortness of Breath EXAM: CHEST  2 VIEW COMPARISON:  September 16, 2015 FINDINGS: There is scarring in the right upper lobe, not appreciably changed. There is no edema or consolidation. Heart size pulmonary vascularity are normal. No adenopathy. There is atherosclerotic calcification in the aorta. No bone lesions. IMPRESSION: Right upper lobe scarring. No edema or consolidation. Stable cardiac silhouette. There is aortic atherosclerosis. Electronically Signed   By: Bretta Bang III M.D.   On: 10/18/2016 08:29    Procedures Procedures (including critical care time)  Medications Ordered in ED Medications - No data to display   Initial Impression / Assessment and Plan / ED Course  I have reviewed the triage vital signs and the nursing notes.  Pertinent labs &  imaging results that were available during my care of the patient were reviewed by me and considered in my medical decision making (see chart for details).  Clinical Course as of Oct 18 1021  Mon Oct 18, 2016  9528 Hemoglobin: (!) 10.8 [JM]  4132 ED EKG within 10 minutes [JM]  1012 DG Chest 2 View [JM]    Clinical Course User Index [JM] Georgiana Shore, PA-C   The patient appears reasonably stabilized  for discharge and I doubt [facility] further screening and/or treatment in the ED at this time prior to discharge. Patient reported feeling better already. She is scheduled to see her PCP this week.  EKG: No St-elevation. First Troponin-I negative. No history of ACS. Patient describes her chest discomfort as tightness from congestion coming down from her throat which feels tight. She denies shortness of breath at this time. Unlikely to be ACS based on history, exam, labs and imaging obtained today.  Chest x-ray is negative with the exception of right upper lobe scarring which is unchanged since October 2017. Lungs are otherwise clear to auscultation and patient is afebrile, and nontoxic-appearing lowering suspicion for pneumonia.  She denies prior PE/DVT, prolonged immobilization, recent surgery, exogenous estrogen use, or ipsilateral leg swelling. Patient is not tachycardic, hypoxic, or exhibits calf tenderness/swelling, or hemoptysis which significantly lower's my suspicion of a PE. Ambulatory O2 saturation remained above 98 % and patient reported feeling great.  Patient is to be discharged with recommendation to follow up with PCP in regards to today's hospital visit. Chest pain is less likely to be of cardiac etiology,  no tracheal deviation, no JVD or new murmur, RRR, breath sounds equal bilaterally, EKG without acute abnormalities, negative troponin, and negative CXR.  Pt has been advised to return to the ED if CP becomes exertional, associated with diaphoresis or nausea,  radiates to left jaw/arm, worsens or becomes concerning in any way. Pt appears reliable for follow up and is agreeable to discharge.  Strict return precautions were discussed. Patient understood and was advised to  return to the emergency department if experiencing any worsening of symptoms.  Case has been discussed with and seen by Dr. Rhunette CroftNanavati who agrees with the above plan to discharge.    Final Clinical Impressions(s) / ED Diagnoses   Final diagnoses:  None    New Prescriptions New Prescriptions   No medications on file     Georgiana ShoreJessica B Luella Gardenhire, PA-C 10/18/16 1208    Georgiana ShoreJessica B Frank Novelo, PA-C 10/20/16 2141    Derwood KaplanAnkit Nanavati, MD 10/21/16 [phone]

## 2016-10-18 NOTE — ED Notes (Signed)
Papers reviewed with patient and she sts that she is feeling better. Leaving ambulatory with her son today

## 2016-10-18 NOTE — ED Notes (Signed)
Patient up ambulatory to the bathroom at this time; Kirt BoysMolly, RN present in room

## 2016-10-20 DIAGNOSIS — J069 Acute upper respiratory infection, unspecified: Secondary | ICD-10-CM | POA: Diagnosis not present

## 2016-10-20 DIAGNOSIS — R609 Edema, unspecified: Secondary | ICD-10-CM | POA: Diagnosis not present

## 2016-10-20 DIAGNOSIS — I10 Essential (primary) hypertension: Secondary | ICD-10-CM | POA: Diagnosis not present

## 2016-11-09 DIAGNOSIS — R490 Dysphonia: Secondary | ICD-10-CM | POA: Diagnosis not present

## 2016-11-09 DIAGNOSIS — Z87891 Personal history of nicotine dependence: Secondary | ICD-10-CM | POA: Diagnosis not present

## 2016-11-09 DIAGNOSIS — K1329 Other disturbances of oral epithelium, including tongue: Secondary | ICD-10-CM | POA: Diagnosis not present

## 2016-11-09 DIAGNOSIS — K219 Gastro-esophageal reflux disease without esophagitis: Secondary | ICD-10-CM | POA: Diagnosis not present

## 2016-12-21 ENCOUNTER — Other Ambulatory Visit: Payer: Self-pay | Admitting: Internal Medicine

## 2016-12-21 DIAGNOSIS — Z1231 Encounter for screening mammogram for malignant neoplasm of breast: Secondary | ICD-10-CM

## 2016-12-31 ENCOUNTER — Emergency Department (HOSPITAL_COMMUNITY): Payer: Medicare Other

## 2016-12-31 ENCOUNTER — Encounter (HOSPITAL_COMMUNITY): Payer: Self-pay | Admitting: Emergency Medicine

## 2016-12-31 ENCOUNTER — Emergency Department (HOSPITAL_COMMUNITY)
Admission: EM | Admit: 2016-12-31 | Discharge: 2016-12-31 | Disposition: A | Discharge status: Home / Self Care | Payer: Medicare Other | Attending: Emergency Medicine | Admitting: Emergency Medicine

## 2016-12-31 DIAGNOSIS — Z87891 Personal history of nicotine dependence: Secondary | ICD-10-CM | POA: Insufficient documentation

## 2016-12-31 DIAGNOSIS — Z79899 Other long term (current) drug therapy: Secondary | ICD-10-CM | POA: Diagnosis not present

## 2016-12-31 DIAGNOSIS — R1011 Right upper quadrant pain: Secondary | ICD-10-CM | POA: Diagnosis not present

## 2016-12-31 DIAGNOSIS — E039 Hypothyroidism, unspecified: Secondary | ICD-10-CM | POA: Diagnosis not present

## 2016-12-31 DIAGNOSIS — K760 Fatty (change of) liver, not elsewhere classified: Secondary | ICD-10-CM | POA: Diagnosis not present

## 2016-12-31 DIAGNOSIS — R112 Nausea with vomiting, unspecified: Secondary | ICD-10-CM | POA: Diagnosis not present

## 2016-12-31 DIAGNOSIS — R197 Diarrhea, unspecified: Secondary | ICD-10-CM | POA: Insufficient documentation

## 2016-12-31 DIAGNOSIS — K2970 Gastritis, unspecified, without bleeding: Secondary | ICD-10-CM | POA: Diagnosis not present

## 2016-12-31 DIAGNOSIS — Z96653 Presence of artificial knee joint, bilateral: Secondary | ICD-10-CM | POA: Diagnosis not present

## 2016-12-31 DIAGNOSIS — R1084 Generalized abdominal pain: Secondary | ICD-10-CM | POA: Diagnosis present

## 2016-12-31 LAB — COMPREHENSIVE METABOLIC PANEL
ALBUMIN: 3.7 g/dL (ref 3.5–5.0)
ALT: 16 U/L (ref 14–54)
AST: 23 U/L (ref 15–41)
Alkaline Phosphatase: 47 U/L (ref 38–126)
Anion gap: 10 (ref 5–15)
BUN: 10 mg/dL (ref 6–20)
CO2: 30 mmol/L (ref 22–32)
CREATININE: 0.88 mg/dL (ref 0.44–1.00)
Calcium: 9.8 mg/dL (ref 8.9–10.3)
Chloride: 98 mmol/L — ABNORMAL LOW (ref 101–111)
GFR calc Af Amer: >60 mL/min (ref >60)
GLUCOSE: 113 mg/dL — AB (ref 65–99)
POTASSIUM: 3.9 mmol/L (ref 3.5–5.1)
Sodium: 138 mmol/L (ref 135–145)
Total Bilirubin: 0.3 mg/dL (ref 0.3–1.2)
Total Protein: 7.8 g/dL (ref 6.5–8.1)

## 2016-12-31 LAB — CBC WITH DIFFERENTIAL/PLATELET
Basophils Absolute: 0 10*3/uL (ref 0.0–0.1)
Basophils Relative: 0 %
EOS ABS: 0.1 10*3/uL (ref 0.0–0.7)
EOS PCT: 2 %
HCT: 38.7 % (ref 36.0–46.0)
Hemoglobin: 12.2 g/dL (ref 12.0–15.0)
LYMPHS PCT: 31 %
Lymphs Abs: 1.5 10*3/uL (ref 0.7–4.0)
MCH: 26 pg (ref 26.0–34.0)
MCHC: 31.5 g/dL (ref 30.0–36.0)
MCV: 82.5 fL (ref 78.0–100.0)
MONO ABS: 0.5 10*3/uL (ref 0.1–1.0)
MONOS PCT: 10 %
Neutro Abs: 2.8 10*3/uL (ref 1.7–7.7)
Neutrophils Relative %: 57 %
Platelets: 186 10*3/uL (ref 150–400)
RBC: 4.69 MIL/uL (ref 3.87–5.11)
RDW: 14.6 % (ref 11.5–15.5)
WBC: 4.9 10*3/uL (ref 4.0–10.5)

## 2016-12-31 LAB — URINALYSIS, ROUTINE W REFLEX MICROSCOPIC
BILIRUBIN URINE: NEGATIVE
GLUCOSE, UA: NEGATIVE mg/dL
Hgb urine dipstick: NEGATIVE
Ketones, ur: NEGATIVE mg/dL
NITRITE: NEGATIVE
PH: 6.5 (ref 5.0–8.0)
Protein, ur: NEGATIVE mg/dL
SPECIFIC GRAVITY, URINE: 1.015 (ref 1.005–1.030)

## 2016-12-31 LAB — URINALYSIS, MICROSCOPIC (REFLEX)

## 2016-12-31 LAB — LIPASE, BLOOD: Lipase: 23 U/L (ref 11–51)

## 2016-12-31 LAB — LITHIUM LEVEL: Lithium Lvl: 0.42 mmol/L — ABNORMAL LOW (ref 0.60–1.20)

## 2016-12-31 LAB — TROPONIN I

## 2016-12-31 MED ORDER — ONDANSETRON HCL 4 MG/2ML IJ SOLN
4.0000 mg | Freq: Once | INTRAMUSCULAR | Status: AC
  Administered 2016-12-31: 4 mg via INTRAVENOUS
  Filled 2016-12-31: qty 2

## 2016-12-31 MED ORDER — SODIUM CHLORIDE 0.9 % IV BOLUS (SEPSIS)
1000.0000 mL | Freq: Once | INTRAVENOUS | Status: AC
  Administered 2016-12-31: 1000 mL via INTRAVENOUS

## 2016-12-31 MED ORDER — FENTANYL CITRATE (PF) 100 MCG/2ML IJ SOLN
50.0000 ug | Freq: Once | INTRAMUSCULAR | Status: AC
  Administered 2016-12-31: 50 ug via INTRAVENOUS
  Filled 2016-12-31: qty 2

## 2016-12-31 MED ORDER — ONDANSETRON 4 MG PO TBDP: 4.0000 mg | ORAL_TABLET | Freq: Three times a day (TID) | ORAL | 0 refills | Status: DC | PRN

## 2016-12-31 NOTE — ED Provider Notes (Signed)
[facility]-EMERGENCY DEPT Provider Note   CSN: [id number] Arrival date & time: 12/31/16  0149 By signing my name below, I, Levon Hedger, attest that this documentation has been prepared under the direction and in the presence of [name], MD . Electronically Signed: Levon Hedger, Scribe. 12/31/2016. 3:31 AM.   History   Chief Complaint Chief Complaint  Patient presents with  . Abdominal Pain   HPI Brenda Murray is a 66 y.o. female who presents to the Emergency Department complaining of constant, moderate, diffuse abdominal pain onset one week ago. She states she has taken milk of magnesia with no relief of pain. She notes associated nausea, vomiting, dizziness, headache, and diarrhea which began after taking the milk of magnesia. No alleviating or modifying factors noted.  No abdominal SHx. NKDA. She denies any fever, hematochezia, CP, SOB, or any other associated symptoms.   The history is provided by the patient. No language interpreter was used.  Abdominal Pain   This is a new problem. The current episode started more than 1 week ago. The problem occurs constantly. The pain is associated with an unknown factor. The pain is located in the generalized abdominal region. Nothing relieves the symptoms.   Past Medical History:  Diagnosis Date  . Anxiety   . Bipolar disorder (HCC)   . Chronic back pain   . History of indigestion   . Hypothyroidism     Patient Active Problem List   Diagnosis Date Noted  . Nausea and vomiting 11/04/2014  . Hypothyroid 11/04/2014  . Metabolic alkalosis 11/04/2014  . Hypokalemia 11/04/2014  . Confusion 07/11/2013  . Depressive disorder, not elsewhere classified 07/11/2013  . Anxiety 07/11/2013    Past Surgical History:  Procedure Laterality Date  . carpel tunnel    . ESOPHAGOGASTRODUODENOSCOPY N/A 11/06/2014   Procedure: ESOPHAGOGASTRODUODENOSCOPY (EGD);  Surgeon: Charna Elizabeth, MD;  Location[facility] ENDOSCOPY;  Service: Endoscopy;  Laterality: N/A;    . JOINT REPLACEMENT     bilat knee  . TONSILLECTOMY    . TOTAL KNEE REVISION  05/01/2012   Procedure: TOTAL KNEE REVISION;  Surgeon: Raymon Mutton, MD;  Location: [facility] OR;  Service: Orthopedics;  Laterality: Right;    OB History    No data available       Home Medications    Prior to Admission medications   Medication Sig Start Date End Date Taking? Authorizing Provider  divalproex (DEPAKOTE ER) 250 MG 24 hr tablet Take 750 mg by mouth at bedtime. 12/10/16  Yes Historical Provider, MD  DULoxetine (CYMBALTA) 60 MG capsule Take 60 mg by mouth daily. 12/10/16  Yes Historical Provider, MD  levothyroxine (SYNTHROID, LEVOTHROID) 112 MCG tablet Take 112 mcg by mouth daily before breakfast.   Yes Historical Provider, MD  lithium carbonate 300 MG capsule Take 300 mg by mouth at bedtime. 12/10/16  Yes Historical Provider, MD  OLANZapine (ZYPREXA) 10 MG tablet Take 10 mg by mouth daily. 12/10/16  Yes Historical Provider, MD  omeprazole (PRILOSEC) 40 MG capsule Take 40 mg by mouth daily. 12/10/16  Yes Historical Provider, MD  bumetanide (BUMEX) 1 MG tablet Take 1 tablet (1 mg total) by mouth daily. Patient not taking: Reported on 10/18/2016 09/16/15   Charm Rings, MD  HYDROcodone-acetaminophen (NORCO) 5-325 MG tablet Take 1 tablet by mouth every 6 (six) hours as needed for moderate pain. Patient not taking: Reported on 10/18/2016 06/14/16   Charm Rings, MD  LORazepam (ATIVAN) 0.5 MG tablet Take 1 tablet (0.5 mg total)  by mouth every 6 (six) hours as needed for anxiety (for acute aggitation and to help sleep). Patient not taking: Reported on 12/31/2016 06/04/15   Earney Navy, NP  meloxicam (MOBIC) 7.5 MG tablet Take 1 tablet (7.5 mg total) by mouth daily. Patient not taking: Reported on 12/31/2016 06/14/16   Charm Rings, MD  omeprazole (PRILOSEC) 20 MG capsule Take 1 capsule (20 mg total) by mouth daily. Patient not taking: Reported on 12/31/2016 09/16/15   Charm Rings, MD  oxymetazoline [facility]  NASAL SPRAY) 0.05 % nasal spray Place 1 spray into both nostrils 2 (two) times daily. Patient not taking: Reported on 12/31/2016 03/26/16   Barrett Henle, PA-C  predniSONE (DELTASONE) 50 MG tablet Take 1 pill daily for 5 days. Patient not taking: Reported on 10/18/2016 06/14/16   Charm Rings, MD  traMADol (ULTRAM) 50 MG tablet Take 1 tablet (50 mg total) by mouth every 6 (six) hours as needed. Patient not taking: Reported on 10/18/2016 03/28/16   Marlon Pel, PA-C    Family History No family history on file.  Social History Social History  Substance Use Topics  . Smoking status: Former Games developer  . Smokeless tobacco: Never Used  . Alcohol use No     Allergies   Patient has no known allergies.   Review of Systems Review of Systems  Gastrointestinal: Positive for abdominal pain.   10 systems reviewed and all are negative for acute change except as noted in the HPI.   Physical Exam Updated Vital Signs BP 115/72   Pulse 63   Temp 98.1 F (36.7 C) (Oral)   Resp 16   Ht 5\' 1"  (1.549 m)   Wt 100.7 kg   SpO2 94%   BMI 41.95 kg/m   Physical Exam CONSTITUTIONAL: Well developed/well nourished HEAD: Normocephalic/atraumatic EYES: EOMI/PERRL. No scleral icterus ENMT: Mucous membranes dry NECK: supple no meningeal signs SPINE/BACK:entire spine nontender CV: S1/S2 noted, no murmurs/rubs/gallops noted LUNGS: Lungs are clear to auscultation bilaterally, no apparent distress ABDOMEN: soft, moderate RUQ and epigastric tenderness, no rebound or guarding, bowel sounds noted throughout abdomen GU:no cva tenderness NEURO: Pt is awake/alert/appropriate, moves all extremitiesx4.  No facial droop.   EXTREMITIES: pulses normal/equal, full ROM SKIN: warm, color normal PSYCH: no abnormalities of mood noted, alert and oriented to situation   ED Treatments / Results  DIAGNOSTIC STUDIES:  Oxygen Saturation is 96%% on Ra, normal by my interpretation.    COORDINATION OF  CARE:  3:32 AM Discussed treatment plan with pt at bedside and pt agreed to plan.   Labs (all labs ordered are listed, but only abnormal results are displayed) Labs Reviewed  COMPREHENSIVE METABOLIC PANEL - Abnormal; Notable for the following:       Result Value   Chloride 98 (*)    Glucose, Bld 113 (*)    All other components within normal limits  URINALYSIS, ROUTINE W REFLEX MICROSCOPIC - Abnormal; Notable for the following:    Leukocytes, UA TRACE (*)    All other components within normal limits  LITHIUM LEVEL - Abnormal; Notable for the following:    Lithium Lvl 0.42 (*)    All other components within normal limits  URINALYSIS, MICROSCOPIC (REFLEX) - Abnormal; Notable for the following:    Bacteria, UA RARE (*)    Squamous Epithelial / LPF 0-5 (*)    All other components within normal limits  CBC WITH DIFFERENTIAL/PLATELET  TROPONIN I  LIPASE, BLOOD    EKG ED ECG REPORT  Date: 12/31/2016 0336am  Rate: 71  Rhythm: normal sinus rhythm  QRS Axis: normal  Intervals: normal  ST/T Wave abnormalities: nonspecific ST changes  Conduction Disutrbances:none  Narrative Interpretation:    I have personally reviewed the EKG tracing and agree with the computerized printout as noted.  Radiology [place] Abdomen Limited Ruq  Result Date: 12/31/2016 CLINICAL DATA:  Right upper quadrant pain for 1 week. Nausea and vomiting. EXAM: US ABDOMEN LIMITED - RIGHT UPPER QUADRANT COMPARISON:  Ultrasound 11/05/2014 FINDINGS: Gallbladder: Gallbladder is somewhat contracted, possibly physiologic although the patient states she is 18 since last night. No gallbladder wall thickening or edema. No stones or sludge. Murphy's sign is negative. Common bile duct: Diameter: 3.9 mm, normal Liver: Diffusely increased hepatic parenchymal echotexture suggesting mild fatty infiltration. No focal lesions identified. IMPRESSION: Nonspecific gallbladder contraction. No evidence of coli lithiasis or cholecystitis. Diffuse  fatty infiltration of the liver. Electronically Signed   By: Burman NievesWilliam  Stevens M.D.   On: 12/31/2016 06:06    Procedures Procedures Medications Ordered in ED Medications  ondansetron (ZOFRAN) injection 4 mg (4 mg Intravenous Given 12/31/16 0431)  fentaNYL (SUBLIMAZE) injection 50 mcg (50 mcg Intravenous Given 12/31/16 0433)  sodium chloride 0.9 % bolus 1,000 mL (0 mLs Intravenous Stopped 12/31/16 0621)  ondansetron (ZOFRAN) injection 4 mg (4 mg Intravenous Given 12/31/16 [id number])     Initial Impression / Assessment and Plan / ED Course  I have reviewed the triage vital signs and the nursing notes.  Pertinent labs & imaging results that were available during my care of the patient were reviewed by me and considered in my medical decision making (see chart for details).     5:14 AM Pt stable She is now improved Labs reassuring However she still has RUQ tenderness No other focal tenderness Will obtain US imaging of abdomen 7:31 AM Pain improved Pt ambulatory She is taking PO No vomiting She has no focal tenderness She has had some diarrhea while in the ED She reports "not feeling well" but no more pain or vomiting and she wants to go home  All labs reassuring, no signs of cholecystitis by ultrasound and no ABD tenderness I feel she can go home We discussed strict ER return precautions BP 132/91   Pulse 76   Temp 98.1 F (36.7 C) (Oral)   Resp 11   Ht 5\' 1"  (1.549 m)   Wt 100.7 kg   SpO2 97%   BMI 41.95 kg/m    Final Clinical Impressions(s) / ED Diagnoses   Final diagnoses:  RUQ pain  Nausea vomiting and diarrhea    New Prescriptions New Prescriptions   ONDANSETRON (ZOFRAN-ODT) 4 MG DISINTEGRATING TABLET    Take 1 tablet (4 mg total) by mouth every 8 (eight) hours as needed.  I personally performed the services described in this documentation, which was scribed in my presence. The recorded information has been reviewed and is accurate.        Zadie Rhineonald Daniyla Pfahler,  MD 12/31/16 [phone]

## 2016-12-31 NOTE — ED Notes (Signed)
ED Provider at bedside. 

## 2016-12-31 NOTE — ED Notes (Signed)
Patient transported to Ultrasound 

## 2016-12-31 NOTE — Discharge Instructions (Signed)

## 2016-12-31 NOTE — ED Notes (Signed)
Pt comfortable with discharge and follow up instructions. Pt declines wheelchair, escorted to waiting area by this RN. Rx x1 

## 2016-12-31 NOTE — ED Triage Notes (Signed)
Per EMS: pt has had abdominal pain x 1 week.  Pt c/o of N/V.   Pt was given 4 of zofran 128/88 NSR 98& RA

## 2017-01-28 ENCOUNTER — Ambulatory Visit
Admission: RE | Admit: 2017-01-28 | Discharge: 2017-01-28 | Disposition: A | Discharge status: Home / Self Care | Payer: Medicare Other | Source: Ambulatory Visit | Attending: Internal Medicine | Admitting: Internal Medicine

## 2017-01-28 DIAGNOSIS — Z1231 Encounter for screening mammogram for malignant neoplasm of breast: Secondary | ICD-10-CM

## 2017-02-14 ENCOUNTER — Encounter (HOSPITAL_COMMUNITY): Payer: Self-pay | Admitting: *Deleted

## 2017-02-14 ENCOUNTER — Emergency Department (HOSPITAL_COMMUNITY): Payer: Medicare Other

## 2017-02-14 ENCOUNTER — Emergency Department (HOSPITAL_COMMUNITY)
Admission: EM | Admit: 2017-02-14 | Discharge: 2017-02-14 | Disposition: A | Discharge status: Home / Self Care | Payer: Medicare Other | Attending: Emergency Medicine | Admitting: Emergency Medicine

## 2017-02-14 DIAGNOSIS — J40 Bronchitis, not specified as acute or chronic: Secondary | ICD-10-CM | POA: Diagnosis not present

## 2017-02-14 DIAGNOSIS — J101 Influenza due to other identified influenza virus with other respiratory manifestations: Secondary | ICD-10-CM

## 2017-02-14 DIAGNOSIS — E039 Hypothyroidism, unspecified: Secondary | ICD-10-CM | POA: Insufficient documentation

## 2017-02-14 DIAGNOSIS — Z96653 Presence of artificial knee joint, bilateral: Secondary | ICD-10-CM | POA: Diagnosis not present

## 2017-02-14 DIAGNOSIS — R05 Cough: Secondary | ICD-10-CM | POA: Diagnosis not present

## 2017-02-14 DIAGNOSIS — Z87891 Personal history of nicotine dependence: Secondary | ICD-10-CM | POA: Insufficient documentation

## 2017-02-14 DIAGNOSIS — R0981 Nasal congestion: Secondary | ICD-10-CM | POA: Diagnosis present

## 2017-02-14 LAB — INFLUENZA PANEL BY PCR (TYPE A & B)
Influenza A By PCR: NEGATIVE
Influenza B By PCR: POSITIVE — AB

## 2017-02-14 MED ORDER — PREDNISONE 20 MG PO TABS
60.0000 mg | ORAL_TABLET | Freq: Once | ORAL | Status: AC
  Administered 2017-02-14: 60 mg via ORAL
  Filled 2017-02-14: qty 3

## 2017-02-14 MED ORDER — ALBUTEROL SULFATE HFA 108 (90 BASE) MCG/ACT IN AERS
2.0000 | INHALATION_SPRAY | Freq: Once | RESPIRATORY_TRACT | Status: AC
  Administered 2017-02-14: 2 via RESPIRATORY_TRACT
  Filled 2017-02-14: qty 6.7

## 2017-02-14 MED ORDER — IPRATROPIUM-ALBUTEROL 0.5-2.5 (3) MG/3ML IN SOLN
3.0000 mL | Freq: Once | RESPIRATORY_TRACT | Status: AC
  Administered 2017-02-14: 3 mL via RESPIRATORY_TRACT
  Filled 2017-02-14: qty 3

## 2017-02-14 MED ORDER — BENZONATATE 100 MG PO CAPS: 100.0000 mg | ORAL_CAPSULE | Freq: Three times a day (TID) | ORAL | 0 refills | Status: DC

## 2017-02-14 MED ORDER — OSELTAMIVIR PHOSPHATE 75 MG PO CAPS: 75.0000 mg | ORAL_CAPSULE | Freq: Two times a day (BID) | ORAL | 0 refills | Status: DC

## 2017-02-14 MED ORDER — ACETAMINOPHEN 325 MG PO TABS
650.0000 mg | ORAL_TABLET | Freq: Once | ORAL | Status: AC
  Administered 2017-02-14: 650 mg via ORAL
  Filled 2017-02-14: qty 2

## 2017-02-14 MED ORDER — AEROCHAMBER PLUS FLO-VU MEDIUM MISC
1.0000 | Freq: Once | Status: AC
  Administered 2017-02-14: 1

## 2017-02-14 MED ORDER — AZITHROMYCIN 250 MG PO TABS: 250.0000 mg | ORAL_TABLET | Freq: Every day | ORAL | 0 refills | Status: DC

## 2017-02-14 NOTE — ED Provider Notes (Signed)
[facility]-EMERGENCY DEPT Provider Note   CSN: [id number] Arrival date & time: 02/14/17  0909  By signing my name below, I, Marnette Burgess Long, attest that this documentation has been prepared under the direction and in the presence of Yahira Timberman, PA-C. Electronically Signed: Marnette Burgess Long, Scribe. 02/14/2017. 10:39 AM.  History   Chief Complaint Chief Complaint  Patient presents with  . Cough  . Nasal Congestion   The history is provided by the patient and medical records. No language interpreter was used.    HPI Comments:  Brenda Murray is a 66 y.o. female with a PMHx of Anxiety, Hypothyroidism, and Bipolar Disorder, who presents to the Emergency Department complaining of constant nasal congestion onset two days ago. She reports sinus pressure with this nasal congestion beginning two days ago. She states "I just feel sick and very sore all over". No h/o lung problems.  Pt has associated symptoms of sinus pain, productive cough with green phlegm, generalized myalgias, wheezing, and a HA. She tried Delsym at home with minimal relief of her symptoms. Lying supine exacerbates her HA. Pt denies fever, SOB, and any other complaints at this time.   Past Medical History:  Diagnosis Date  . Anxiety   . Bipolar disorder (HCC)   . Chronic back pain   . History of indigestion   . Hypothyroidism     Patient Active Problem List   Diagnosis Date Noted  . Nausea and vomiting 11/04/2014  . Hypothyroid 11/04/2014  . Metabolic alkalosis 11/04/2014  . Hypokalemia 11/04/2014  . Confusion 07/11/2013  . Depressive disorder, not elsewhere classified 07/11/2013  . Anxiety 07/11/2013    Past Surgical History:  Procedure Laterality Date  . carpel tunnel    . ESOPHAGOGASTRODUODENOSCOPY N/A 11/06/2014   Procedure: ESOPHAGOGASTRODUODENOSCOPY (EGD);  Surgeon: Charna Elizabeth, MD;  Location: [facility] END[facility]COPY;  Service: Endoscopy;  Laterality: N/A;  . JOINT REPLACEMENT     bilat knee  . TONSILLECTOMY      . TOTAL KNEE REVISION  05/01/2012   Procedure: TOTAL KNEE REVISION;  Surgeon: Raymon Mutton, MD;  Location: [facility] OR;  Service: Orthopedics;  Laterality: Right;    OB History    No data available       Home Medications    Prior to Admission medications   Medication Sig Start Date End Date Taking? Authorizing Provider  bumetanide (BUMEX) 1 MG tablet Take 1 tablet (1 mg total) by mouth daily. Patient not taking: Reported on 10/18/2016 09/16/15   Charm Rings, MD  divalproex (DEPAKOTE ER) 250 MG 24 hr tablet Take 750 mg by mouth at bedtime. 12/10/16   Historical Provider, MD  DULoxetine (CYMBALTA) 60 MG capsule Take 60 mg by mouth daily. 12/10/16   Historical Provider, MD  levothyroxine (SYNTHROID, LEVOTHROID) 112 MCG tablet Take 112 mcg by mouth daily before breakfast.    Historical Provider, MD  lithium carbonate 300 MG capsule Take 300 mg by mouth at bedtime. 12/10/16   Historical Provider, MD  LORazepam (ATIVAN) 0.5 MG tablet Take 1 tablet (0.5 mg total) by mouth every 6 (six) hours as needed for anxiety (for acute aggitation and to help sleep). Patient not taking: Reported on 12/31/2016 06/04/15   Earney Navy, NP  meloxicam (MOBIC) 7.5 MG tablet Take 1 tablet (7.5 mg total) by mouth daily. Patient not taking: Reported on 12/31/2016 06/14/16   Charm Rings, MD  OLANZapine (ZYPREXA) 10 MG tablet Take 10 mg by mouth daily. 12/10/16   Historical Provider, MD  omeprazole (PRILOSEC) 20 MG capsule Take 1 capsule (20 mg total) by mouth daily. Patient not taking: Reported on 12/31/2016 09/16/15   Charm Rings, MD  omeprazole (PRILOSEC) 40 MG capsule Take 40 mg by mouth daily. 12/10/16   Historical Provider, MD  ondansetron (ZOFRAN-ODT) 4 MG disintegrating tablet Take 1 tablet (4 mg total) by mouth every 8 (eight) hours as needed. 12/31/16   [name], MD  oxymetazoline [facility] NASAL SPRAY) 0.05 % nasal spray Place 1 spray into both nostrils 2 (two) times daily. Patient not taking: Reported on  12/31/2016 03/26/16   Barrett Henle, PA-C    Family History Family History  Problem Relation Age of Onset  . Breast cancer Neg Hx     Social History Social History  Substance Use Topics  . Smoking status: Former Games developer  . Smokeless tobacco: Never Used  . Alcohol use No     Allergies   Patient has no known allergies.   Review of Systems Review of Systems  Constitutional: Negative for fever.  HENT: Positive for congestion, sinus pain and sinus pressure.   Respiratory: Positive for cough and wheezing. Negative for shortness of breath.   Musculoskeletal: Positive for myalgias (generalized).  Neurological: Positive for headaches.     Physical Exam Updated Vital Signs BP 121/73 (BP Location: Left Arm)   Pulse 67   Temp 98.2 F (36.8 C) (Oral)   Resp 16   SpO2 100%   Physical Exam  Constitutional: She is oriented to person, place, and time. She appears well-developed and well-nourished.  HENT:  Head: Normocephalic.  Right Ear: External ear normal.  Left Ear: External ear normal.  Mouth/Throat: Oropharynx is clear and moist.  Nasal congestion present  Eyes: Conjunctivae are normal.  Neck: Normal range of motion. Neck supple.  Cardiovascular: Normal rate, regular rhythm and normal heart sounds.   Pulmonary/Chest: Effort normal. She has wheezes.  Expiratory wheezes bilaterally, no respiratory distress. Speaking full sentences.  Abdominal: She exhibits no distension.  Musculoskeletal: Normal range of motion.  Neurological: She is alert and oriented to person, place, and time.  Skin: Skin is warm and dry.  Psychiatric: She has a normal mood and affect.  Nursing note and vitals reviewed.   ED Treatments / Results  DIAGNOSTIC STUDIES:  Oxygen Saturation is 100% on RA, normal by my interpretation.    COORDINATION OF CARE:  10:38 AM Discussed treatment plan with pt at bedside including CXR, breathing Tx, Tylenol, and flu swab and pt agreed to  plan.  Labs (all labs ordered are listed, but only abnormal results are displayed) Labs Reviewed  INFLUENZA PANEL BY PCR (TYPE A & B) - Abnormal; Notable for the following:       Result Value   Influenza B By PCR POSITIVE (*)    All other components within normal limits    EKG  EKG Interpretation None       Radiology Dg Chest 2 View  Result Date: 02/14/2017 CLINICAL DATA:  Cough and cold symptoms for the past 4 days, former smoker. EXAM: CHEST  2 VIEW COMPARISON:  PA and lateral chest x-ray of November 27th 2017 and September 16, 2015. FINDINGS: The lungs are adequately inflated. The interstitial markings are coarse but stable. There is increased density in the left infrahilar region more conspicuous than in the past. The heart and pulmonary vascularity are normal. The mediastinum is normal in width. There is calcification in the wall of the aortic arch. The trachea is midline. The  bony thorax exhibits no acute abnormality. IMPRESSION: Coarse lung markings in the left infrahilar region suggest atelectasis or possible early infiltrate. Followup PA and lateral chest X-ray is recommended in 3-4 weeks following trial of antibiotic therapy to ensure resolution and exclude underlying malignancy. Thoracic aortic atherosclerosis. Electronically Signed   By: David  SwazilandJordan M.D.   On: 02/14/2017 11:09    Procedures Procedures (including critical care time)  Medications Ordered in ED Medications - No data to display   Initial Impression / Assessment and Plan / ED Course  I have reviewed the triage vital signs and the nursing notes.  Pertinent labs & imaging results that were available during my care of the patient were reviewed by me and considered in my medical decision making (see chart for details).   patient in emergency department with cough, congestion, body aches, headache, chills. Wheezing on exam. Will try breathing treatments, chest x-ray, influenza panel. Patient is in no acute  distress.  12:43 PM Influenza B positive. Pt feels better with breathing treatments. Chest x-ray showing possible pneumonia. Vital signs are normal. Patient is stable for outpatient treatment at this time. She is in no distress. Tolerating by mouth fluids. Will discharge home with Tamiflu, Zithromax, Tessalon for cough, inhaler provided here in emergency department. Return precautions discussed. Patient is comfortable with the plan.  Vitals:   02/14/17 0922  BP: 121/73  Pulse: 67  Resp: 16  Temp: 98.2 F (36.8 C)  TempSrc: Oral  SpO2: 100%       Final Clinical Impressions(s) / ED Diagnoses   Final diagnoses:  Influenza B  Bronchitis    New Prescriptions New Prescriptions   No medications on file   I personally performed the services described in this documentation, which was scribed in my presence. The recorded information has been reviewed and is accurate.     Jaynie Crumbleatyana Cayli Escajeda, PA-C 02/14/17 1246    Derwood KaplanAnkit Nanavati, MD 02/14/17 1711

## 2017-02-14 NOTE — ED Triage Notes (Signed)
To ED for eval of head congestion and phlegm for past 3-5 days. States she has sinus pain and a HA that worsens when she lays down. Denies fevers at home. Taking OTC cough meds. No cp or sob

## 2017-02-14 NOTE — Discharge Instructions (Signed)
Rest, drink plenty of fluids. Ibuprofen and Tylenol for body aches and fever. Take Zithromax for possible pneumonia. Tamiflu for influenza treatment. Take Tessalon for cough. Use inhaler 2 puffs every 4 hours for wheezing and cough. Follow-up with family doctor in 2 days. Return if worsening symptoms.

## 2017-02-16 DIAGNOSIS — H04123 Dry eye syndrome of bilateral lacrimal glands: Secondary | ICD-10-CM | POA: Diagnosis not present

## 2017-02-16 DIAGNOSIS — H35363 Drusen (degenerative) of macula, bilateral: Secondary | ICD-10-CM | POA: Diagnosis not present

## 2017-02-16 DIAGNOSIS — H40013 Open angle with borderline findings, low risk, bilateral: Secondary | ICD-10-CM | POA: Diagnosis not present

## 2017-02-16 DIAGNOSIS — Z961 Presence of intraocular lens: Secondary | ICD-10-CM | POA: Diagnosis not present

## 2017-03-11 DIAGNOSIS — I10 Essential (primary) hypertension: Secondary | ICD-10-CM | POA: Diagnosis not present

## 2017-03-11 DIAGNOSIS — Z1231 Encounter for screening mammogram for malignant neoplasm of breast: Secondary | ICD-10-CM | POA: Diagnosis not present

## 2017-03-11 DIAGNOSIS — Z0000 Encounter for general adult medical examination without abnormal findings: Secondary | ICD-10-CM | POA: Diagnosis not present

## 2017-03-11 DIAGNOSIS — E039 Hypothyroidism, unspecified: Secondary | ICD-10-CM | POA: Diagnosis not present

## 2017-03-11 DIAGNOSIS — E782 Mixed hyperlipidemia: Secondary | ICD-10-CM | POA: Diagnosis not present

## 2017-03-14 DIAGNOSIS — Z0000 Encounter for general adult medical examination without abnormal findings: Secondary | ICD-10-CM | POA: Diagnosis not present

## 2017-03-14 DIAGNOSIS — I10 Essential (primary) hypertension: Secondary | ICD-10-CM | POA: Diagnosis not present

## 2017-04-20 ENCOUNTER — Emergency Department (HOSPITAL_COMMUNITY)
Admission: EM | Admit: 2017-04-20 | Discharge: 2017-04-20 | Disposition: A | Discharge status: Home / Self Care | Payer: Medicare Other | Attending: Emergency Medicine | Admitting: Emergency Medicine

## 2017-04-20 ENCOUNTER — Encounter (HOSPITAL_COMMUNITY): Payer: Self-pay | Admitting: Emergency Medicine

## 2017-04-20 DIAGNOSIS — G8929 Other chronic pain: Secondary | ICD-10-CM | POA: Diagnosis not present

## 2017-04-20 DIAGNOSIS — M25562 Pain in left knee: Secondary | ICD-10-CM | POA: Insufficient documentation

## 2017-04-20 DIAGNOSIS — Z96653 Presence of artificial knee joint, bilateral: Secondary | ICD-10-CM | POA: Diagnosis not present

## 2017-04-20 DIAGNOSIS — M25561 Pain in right knee: Secondary | ICD-10-CM | POA: Diagnosis not present

## 2017-04-20 DIAGNOSIS — Z87891 Personal history of nicotine dependence: Secondary | ICD-10-CM | POA: Insufficient documentation

## 2017-04-20 DIAGNOSIS — E039 Hypothyroidism, unspecified: Secondary | ICD-10-CM | POA: Diagnosis not present

## 2017-04-20 DIAGNOSIS — K0889 Other specified disorders of teeth and supporting structures: Secondary | ICD-10-CM | POA: Diagnosis not present

## 2017-04-20 DIAGNOSIS — Z79899 Other long term (current) drug therapy: Secondary | ICD-10-CM | POA: Diagnosis not present

## 2017-04-20 MED ORDER — PENICILLIN V POTASSIUM 250 MG PO TABS
500.0000 mg | ORAL_TABLET | Freq: Once | ORAL | Status: AC
  Administered 2017-04-20: 500 mg via ORAL
  Filled 2017-04-20: qty 2

## 2017-04-20 MED ORDER — PENICILLIN V POTASSIUM 500 MG PO TABS: 500.0000 mg | ORAL_TABLET | Freq: Three times a day (TID) | ORAL | 0 refills | Status: DC

## 2017-04-20 MED ORDER — TRAMADOL HCL 50 MG PO TABS: 50.0000 mg | ORAL_TABLET | Freq: Four times a day (QID) | ORAL | 0 refills | Status: DC | PRN

## 2017-04-20 MED ORDER — TRAMADOL HCL 50 MG PO TABS
50.0000 mg | ORAL_TABLET | Freq: Once | ORAL | Status: AC
  Administered 2017-04-20: 50 mg via ORAL
  Filled 2017-04-20: qty 1

## 2017-04-20 NOTE — ED Provider Notes (Signed)
[facility]-EMERG[facility]Y DEPT Provider Note   CSN: [id number] Arrival date & time: 04/20/17  0027     History   Chief Complaint Chief Complaint  Patient presents with  . Leg Pain    HPI Brenda Murray is a 66 y.o. female.  Patient presents with complaint of right knee pain and swelling. She has had a total knee replacement remotely. She states she has chronic pain in the knee and has taken Ultram for pain but is out. No new injury. She also reports generalized dental pain in her gums, not isolated to specific tooth/teeth. No fever, facial swelling, no difficulty swallowing. She feels her gums are swollen. No bleeding or drainage.   The history is provided by the patient. No language interpreter was used.    Past Medical History:  Diagnosis Date  . Anxiety   . Bipolar disorder (HCC)   . Chronic back pain   . History of indigestion   . Hypothyroidism     Patient Active Problem List   Diagnosis Date Noted  . Nausea and vomiting 11/04/2014  . Hypothyroid 11/04/2014  . Metabolic alkalosis 11/04/2014  . Hypokalemia 11/04/2014  . Confusion 07/11/2013  . Depressive disorder, not elsewhere classified 07/11/2013  . Anxiety 07/11/2013    Past Surgical History:  Procedure Laterality Date  . carpel tunnel    . ESOPHAGOGASTRODUODENOSCOPY N/A 11/06/2014   Procedure: ESOPHAGOGASTRODUODENOSCOPY (EGD);  Surgeon: Charna Elizabeth, MD;  Location: [facility] ENDOSCOPY;  Service: Endoscopy;  Laterality: N/A;  . JOINT REPLACEMENT     bilat knee  . TONSILLECTOMY    . TOTAL KNEE REVISION  05/01/2012   Procedure: TOTAL KNEE REVISION;  Surgeon: Raymon Mutton, MD;  Location: [facility] OR;  Service: Orthopedics;  Laterality: Right;    OB History    No data available       Home Medications    Prior to Admission medications   Medication Sig Start Date End Date Taking? Authorizing Provider  azithromycin (ZITHROMAX) 250 MG tablet Take 1 tablet (250 mg total) by mouth daily. Take first 2 tablets together, then 1  every day until finished. 02/14/17   Kirichenko, Tatyana, PA-C  benzonatate (TESSALON) 100 MG capsule Take 1 capsule (100 mg total) by mouth every 8 (eight) hours. 02/14/17   Kirichenko, Tatyana, PA-C  bumetanide (BUMEX) 1 MG tablet Take 1 tablet (1 mg total) by mouth daily. Patient not taking: Reported on 10/18/2016 09/16/15   Charm Rings, MD  divalproex (DEPAKOTE ER) 250 MG 24 hr tablet Take 750 mg by mouth at bedtime. 12/10/16   [provider]  DULoxetine (CYMBALTA) 60 MG capsule Take 60 mg by mouth daily. 12/10/16   [provider]  levothyroxine (SYNTHROID, LEVOTHROID) 112 MCG tablet Take 112 mcg by mouth daily before breakfast.    [provider]  lithium carbonate 300 MG capsule Take 300 mg by mouth at bedtime. 12/10/16   [provider]  LORazepam (ATIVAN) 0.5 MG tablet Take 1 tablet (0.5 mg total) by mouth every 6 (six) hours as needed for anxiety (for acute aggitation and to help sleep). Patient not taking: Reported on 12/31/2016 06/04/15   Earney Navy, NP  meloxicam (MOBIC) 7.5 MG tablet Take 1 tablet (7.5 mg total) by mouth daily. Patient not taking: Reported on 12/31/2016 06/14/16   Charm Rings, MD  OLANZapine (ZYPREXA) 10 MG tablet Take 10 mg by mouth daily. 12/10/16   [provider]  omeprazole (PRILOSEC) 20 MG capsule Take 1 capsule (20 mg total)  by mouth daily. Patient not taking: Reported on 12/31/2016 09/16/15   Charm Rings, MD  omeprazole (PRILOSEC) 40 MG capsule Take 40 mg by mouth daily. 12/10/16   [provider]  ondansetron (ZOFRAN-ODT) 4 MG disintegrating tablet Take 1 tablet (4 mg total) by mouth every 8 (eight) hours as needed. 12/31/16   [name], MD  oseltamivir (TAMIFLU) 75 MG capsule Take 1 capsule (75 mg total) by mouth every 12 (twelve) hours. 02/14/17   Kirichenko, Lemont Fillers, PA-C  oxymetazoline (AFRIN NASAL SPRAY) 0.05 % nasal spray Place 1 spray into both nostrils 2 (two) times daily. Patient not taking:  Reported on 12/31/2016 03/26/16   Barrett Henle, PA-C    Family History Family History  Problem Relation Age of Onset  . Breast cancer Neg Hx     Social History Social History  Substance Use Topics  . Smoking status: Former Games developer  . Smokeless tobacco: Never Used  . Alcohol use No     Allergies   Patient has no known allergies.   Review of Systems Review of Systems  Constitutional: Negative for appetite change and fever.  HENT: Positive for dental problem. Negative for facial swelling and trouble swallowing.   Respiratory: Negative.   Cardiovascular: Negative.   Gastrointestinal: Negative.  Negative for nausea.  Musculoskeletal: Positive for arthralgias.       See HPI.  Skin: Negative.  Negative for color change.  Neurological: Negative.      Physical Exam Updated Vital Signs BP 135/77 (BP Location: Left Arm)   Pulse 66   Temp 98.3 F (36.8 C) (Oral)   Resp 16   Ht 5' (1.524 m)   Wt 100.7 kg (222 lb)   SpO2 99%   BMI 43.36 kg/m   Physical Exam  Constitutional: She is oriented to person, place, and time. She appears well-developed and well-nourished.  HENT:  She has an absent full, upper denture plate. Lower teeth do not have appreciated caries. No visualized abscess. Gums are generally tender.   Neck: Normal range of motion.  Pulmonary/Chest: Effort normal.  Musculoskeletal:  Right knee has well healed midline surgical scar. No significant swelling and no erythema. Joint stable.   Left knee has well healed midline surgical scar. No significant swelling and no erythema. Joint stable.   Neurological: She is alert and oriented to person, place, and time.  Skin: Skin is warm and dry.     ED Treatments / Results  Labs (all labs ordered are listed, but only abnormal results are displayed) Labs Reviewed - No data to display  EKG  EKG Interpretation None       Radiology No results found.  Procedures Procedures (including critical care  time)  Medications Ordered in ED Medications - No data to display   Initial Impression / Assessment and Plan / ED Course  I have reviewed the triage vital signs and the nursing notes.  Pertinent labs & imaging results that were available during my care of the patient were reviewed by me and considered in my medical decision making (see chart for details).     Patient with essentially chronic knee pain, no injury, no evidence of infection or septic joint. Pain is bilateral, R>L. Will refer back to her orthopedist.  No visualized dental abscesses with generalized gum tenderness. Will cover with penicillin and recommend follow up with dentistry.   Dr. Preston Fleeting has seen and evaluated the patient and feels discharge home is appropriate.  Final Clinical Impressions(s) / ED Diagnoses  Final diagnoses:  None   1. Bilateral knee pain 2. Dental pain  New Prescriptions New Prescriptions   No medications on file     Elpidio AnisUpstill, Ami Mally, Cordelia Poche-C 04/30/17 0325    Dione BoozeGlick, David, MD 04/30/17 [phone]

## 2017-04-20 NOTE — ED Triage Notes (Signed)
Pt to ED via [facility] with c/o right knee pain x's several years.  Also c/o gum pain.

## 2017-04-21 DIAGNOSIS — M25561 Pain in right knee: Secondary | ICD-10-CM | POA: Diagnosis not present

## 2017-04-26 DIAGNOSIS — T84032A Mechanical loosening of internal right knee prosthetic joint, initial encounter: Secondary | ICD-10-CM | POA: Diagnosis not present

## 2017-04-26 DIAGNOSIS — Z96653 Presence of artificial knee joint, bilateral: Secondary | ICD-10-CM | POA: Diagnosis not present

## 2017-04-26 DIAGNOSIS — T84092A Other mechanical complication of internal right knee prosthesis, initial encounter: Secondary | ICD-10-CM | POA: Diagnosis not present

## 2017-04-26 DIAGNOSIS — G8929 Other chronic pain: Secondary | ICD-10-CM | POA: Diagnosis not present

## 2017-04-26 DIAGNOSIS — Z471 Aftercare following joint replacement surgery: Secondary | ICD-10-CM | POA: Diagnosis not present

## 2017-04-26 DIAGNOSIS — M61461 Other calcification of muscle, right lower leg: Secondary | ICD-10-CM | POA: Diagnosis not present

## 2017-04-26 DIAGNOSIS — T84033A Mechanical loosening of internal left knee prosthetic joint, initial encounter: Secondary | ICD-10-CM | POA: Diagnosis not present

## 2017-04-27 DIAGNOSIS — M25512 Pain in left shoulder: Secondary | ICD-10-CM | POA: Diagnosis not present

## 2017-04-27 DIAGNOSIS — J309 Allergic rhinitis, unspecified: Secondary | ICD-10-CM | POA: Diagnosis not present

## 2017-04-27 DIAGNOSIS — R5383 Other fatigue: Secondary | ICD-10-CM | POA: Diagnosis not present

## 2017-04-27 DIAGNOSIS — M2550 Pain in unspecified joint: Secondary | ICD-10-CM | POA: Diagnosis not present

## 2017-04-27 DIAGNOSIS — E039 Hypothyroidism, unspecified: Secondary | ICD-10-CM | POA: Diagnosis not present

## 2017-04-27 DIAGNOSIS — R42 Dizziness and giddiness: Secondary | ICD-10-CM | POA: Diagnosis not present

## 2017-04-27 DIAGNOSIS — M25562 Pain in left knee: Secondary | ICD-10-CM | POA: Diagnosis not present

## 2017-05-04 DIAGNOSIS — Z87891 Personal history of nicotine dependence: Secondary | ICD-10-CM | POA: Diagnosis not present

## 2017-05-04 DIAGNOSIS — K219 Gastro-esophageal reflux disease without esophagitis: Secondary | ICD-10-CM | POA: Diagnosis not present

## 2017-05-04 DIAGNOSIS — E039 Hypothyroidism, unspecified: Secondary | ICD-10-CM | POA: Diagnosis not present

## 2017-05-04 DIAGNOSIS — G479 Sleep disorder, unspecified: Secondary | ICD-10-CM | POA: Diagnosis not present

## 2017-05-04 DIAGNOSIS — E538 Deficiency of other specified B group vitamins: Secondary | ICD-10-CM | POA: Diagnosis not present

## 2017-06-02 DIAGNOSIS — Z09 Encounter for follow-up examination after completed treatment for conditions other than malignant neoplasm: Secondary | ICD-10-CM | POA: Diagnosis not present

## 2017-06-16 ENCOUNTER — Other Ambulatory Visit: Payer: Self-pay

## 2017-06-16 DIAGNOSIS — I83813 Varicose veins of bilateral lower extremities with pain: Secondary | ICD-10-CM

## 2017-06-17 ENCOUNTER — Emergency Department (HOSPITAL_COMMUNITY)
Admission: EM | Admit: 2017-06-17 | Discharge: 2017-06-17 | Disposition: A | Discharge status: Home / Self Care | Payer: Medicare Other | Attending: Emergency Medicine | Admitting: Emergency Medicine

## 2017-06-17 ENCOUNTER — Encounter (HOSPITAL_COMMUNITY): Payer: Self-pay | Admitting: Emergency Medicine

## 2017-06-17 ENCOUNTER — Emergency Department (HOSPITAL_COMMUNITY): Payer: Medicare Other

## 2017-06-17 DIAGNOSIS — E039 Hypothyroidism, unspecified: Secondary | ICD-10-CM | POA: Diagnosis not present

## 2017-06-17 DIAGNOSIS — Z7983 Long term (current) use of bisphosphonates: Secondary | ICD-10-CM | POA: Diagnosis not present

## 2017-06-17 DIAGNOSIS — M25512 Pain in left shoulder: Secondary | ICD-10-CM | POA: Diagnosis not present

## 2017-06-17 DIAGNOSIS — R52 Pain, unspecified: Secondary | ICD-10-CM

## 2017-06-17 DIAGNOSIS — Z87891 Personal history of nicotine dependence: Secondary | ICD-10-CM | POA: Insufficient documentation

## 2017-06-17 DIAGNOSIS — Z79899 Other long term (current) drug therapy: Secondary | ICD-10-CM | POA: Diagnosis not present

## 2017-06-17 MED ORDER — NAPROXEN 250 MG PO TABS
250.0000 mg | ORAL_TABLET | Freq: Two times a day (BID) | ORAL | 0 refills | Status: DC
Start: 2017-06-17 — End: 2017-07-08

## 2017-06-17 NOTE — ED Provider Notes (Signed)
[facility]-EMERGENCY DEPT Provider Note   CSN: [id number] Arrival date & time: 06/17/17  0508     History   Chief Complaint Chief Complaint  Patient presents with  . Shoulder Pain    HPI Brenda Murray is a 66 y.o. female.  Brenda Murray is a 66 y.o. Female who presents to the emergency department complaining of 5-6 months of left shoulder pain that is worse with movement. She denies any known injury or trauma. She reports her pain is worse with movement and better with rest. She's been taking some Tylenol without relief of her symptoms. She denies falls, chest pain, shortness of breath, numbness, tingling or weakness.   The history is provided by the patient and medical records. No language interpreter was used.  Shoulder Pain   Pertinent negatives include no numbness.    Past Medical History:  Diagnosis Date  . Anxiety   . Bipolar disorder (HCC)   . Chronic back pain   . History of indigestion   . Hypothyroidism     Patient Active Problem List   Diagnosis Date Noted  . Nausea and vomiting 11/04/2014  . Hypothyroid 11/04/2014  . Metabolic alkalosis 11/04/2014  . Hypokalemia 11/04/2014  . Confusion 07/11/2013  . Depressive disorder, not elsewhere classified 07/11/2013  . Anxiety 07/11/2013    Past Surgical History:  Procedure Laterality Date  . carpel tunnel    . ESOPHAGOGASTRODUODENOSCOPY N/A 11/06/2014   Procedure: ESOPHAGOGASTRODUODENOSCOPY (EGD);  Surgeon: Charna Elizabeth, MD;  Location: [facility] ENDOSCOPY;  Service: Endoscopy;  Laterality: N/A;  . JOINT REPLACEMENT     bilat knee  . TONSILLECTOMY    . TOTAL KNEE REVISION  05/01/2012   Procedure: TOTAL KNEE REVISION;  Surgeon: Raymon Mutton, MD;  Location: [facility] OR;  Service: Orthopedics;  Laterality: Right;    OB History    No data available       Home Medications    Prior to Admission medications   Medication Sig Start Date End Date Taking? Authorizing Provider  benzonatate (TESSALON) 100 MG capsule Take 1  capsule (100 mg total) by mouth every 8 (eight) hours. 02/14/17   Kirichenko, Tatyana, PA-C  bumetanide (BUMEX) 1 MG tablet Take 1 tablet (1 mg total) by mouth daily. Patient not taking: Reported on 10/18/2016 09/16/15   Charm Rings, MD  divalproex (DEPAKOTE ER) 250 MG 24 hr tablet Take 750 mg by mouth at bedtime. 12/10/16   [provider]  DULoxetine (CYMBALTA) 60 MG capsule Take 60 mg by mouth daily. 12/10/16   [provider]  levothyroxine (SYNTHROID, LEVOTHROID) 112 MCG tablet Take 112 mcg by mouth daily before breakfast.    [provider]  lithium carbonate 300 MG capsule Take 300 mg by mouth at bedtime. 12/10/16   [provider]  LORazepam (ATIVAN) 0.5 MG tablet Take 1 tablet (0.5 mg total) by mouth every 6 (six) hours as needed for anxiety (for acute aggitation and to help sleep). Patient not taking: Reported on 12/31/2016 06/04/15   Earney Navy, NP  naproxen (NAPROSYN) 250 MG tablet Take 1 tablet (250 mg total) by mouth 2 (two) times daily with a meal. 06/17/17   Everlene Farrier, PA-C  OLANZapine (ZYPREXA) 10 MG tablet Take 10 mg by mouth daily. 12/10/16   [provider]  omeprazole (PRILOSEC) 20 MG capsule Take 1 capsule (20 mg total) by mouth daily. Patient not taking: Reported on 12/31/2016 09/16/15   Charm Rings, MD  omeprazole (PRILOSEC) 40 MG capsule  Take 40 mg by mouth daily. 12/10/16   [provider]  ondansetron (ZOFRAN-ODT) 4 MG disintegrating tablet Take 1 tablet (4 mg total) by mouth every 8 (eight) hours as needed. 12/31/16   Zadie RhineWickline, Donald, MD    Family History Family History  Problem Relation Age of Onset  . Breast cancer Neg Hx     Social History Social History  Substance Use Topics  . Smoking status: Former Games developermoker  . Smokeless tobacco: Never Used  . Alcohol use No     Allergies   Patient has no known allergies.   Review of Systems Review of Systems  Constitutional: Negative for fever.    Respiratory: Negative for cough and shortness of breath.   Cardiovascular: Negative for chest pain.  Musculoskeletal: Positive for arthralgias. Negative for back pain and neck pain.  Skin: Negative for color change, rash and wound.  Neurological: Negative for weakness and numbness.     Physical Exam Updated Vital Signs BP 132/85 (BP Location: Right Arm)   Pulse 89   Temp 98.5 F (36.9 C) (Oral)   Resp 18   SpO2 97%   Physical Exam  Constitutional: She appears well-developed and well-nourished. No distress.  HENT:  Head: Normocephalic and atraumatic.  Eyes: Right eye exhibits no discharge. Left eye exhibits no discharge.  Neck: Normal range of motion. Neck supple.  Cardiovascular: Normal rate, regular rhythm and intact distal pulses.   Bilaterally pulses are intact.  Pulmonary/Chest: Effort normal. No respiratory distress.  Musculoskeletal: Normal range of motion. She exhibits no edema, tenderness or deformity.  Patient reports some pain with range of motion of her left shoulder. She has good left shoulder range of motion. No clavicle tenderness bilaterally. No deformities noted to her upper extremities. No tenderness noted to her left elbow or wrist. No overlying skin changes to her left shoulder.  Neurological: She is alert. No sensory deficit. Coordination normal.  Sensation and strength is intact in her bilateral upper extremities.  Skin: Skin is warm and dry. Capillary refill takes less than 2 seconds. No rash noted. She is not diaphoretic. No erythema. No pallor.  Psychiatric: She has a normal mood and affect. Her behavior is normal.  Nursing note and vitals reviewed.    ED Treatments / Results  Labs (all labs ordered are listed, but only abnormal results are displayed) Labs Reviewed - No data to display  EKG  EKG Interpretation None       Radiology Dg Shoulder Left  Result Date: 06/17/2017 CLINICAL DATA:  66 y/o  F; left shoulder pain. EXAM: LEFT SHOULDER -  2+ VIEW COMPARISON:  None. FINDINGS: There is no evidence of fracture or dislocation. Glenohumeral joint and acromioclavicular joints are well maintained. Soft tissues are unremarkable. IMPRESSION: No acute fracture or dislocation. Glenohumeral and acromioclavicular joints are well maintained. Electronically Signed   By: Mitzi HansenLance  Furusawa-Stratton M.D.   On: 06/17/2017 06:00    Procedures Procedures (including critical care time)  Medications Ordered in ED Medications - No data to display   Initial Impression / Assessment and Plan / ED Course  I have reviewed the triage vital signs and the nursing notes.  Pertinent labs & imaging results that were available during my care of the patient were reviewed by me and considered in my medical decision making (see chart for details).    This  is a 66 y.o. Female who presents to the emergency department complaining of 5-6 months of left shoulder pain that is worse with movement.  She denies any known injury or trauma. She reports her pain is worse with movement and better with rest. On exam the patient is afebrile nontoxic appearing. She does have some pain with range of motion of her left shoulder. She has good left shoulder range of motion. No evidence of any septic joint. She has no overlying skin changes patches are neurovascularly intact. X-ray was obtained in triage. This is unremarkable. We'll have her follow-up with primary care and sports medicine for further evaluation of her left shoulder pain. I encouraged her to use ice and will prescribe a short course of naproxen for her pain. I advised the patient to follow-up with their primary care provider this week. I advised the patient to return to the emergency department with new or worsening symptoms or new concerns. The patient verbalized understanding and agreement with plan.      Final Clinical Impressions(s) / ED Diagnoses   Final diagnoses:  Pain  Acute pain of left shoulder    New  Prescriptions New Prescriptions   NAPROXEN (NAPROSYN) 250 MG TABLET    Take 1 tablet (250 mg total) by mouth 2 (two) times daily with a meal.     Everlene FarrierDansie, Aulton Routt, PA-C 06/17/17 [id number]    Margarita Grizzleay, Danielle, MD 06/17/17 1527

## 2017-06-17 NOTE — ED Triage Notes (Signed)
L shoulder pain ongoing xseveral months. Worse with movement, impacts her ADLs.

## 2017-07-06 ENCOUNTER — Ambulatory Visit (INDEPENDENT_AMBULATORY_CARE_PROVIDER_SITE_OTHER): Payer: Medicare Other | Admitting: Family Medicine

## 2017-07-06 VITALS — BP 124/82 | Ht 64.0 in | Wt 205.0 lb

## 2017-07-06 DIAGNOSIS — M25561 Pain in right knee: Secondary | ICD-10-CM | POA: Diagnosis not present

## 2017-07-06 DIAGNOSIS — M25562 Pain in left knee: Secondary | ICD-10-CM | POA: Diagnosis not present

## 2017-07-06 DIAGNOSIS — G8929 Other chronic pain: Secondary | ICD-10-CM

## 2017-07-06 DIAGNOSIS — Z96653 Presence of artificial knee joint, bilateral: Secondary | ICD-10-CM

## 2017-07-06 DIAGNOSIS — M7582 Other shoulder lesions, left shoulder: Secondary | ICD-10-CM

## 2017-07-06 DIAGNOSIS — G8928 Other chronic postprocedural pain: Secondary | ICD-10-CM

## 2017-07-06 MED ORDER — METHYLPREDNISOLONE ACETATE 40 MG/ML IJ SUSP
40.0000 mg | Freq: Once | INTRAMUSCULAR | Status: AC
  Administered 2017-07-06: 40 mg via INTRA_ARTICULAR

## 2017-07-06 NOTE — Progress Notes (Signed)
Chief complaint: Left shoulder pain 1 year, bilateral knee pain 1 year  History of present illness: Brenda Murray is a 66 year old female who presents to the sports medicine office today as a new patient, with chief complaint of left shoulder pain as well as bilateral knee pain. She reports that her left shoulder pain and arm pain is bothering her more so than her knee pain. She reports both of these issues have been bothering her for approximately 1 year. She does not report of any inciting incident, trauma, or injury to explain the pain.  In regards to her left arm and shoulder, she reports that she does not recall of any inciting factor. She reports pain in the left posterior and lateral aspect of her arm. She describes the pain as a sharp, aching, and throbbing pain. She reports pain being a 10/10 pain. She reports aggravating factors include any type of shoulder movement, specifically with abduction and external range of motion. She reports that because of "all the nerve medication I am on" she cannot take much in the way of pain medication, just takes Tylenol. She reports Tylenol does not have any interval improvement in her symptoms. She reports that the pain does wake her up at night. She reports of no warmth, erythema, ecchymosis, or effusion. She is right hand dominant. She does not report of any previous left shoulder injury. She does not report of any numbness, tingling, or weakness in her arm. She does report of radiation of pain going down the dorsal and lateral aspect of her left shoulder down to her posterior elbow. No pain going to her hands or fingers. She does not report of any neck pain. She recently had emergency department visit back on 06/17/17 for her left shoulder. X-ray of her shoulder was done at that time, did not show any acute bony deformity or abnormality. No gross evidence of acromioclavicular arthritis or glenohumeral arthritis. She was instructed to take Naprosyn and follow-up here  in the sports medicine office.  In regards to her bilateral knee pain, she reports that symptoms have been ongoing for approximately 1 year. As mentioned previously, she does not report of any inciting factor to cause the pain. She does report having history of bilateral knee replacement back in 2001 with [facility] orthoped[facility]. She reports that she did have increased pain in her right knee after the surgery, reports that she did have to have revision of the right knee shortly after the initial surgery. She does not recall the exact time frame of the revision. She reports since that time having an aching pain, as well as a sharp , stabbing, deep, and throbbing pain in both of her knees. She does not report of any radiation of pain. She describes the pain as a 10/10. She reports any type of knee flexion or knee extension, as well as standing on her feet for long periods of time causes pain. She reports that her left knee is slightly worse than her right knee at this time. She has had multiple emergency department visits for both her shoulder and her knees. She did have an emergency department visit back in May for her knees. She was told at that time she would need follow-up with orthopedic surgery. She reports that she did follow up with orthopedic surgery that week, she reports that the orthopedics surgeon told her she would need to have a revision of her left knee replacement. She reports that she is has been hesitant about it and in  fact her son told her not to have the surgery. She reports that [facility], but reports pain at the end and limiting her to do further walks. She does not report of any radiation of pain. She reports that she has had chronic bilateral lower leg pain, secondary to varicose veins. She reports that she does have an appointment with a vein specialist on September 12. She does not report of any numbness, tingling, or weakness in either of her lower  extremities. She has tried Tylenol, with no improvement in her symptoms. She does not report of any warmth, erythema, ecchymosis, she does report of slight effusion, mainly at the end the day. She does not report of any fevers, chills, or night sweats.  Past medical history: Bipolar disorder Hypothyroidism GERD Hypokalemia Lumbar degenerative disc disease  Past surgical history: Carpal tunnel tendon release bilaterally Bilateral knee replacement Tonsillectomy  Family history: She does not report a family history hypertension, type 2 diabetes, hyperlipidemia  Social history: She does report of former tobacco use, no alcohol use, no illicit drug use, is not working, lives at home by herself, son does live close by  Allergies: No known drug allergies  Review of systems: As above  Physical exam: Vital signs reviewed and are documented in chart General: Alert, oriented, appears stated age, in no apparent distress HEENT: Moist oral mucosa Respiratory: Normal respirations, able to speak in full sentences Cardiac: Normal peripheral pulses, regular rate Integumentary: No rashes on visible skin Neurologic: Strength is 4+/5 in bilateral upper extremity, also is 4+/5 in bilateral lower extremity, pain is limiting factor in her shoulder as well as in her knees, sensation is 2+ in bilateral upper and lower extremities Psychiatric: Appropriate affect and mood Musculoskeletal: Inspection of left shoulder reveals no obvious deformity, muscular atrophy, or asymmetry, no warmth, erythema, ecchymosis, or effusion noted, she is diffusely tender in the posterior and lateral aspect of her shoulder, no tenderness over cervical spine, distal clavicle, acromioclavicular joint, slightly tender to deep palpation over the head of biceps, range of motion with for flexion is to 150 on both sides, abduction on the left side is 90, right side is 120, internal and external range of motion is reduced to 45 on  left side, is 60 on the right side, impingement testing is positive as Neers, Hawkins are positive, cross arm is negative, Speed and Yerguson negative, Spurling negative; inspection of bilateral knees reveal no obvious bony deformity or muscle atrophy, no warmth, erythema, or ecchymosis noted, she does have slight, trace effusion in bilateral knees in suprapatellar region, no specific tenderness to palpation over the anterior knee, medial lateral joint line, as well as and popliteal fossa, no popliteal fossa fullness bilaterally, arc of range of motion is 10 to 120 degrees bilaterally  Procedure note: Prior to procedure, written informed consent was obtained, outlining benefits and risks, with risks including bleeding, infection, and steroid flare. After informed consent signed by the patient, Betadine was used to clean off the skin. Alcohol was additionally used. Using 25-gauge needle 3-1 mixture of lidocaine and Depo-Medrol was used to inject the subacromial bursa from posteriolateral approach, with injection site 1 cm inferior and medial to posterior acromion. No complications were noted. Band-Aid was applied afterward. Patient was observed for 5-10 minutes following injection.  Assessment and plan: 1. Left posterior and lateral shoulder pain, suspect rotator cuff tendinopathy involving her supraspinatus and infraspinatus muscle groups, with question of subscapularis involvement  2. Bilateral knee pain, status post bilateral total knee replacement and revision of right knee replacement 3. Chronic bilateral lower extremity leg pain, suspect secondary to varicose veins  Left shoulder pain -Discussed with patient that symptoms seem to be more consistent with rotator cuff tendinopathy, did personally and independently review x-ray imaging that was done in the emergency department, no evidence of any acute bony abnormality or any major osteo-arthritic changes seen -I discussed with her low suspicion of  cervical radiculopathy given Spurling was negative, no neck pain, or any symptoms of numbness, tingling, or weakness -I did discussed options today to include continuing with Tylenol, physical therapy, or cortisone injection to the left subacromial bursa, she is agreeable today to subacromial bursal injection to the left shoulder -She did not have any complications from the procedure today -Next step would be getting an MRI of her left shoulder if she does not have any interval improvement in her symptoms  Bilateral knee pain -I discussed with patient that this is more of an orthopedic issue, given that she does have history of bilateral knee replacement, with revision of her right knee replacement and being told by her orthopedic surgeon that she will need to have revision of her left knee -Do not have any suspicion for infection or any acute vascular issues otherwise to cause her pain -I discussed she will need to touch base with her orthopedic physician, as I do not have any additional recommendations for her knee -I discussed with her to continue taking Tylenol as needed for pain  At this point in time, will have patient follow-up on as-needed basis.  Haynes Kerns, MD Primary Care Sports Medicine Fellow [facility]

## 2017-07-08 ENCOUNTER — Emergency Department (HOSPITAL_COMMUNITY)
Admission: EM | Admit: 2017-07-08 | Discharge: 2017-07-08 | Disposition: A | Discharge status: Home / Self Care | Payer: Medicare Other | Attending: Emergency Medicine | Admitting: Emergency Medicine

## 2017-07-08 ENCOUNTER — Encounter (HOSPITAL_COMMUNITY): Payer: Self-pay | Admitting: Obstetrics and Gynecology

## 2017-07-08 DIAGNOSIS — L290 Pruritus ani: Secondary | ICD-10-CM | POA: Insufficient documentation

## 2017-07-08 DIAGNOSIS — K6289 Other specified diseases of anus and rectum: Secondary | ICD-10-CM

## 2017-07-08 DIAGNOSIS — R69 Illness, unspecified: Secondary | ICD-10-CM | POA: Diagnosis not present

## 2017-07-08 DIAGNOSIS — L299 Pruritus, unspecified: Secondary | ICD-10-CM

## 2017-07-08 DIAGNOSIS — Z79899 Other long term (current) drug therapy: Secondary | ICD-10-CM | POA: Insufficient documentation

## 2017-07-08 DIAGNOSIS — Z87891 Personal history of nicotine dependence: Secondary | ICD-10-CM | POA: Insufficient documentation

## 2017-07-08 LAB — CBC WITH DIFFERENTIAL/PLATELET
BASOS ABS: 0 10*3/uL (ref 0.0–0.1)
Basophils Relative: 0 %
Eosinophils Absolute: 0.1 10*3/uL (ref 0.0–0.7)
Eosinophils Relative: 1 %
HEMATOCRIT: 36.1 % (ref 36.0–46.0)
HEMOGLOBIN: 11.8 g/dL — AB (ref 12.0–15.0)
LYMPHS PCT: 24 %
Lymphs Abs: 1.5 10*3/uL (ref 0.7–4.0)
MCH: 26.2 pg (ref 26.0–34.0)
MCHC: 32.7 g/dL (ref 30.0–36.0)
MCV: 80 fL (ref 78.0–100.0)
MONO ABS: 0.7 10*3/uL (ref 0.1–1.0)
Monocytes Relative: 10 %
NEUTROS ABS: 4.1 10*3/uL (ref 1.7–7.7)
NEUTROS PCT: 65 %
Platelets: 285 10*3/uL (ref 150–400)
RBC: 4.51 MIL/uL (ref 3.87–5.11)
RDW: 14.3 % (ref 11.5–15.5)
WBC: 6.3 10*3/uL (ref 4.0–10.5)

## 2017-07-08 LAB — BASIC METABOLIC PANEL
ANION GAP: 9 (ref 5–15)
BUN: 23 mg/dL — ABNORMAL HIGH (ref 6–20)
CALCIUM: 10.2 mg/dL (ref 8.9–10.3)
CO2: 27 mmol/L (ref 22–32)
Chloride: 99 mmol/L — ABNORMAL LOW (ref 101–111)
Creatinine, Ser: 0.74 mg/dL (ref 0.44–1.00)
Glucose, Bld: 116 mg/dL — ABNORMAL HIGH (ref 65–99)
POTASSIUM: 4 mmol/L (ref 3.5–5.1)
Sodium: 135 mmol/L (ref 135–145)

## 2017-07-08 LAB — LITHIUM LEVEL: LITHIUM LVL: 0.39 mmol/L — AB (ref 0.60–1.20)

## 2017-07-08 MED ORDER — HYDROXYZINE HCL 25 MG PO TABS: 25.0000 mg | ORAL_TABLET | Freq: Four times a day (QID) | ORAL | 0 refills | Status: DC

## 2017-07-08 MED ORDER — MEBENDAZOLE 100 MG PO CHEW: 100.0000 mg | CHEWABLE_TABLET | Freq: Once | ORAL | 0 refills | Status: AC

## 2017-07-08 NOTE — ED Triage Notes (Signed)
Per EMS: Pt is coming from home For the past week the pt feels like something has been crawling in her rectum. Pt has just recently been started on lithium carbonate and has been more drowsy than normal. Her medication dose was uped and the medication changed, pt attempted to contact her psychologist with no response today.  Pt is also complaining of left arm pain

## 2017-07-08 NOTE — ED Notes (Signed)
Daughter in law [phone]

## 2017-07-08 NOTE — Discharge Instructions (Signed)
Take Mebendazole once. Repeat in 2 weeks Take Hydroxyzine for itching as needed Follow up with your doctor

## 2017-07-08 NOTE — ED Provider Notes (Signed)
[facility]-EMERGENCY DEPT Provider Note   CSN: [id number] Arrival date & time: 07/08/17  1711     History   Chief Complaint Chief Complaint  Patient presents with  . Rectal Discomfort    HPI Brenda Murray is a 66 y.o. female who presents with rectal itching and the sensation of something crawling in her rectum. PMH significant for bipolar d/o, anxiety, chronic shoulder and knee pain. She states that when she was admitted to [facility] in June her Lithium dose was changed. She started to have itching over the past week which has worsened over the past 2-3 days. She has generalized itching but it is worse in the rectum. She feels like something is crawling in the rectum. She has been using soaps and scubbing her bottom because it is very irritated. She has not noticed any abnormalities in her stools. She denies fever, chills, chest pain, SOB, abdominal pain, N/V/D, dysuria.   HPI  Past Medical History:  Diagnosis Date  . Anxiety   . Bipolar disorder (HCC)   . Chronic back pain   . History of indigestion   . Hypothyroidism     Patient Active Problem List   Diagnosis Date Noted  . Nausea and vomiting 11/04/2014  . Hypothyroid 11/04/2014  . Metabolic alkalosis 11/04/2014  . Hypokalemia 11/04/2014  . Confusion 07/11/2013  . Depressive disorder, not elsewhere classified 07/11/2013  . Anxiety 07/11/2013    Past Surgical History:  Procedure Laterality Date  . carpel tunnel    . ESOPHAGOGASTRODUODENOSCOPY N/A 11/06/2014   Procedure: ESOPHAGOGASTRODUODENOSCOPY (EGD);  Surgeon: Charna Elizabeth, MD;  Location: [facility] ENDOSCOPY;  Service: Endoscopy;  Laterality: N/A;  . JOINT REPLACEMENT     bilat knee  . TONSILLECTOMY    . TOTAL KNEE REVISION  05/01/2012   Procedure: TOTAL KNEE REVISION;  Surgeon: Raymon Mutton, MD;  Location: [facility] OR;  Service: Orthopedics;  Laterality: Right;    OB History    No data available       Home Medications    Prior to Admission medications   Medication Sig  Start Date End Date Taking? Authorizing Provider  atorvastatin (LIPITOR) 10 MG tablet TAKE 1 TABLET BY MOUTH EVERY DAY 03/27/17  Yes [provider]  DULoxetine (CYMBALTA) 60 MG capsule Take 60 mg by mouth at bedtime.  12/10/16  Yes [provider]  levothyroxine (SYNTHROID) 112 MCG tablet TAKE 1 TABLET BY MOUTH EVERY DAY 04/06/17  Yes [provider]  lithium carbonate 150 MG capsule TAKE 1 CAPSULE BY MOUTH 3 TIMES DAILY FOR 30 DAYS. DISCONTINUE 450 MG CAPSULES 06/23/17  Yes [provider]  loratadine (CLARITIN) 10 MG tablet Take 10 mg by mouth daily.   Yes [provider]  Multiple Vitamin (MULTIVITAMIN) tablet Take 1 tablet by mouth daily.   Yes [provider]  benzonatate (TESSALON) 100 MG capsule Take 1 capsule (100 mg total) by mouth every 8 (eight) hours. Patient not taking: Reported on 07/08/2017 02/14/17   Jaynie Crumble, PA-C  bumetanide (BUMEX) 1 MG tablet Take 1 tablet (1 mg total) by mouth daily. Patient not taking: Reported on 10/18/2016 09/16/15   Charm Rings, MD  LORazepam (ATIVAN) 0.5 MG tablet Take 1 tablet (0.5 mg total) by mouth every 6 (six) hours as needed for anxiety (for acute aggitation and to help sleep). Patient not taking: Reported on 12/31/2016 06/04/15   Earney Navy, NP  naproxen (NAPROSYN) 250 MG tablet Take 1 tablet (250 mg total) by mouth 2 (two)  times daily with a meal. Patient not taking: Reported on 07/08/2017 06/17/17   Everlene Farrier, PA-C  omeprazole (PRILOSEC) 20 MG capsule Take 1 capsule (20 mg total) by mouth daily. Patient not taking: Reported on 12/31/2016 09/16/15   Charm Rings, MD  ondansetron (ZOFRAN-ODT) 4 MG disintegrating tablet Take 1 tablet (4 mg total) by mouth every 8 (eight) hours as needed. Patient taking differently: Take 4 mg by mouth every 8 (eight) hours as needed for nausea or vomiting.  12/31/16   [name], MD    Family History Family History  Problem Relation Age of  Onset  . Breast cancer Neg Hx     Social History Social History  Substance Use Topics  . Smoking status: Former Games developer  . Smokeless tobacco: Never Used  . Alcohol use No     Allergies   Patient has no known allergies.   Review of Systems Review of Systems  Constitutional: Negative for fever.  Respiratory: Negative for shortness of breath.   Cardiovascular: Negative for chest pain.  Gastrointestinal: Negative for abdominal pain, blood in stool, diarrhea, nausea and vomiting.       Rectal irritation  Genitourinary: Negative for dysuria.     Physical Exam Updated Vital Signs BP (!) 143/100 (BP Location: Left Arm)   Pulse 80   Temp (!) 97.5 F (36.4 C) (Oral)   Resp 18   Ht 5\' 4"  (1.626 m)   Wt 93 kg (205 lb)   SpO2 97%   BMI 35.19 kg/m   Physical Exam  Constitutional: She is oriented to person, place, and time. She appears well-developed and well-nourished. No distress.  HENT:  Head: Normocephalic and atraumatic.  Eyes: Pupils are equal, round, and reactive to light. Conjunctivae are normal. Right eye exhibits no discharge. Left eye exhibits no discharge. No scleral icterus.  Neck: Normal range of motion.  Cardiovascular: Normal rate and regular rhythm.  Exam reveals no gallop and no friction rub.   No murmur heard. Pulmonary/Chest: Effort normal and breath sounds normal. No respiratory distress. She has no wheezes. She has no rales. She exhibits no tenderness.  Abdominal: Soft. Bowel sounds are normal. She exhibits no distension and no mass. There is no tenderness. There is no rebound and no guarding. No hernia.  Genitourinary:  Genitourinary Comments: Rectal: Several skin tags. No gross blood, hemorrhoids, fissures, redness, area of fluctuance, lesions, or tenderness. Chaperone present during exam.    Neurological: She is alert and oriented to person, place, and time.  Skin: Skin is warm and dry.  No rash. She is scratching her arms on exam  Psychiatric: She  has a normal mood and affect. Her behavior is normal.  Nursing note and vitals reviewed.    ED Treatments / Results  Labs (all labs ordered are listed, but only abnormal results are displayed) Labs Reviewed  LITHIUM LEVEL - Abnormal; Notable for the following:       Result Value   Lithium Lvl 0.39 (*)    All other components within normal limits  BASIC METABOLIC PANEL - Abnormal; Notable for the following:    Chloride 99 (*)    Glucose, Bld 116 (*)    BUN 23 (*)    All other components within normal limits  CBC WITH DIFFERENTIAL/PLATELET - Abnormal; Notable for the following:    Hemoglobin 11.8 (*)    All other components within normal limits    EKG  EKG Interpretation None       Radiology No  results found.  Procedures Procedures (including critical care time)  Medications Ordered in ED Medications - No data to display   Initial Impression / Assessment and Plan / ED Course  I have reviewed the triage vital signs and the nursing notes.  Pertinent labs & imaging results that were available during my care of the patient were reviewed by me and considered in my medical decision making (see chart for details).  66 year old female with the sensation of something crawling in her rectum and generalized itching. She is activity scratching herself on exam. No obvious rash. Possibly related to medicine or psychiatric condition but hard to tell because she is a difficult historian. Labs are unremarkable. Lithium is subtherapeutic. Will treat for possible pinworms with Mebendazole and Atarax and have her f/u with PCP.   Final Clinical Impressions(s) / ED Diagnoses   Final diagnoses:  Rectal discomfort  Itching    New Prescriptions New Prescriptions   No medications on file     Beryle Quant 07/08/17 2252    Maia Plan, MD 07/08/17 [phone]

## 2017-07-26 ENCOUNTER — Encounter (HOSPITAL_COMMUNITY): Payer: Self-pay | Admitting: Emergency Medicine

## 2017-07-26 ENCOUNTER — Emergency Department (HOSPITAL_COMMUNITY)
Admission: EM | Admit: 2017-07-26 | Discharge: 2017-07-26 | Disposition: A | Discharge status: Home / Self Care | Payer: Medicare Other | Attending: Emergency Medicine | Admitting: Emergency Medicine

## 2017-07-26 DIAGNOSIS — Z79899 Other long term (current) drug therapy: Secondary | ICD-10-CM | POA: Insufficient documentation

## 2017-07-26 DIAGNOSIS — R51 Headache: Secondary | ICD-10-CM | POA: Diagnosis not present

## 2017-07-26 DIAGNOSIS — F319 Bipolar disorder, unspecified: Secondary | ICD-10-CM | POA: Insufficient documentation

## 2017-07-26 DIAGNOSIS — F419 Anxiety disorder, unspecified: Secondary | ICD-10-CM | POA: Insufficient documentation

## 2017-07-26 DIAGNOSIS — E039 Hypothyroidism, unspecified: Secondary | ICD-10-CM | POA: Diagnosis not present

## 2017-07-26 DIAGNOSIS — R519 Headache, unspecified: Secondary | ICD-10-CM

## 2017-07-26 DIAGNOSIS — K6289 Other specified diseases of anus and rectum: Secondary | ICD-10-CM

## 2017-07-26 DIAGNOSIS — Z87891 Personal history of nicotine dependence: Secondary | ICD-10-CM | POA: Diagnosis not present

## 2017-07-26 MED ORDER — KETOROLAC TROMETHAMINE 15 MG/ML IJ SOLN
15.0000 mg | Freq: Once | INTRAMUSCULAR | Status: AC
  Administered 2017-07-26: 15 mg via INTRAVENOUS
  Filled 2017-07-26: qty 1

## 2017-07-26 MED ORDER — METOCLOPRAMIDE HCL 5 MG/ML IJ SOLN
10.0000 mg | Freq: Once | INTRAMUSCULAR | Status: AC
  Administered 2017-07-26: 10 mg via INTRAVENOUS
  Filled 2017-07-26: qty 2

## 2017-07-26 MED ORDER — DIPHENHYDRAMINE HCL 50 MG/ML IJ SOLN
25.0000 mg | Freq: Once | INTRAMUSCULAR | Status: AC
  Administered 2017-07-26: 25 mg via INTRAVENOUS
  Filled 2017-07-26: qty 1

## 2017-07-26 MED ORDER — SODIUM CHLORIDE 0.9 % IV BOLUS (SEPSIS)
500.0000 mL | Freq: Once | INTRAVENOUS | Status: AC
  Administered 2017-07-26: 500 mL via INTRAVENOUS

## 2017-07-26 MED ORDER — DEXAMETHASONE SODIUM PHOSPHATE 10 MG/ML IJ SOLN
10.0000 mg | Freq: Once | INTRAMUSCULAR | Status: AC
  Administered 2017-07-26: 10 mg via INTRAVENOUS
  Filled 2017-07-26: qty 1

## 2017-07-26 NOTE — Discharge Instructions (Signed)
Please make appointment with GI Return for worsening symptoms

## 2017-07-26 NOTE — ED Provider Notes (Signed)
[facility]-EMERGENCY DEPT Provider Note   CSN: [id number] Arrival date & time: 07/26/17  1040     History   Chief Complaint Chief Complaint  Patient presents with  . Headache  . Rectal Pain    HPI Brenda Murray is a 66 y.o. female who presents with a headache and rectal discomfort. She was seen by me on 8/17 for similar complaints. She states she never took the medicine prescribed to her because she didn't think she had worms. The crawling sensation is constant and unchanged. It is on the "outside and inside" of the rectum. She is having a regular BM every day without difficulty. She denies seeing anything in her stool consistent with worms although one time she saw a "long thing" but didn't think it was a worm. She also has had a headache for the past several days. It is similar to prior headaches. It is diffuse and constant. She has associated nausea without vomiting. No abdominal pain. Denies fever, LOC, trauma, neck stiffness, acute onset, worst headache of life, different from previous HA.   HPI  Past Medical History:  Diagnosis Date  . Anxiety   . Bipolar disorder (HCC)   . Chronic back pain   . History of indigestion   . Hypothyroidism     Patient Active Problem List   Diagnosis Date Noted  . Nausea and vomiting 11/04/2014  . Hypothyroid 11/04/2014  . Metabolic alkalosis 11/04/2014  . Hypokalemia 11/04/2014  . Confusion 07/11/2013  . Depressive disorder, not elsewhere classified 07/11/2013  . Anxiety 07/11/2013    Past Surgical History:  Procedure Laterality Date  . carpel tunnel    . ESOPHAGOGASTRODUODENOSCOPY N/A 11/06/2014   Procedure: ESOPHAGOGASTRODUODENOSCOPY (EGD);  Surgeon: Charna Elizabeth, MD;  Location: [facility] ENDOSCOPY;  Service: Endoscopy;  Laterality: N/A;  . JOINT REPLACEMENT     bilat knee  . TONSILLECTOMY    . TOTAL KNEE REVISION  05/01/2012   Procedure: TOTAL KNEE REVISION;  Surgeon: Raymon Mutton, MD;  Location: [facility] OR;  Service: Orthopedics;  Laterality:  Right;    OB History    No data available       Home Medications    Prior to Admission medications   Medication Sig Start Date End Date Taking? Authorizing Provider  atorvastatin (LIPITOR) 10 MG tablet TAKE 1 TABLET BY MOUTH EVERY DAY 03/27/17   [provider]  DULoxetine (CYMBALTA) 60 MG capsule Take 60 mg by mouth at bedtime.  12/10/16   [provider]  hydrOXYzine (ATARAX/VISTARIL) 25 MG tablet Take 1 tablet (25 mg total) by mouth every 6 (six) hours. 07/08/17   Bethel Born, PA-C  levothyroxine (SYNTHROID) 112 MCG tablet TAKE 1 TABLET BY MOUTH EVERY DAY 04/06/17   [provider]  lithium carbonate 150 MG capsule TAKE 1 CAPSULE BY MOUTH 3 TIMES DAILY FOR 30 DAYS. DISCONTINUE 450 MG CAPSULES 06/23/17   [provider]  loratadine (CLARITIN) 10 MG tablet Take 10 mg by mouth daily.    [provider]  Multiple Vitamin (MULTIVITAMIN) tablet Take 1 tablet by mouth daily.    [provider]  ondansetron (ZOFRAN-ODT) 4 MG disintegrating tablet Take 1 tablet (4 mg total) by mouth every 8 (eight) hours as needed. Patient taking differently: Take 4 mg by mouth every 8 (eight) hours as needed for nausea or vomiting.  12/31/16   [name], MD    Family History Family History  Problem Relation Age of Onset  . Breast cancer Neg Hx  Social History Social History  Substance Use Topics  . Smoking status: Former Games developermoker  . Smokeless tobacco: Never Used  . Alcohol use No     Allergies   Patient has no known allergies.   Review of Systems Review of Systems  Constitutional: Negative for fever.  Eyes: Negative for visual disturbance.  Gastrointestinal: Positive for nausea. Negative for abdominal pain, blood in stool, constipation, diarrhea, rectal pain and vomiting.       +crawling sensation  Neurological: Positive for headaches. Negative for syncope.  All other systems reviewed and are negative.    Physical  Exam Updated Vital Signs BP 119/70   Pulse 60   Temp 98 F (36.7 C) (Oral)   Resp 17   SpO2 96%   Physical Exam  Constitutional: She is oriented to person, place, and time. She appears well-developed and well-nourished. No distress.  HENT:  Head: Normocephalic and atraumatic.  Eyes: Pupils are equal, round, and reactive to light. Conjunctivae are normal. Right eye exhibits no discharge. Left eye exhibits no discharge. No scleral icterus.  Neck: Normal range of motion.  Cardiovascular: Normal rate and regular rhythm.  Exam reveals no gallop and no friction rub.   No murmur heard. Pulmonary/Chest: Effort normal and breath sounds normal. No respiratory distress. She has no wheezes. She has no rales. She exhibits no tenderness.  Abdominal: Soft. Bowel sounds are normal. She exhibits no distension and no mass. There is no tenderness. There is no rebound and no guarding. No hernia.  Genitourinary:  Genitourinary Comments: Rectal: Skin tag again noted. No gross blood, hemorrhoids, fissures, redness, area of fluctuance, lesions, or tenderness. Chaperone present during exam.   Neurological: She is alert and oriented to person, place, and time.  Lying on stretcher in NAD. GCS 15. Speaks in a clear voice. Cranial nerves II through XII grossly intact. 5/5 strength in all extremities. Sensation fully intact.  Bilateral finger-to-nose intact. Ambulatory    Skin: Skin is warm and dry.  Psychiatric: She has a normal mood and affect. Her behavior is normal.  Nursing note and vitals reviewed.    ED Treatments / Results  Labs (all labs ordered are listed, but only abnormal results are displayed) Labs Reviewed - No data to display  EKG  EKG Interpretation None       Radiology No results found.  Procedures Procedures (including critical care time)  Medications Ordered in ED Medications  sodium chloride 0.9 % bolus 500 mL (500 mLs Intravenous New Bag/Given 07/26/17 1422)  ketorolac  (TORADOL) 15 MG/ML injection 15 mg (15 mg Intravenous Given 07/26/17 1423)  diphenhydrAMINE (BENADRYL) injection 25 mg (25 mg Intravenous Given 07/26/17 1423)  metoCLOPramide (REGLAN) injection 10 mg (10 mg Intravenous Given 07/26/17 1422)  dexamethasone (DECADRON) injection 10 mg (10 mg Intravenous Given 07/26/17 1424)     Initial Impression / Assessment and Plan / ED Course  I have reviewed the triage vital signs and the nursing notes.  Pertinent labs & imaging results that were available during my care of the patient were reviewed by me and considered in my medical decision making (see chart for details).  66 year old female presents with a headache similar to prior headaches and ongoing sensation of "crawling" in the rectum. Unclear etiology. At this point, I think a GI evaluation is warranted although may be due to an underlying psychiatric disorder. Will refer to GI. She states Atarax did help with itching but she does not want a refill of this medicine. Additionally,  migraine cocktail was given. She reports significant relief of her headache and nausea. Will d/c with return precautions.  Final Clinical Impressions(s) / ED Diagnoses   Final diagnoses:  Rectal discomfort  Bad headache    New Prescriptions New Prescriptions   No medications on file     Beryle Quant 07/26/17 1526    Loren Racer, MD 07/31/17 [phone]

## 2017-07-26 NOTE — ED Triage Notes (Signed)
Pt reports headache x2 days and reports feeling like something is "crawling out of my rectum" pt denies any rectal bleeding. [facility], takes lithium, states she has been taking meds as prescribed.

## 2017-08-01 ENCOUNTER — Encounter: Payer: Self-pay | Admitting: Vascular Surgery

## 2017-08-03 ENCOUNTER — Inpatient Hospital Stay (HOSPITAL_COMMUNITY): Admission: RE | Admit: 2017-08-03 | Payer: Self-pay | Source: Ambulatory Visit

## 2017-08-03 ENCOUNTER — Encounter: Payer: Medicare Other | Admitting: Vascular Surgery

## 2017-09-05 ENCOUNTER — Encounter: Payer: Medicare Other | Admitting: Vascular Surgery

## 2017-09-09 DIAGNOSIS — R52 Pain, unspecified: Secondary | ICD-10-CM | POA: Diagnosis not present

## 2017-09-09 DIAGNOSIS — K6289 Other specified diseases of anus and rectum: Secondary | ICD-10-CM | POA: Diagnosis not present

## 2017-09-09 DIAGNOSIS — Z23 Encounter for immunization: Secondary | ICD-10-CM | POA: Diagnosis not present

## 2017-09-09 DIAGNOSIS — E039 Hypothyroidism, unspecified: Secondary | ICD-10-CM | POA: Diagnosis not present

## 2017-09-09 DIAGNOSIS — E782 Mixed hyperlipidemia: Secondary | ICD-10-CM | POA: Diagnosis not present

## 2017-09-09 DIAGNOSIS — R05 Cough: Secondary | ICD-10-CM | POA: Diagnosis not present

## 2017-09-12 ENCOUNTER — Other Ambulatory Visit: Payer: Self-pay | Admitting: Internal Medicine

## 2017-09-12 DIAGNOSIS — Z1231 Encounter for screening mammogram for malignant neoplasm of breast: Secondary | ICD-10-CM

## 2017-09-13 DIAGNOSIS — R140 Abdominal distension (gaseous): Secondary | ICD-10-CM | POA: Diagnosis not present

## 2017-09-13 DIAGNOSIS — K140 Glossitis: Secondary | ICD-10-CM | POA: Diagnosis not present

## 2017-09-13 DIAGNOSIS — K5730 Diverticulosis of large intestine without perforation or abscess without bleeding: Secondary | ICD-10-CM | POA: Diagnosis not present

## 2017-09-13 DIAGNOSIS — K7581 Nonalcoholic steatohepatitis (NASH): Secondary | ICD-10-CM | POA: Diagnosis not present

## 2017-10-07 DIAGNOSIS — R0989 Other specified symptoms and signs involving the circulatory and respiratory systems: Secondary | ICD-10-CM | POA: Diagnosis not present

## 2017-11-04 DIAGNOSIS — H40013 Open angle with borderline findings, low risk, bilateral: Secondary | ICD-10-CM | POA: Diagnosis not present

## 2017-11-10 ENCOUNTER — Ambulatory Visit (INDEPENDENT_AMBULATORY_CARE_PROVIDER_SITE_OTHER): Payer: Medicare Other | Admitting: Podiatry

## 2017-11-10 VITALS — BP 96/61 | HR 62 | Ht 60.0 in | Wt 200.0 lb

## 2017-11-10 DIAGNOSIS — G5763 Lesion of plantar nerve, bilateral lower limbs: Secondary | ICD-10-CM | POA: Diagnosis not present

## 2017-11-10 DIAGNOSIS — L84 Corns and callosities: Secondary | ICD-10-CM | POA: Diagnosis not present

## 2017-11-10 DIAGNOSIS — F250 Schizoaffective disorder, bipolar type: Secondary | ICD-10-CM | POA: Insufficient documentation

## 2017-11-10 NOTE — Progress Notes (Signed)
Subjective:  Patient ID: Brenda Murray, female    DOB: 02-16-1951,  MRN: [id number]  Chief Complaint  Patient presents with  . Callouses    bilateral great toes  . Difficulty Walking    discuss best shoes to wear  . Numbness    non diabetic numbness and tingling in feet   66 y.o. female presents with the above complaint.  Reports calluses to both feet.  Reports lower leg swelling to both legs.  Would like to discuss which she is to wear.  Also complains of numbness to both feet. Past Medical History:  Diagnosis Date  . Anxiety   . Bipolar disorder (HCC)   . Chronic back pain   . History of indigestion   . Hypothyroidism    Past Surgical History:  Procedure Laterality Date  . carpel tunnel    . ESOPHAGOGASTRODUODENOSCOPY N/A 11/06/2014   Procedure: ESOPHAGOGASTRODUODENOSCOPY (EGD);  Surgeon: Charna ElizabethJyothi Mann, MD;  [facility];  Service: Endoscopy;  Laterality: N/A;  . JOINT REPLACEMENT     bilat knee  . TONSILLECTOMY    . TOTAL KNEE REVISION  05/01/2012   Procedure: TOTAL KNEE REVISION;  Surgeon: Raymon MuttonStephen D Lucey, MD;  Location: [facility] OR;  Service: Orthopedics;  Laterality: Right;    Current Outpatient Medications:  .  oxymetazoline (PX NASAL SPRAY MOISTURIZING) 0.05 % nasal spray, Place into the nose., Disp: , Rfl:  .  albuterol (PROAIR HFA) 108 (90 Base) MCG/ACT inhaler, Inhale into the lungs., Disp: , Rfl:  .  atorvastatin (LIPITOR) 10 MG tablet, TAKE 1 TABLET BY MOUTH EVERY DAY, Disp: , Rfl:  .  benzonatate (TESSALON) 100 MG capsule, Take 100 mg by mouth., Disp: , Rfl:  .  Cholecalciferol (VITAMIN D3) 2000 units capsule, Take by mouth., Disp: , Rfl:  .  DULoxetine (CYMBALTA) 60 MG capsule, Take 60 mg by mouth at bedtime. , Disp: , Rfl:  .  hydrOXYzine (ATARAX/VISTARIL) 25 MG tablet, Take 1 tablet (25 mg total) by mouth every 6 (six) hours., Disp: 12 tablet, Rfl: 0 .  ketotifen (ZADITOR) 0.025 % ophthalmic solution, Apply to eye., Disp: , Rfl:  .  levothyroxine  (SYNTHROID) 112 MCG tablet, TAKE 1 TABLET BY MOUTH EVERY DAY, Disp: , Rfl:  .  lithium carbonate 150 MG capsule, TAKE 1 CAPSULE BY MOUTH 3 TIMES DAILY FOR 30 DAYS. DISCONTINUE 450 MG CAPSULES, Disp: , Rfl: 0 .  loratadine (CLARITIN) 10 MG tablet, Take 10 mg by mouth daily., Disp: , Rfl:  .  LORazepam (ATIVAN) 1 MG tablet, Take 1 mg by mouth at bedtime., Disp: , Rfl:  .  mometasone (NASONEX) 50 MCG/ACT nasal spray, 2 sprays daily., Disp: , Rfl: 5 .  Multiple Vitamin (MULTIVITAMIN) tablet, Take 1 tablet by mouth daily., Disp: , Rfl:  .  OLANZapine (ZYPREXA) 10 MG tablet, Take 10 mg by mouth., Disp: , Rfl:  .  omeprazole (PRILOSEC) 40 MG capsule, Take 40 mg by mouth daily as needed for heartburn., Disp: , Rfl:  .  sertraline (ZOLOFT) 50 MG tablet, Take 50 mg by mouth., Disp: , Rfl:   No Known Allergies Review of Systems Objective:   Vitals:   11/10/17 0953  BP: 96/61  Pulse: 62   General AA&O x3. Normal mood and affect.  Vascular Dorsalis pedis and posterior tibial pulses  present 2+ bilaterally  Capillary refill normal to all digits. Pedal hair growth normal.  Neurologic Epicritic sensation grossly present.  Dermatologic No open lesions. Interspaces clear of maceration. Nails well  groomed and normal in appearance. Pinch callus helices bilateral  Orthopedic: MMT 5/5 in dorsiflexion, plantarflexion, inversion, and eversion. Normal joint ROM without pain or crepitus. Pain to palpation third interspace bilateral with palpable Mulder's click   Assessment & Plan:  Patient was evaluated and treated and all questions answered.  Neuroma -Educated on etiology.  Pinch calluses both great toes -Courtesy debridement today  Return in about 3 weeks (around 12/01/2017).

## 2017-11-21 ENCOUNTER — Encounter: Payer: Medicare Other | Admitting: Vascular Surgery

## 2017-12-01 ENCOUNTER — Ambulatory Visit (INDEPENDENT_AMBULATORY_CARE_PROVIDER_SITE_OTHER): Payer: Medicare Other | Admitting: Podiatry

## 2017-12-01 DIAGNOSIS — G5763 Lesion of plantar nerve, bilateral lower limbs: Secondary | ICD-10-CM | POA: Diagnosis not present

## 2017-12-06 ENCOUNTER — Other Ambulatory Visit: Payer: Self-pay

## 2017-12-06 ENCOUNTER — Encounter (HOSPITAL_COMMUNITY): Payer: Self-pay | Admitting: Emergency Medicine

## 2017-12-06 ENCOUNTER — Ambulatory Visit (HOSPITAL_COMMUNITY)
Admission: EM | Admit: 2017-12-06 | Discharge: 2017-12-06 | Disposition: A | Discharge status: Home / Self Care | Payer: Medicare Other | Attending: Family Medicine | Admitting: Family Medicine

## 2017-12-06 DIAGNOSIS — J069 Acute upper respiratory infection, unspecified: Secondary | ICD-10-CM

## 2017-12-06 MED ORDER — IBUPROFEN 800 MG PO TABS
ORAL_TABLET | ORAL | Status: AC
  Filled 2017-12-06: qty 1

## 2017-12-06 MED ORDER — SALINE SPRAY 0.65 % NA SOLN: 1.0000 | NASAL | 0 refills | Status: AC | PRN

## 2017-12-06 MED ORDER — IBUPROFEN 800 MG PO TABS
800.0000 mg | ORAL_TABLET | Freq: Once | ORAL | Status: AC
  Administered 2017-12-06: 800 mg via ORAL

## 2017-12-06 MED ORDER — GUAIFENESIN ER 600 MG PO TB12: 600.0000 mg | ORAL_TABLET | Freq: Two times a day (BID) | ORAL | 0 refills | Status: DC

## 2017-12-06 NOTE — ED Provider Notes (Signed)
[facility]    CSN: [id number] Arrival date & time: 12/06/17  1022     History   Chief Complaint Chief Complaint  Patient presents with  . URI    HPI Brenda Murray is a 67 y.o. female.   Brenda Murray presents with complaints of facial pressure, nasal congestion and runny nose which started approximately two days ago. She took ibuprofen yesterday which mildly helped. No known ill contacts. States she does take flonase daily. Has had a few small nose bleeds. Without sore throat. Without ear pain but with bilateral parotid edema, patient states this is not new for her. Without fevers. Occasional cough which is not new. Has not taken any other medications for symptoms. History oc anxiety, bipolar, hypothyroid. Medications reviewed.   ROS per HPI.       Past Medical History:  Diagnosis Date  . Anxiety   . Bipolar disorder (HCC)   . Chronic back pain   . History of indigestion   . Hypothyroidism     Patient Active Problem List   Diagnosis Date Noted  . Schizoaffective disorder, bipolar type (HCC) 11/10/2017  . Hoarseness 11/09/2016  . Parotid swelling 03/22/2016  . Nausea and vomiting 11/04/2014  . Hypothyroid 11/04/2014  . Metabolic alkalosis 11/04/2014  . Hypokalemia 11/04/2014  . Knee joint replaced by other means 07/16/2014  . Confusion 07/11/2013  . Depressive disorder, not elsewhere classified 07/11/2013  . Anxiety 07/11/2013  . Vitamin B12 deficiency 06/28/2013  . Gastroesophageal reflux disease without esophagitis 06/28/2013  . Anal pruritus 06/28/2013    Past Surgical History:  Procedure Laterality Date  . carpel tunnel    . ESOPHAGOGASTRODUODENOSCOPY N/A 11/06/2014   Procedure: ESOPHAGOGASTRODUODENOSCOPY (EGD);  Surgeon: Charna Elizabeth, MD;  Location: [facility] ENDOSCOPY;  Service: Endoscopy;  Laterality: N/A;  . JOINT REPLACEMENT     bilat knee  . TONSILLECTOMY    . TOTAL KNEE REVISION  05/01/2012   Procedure: TOTAL KNEE REVISION;  Surgeon: Raymon Mutton, MD;  Location: [facility] OR;  Service: Orthopedics;  Laterality: Right;    OB History    No data available       Home Medications    Prior to Admission medications   Medication Sig Start Date End Date Taking? Authorizing Provider  albuterol (PROAIR HFA) 108 (90 Base) MCG/ACT inhaler Inhale into the lungs.    [provider]  atorvastatin (LIPITOR) 10 MG tablet TAKE 1 TABLET BY MOUTH EVERY DAY 03/27/17   [provider]  benzonatate (TESSALON) 100 MG capsule Take 100 mg by mouth.    [provider]  Cholecalciferol (VITAMIN D3) 2000 units capsule Take by mouth.    [provider]  DULoxetine (CYMBALTA) 60 MG capsule Take 60 mg by mouth at bedtime.  12/10/16   [provider]  guaiFENesin (MUCINEX) 600 MG 12 hr tablet Take 1 tablet (600 mg total) by mouth 2 (two) times daily. 12/06/17   Georgetta Haber, NP  hydrOXYzine (ATARAX/VISTARIL) 25 MG tablet Take 1 tablet (25 mg total) by mouth every 6 (six) hours. 07/08/17   Bethel Born, PA-C  ketotifen (ZADITOR) 0.025 % ophthalmic solution Apply to eye.    [provider]  levothyroxine (SYNTHROID) 112 MCG tablet TAKE 1 TABLET BY MOUTH EVERY DAY 04/06/17   [provider]  lithium carbonate 150 MG capsule TAKE 1 CAPSULE BY MOUTH 3 TIMES DAILY FOR 30 DAYS. DISCONTINUE 450 MG CAPSULES 06/23/17   [provider]  loratadine (CLARITIN) 10 MG tablet  Take 10 mg by mouth daily.    [provider]  LORazepam (ATIVAN) 1 MG tablet Take 1 mg by mouth at bedtime. 07/23/17   [provider]  mometasone (NASONEX) 50 MCG/ACT nasal spray 2 sprays daily. 08/29/17   [provider]  Multiple Vitamin (MULTIVITAMIN) tablet Take 1 tablet by mouth daily.    [provider]  OLANZapine (ZYPREXA) 10 MG tablet Take 10 mg by mouth.    [provider]  omeprazole (PRILOSEC) 40 MG capsule Take 40 mg by mouth daily as needed for heartburn. 07/25/17   [provider]  oxymetazoline (PX NASAL SPRAY MOISTURIZING) 0.05 % nasal spray Place into the nose. 03/26/16   [provider]  sertraline (ZOLOFT) 50 MG tablet Take 50 mg by mouth.    [provider]  sodium chloride (OCEAN) 0.65 % SOLN nasal spray Place 1 spray into both nostrils as needed for congestion. 12/06/17   Georgetta HaberBurky, [place] Klas B, NP    Family History Family History  Problem Relation Age of Onset  . Breast cancer Neg Hx     Social History Social History   Tobacco Use  . Smoking status: Former Games developermoker  . Smokeless tobacco: Never Used  Substance Use Topics  . Alcohol use: No  . Drug use: No     Allergies   Patient has no known allergies.   Review of Systems Review of Systems   Physical Exam Triage Vital Signs ED Triage Vitals  Enc Vitals Group     BP 12/06/17 1129 122/70     Pulse Rate 12/06/17 1129 87     Resp 12/06/17 1129 20     Temp 12/06/17 1129 (!) 97.2 F (36.2 C)     Temp Source 12/06/17 1129 Oral     SpO2 12/06/17 1129 100 %     Weight --      Height --      Head Circumference --      Peak Flow --      Pain Score 12/06/17 1128 10     Pain Loc --      Pain Edu? --      Excl. in GC? --    No data found.  Updated Vital Signs BP 122/70 (BP Location: Left Arm)   Pulse 87   Temp (!) 97.2 F (36.2 C) (Oral)   Resp 20   SpO2 100%   Visual Acuity Right Eye Distance:   Left Eye Distance:   Bilateral Distance:    Right Eye Near:   Left Eye Near:    Bilateral Near:     Physical Exam  Constitutional: She is oriented to person, place, and time. She appears well-developed and well-nourished. No distress.  HENT:  Head: Normocephalic and atraumatic.  Right Ear: Tympanic membrane, external ear and ear canal normal.  Left Ear: Tympanic membrane, external ear and ear canal normal.  Nose: Mucosal edema present. Right sinus exhibits frontal sinus tenderness. Left sinus exhibits frontal sinus tenderness.  Mouth/Throat: Uvula is midline,  oropharynx is clear and moist and mucous membranes are normal. No tonsillar exudate.  Bilateral parotid edema present  Eyes: Conjunctivae and EOM are normal. Pupils are equal, round, and reactive to light.  Cardiovascular: Normal rate, regular rhythm and normal heart sounds.  Pulmonary/Chest: Effort normal and breath sounds normal.  Neurological: She is alert and oriented to person, place, and time.  Skin: Skin is warm and dry.     UC Treatments / Results  Labs (  all labs ordered are listed, but only abnormal results are displayed) Labs Reviewed - No data to display  EKG  EKG Interpretation None       Radiology No results found.  Procedures Procedures (including critical care time)  Medications Ordered in UC Medications  ibuprofen (ADVIL,MOTRIN) tablet 800 mg (not administered)     Initial Impression / Assessment and Plan / UC Course  I have reviewed the triage vital signs and the nursing notes.  Pertinent labs & imaging results that were available during my care of the patient were reviewed by me and considered in my medical decision making (see chart for details).     Afebrile, non toxic in appearance, without tachycardia or tachypnea. Symptoms for 2 days. Consistent with viral illness. Supportive cares recommended. If symptoms worsen or do not improve in the next week to return to be seen or to follow up with PCP.  Patient verbalized understanding and agreeable to plan.    Final Clinical Impressions(s) / UC Diagnoses   Final diagnoses:  Viral upper respiratory tract infection    ED Discharge Orders        Ordered    sodium chloride (OCEAN) 0.65 % SOLN nasal spray  As needed     12/06/17 1158    guaiFENesin (MUCINEX) 600 MG 12 hr tablet  2 times daily     12/06/17 1158       Controlled Substance Prescriptions [place] Controlled Substance Registry consulted? Not Applicable   Georgetta Haber, NP 12/06/17 1204

## 2017-12-06 NOTE — ED Triage Notes (Signed)
Onset 2 days ago.  Swelling in front of both ears, particularly the left ear.  Nose is very sore as well.  Patient has a headache.

## 2017-12-06 NOTE — Discharge Instructions (Signed)
Push fluids to ensure adequate hydration and keep secretions thin.  Tylenol and/or ibuprofen as needed for pain or fevers.  Continue with daily flonase as you have already been taking. May use nasal saline 4-6 times a day to promote drainage and keep nares moisturized.  Mucinex twice a day may help as an expectorant. If symptoms worsen or do not improve in the next week to return to be seen or to follow up with your PCP.

## 2017-12-08 ENCOUNTER — Emergency Department (HOSPITAL_COMMUNITY)
Admission: EM | Admit: 2017-12-08 | Discharge: 2017-12-08 | Disposition: A | Discharge status: Home / Self Care | Payer: Medicare Other | Attending: Emergency Medicine | Admitting: Emergency Medicine

## 2017-12-08 ENCOUNTER — Other Ambulatory Visit: Payer: Self-pay

## 2017-12-08 ENCOUNTER — Encounter (HOSPITAL_COMMUNITY): Payer: Self-pay | Admitting: Emergency Medicine

## 2017-12-08 DIAGNOSIS — Z79899 Other long term (current) drug therapy: Secondary | ICD-10-CM | POA: Diagnosis not present

## 2017-12-08 DIAGNOSIS — Z96653 Presence of artificial knee joint, bilateral: Secondary | ICD-10-CM | POA: Diagnosis not present

## 2017-12-08 DIAGNOSIS — R51 Headache: Secondary | ICD-10-CM | POA: Diagnosis not present

## 2017-12-08 DIAGNOSIS — E039 Hypothyroidism, unspecified: Secondary | ICD-10-CM | POA: Insufficient documentation

## 2017-12-08 DIAGNOSIS — J340 Abscess, furuncle and carbuncle of nose: Secondary | ICD-10-CM | POA: Insufficient documentation

## 2017-12-08 DIAGNOSIS — R0981 Nasal congestion: Secondary | ICD-10-CM | POA: Diagnosis not present

## 2017-12-08 LAB — CBC WITH DIFFERENTIAL/PLATELET
Basophils Absolute: 0 10*3/uL (ref 0.0–0.1)
Basophils Relative: 0 %
Eosinophils Absolute: 0.1 10*3/uL (ref 0.0–0.7)
Eosinophils Relative: 1 %
HEMATOCRIT: 35.1 % — AB (ref 36.0–46.0)
HEMOGLOBIN: 11 g/dL — AB (ref 12.0–15.0)
LYMPHS PCT: 19 %
Lymphs Abs: 1.4 10*3/uL (ref 0.7–4.0)
MCH: 25.1 pg — ABNORMAL LOW (ref 26.0–34.0)
MCHC: 31.3 g/dL (ref 30.0–36.0)
MCV: 80.1 fL (ref 78.0–100.0)
MONO ABS: 0.8 10*3/uL (ref 0.1–1.0)
MONOS PCT: 11 %
NEUTROS ABS: 5 10*3/uL (ref 1.7–7.7)
Neutrophils Relative %: 69 %
Platelets: 281 10*3/uL (ref 150–400)
RBC: 4.38 MIL/uL (ref 3.87–5.11)
RDW: 14.4 % (ref 11.5–15.5)
WBC: 7.4 10*3/uL (ref 4.0–10.5)

## 2017-12-08 LAB — I-STAT CG4 LACTIC ACID, ED: LACTIC ACID, VENOUS: 0.74 mmol/L (ref 0.5–1.9)

## 2017-12-08 LAB — COMPREHENSIVE METABOLIC PANEL
ALBUMIN: 3.6 g/dL (ref 3.5–5.0)
ALT: 18 U/L (ref 14–54)
ANION GAP: 10 (ref 5–15)
AST: 22 U/L (ref 15–41)
Alkaline Phosphatase: 62 U/L (ref 38–126)
BUN: 8 mg/dL (ref 6–20)
CHLORIDE: 102 mmol/L (ref 101–111)
CO2: 24 mmol/L (ref 22–32)
Calcium: 9.5 mg/dL (ref 8.9–10.3)
Creatinine, Ser: 0.7 mg/dL (ref 0.44–1.00)
GFR calc Af Amer: >60 mL/min (ref >60)
GFR calc non Af Amer: >60 mL/min (ref >60)
Glucose, Bld: 96 mg/dL (ref 65–99)
POTASSIUM: 4.2 mmol/L (ref 3.5–5.1)
SODIUM: 136 mmol/L (ref 135–145)
Total Bilirubin: 0.4 mg/dL (ref 0.3–1.2)
Total Protein: 8.2 g/dL — ABNORMAL HIGH (ref 6.5–8.1)

## 2017-12-08 MED ORDER — MUPIROCIN CALCIUM 2 % NA OINT: TOPICAL_OINTMENT | NASAL | 0 refills | Status: DC

## 2017-12-08 MED ORDER — CLINDAMYCIN HCL 150 MG PO CAPS: 300.0000 mg | ORAL_CAPSULE | Freq: Three times a day (TID) | ORAL | 0 refills | Status: AC

## 2017-12-08 NOTE — ED Triage Notes (Addendum)
Pt c/o nasal soreness, discharge, headache, sinus pain, denies fevers, cough. Resp e/u.

## 2017-12-08 NOTE — ED Notes (Signed)
Pt wheeled to room by this RN.  

## 2017-12-08 NOTE — ED Provider Notes (Signed)
[facility] EMERGENCY DEPARTMENT Provider Note   CSN: [id number] Arrival date & time: 12/08/17  1033     History   Chief Complaint Chief Complaint  Patient presents with  . Facial Pain    HPI DELEAH TISON is a 67 y.o. female.  HPI   Ms. Crittendon is a 67 year old female with a history of schizoaffective disorder, hypothyroidism who presents to the emergency department for evaluation of facial pain.  Patient states that she began having congestion and sinus pain about 4 days ago. Two days ago she noticed that her nose appeared red and was painful when she had to blow her nose.  According to patient, congestion looks thick and green in appearance.  She was seen in urgent care 2 days ago and diagnosed with viral upper respiratory tract infection.  She has not had any antibiotics.  Denies fevers, chills, cough, sore throat, ear pain, shortness of breath, chest pain, abdominal pain, nausea/vomiting.    Past Medical History:  Diagnosis Date  . Anxiety   . Bipolar disorder (HCC)   . Chronic back pain   . History of indigestion   . Hypothyroidism     Patient Active Problem List   Diagnosis Date Noted  . Schizoaffective disorder, bipolar type (HCC) 11/10/2017  . Hoarseness 11/09/2016  . Parotid swelling 03/22/2016  . Nausea and vomiting 11/04/2014  . Hypothyroid 11/04/2014  . Metabolic alkalosis 11/04/2014  . Hypokalemia 11/04/2014  . Knee joint replaced by other means 07/16/2014  . Confusion 07/11/2013  . Depressive disorder, not elsewhere classified 07/11/2013  . Anxiety 07/11/2013  . Vitamin B12 deficiency 06/28/2013  . Gastroesophageal reflux disease without esophagitis 06/28/2013  . Anal pruritus 06/28/2013    Past Surgical History:  Procedure Laterality Date  . carpel tunnel    . ESOPHAGOGASTRODUODENOSCOPY N/A 11/06/2014   Procedure: ESOPHAGOGASTRODUODENOSCOPY (EGD);  Surgeon: Charna Elizabeth, MD;  Location: [facility] ENDOSCOPY;  Service: Endoscopy;   Laterality: N/A;  . JOINT REPLACEMENT     bilat knee  . TONSILLECTOMY    . TOTAL KNEE REVISION  05/01/2012   Procedure: TOTAL KNEE REVISION;  Surgeon: Raymon Mutton, MD;  Location: [facility] OR;  Service: Orthopedics;  Laterality: Right;    OB History    No data available       Home Medications    Prior to Admission medications   Medication Sig Start Date End Date Taking? Authorizing Provider  albuterol (PROAIR HFA) 108 (90 Base) MCG/ACT inhaler Inhale into the lungs.    [provider]  atorvastatin (LIPITOR) 10 MG tablet TAKE 1 TABLET BY MOUTH EVERY DAY 03/27/17   [provider]  benzonatate (TESSALON) 100 MG capsule Take 100 mg by mouth.    [provider]  Cholecalciferol (VITAMIN D3) 2000 units capsule Take by mouth.    [provider]  DULoxetine (CYMBALTA) 60 MG capsule Take 60 mg by mouth at bedtime.  12/10/16   [provider]  guaiFENesin (MUCINEX) 600 MG 12 hr tablet Take 1 tablet (600 mg total) by mouth 2 (two) times daily. 12/06/17   Georgetta Haber, NP  hydrOXYzine (ATARAX/VISTARIL) 25 MG tablet Take 1 tablet (25 mg total) by mouth every 6 (six) hours. 07/08/17   Bethel Born, PA-C  ketotifen (ZADITOR) 0.025 % ophthalmic solution Apply to eye.    [provider]  levothyroxine (SYNTHROID) 112 MCG tablet TAKE 1 TABLET BY MOUTH EVERY DAY 04/06/17   [provider]  lithium carbonate 150 MG capsule  TAKE 1 CAPSULE BY MOUTH 3 TIMES DAILY FOR 30 DAYS. DISCONTINUE 450 MG CAPSULES 06/23/17   [provider]  loratadine (CLARITIN) 10 MG tablet Take 10 mg by mouth daily.    [provider]  LORazepam (ATIVAN) 1 MG tablet Take 1 mg by mouth at bedtime. 07/23/17   [provider]  mometasone (NASONEX) 50 MCG/ACT nasal spray 2 sprays daily. 08/29/17   [provider]  Multiple Vitamin (MULTIVITAMIN) tablet Take 1 tablet by mouth daily.    [provider]  OLANZapine (ZYPREXA) 10 MG  tablet Take 10 mg by mouth.    [provider]  omeprazole (PRILOSEC) 40 MG capsule Take 40 mg by mouth daily as needed for heartburn. 07/25/17   [provider]  oxymetazoline (PX NASAL SPRAY MOISTURIZING) 0.05 % nasal spray Place into the nose. 03/26/16   [provider]  sertraline (ZOLOFT) 50 MG tablet Take 50 mg by mouth.    [provider]  sodium chloride (OCEAN) 0.65 % SOLN nasal spray Place 1 spray into both nostrils as needed for congestion. 12/06/17   Georgetta HaberBurky, Natalie B, NP    Family History Family History  Problem Relation Age of Onset  . Breast cancer Neg Hx     Social History Social History   Tobacco Use  . Smoking status: Former Games developermoker  . Smokeless tobacco: Never Used  Substance Use Topics  . Alcohol use: No  . Drug use: No     Allergies   Patient has no known allergies.   Review of Systems Review of Systems  Constitutional: Negative for chills, fatigue and fever.  HENT: Positive for congestion, rhinorrhea, sinus pressure and sinus pain. Negative for ear pain, sore throat and trouble swallowing.   Respiratory: Negative for shortness of breath.   Cardiovascular: Negative for chest pain.  Gastrointestinal: Negative for abdominal pain, nausea and vomiting.  Skin: Positive for color change (nose is red). Negative for wound.  Neurological: Positive for headaches (frontal).     Physical Exam Updated Vital Signs BP 139/78 (BP Location: Left Arm)   Pulse 88   Temp 99.9 F (37.7 C) (Oral)   Resp 18   SpO2 100%   Physical Exam  Constitutional: She appears well-developed and well-nourished. No distress.  HENT:  Head: Normocephalic and atraumatic.  Right Ear: Hearing, tympanic membrane and ear canal normal.  Left Ear: Hearing, tympanic membrane and ear canal normal.  Mouth/Throat: Oropharynx is clear and moist. No oropharyngeal exudate.  Cartilage over the nasal septum appears erythematous and edematous. Tip of nose erythematous  and tender to the touch. Thick green nasal discharge noted in bilateral nares. Tenderness over bilateral maxillary and frontal sinus.   Eyes: Conjunctivae are normal. Pupils are equal, round, and reactive to light. Right eye exhibits no discharge. Left eye exhibits no discharge.  Neck: Normal range of motion. Neck supple.  Pulmonary/Chest: Effort normal. No respiratory distress.  Lymphadenopathy:    She has no cervical adenopathy.  Neurological: She is alert. Coordination normal.  Skin: She is not diaphoretic.  Psychiatric: She has a normal mood and affect. Her behavior is normal.  Nursing note and vitals reviewed.    ED Treatments / Results  Labs (all labs ordered are listed, but only abnormal results are displayed) Labs Reviewed  COMPREHENSIVE METABOLIC PANEL - Abnormal; Notable for the following components:      Result Value   Total Protein 8.2 (*)    All other components within normal limits  CBC WITH  DIFFERENTIAL/PLATELET - Abnormal; Notable for the following components:   Hemoglobin 11.0 (*)    HCT 35.1 (*)    MCH 25.1 (*)    All other components within normal limits  I-STAT CG4 LACTIC ACID, ED  I-STAT CG4 LACTIC ACID, ED    EKG  EKG Interpretation None       Radiology No results found.  Procedures Procedures (including critical care time)  Medications Ordered in ED Medications - No data to display   Initial Impression / Assessment and Plan / ED Course  I have reviewed the triage vital signs and the nursing notes.  Pertinent labs & imaging results that were available during my care of the patient were reviewed by me and considered in my medical decision making (see chart for details).    Presents with congestion, sinus pain and erythema over the nose and cartilage of the nasal septum. Labs ordered prior to me seeing the patient. Lactic acid negative. CBC and CMP unremarkable. Will treat for developing cellulitis with Clindamycin and topical Bactroban.  Discussed return precautions and patient agrees. Discussed this patient with Dr. Dalene Seltzer also saw the pt and agrees with above plan.   Final Clinical Impressions(s) / ED Diagnoses   Final diagnoses:  Cellulitis of nasal tip    ED Discharge Orders        Ordered    clindamycin (CLEOCIN) 150 MG capsule  3 times daily     12/08/17 1258    mupirocin nasal ointment (BACTROBAN) 2 %     12/08/17 1258       Kellie Shropshire, PA-C 12/10/17 1007    Alvira Monday, MD 12/11/17 [phone]

## 2017-12-08 NOTE — Discharge Instructions (Signed)
Please take 300 mg of antibiotic 3 times a day.  Do this for 7 days.  Also placed antibiotic ointment over the area of redness on the nose once a day.  Schedule an appointment with your primary doctor if your symptoms are not improving in the next 3 days.  Return to the emergency department if you develop fever greater than 100.4 F, have worsening redness and swelling in the nose despite taking the antibiotic or have any new or worsening symptoms.

## 2017-12-12 ENCOUNTER — Encounter: Payer: Medicare Other | Admitting: Vascular Surgery

## 2017-12-21 DIAGNOSIS — E559 Vitamin D deficiency, unspecified: Secondary | ICD-10-CM | POA: Diagnosis not present

## 2017-12-21 DIAGNOSIS — Z79899 Other long term (current) drug therapy: Secondary | ICD-10-CM | POA: Diagnosis not present

## 2018-01-17 NOTE — Progress Notes (Signed)
  Subjective:  Patient ID: Brenda Murray, female    DOB: Apr 01, 1951,  MRN: [id number]  Chief Complaint  Patient presents with  . Foot Pain    3 WK FOLLOW UP CALLUS   67 y.o. female returns for the above complaint.  States that she is feeling better.  States that she is wearing better shoes.  Complains of her heels are dry and cracked.  Objective:  There were no vitals filed for this visit. General AA&O x3. Normal mood and affect.  Vascular Pedal pulses palpable.  Neurologic Epicritic sensation grossly intact.  Dermatologic No open lesions. Skin normal texture and turgor. Xerosis bilat foot.  Orthopedic: No pain to palpation either foot.   Assessment & Plan:  Patient was evaluated and treated and all questions answered.  Neuroma bilateral -Improved. -Continue padding and shoe gear changes.  Xerosis -Recommended lotion to patient  Return if symptoms worsen or fail to improve.

## 2018-01-30 ENCOUNTER — Ambulatory Visit
Admission: RE | Admit: 2018-01-30 | Discharge: 2018-01-30 | Disposition: A | Discharge status: Home / Self Care | Payer: Medicare Other | Source: Ambulatory Visit | Attending: Internal Medicine | Admitting: Internal Medicine

## 2018-01-30 DIAGNOSIS — Z1231 Encounter for screening mammogram for malignant neoplasm of breast: Secondary | ICD-10-CM

## 2018-03-23 ENCOUNTER — Encounter: Payer: Self-pay | Admitting: *Deleted

## 2018-04-13 DIAGNOSIS — R4189 Other symptoms and signs involving cognitive functions and awareness: Secondary | ICD-10-CM | POA: Insufficient documentation

## 2018-04-14 DIAGNOSIS — E785 Hyperlipidemia, unspecified: Secondary | ICD-10-CM | POA: Insufficient documentation

## 2018-04-14 DIAGNOSIS — J302 Other seasonal allergic rhinitis: Secondary | ICD-10-CM | POA: Insufficient documentation

## 2018-04-14 DIAGNOSIS — E559 Vitamin D deficiency, unspecified: Secondary | ICD-10-CM | POA: Insufficient documentation

## 2018-05-01 ENCOUNTER — Encounter: Payer: Self-pay | Admitting: Obstetrics and Gynecology

## 2018-05-02 ENCOUNTER — Encounter: Payer: Self-pay | Admitting: Medical

## 2018-05-31 ENCOUNTER — Ambulatory Visit
Admission: RE | Admit: 2018-05-31 | Discharge: 2018-05-31 | Disposition: A | Discharge status: Home / Self Care | Payer: Medicaid Other | Source: Ambulatory Visit | Attending: Gerontology | Admitting: Gerontology

## 2018-05-31 ENCOUNTER — Other Ambulatory Visit: Payer: Self-pay | Admitting: Gerontology

## 2018-05-31 DIAGNOSIS — R52 Pain, unspecified: Secondary | ICD-10-CM

## 2018-05-31 DIAGNOSIS — R079 Chest pain, unspecified: Secondary | ICD-10-CM

## 2018-06-02 ENCOUNTER — Ambulatory Visit: Payer: Medicare (Managed Care) | Admitting: Podiatry

## 2018-06-05 ENCOUNTER — Telehealth: Payer: Self-pay | Admitting: Internal Medicine

## 2018-06-05 NOTE — Telephone Encounter (Signed)
Received referral to schedule colonoscopy. Dr. Rhea BeltonPyrtle is Doc of the Day for 06/01/18 PM. Records placed on his desk for review.

## 2018-06-07 ENCOUNTER — Ambulatory Visit: Payer: Medicare (Managed Care) | Admitting: Podiatry

## 2018-06-12 NOTE — Telephone Encounter (Signed)
Dr. Rhea BeltonPyrtle reviewed records and "repeat colon 10 years (03/2023), unless family hx cancer (which I am unaware of).  Recall placed in Epic and patient notified

## 2018-06-15 ENCOUNTER — Ambulatory Visit (INDEPENDENT_AMBULATORY_CARE_PROVIDER_SITE_OTHER): Payer: Medicare (Managed Care) | Admitting: Podiatry

## 2018-06-15 DIAGNOSIS — M779 Enthesopathy, unspecified: Secondary | ICD-10-CM

## 2018-06-15 DIAGNOSIS — M21961 Unspecified acquired deformity of right lower leg: Secondary | ICD-10-CM

## 2018-06-15 DIAGNOSIS — M21962 Unspecified acquired deformity of left lower leg: Secondary | ICD-10-CM

## 2018-06-20 NOTE — Progress Notes (Signed)
  Subjective:  Patient ID: Brenda Murray, female    DOB: [birth year],  MRN: [id number]  Chief Complaint  Patient presents with  . Callouses    right heel spur/callus   67 y.o. female returns for the above complaint.  Reports painful calluses to both feet  Objective:  There were no vitals filed for this visit. General AA&O x3. Normal mood and affect.  Vascular Pedal pulses palpable.  Neurologic Epicritic sensation grossly intact.  Dermatologic  H PK sub-hallux bilateral foot met bilateral  Orthopedic: No pain to palpation either foot.   Assessment & Plan:  Patient was evaluated and treated and all questions answered.  Porokeratoses -Discussed she does not meet criteria for routine foot care. -Lesions debrided courtesy today.  Return if symptoms worsen or fail to improve.

## 2018-07-19 ENCOUNTER — Other Ambulatory Visit: Payer: Self-pay | Admitting: Gerontology

## 2018-07-19 ENCOUNTER — Ambulatory Visit
Admission: RE | Admit: 2018-07-19 | Discharge: 2018-07-19 | Disposition: A | Discharge status: Home / Self Care | Payer: No Typology Code available for payment source | Source: Ambulatory Visit | Attending: Gerontology | Admitting: Gerontology

## 2018-07-19 DIAGNOSIS — R609 Edema, unspecified: Secondary | ICD-10-CM

## 2018-07-19 DIAGNOSIS — R52 Pain, unspecified: Secondary | ICD-10-CM

## 2018-10-01 ENCOUNTER — Emergency Department (HOSPITAL_COMMUNITY)
Admission: EM | Admit: 2018-10-01 | Discharge: 2018-10-02 | Disposition: A | Discharge status: Home / Self Care | Payer: Medicare (Managed Care) | Attending: Emergency Medicine | Admitting: Emergency Medicine

## 2018-10-01 ENCOUNTER — Encounter (HOSPITAL_COMMUNITY): Payer: Self-pay | Admitting: Oncology

## 2018-10-01 DIAGNOSIS — B9789 Other viral agents as the cause of diseases classified elsewhere: Secondary | ICD-10-CM | POA: Insufficient documentation

## 2018-10-01 DIAGNOSIS — R05 Cough: Secondary | ICD-10-CM | POA: Diagnosis present

## 2018-10-01 DIAGNOSIS — E039 Hypothyroidism, unspecified: Secondary | ICD-10-CM | POA: Insufficient documentation

## 2018-10-01 DIAGNOSIS — J069 Acute upper respiratory infection, unspecified: Secondary | ICD-10-CM | POA: Diagnosis not present

## 2018-10-01 DIAGNOSIS — J209 Acute bronchitis, unspecified: Secondary | ICD-10-CM | POA: Insufficient documentation

## 2018-10-01 DIAGNOSIS — Z79899 Other long term (current) drug therapy: Secondary | ICD-10-CM | POA: Insufficient documentation

## 2018-10-01 LAB — I-STAT TROPONIN, ED: TROPONIN I, POC: 0 ng/mL (ref 0.00–0.08)

## 2018-10-01 MED ORDER — METHYLPREDNISOLONE SODIUM SUCC 125 MG IJ SOLR
125.0000 mg | Freq: Once | INTRAMUSCULAR | Status: AC
  Administered 2018-10-02: 125 mg via INTRAVENOUS
  Filled 2018-10-01: qty 2

## 2018-10-01 MED ORDER — IPRATROPIUM-ALBUTEROL 0.5-2.5 (3) MG/3ML IN SOLN
3.0000 mL | Freq: Once | RESPIRATORY_TRACT | Status: AC
  Administered 2018-10-02: 3 mL via RESPIRATORY_TRACT
  Filled 2018-10-01: qty 3

## 2018-10-01 NOTE — ED Provider Notes (Signed)
[facility] EMERGENCY DEPARTMENT Provider Note   CSN: [id number] Arrival date & time: 10/01/18  2318     History   Chief Complaint Chief Complaint  Patient presents with  . Cough    HPI Brenda Murray is a 67 y.o. female.  67 year old female with past medical history including bipolar disorder, anxiety, hypothyroidism, chronic back pain who presents with cough.  She reports that she has had productive cough for the past 2 days associated with nasal congestion, runny nose, and sneezing.  She has had some chest tightness associated with the coughing, some shortness of breath, and some thoracic back pain that is worse with certain movements and worse with coughing.  She has taken her home medications without relief.  She states that a very similar episode happened 1 month ago, cough eventually went away but now it is back.  She denies any sick contacts, fevers, vomiting, diarrhea, or urinary symptoms.  The history is provided by the patient.  Cough     Past Medical History:  Diagnosis Date  . Anxiety   . Bipolar disorder (HCC)   . Chronic back pain   . History of indigestion   . Hypothyroidism     Patient Active Problem List   Diagnosis Date Noted  . Schizoaffective disorder, bipolar type (HCC) 11/10/2017  . Hoarseness 11/09/2016  . Parotid swelling 03/22/2016  . Nausea and vomiting 11/04/2014  . Hypothyroid 11/04/2014  . Metabolic alkalosis 11/04/2014  . Hypokalemia 11/04/2014  . Knee joint replaced by other means 07/16/2014  . Confusion 07/11/2013  . Depressive disorder, not elsewhere classified 07/11/2013  . Anxiety 07/11/2013  . Vitamin B12 deficiency 06/28/2013  . Gastroesophageal reflux disease without esophagitis 06/28/2013  . Anal pruritus 06/28/2013    Past Surgical History:  Procedure Laterality Date  . carpel tunnel    . ESOPHAGOGASTRODUODENOSCOPY N/A 11/06/2014   Procedure: ESOPHAGOGASTRODUODENOSCOPY (EGD);  Surgeon: Charna Elizabeth,  MD;  Location[facility] ENDOSCOPY;  Service: Endoscopy;  Laterality: N/A;  . JOINT REPLACEMENT     bilat knee  . TONSILLECTOMY    . TOTAL KNEE REVISION  05/01/2012   Procedure: TOTAL KNEE REVISION;  Surgeon: Raymon Mutton, MD;  Location: [facility] OR;  Service: Orthopedics;  Laterality: Right;     OB History   None      Home Medications    Prior to Admission medications   Medication Sig Start Date End Date Taking? Authorizing Provider  albuterol (PROAIR HFA) 108 (90 Base) MCG/ACT inhaler Inhale into the lungs.    [provider]  atorvastatin (LIPITOR) 10 MG tablet TAKE 1 TABLET BY MOUTH EVERY DAY 03/27/17   [provider]  benzonatate (TESSALON) 100 MG capsule Take 100 mg by mouth.    [provider]  Cholecalciferol (VITAMIN D3) 2000 units capsule Take by mouth.    [provider]  DULoxetine (CYMBALTA) 60 MG capsule Take 60 mg by mouth at bedtime.  12/10/16   [provider]  guaiFENesin (MUCINEX) 600 MG 12 hr tablet Take 1 tablet (600 mg total) by mouth 2 (two) times daily for 4 days. 10/02/18 10/06/18  , Ambrose Finland, MD  hydrOXYzine (ATARAX/VISTARIL) 25 MG tablet Take 1 tablet (25 mg total) by mouth every 6 (six) hours. 07/08/17   Bethel Born, PA-C  ketotifen (ZADITOR) 0.025 % ophthalmic solution Apply to eye.    [provider]  levothyroxine (SYNTHROID) 112 MCG tablet TAKE 1 TABLET BY MOUTH EVERY DAY 04/06/17   [provider]  lithium carbonate 150 MG capsule TAKE 1 CAPSULE BY MOUTH 3 TIMES DAILY FOR 30 DAYS. DISCONTINUE 450 MG CAPSULES 06/23/17   [provider]  loratadine (CLARITIN) 10 MG tablet Take 10 mg by mouth daily.    [provider]  LORazepam (ATIVAN) 1 MG tablet Take 1 mg by mouth at bedtime. 07/23/17   [provider]  mometasone (NASONEX) 50 MCG/ACT nasal spray 2 sprays daily. 08/29/17   [provider]  Multiple Vitamin (MULTIVITAMIN) tablet Take 1 tablet by mouth daily.     [provider]  mupirocin nasal ointment (BACTROBAN) 2 % Apply in each nostril daily 12/08/17   Mady Gemma, Danella Penton, PA-C  OLANZapine (ZYPREXA) 10 MG tablet Take 10 mg by mouth.    [provider]  omeprazole (PRILOSEC) 40 MG capsule Take 40 mg by mouth daily as needed for heartburn. 07/25/17   [provider]  oxymetazoline (PX NASAL SPRAY MOISTURIZING) 0.05 % nasal spray Place into the nose. 03/26/16   [provider]  predniSONE (DELTASONE) 20 MG tablet Take 2 tablets (40 mg total) by mouth daily. 10/02/18   , Ambrose Finland, MD  sertraline (ZOLOFT) 50 MG tablet Take 50 mg by mouth.    [provider]  sodium chloride (OCEAN) 0.65 % SOLN nasal spray Place 1 spray into both nostrils as needed for congestion. 12/06/17   Georgetta Haber, NP    Family History Family History  Problem Relation Age of Onset  . Breast cancer Neg Hx     Social History Social History   Tobacco Use  . Smoking status: Former Games developer  . Smokeless tobacco: Never Used  Substance Use Topics  . Alcohol use: No  . Drug use: No     Allergies   Patient has no known allergies.   Review of Systems Review of Systems  Respiratory: Positive for cough.    All other systems reviewed and are negative except that which was mentioned in HPI   Physical Exam Updated Vital Signs BP 129/74 (BP Location: Right Arm)   Pulse 71   Temp 98.6 F (37 C) (Oral)   Resp 20   Ht 5' (1.524 m)   Wt 86.2 kg   SpO2 98%   BMI 37.11 kg/m   Physical Exam  Constitutional: She is oriented to person, place, and time. She appears well-developed and well-nourished. No distress.  HENT:  Head: Normocephalic and atraumatic.  Moist mucous membranes  Eyes: Pupils are equal, round, and reactive to light. Conjunctivae are normal.  Neck: Neck supple.  Cardiovascular: Normal rate, regular rhythm and normal heart sounds.  No murmur heard. Pulmonary/Chest:  Mild tachypnea without  respiratory distress, speaking in full sentences; expiratory wheezes in all lung fields  Abdominal: Soft. Bowel sounds are normal. She exhibits no distension. There is no tenderness.  Musculoskeletal: She exhibits no edema.  Neurological: She is alert and oriented to person, place, and time.  Fluent speech  Skin: Skin is warm and dry.  Psychiatric: She has a normal mood and affect. Judgment normal.  Nursing note and vitals reviewed.    ED Treatments / Results  Labs (all labs ordered are listed, but only abnormal results are displayed) Labs Reviewed  COMPREHENSIVE METABOLIC PANEL - Abnormal; Notable for the following components:      Result Value   Glucose, Bld 101 (*)    All other components within normal limits  CBC WITH DIFFERENTIAL/PLATELET - Abnormal; Notable for the following components:   Hemoglobin  10.9 (*)    HCT 35.2 (*)    MCH 25.3 (*)    RDW 16.4 (*)    Monocytes Absolute 1.1 (*)    Abs Immature Granulocytes 0.27 (*)    All other components within normal limits  I-STAT TROPONIN, ED    EKG EKG Interpretation  Date/Time:  Monday October 02 2018 00:35:40 EST Ventricular Rate:  90 PR Interval:    QRS Duration: 80 QT Interval:  345 QTC Calculation: 423 R Axis:   65 Text Interpretation:  Sinus rhythm rate slower otherwise similar to previous Confirmed by Frederick Peers [phone]) on 10/02/2018 12:46:24 AM   Radiology Dg Chest 2 View  Result Date: 10/02/2018 CLINICAL DATA:  Acute onset of cough, wheezing and shortness of breath. EXAM: CHEST - 2 VIEW COMPARISON:  Chest radiograph performed 05/31/2018 FINDINGS: The lungs are well-aerated. Pulmonary vascularity is at the upper limits of normal. There is no evidence of focal opacification, pleural effusion or pneumothorax. The heart is normal in size; the mediastinal contour is within normal limits. No acute osseous abnormalities are seen. IMPRESSION: No acute cardiopulmonary process seen. Electronically Signed   By:  Roanna Raider M.D.   On: 10/02/2018 00:14    Procedures Procedures (including critical care time)  Medications Ordered in ED Medications  albuterol (PROVENTIL HFA;VENTOLIN HFA) 108 (90 Base) MCG/ACT inhaler 2 puff (has no administration in time range)  AEROCHAMBER PLUS FLO-VU LARGE MISC 1 each (has no administration in time range)  ipratropium-albuterol (DUONEB) 0.5-2.5 (3) MG/3ML nebulizer solution 3 mL (3 mLs Nebulization Given 10/02/18 0015)  methylPREDNISolone sodium succinate (SOLU-MEDROL) 125 mg/2 mL injection 125 mg (125 mg Intravenous Given 10/02/18 0015)  ipratropium-albuterol (DUONEB) 0.5-2.5 (3) MG/3ML nebulizer solution 3 mL (3 mLs Nebulization Given 10/02/18 0131)     Initial Impression / Assessment and Plan / ED Course  I have reviewed the triage vital signs and the nursing notes.  Pertinent labs & imaging results that were available during my care of the patient were reviewed by me and considered in my medical decision making (see chart for details).    No respiratory distress on exam, VS reassuring, wheezing noted. Previous smoking hx. Gave duoneb, solumedrol. Obtained above labs and CXR. Her description of thoracic back pain is positional and related to coughing. Chest tightness also related to coughing. Very low suspicion for ACS, dissection, or other life-threatening cardiac process.    CXR clear. Labwork reassuring including normal WBC count. Pt comfortable on reassessment. Recommended continuing steroids and prednisone. She has appt at [facility] in the morning.  Reviewed return precautions with the patient and her family.  They voiced understanding.   Final Clinical Impressions(s) / ED Diagnoses   Final diagnoses:  Viral URI with cough  Acute bronchitis, unspecified organism    ED Discharge Orders         Ordered    predniSONE (DELTASONE) 20 MG tablet  Daily     10/02/18 0206    guaiFENesin (MUCINEX) 600 MG 12 hr tablet  2 times daily     10/02/18 0206             , Ambrose Finland, MD 10/02/18 0210

## 2018-10-01 NOTE — ED Triage Notes (Signed)
Pt bib [facility] from home d/t cough, stuffy nose and pain in her left axilla and back.  Pt has taken medication w/o relief.

## 2018-10-02 ENCOUNTER — Emergency Department (HOSPITAL_COMMUNITY): Payer: Medicare (Managed Care)

## 2018-10-02 LAB — CBC WITH DIFFERENTIAL/PLATELET
Abs Immature Granulocytes: 0.27 10*3/uL — ABNORMAL HIGH (ref 0.00–0.07)
Basophils Absolute: 0 10*3/uL (ref 0.0–0.1)
Basophils Relative: 0 %
EOS ABS: 0.1 10*3/uL (ref 0.0–0.5)
EOS PCT: 1 %
HEMATOCRIT: 35.2 % — AB (ref 36.0–46.0)
Hemoglobin: 10.9 g/dL — ABNORMAL LOW (ref 12.0–15.0)
IMMATURE GRANULOCYTES: 3 %
LYMPHS ABS: 1.8 10*3/uL (ref 0.7–4.0)
Lymphocytes Relative: 22 %
MCH: 25.3 pg — ABNORMAL LOW (ref 26.0–34.0)
MCHC: 31 g/dL (ref 30.0–36.0)
MCV: 81.7 fL (ref 80.0–100.0)
MONOS PCT: 13 %
Monocytes Absolute: 1.1 10*3/uL — ABNORMAL HIGH (ref 0.1–1.0)
NEUTROS PCT: 61 %
Neutro Abs: 4.9 10*3/uL (ref 1.7–7.7)
Platelets: 271 10*3/uL (ref 150–400)
RBC: 4.31 MIL/uL (ref 3.87–5.11)
RDW: 16.4 % — AB (ref 11.5–15.5)
WBC: 8.1 10*3/uL (ref 4.0–10.5)
nRBC: 0 % (ref 0.0–0.2)

## 2018-10-02 LAB — COMPREHENSIVE METABOLIC PANEL
ALBUMIN: 3.6 g/dL (ref 3.5–5.0)
ALK PHOS: 59 U/L (ref 38–126)
ALT: 28 U/L (ref 0–44)
AST: 25 U/L (ref 15–41)
Anion gap: 9 (ref 5–15)
BUN: 19 mg/dL (ref 8–23)
CALCIUM: 10 mg/dL (ref 8.9–10.3)
CO2: 27 mmol/L (ref 22–32)
CREATININE: 0.7 mg/dL (ref 0.44–1.00)
Chloride: 101 mmol/L (ref 98–111)
GFR calc Af Amer: >60 mL/min (ref >60)
GFR calc non Af Amer: >60 mL/min (ref >60)
GLUCOSE: 101 mg/dL — AB (ref 70–99)
Potassium: 3.9 mmol/L (ref 3.5–5.1)
SODIUM: 137 mmol/L (ref 135–145)
Total Bilirubin: 0.5 mg/dL (ref 0.3–1.2)
Total Protein: 7.8 g/dL (ref 6.5–8.1)

## 2018-10-02 MED ORDER — AEROCHAMBER PLUS FLO-VU LARGE MISC
Status: AC
  Filled 2018-10-02: qty 1

## 2018-10-02 MED ORDER — IPRATROPIUM-ALBUTEROL 0.5-2.5 (3) MG/3ML IN SOLN
3.0000 mL | Freq: Once | RESPIRATORY_TRACT | Status: AC
  Administered 2018-10-02: 3 mL via RESPIRATORY_TRACT
  Filled 2018-10-02: qty 3

## 2018-10-02 MED ORDER — GUAIFENESIN ER 600 MG PO TB12: 600.0000 mg | ORAL_TABLET | Freq: Two times a day (BID) | ORAL | 0 refills | Status: AC

## 2018-10-02 MED ORDER — ALBUTEROL SULFATE HFA 108 (90 BASE) MCG/ACT IN AERS
2.0000 | INHALATION_SPRAY | RESPIRATORY_TRACT | Status: DC | PRN
  Administered 2018-10-02: 2 via RESPIRATORY_TRACT
  Filled 2018-10-02: qty 6.7

## 2018-10-02 MED ORDER — PREDNISONE 20 MG PO TABS: 40.0000 mg | ORAL_TABLET | Freq: Every day | ORAL | 0 refills | Status: DC

## 2018-10-02 MED ORDER — AEROCHAMBER PLUS FLO-VU LARGE MISC
1.0000 | Freq: Once | Status: AC
  Administered 2018-10-02: 1

## 2018-10-02 NOTE — Discharge Instructions (Addendum)
On Monday, start taking steroid (prednisone) 40 mg once daily until finished. Take mucinex for phelgm.  Use albuterol inhaler 2 puffs every 4 to 6 hours to help with breathing. See your primary care provider in the next 1-2 days for recheck. Return to ER if you have high fevers, breathing problems, or severe chest pain.

## 2018-10-15 ENCOUNTER — Emergency Department (HOSPITAL_COMMUNITY): Payer: Medicare (Managed Care)

## 2018-10-15 ENCOUNTER — Encounter (HOSPITAL_COMMUNITY): Payer: Self-pay | Admitting: *Deleted

## 2018-10-15 ENCOUNTER — Emergency Department (HOSPITAL_COMMUNITY)
Admission: EM | Admit: 2018-10-15 | Discharge: 2018-10-16 | Disposition: A | Discharge status: Other Acute Inpatient Hospital | Payer: Medicare (Managed Care) | Attending: Emergency Medicine | Admitting: Emergency Medicine

## 2018-10-15 DIAGNOSIS — E039 Hypothyroidism, unspecified: Secondary | ICD-10-CM | POA: Insufficient documentation

## 2018-10-15 DIAGNOSIS — Z96653 Presence of artificial knee joint, bilateral: Secondary | ICD-10-CM | POA: Insufficient documentation

## 2018-10-15 DIAGNOSIS — Z79899 Other long term (current) drug therapy: Secondary | ICD-10-CM | POA: Diagnosis not present

## 2018-10-15 DIAGNOSIS — F419 Anxiety disorder, unspecified: Secondary | ICD-10-CM | POA: Insufficient documentation

## 2018-10-15 DIAGNOSIS — F259 Schizoaffective disorder, unspecified: Secondary | ICD-10-CM | POA: Insufficient documentation

## 2018-10-15 DIAGNOSIS — R4182 Altered mental status, unspecified: Secondary | ICD-10-CM | POA: Diagnosis present

## 2018-10-15 DIAGNOSIS — F3110 Bipolar disorder, current episode manic without psychotic features, unspecified: Secondary | ICD-10-CM | POA: Diagnosis not present

## 2018-10-15 DIAGNOSIS — Z87891 Personal history of nicotine dependence: Secondary | ICD-10-CM | POA: Insufficient documentation

## 2018-10-15 DIAGNOSIS — F250 Schizoaffective disorder, bipolar type: Secondary | ICD-10-CM

## 2018-10-15 LAB — CBC WITH DIFFERENTIAL/PLATELET
Abs Immature Granulocytes: 0.05 10*3/uL (ref 0.00–0.07)
BASOS PCT: 0 %
Basophils Absolute: 0 10*3/uL (ref 0.0–0.1)
EOS ABS: 0 10*3/uL (ref 0.0–0.5)
EOS PCT: 0 %
HCT: 40.9 % (ref 36.0–46.0)
Hemoglobin: 12.5 g/dL (ref 12.0–15.0)
Immature Granulocytes: 1 %
Lymphocytes Relative: 12 %
Lymphs Abs: 1.2 10*3/uL (ref 0.7–4.0)
MCH: 25.4 pg — AB (ref 26.0–34.0)
MCHC: 30.6 g/dL (ref 30.0–36.0)
MCV: 83 fL (ref 80.0–100.0)
MONO ABS: 0.9 10*3/uL (ref 0.1–1.0)
Monocytes Relative: 9 %
NEUTROS ABS: 7.7 10*3/uL (ref 1.7–7.7)
Neutrophils Relative %: 78 %
PLATELETS: 271 10*3/uL (ref 150–400)
RBC: 4.93 MIL/uL (ref 3.87–5.11)
RDW: 15.7 % — AB (ref 11.5–15.5)
WBC: 9.9 10*3/uL (ref 4.0–10.5)
nRBC: 0 % (ref 0.0–0.2)

## 2018-10-15 LAB — COMPREHENSIVE METABOLIC PANEL
ALBUMIN: 4.3 g/dL (ref 3.5–5.0)
ALT: 23 U/L (ref 0–44)
AST: 29 U/L (ref 15–41)
Alkaline Phosphatase: 60 U/L (ref 38–126)
Anion gap: 11 (ref 5–15)
BILIRUBIN TOTAL: 0.5 mg/dL (ref 0.3–1.2)
BUN: 17 mg/dL (ref 8–23)
CO2: 27 mmol/L (ref 22–32)
Calcium: 10.2 mg/dL (ref 8.9–10.3)
Chloride: 100 mmol/L (ref 98–111)
Creatinine, Ser: 1.03 mg/dL — ABNORMAL HIGH (ref 0.44–1.00)
GFR calc Af Amer: >60 mL/min (ref >60)
GFR calc non Af Amer: 55 mL/min — ABNORMAL LOW (ref >60)
GLUCOSE: 95 mg/dL (ref 70–99)
Potassium: 4.7 mmol/L (ref 3.5–5.1)
Sodium: 138 mmol/L (ref 135–145)
TOTAL PROTEIN: 8.9 g/dL — AB (ref 6.5–8.1)

## 2018-10-15 LAB — URINALYSIS, ROUTINE W REFLEX MICROSCOPIC
Bilirubin Urine: NEGATIVE
GLUCOSE, UA: NEGATIVE mg/dL
Hgb urine dipstick: NEGATIVE
Ketones, ur: 20 mg/dL — AB
LEUKOCYTES UA: NEGATIVE
Nitrite: NEGATIVE
PROTEIN: NEGATIVE mg/dL
SPECIFIC GRAVITY, URINE: 1.017 (ref 1.005–1.030)
pH: 6 (ref 5.0–8.0)

## 2018-10-15 LAB — RAPID URINE DRUG SCREEN, HOSP PERFORMED
AMPHETAMINES: NOT DETECTED
BARBITURATES: NOT DETECTED
Benzodiazepines: NOT DETECTED
Cocaine: NOT DETECTED
Opiates: NOT DETECTED
TETRAHYDROCANNABINOL: NOT DETECTED

## 2018-10-15 LAB — LITHIUM LEVEL

## 2018-10-15 LAB — ACETAMINOPHEN LEVEL

## 2018-10-15 LAB — ETHANOL: Alcohol, Ethyl (B): <10 mg/dL (ref <10)

## 2018-10-15 LAB — SALICYLATE LEVEL

## 2018-10-15 LAB — TSH: TSH: 1.482 u[IU]/mL (ref 0.350–4.500)

## 2018-10-15 MED ORDER — LEVOTHYROXINE SODIUM 100 MCG PO TABS
100.0000 ug | ORAL_TABLET | Freq: Every day | ORAL | Status: DC
  Filled 2018-10-15: qty 1

## 2018-10-15 MED ORDER — MUPIROCIN CALCIUM 2 % NA OINT: TOPICAL_OINTMENT | Freq: Two times a day (BID) | NASAL | Status: DC

## 2018-10-15 MED ORDER — ALUM & MAG HYDROXIDE-SIMETH 200-200-20 MG/5ML PO SUSP: 30.0000 mL | Freq: Four times a day (QID) | ORAL | Status: DC | PRN

## 2018-10-15 MED ORDER — ACETAMINOPHEN 325 MG PO TABS: 650.0000 mg | ORAL_TABLET | ORAL | Status: DC | PRN

## 2018-10-15 MED ORDER — FLUTICASONE PROPIONATE 50 MCG/ACT NA SUSP
1.0000 | Freq: Every day | NASAL | Status: DC
  Filled 2018-10-15: qty 16

## 2018-10-15 MED ORDER — PANTOPRAZOLE SODIUM 40 MG PO TBEC
40.0000 mg | DELAYED_RELEASE_TABLET | Freq: Every day | ORAL | Status: DC
  Filled 2018-10-15: qty 1

## 2018-10-15 MED ORDER — ATORVASTATIN CALCIUM 10 MG PO TABS: 10.0000 mg | ORAL_TABLET | Freq: Every day | ORAL | Status: DC

## 2018-10-15 MED ORDER — LORAZEPAM 2 MG/ML IJ SOLN
1.0000 mg | Freq: Once | INTRAMUSCULAR | Status: AC
  Administered 2018-10-15: 1 mg via INTRAMUSCULAR
  Filled 2018-10-15: qty 1

## 2018-10-15 NOTE — ED Notes (Signed)
Bed: [id number] Expected date:  Expected time:  Means of arrival:  Comments: RM 16

## 2018-10-15 NOTE — ED Notes (Signed)
[facility] date:  Expected time:  Means of arrival:  Comments: Med clearance

## 2018-10-15 NOTE — ED Notes (Signed)
Nurse on call from [facility] called [facility]. She has a history of Bipolar and schizophrenia. Questions can be directed to [phone]

## 2018-10-15 NOTE — ED Provider Notes (Signed)
[facility]-EMERGENCY DEPT Provider Note   CSN: [id number] Arrival date & time: 10/15/18  1620     History   Chief Complaint Chief Complaint  Patient presents with  . Manic Behavior    HPI Brenda Murray is a 67 y.o. female with a history of schizoaffective disorder, bipolar type, hypothyroidism, anxiety, and DJD who presents to the emergency department by EMS from independent living at [facility] after neighbors called police.  Per [place] at [place], neighbors noted the patient was pacing the floors and crying so they called the police.  She reports at baseline, the patient lives in the independent living section of housing and performs all of her ADLs.  She states the patient's son checks on her regularly and administers her medication.  She reports the patient had a similar episode in July or August and was sent to [facility] and was admitted to behavioral health.  She was started on in [place] during that admission and has been compliant with her monthly Invega injections.  Last injection was 11/4.  The patient has been doing well since her last admission until the last 1 to 2 weeks when the patient was seen in the ED on 10/01/16 with URI symptoms and was started on a course of prednisone. Apparently the patient was already taking another course of prednisone for DJD and ended up taking both prescriptions.  Family and Rene Kocher note that the patient's symptoms and behavior changes started around this time when she began having mood swings, crying, became withdrawn, and was not sleeping.  Was started on melatonin, but Rene Kocher was unsure if she was taking it.  The patient's physician, Dr. Mayford Knife, has reduced her prednisone to 5 mg daily and is attempting to taper her off the medication in the next few days.  When I examined the patient, she was able to tell me her first and last name and otherwise was only able to say "help".   Level V caveat  secondary to AMS.   The history is provided by the nursing home. No language interpreter was used.    Past Medical History:  Diagnosis Date  . Anxiety   . Bipolar disorder (HCC)   . Chronic back pain   . History of indigestion   . Hypothyroidism     Patient Active Problem List   Diagnosis Date Noted  . Schizoaffective disorder, bipolar type (HCC) 11/10/2017  . Hoarseness 11/09/2016  . Parotid swelling 03/22/2016  . Nausea and vomiting 11/04/2014  . Hypothyroid 11/04/2014  . Metabolic alkalosis 11/04/2014  . Hypokalemia 11/04/2014  . Knee joint replaced by other means 07/16/2014  . Confusion 07/11/2013  . Depressive disorder, not elsewhere classified 07/11/2013  . Anxiety 07/11/2013  . Vitamin B12 deficiency 06/28/2013  . Gastroesophageal reflux disease without esophagitis 06/28/2013  . Anal pruritus 06/28/2013    Past Surgical History:  Procedure Laterality Date  . carpel tunnel    . ESOPHAGOGASTRODUODENOSCOPY N/A 11/06/2014   Procedure: ESOPHAGOGASTRODUODENOSCOPY (EGD);  Surgeon: Charna Elizabeth, MD;  Location[facility] ENDOSCOPY;  Service: Endoscopy;  Laterality: N/A;  . JOINT REPLACEMENT     bilat knee  . TONSILLECTOMY    . TOTAL KNEE REVISION  05/01/2012   Procedure: TOTAL KNEE REVISION;  Surgeon: Raymon Mutton, MD;  Location: [facility] OR;  Service: Orthopedics;  Laterality: Right;     OB History   None      Home Medications    Prior to Admission medications   Medication  Sig Start Date End Date Taking? Authorizing Provider  acetaminophen (TYLENOL) 650 MG CR tablet Take 1,300 mg by mouth every 12 (twelve) hours.   Yes [provider]  antiseptic oral rinse (BIOTENE) LIQD 15 mLs by Mouth Rinse route as needed for dry mouth.   Yes [provider]  ASPERCREME LIDOCAINE EX Apply 1 application topically 3 (three) times daily as needed (osteoarthritis).   Yes [provider]  Carboxymethylcell-Hypromellose (GENTEAL OP) Place 1 drop into both eyes 2  (two) times daily.   Yes [provider]  DULoxetine (CYMBALTA) 60 MG capsule Take 60 mg by mouth at bedtime.  12/10/16  Yes [provider]  ergocalciferol (VITAMIN D2) 1.25 MG (50000 UT) capsule Take 50,000 Units by mouth once a week.   Yes [provider]  fluticasone (FLONASE) 50 MCG/ACT nasal spray Place 1 spray into both nostrils daily.   Yes [provider]  guaiFENesin (ROBITUSSIN) 100 MG/5ML liquid Take 200 mg by mouth every 4 (four) hours as needed for cough.   Yes [provider]  levothyroxine (SYNTHROID, LEVOTHROID) 100 MCG tablet Take 100 mcg by mouth daily before breakfast.   Yes [provider]  lithium carbonate 150 MG capsule Take 300 mg by mouth at bedtime.  06/23/17  Yes [provider]  LORazepam (ATIVAN) 1 MG tablet Take 1 mg by mouth at bedtime. 07/23/17  Yes [provider]  Melatonin 5 MG CAPS Take 5 mg by mouth at bedtime.   Yes [provider]  Menthol, Topical Analgesic, (BIOFREEZE EX) Apply 1 application topically 2 (two) times daily.   Yes [provider]  Multiple Vitamins-Minerals (PRESERVISION AREDS 2+MULTI VIT PO) Take 1 capsule by mouth daily.   Yes [provider]  paliperidone (INVEGA SUSTENNA) 156 MG/ML SUSY injection Inject 156 mg into the muscle every 30 (thirty) days.   Yes [provider]  predniSONE (DELTASONE) 10 MG tablet Take 5-10 mg by mouth See admin instructions. 5 mg every morning, 10 mg every night   Yes [provider]  hydrOXYzine (ATARAX/VISTARIL) 25 MG tablet Take 1 tablet (25 mg total) by mouth every 6 (six) hours. Patient not taking: Reported on 10/16/2018 07/08/17   Bethel Born, PA-C  predniSONE (DELTASONE) 20 MG tablet Take 2 tablets (40 mg total) by mouth daily. Patient not taking: Reported on 10/16/2018 10/02/18   Little, Ambrose Finland, MD  sodium chloride (OCEAN) 0.65 % SOLN nasal spray Place 1 spray into both nostrils as  needed for congestion. Patient not taking: Reported on 10/16/2018 12/06/17   Georgetta Haber, NP    Family History Family History  Problem Relation Age of Onset  . Breast cancer Neg Hx     Social History Social History   Tobacco Use  . Smoking status: Former Games developer  . Smokeless tobacco: Never Used  Substance Use Topics  . Alcohol use: No  . Drug use: No     Allergies   Patient has no known allergies.   Review of Systems Review of Systems  Unable to perform ROS: Mental status change     Physical Exam Updated Vital Signs BP 110/85   Pulse 97   Temp 98.9 F (37.2 C) (Rectal)   Resp (!) 24   SpO2 100%   Physical Exam  Constitutional: No distress.  Tearful.  HENT:  Head: Atraumatic.  Right Ear: External ear normal.  Left Ear: External ear normal.  Eyes: Pupils are equal, round, and reactive to light. EOM  are normal.  Neck: Normal range of motion. Neck supple.  Cardiovascular: Normal rate, regular rhythm, normal heart sounds and intact distal pulses. Exam reveals no gallop and no friction rub.  No murmur heard. Pulses:      Radial pulses are 2+ on the right side, and 2+ on the left side.       Dorsalis pedis pulses are 2+ on the right side, and 2+ on the left side.  Pulmonary/Chest: Effort normal and breath sounds normal. No stridor. No respiratory distress. She has no wheezes. She has no rales. She exhibits no tenderness.  Abdominal: Soft. Bowel sounds are normal. She exhibits no distension and no mass. There is no tenderness. There is no rebound and no guarding. No hernia.  Musculoskeletal: Normal range of motion. She exhibits no edema, tenderness or deformity.  Lymphadenopathy:    She has no cervical adenopathy.  Neurological: She is alert.  Oriented to self only.  Intermittently follows simple commands.  Moves all 4 extremities.  Good strength against resistance of the bilateral upper and lower extremities.  Sensation is unable to be tested at this time as  the patient will not respond to questions.  Skin: Skin is warm and dry. Capillary refill takes less than 2 seconds. No rash noted. She is not diaphoretic. No erythema. No pallor.  Nursing note and vitals reviewed.    ED Treatments / Results  Labs (all labs ordered are listed, but only abnormal results are displayed) Labs Reviewed  COMPREHENSIVE METABOLIC PANEL - Abnormal; Notable for the following components:      Result Value   Creatinine, Ser 1.03 (*)    Total Protein 8.9 (*)    GFR calc non Af Amer 55 (*)    All other components within normal limits  CBC WITH DIFFERENTIAL/PLATELET - Abnormal; Notable for the following components:   MCH 25.4 (*)    RDW 15.7 (*)    All other components within normal limits  ACETAMINOPHEN LEVEL - Abnormal; Notable for the following components:   Acetaminophen (Tylenol), Serum <10 (*)    All other components within normal limits  URINALYSIS, ROUTINE W REFLEX MICROSCOPIC - Abnormal; Notable for the following components:   APPearance HAZY (*)    Ketones, ur 20 (*)    All other components within normal limits  LITHIUM LEVEL - Abnormal; Notable for the following components:   Lithium Lvl <0.06 (*)    All other components within normal limits  ETHANOL  RAPID URINE DRUG SCREEN, HOSP PERFORMED  SALICYLATE LEVEL  TSH    EKG EKG Interpretation  Date/Time:  Sunday October 15 2018 17:55:05 EST Ventricular Rate:  125 PR Interval:    QRS Duration: 80 QT Interval:  296 QTC Calculation: 427 R Axis:   28 Text Interpretation:  Sinus tachycardia Ventricular premature complex Low voltage, precordial leads Nonspecific T abnormalities, lateral leads No significant change since last tracing Confirmed by Melene Plan [phone]) on 10/15/2018 8:56:17 PM   Radiology Dg Chest 2 View  Result Date: 10/15/2018 CLINICAL DATA:  Altered mental status. EXAM: CHEST - 2 VIEW COMPARISON:  10/02/2018 FINDINGS: The heart size and mediastinal contours are within normal  limits. Both lungs are clear. The visualized skeletal structures are unremarkable. IMPRESSION: Stable exam.  No active cardiopulmonary disease. Electronically Signed   By: Myles Rosenthal M.D.   On: 10/15/2018 19:20   Ct Head Wo Contrast  Result Date: 10/15/2018 CLINICAL DATA:  67 year old female with history of bipolar disorder. EXAM: CT HEAD WITHOUT CONTRAST  TECHNIQUE: Contiguous axial images were obtained from the base of the skull through the vertex without intravenous contrast. COMPARISON:  Head CT 05/07/2013. FINDINGS: Brain: Patchy areas of mild decreased attenuation are noted throughout the deep and periventricular white matter of the cerebral hemispheres bilaterally, compatible with mild chronic microvascular ischemic disease. No evidence of acute infarction, hemorrhage, hydrocephalus, extra-axial collection or mass lesion/mass effect. Vascular: No hyperdense vessel or unexpected calcification. Skull: Normal. Negative for fracture or focal lesion. Sinuses/Orbits: No acute finding. Other: None. IMPRESSION: 1. No acute intracranial abnormalities. 2. Mild chronic microvascular ischemic changes in the cerebral white matter redemonstrated, as above. Electronically Signed   By: Trudie Reed M.D.   On: 10/15/2018 19:39    Procedures Procedures (including critical care time)  Medications Ordered in ED Medications  acetaminophen (TYLENOL) tablet 650 mg (has no administration in time range)  alum & mag hydroxide-simeth (MAALOX/MYLANTA) 200-200-20 MG/5ML suspension 30 mL (has no administration in time range)  levothyroxine (SYNTHROID, LEVOTHROID) tablet 100 mcg (has no administration in time range)  fluticasone (FLONASE) 50 MCG/ACT nasal spray 1 spray (has no administration in time range)  pantoprazole (PROTONIX) EC tablet 40 mg (40 mg Oral Not Given 10/16/18 0128)  LORazepam (ATIVAN) injection 1 mg (1 mg Intramuscular Given 10/15/18 1837)     Initial Impression / Assessment and Plan / ED Course    I have reviewed the triage vital signs and the nursing notes.  Pertinent labs & imaging results that were available during my care of the patient were reviewed by me and considered in my medical decision making (see chart for details).     This is a 67 year old female with a history of schizoaffective disorder, bipolar type, hypothyroidism, anxiety, and DJD who lives independently and performs all ADLs at baseline who presents by EMS and is tearful, withdrawn, and is only able to state her name and say "help".  On exam, she is alert, but is only oriented to self.  She intermittently follows simple commands.  Shortly after arrival in the ED, the patient's daughter-in-law and grandson were available to provide additional information which cooperated with [place] story.  Apparently, the patient exhibited a behavior change after starting prednisone and apparently she received 2 separate prescriptions and has been taking both.  However, given significant mental status change, this cannot be attributed to just prednisone.  CT head is negative.  EKG unchanged from previous.  Chest x-ray is unremarkable.  She is mildly dehydrated with mild ketonuria on UA, but labs are otherwise unremarkable.  She was given Ativan for agitation prior to family leaving.  I also called and spoke with the patient's son Denyse Amass who has asked to be kept up-to-date with his mother's care.  Notified by nursing staff at shift change at 1900 that the patient is now sitting upright, speaking in complete, fluent sentences, answering questions appropriately, and chattering away.  When I went in to reassess the patient, she is alert and oriented x4.  She has no complaints at this time.   Consulted TTS who recommended inpatient [facility] psych treatment.  Updated the patient's son on the decision.  The patient's home medications have been placed, except for the prednisone taper as the pharmacy tech is looking into this at this time. Psych hold  orders placed. Please see psych team notes for further documentation of care/dispo. Pt stable at time of med clearance.     Final Clinical Impressions(s) / ED Diagnoses   Final diagnoses:  Bipolar affective disorder, current episode  manic, current episode severity unspecified [facility](HCC)    ED Discharge Orders    None       ,  A, PA-C 10/16/18 0146    Melene PlanFloyd, Dan, DO 10/17/18 1112

## 2018-10-15 NOTE — Progress Notes (Signed)
Per Brenda SievertSpencer Simon, PA pt meets criteria for gero-psych treatment. Pt's nurse Annice PihJackie, RN and EDP McDonald, Mia A, PA-C have been advised. TTS to seek placement.   Princess BruinsAquicha Kadien Lineman, MSW, LCSW Therapeutic Triage Specialist  [phone]

## 2018-10-15 NOTE — ED Notes (Signed)
Call [facility] if decides to admit her, can be contacted at [phone] - Prompt #4 to get the nurse on call. Karle Starch- Regina Carmen RN on call.

## 2018-10-15 NOTE — ED Notes (Signed)
Admitted to 28. Complaint of stomach cramps and loose bowels present times 2 in writers presence. Liquid and unformed. States prior to now she hasnt had a bowel movement for one week. Linens and clothes changed times two. She reports poor sleep prior to hospitalization because she gets negative thoughts on her mind and cant stop thinking about them. Calm, pleasant, states she is in a normal mood.

## 2018-10-15 NOTE — ED Notes (Signed)
ED Provider at bedside. 

## 2018-10-15 NOTE — ED Notes (Signed)
Pt changed to paper scrubs and wanded by security prior to moving to TCU room.

## 2018-10-15 NOTE — ED Notes (Signed)
EKG given to Dr Adela LankFloyd,

## 2018-10-15 NOTE — ED Triage Notes (Addendum)
Per EMS, [facility] w/ hx of bipolar. Pt was pacing hallways, crying. Pt goes to [facility] 3 days a week, sees Dr. Mayford KnifeWilliams.  BP 134/90 HR 110 RR 18

## 2018-10-15 NOTE — ED Notes (Signed)
Patient transported to CT 

## 2018-10-15 NOTE — BH Assessment (Addendum)
Assessment Note  Brenda Murray is an 67 y.o. female who presents to the ED voluntarily. Pt has reportedly been experiencing a manic episode that began about 2 weeks ago. TTS asked the pt to express her reason for being in the ED and she states she does not know how she got to the ED or the reason she is in the ED. Pt states for the past couple of weeks she has been unable to sleep, not eating, crying constantly, and confused. TTS asked the pt if she recalls the last time she slept and she stated "I don't know." Pt also stated "I have not eaten any food in 2 or 3 weeks." Per chart review, pt has a hx of inpt admissions c/o similar concerns. Pt has been [facility] multiple times c/o Bipolar and Schizoaffective disorder. Pt is followed by an OPT provider but does not recall the name of her provider. Chart states she is followed by "Dr. Mayford KnifeWilliams, MD." Pt states she goes to [facility] 3x each week and today the pt reportedly had an episode [facility] in which she was pacing uncontrollably, crying, disoriented, and confused.  Per Donell SievertSpencer Simon, PA pt meets criteria for gero-psych treatment. Pt's nurse Annice PihJackie, RN and EDP McDonald, Mia A, PA-C have been advised. TTS to seek placement.   Diagnosis: Bipolar disorder, current episode manic  Past Medical History:  Past Medical History:  Diagnosis Date  . Anxiety   . Bipolar disorder (HCC)   . Chronic back pain   . History of indigestion   . Hypothyroidism     Past Surgical History:  Procedure Laterality Date  . carpel tunnel    . ESOPHAGOGASTRODUODENOSCOPY N/A 11/06/2014   Procedure: ESOPHAGOGASTRODUODENOSCOPY (EGD);  Surgeon: Charna ElizabethJyothi Mann, MD;  [facility]COPY;  Service: Endoscopy;  Laterality: N/A;  . JOINT REPLACEMENT     bilat knee  . TONSILLECTOMY    . TOTAL KNEE REVISION  05/01/2012   Procedure: TOTAL KNEE REVISION;  Surgeon: Raymon MuttonStephen D Lucey, MD;  Location: [facility] OR;  Service: Orthopedics;  Laterality: Right;     Family History:  Family History  Problem Relation Age of Onset  . Breast cancer Neg Hx     Social History:  reports that she has quit smoking. She has never used smokeless tobacco. She reports that she does not drink alcohol or use drugs.  Additional Social History:  Alcohol / Drug Use Pain Medications: SEE MAR Prescriptions: SEE MAR Over the Counter: SEE MAR History of alcohol / drug use?: No history of alcohol / drug abuse  CIWA: CIWA-Ar BP: 110/85 Pulse Rate: 97 COWS:    Allergies: No Known Allergies  Home Medications:  (Not in a hospital admission)  OB/GYN Status:  No LMP recorded. Patient is postmenopausal.  General Assessment Data Location of Assessment: [facility] ED TTS Assessment: In system Is this a Tele or Face-to-Face Assessment?: Face-to-Face Is this an Initial Assessment or a Re-assessment for this encounter?: Initial Assessment Patient Accompanied by:: (alone) Language Other than English: No Living Arrangements: Other (Comment) What gender do you identify as?: Female Marital status: Widowed Pregnancy Status: No Living Arrangements: Alone Can pt return to current living arrangement?: Yes Admission Status: Voluntary Is patient capable of signing voluntary admission?: Yes Referral Source: Self/Family/Friend Insurance type: MEDICAID      Crisis Care Plan Living Arrangements: Alone Name of Psychiatrist: Dr. Mayford KnifeWilliams, MD Name of Therapist: none reported  Education Status Is patient currently in school?: No  Is the patient employed, unemployed or receiving disability?: Receiving disability income(SSI)  Risk to self with the past 6 months Suicidal Ideation: No Has patient been a risk to self within the past 6 months prior to admission? : No Suicidal Intent: No Has patient had any suicidal intent within the past 6 months prior to admission? : No Is patient at risk for suicide?: No Suicidal Plan?: No Has patient had any suicidal plan within the past 6  months prior to admission? : No Access to Means: No What has been your use of drugs/alcohol within the last 12 months?: denies use  Previous Attempts/Gestures: No Triggers for Past Attempts: None known Intentional Self Injurious Behavior: None Family Suicide History: No Recent stressful life event(s): Other (Comment), Turmoil (Comment)(manic episode) Persecutory voices/beliefs?: No Depression: Yes Depression Symptoms: Insomnia, Tearfulness, Isolating, Feeling angry/irritable, Loss of interest in usual pleasures, Feeling worthless/self pity Substance abuse history and/or treatment for substance abuse?: No Suicide prevention information given to non-admitted patients: Not applicable  Risk to Others within the past 6 months Homicidal Ideation: No Does patient have any lifetime risk of violence toward others beyond the six months prior to admission? : No Thoughts of Harm to Others: No Current Homicidal Intent: No Current Homicidal Plan: No Access to Homicidal Means: No History of harm to others?: No Assessment of Violence: None Noted Does patient have access to weapons?: No Criminal Charges Pending?: No Does patient have a court date: No Is patient on probation?: No  Psychosis Hallucinations: None noted Delusions: None noted  Mental Status Report Appearance/Hygiene: Disheveled Eye Contact: Good Motor Activity: Freedom of movement Speech: Pressured Level of Consciousness: Alert Mood: Anxious, Depressed, Sad Affect: Anxious, Depressed Anxiety Level: Severe Thought Processes: Relevant, Coherent Judgement: Impaired Orientation: Person Obsessive Compulsive Thoughts/Behaviors: None  Cognitive Functioning Concentration: Decreased Memory: Recent Impaired, Remote Impaired Is patient IDD: No Insight: Poor Impulse Control: Poor Appetite: Poor Have you had any weight changes? : Loss Amount of the weight change? (lbs): 5 lbs Sleep: Decreased Total Hours of Sleep: 0 Vegetative  Symptoms: None  ADLScreening [facility] Assessment Services) Patient's cognitive ability adequate to safely complete daily activities?: Yes Patient able to express need for assistance with ADLs?: Yes Independently performs ADLs?: Yes (appropriate for developmental age)  Prior Inpatient Therapy Prior Inpatient Therapy: Yes Prior Therapy Dates: 2019, 2018, and mult other admissions Prior Therapy Facilty/Provider(s): [facility] Reason for Treatment: Schizoaffective, Bipolar  Prior Outpatient Therapy Prior Outpatient Therapy: Yes Prior Therapy Dates: current Prior Therapy Facilty/Provider(s): Dr. Mayford Knife, MD Reason for Treatment: MED MANAGEMENT Does patient have an ACCT team?: No Does patient have Intensive In-House Services?  : No Does patient have [facility] services? : No Does patient have P4CC services?: No  ADL Screening (condition at time of admission) Patient's cognitive ability adequate to safely complete daily activities?: Yes Is the patient deaf or have difficulty hearing?: No Does the patient have difficulty seeing, even when wearing glasses/contacts?: No Does the patient have difficulty concentrating, remembering, or making decisions?: Yes Patient able to express need for assistance with ADLs?: Yes Does the patient have difficulty dressing or bathing?: No Independently performs ADLs?: Yes (appropriate for developmental age) Does the patient have difficulty walking or climbing stairs?: No Weakness of Legs: None Weakness of Arms/Hands: None  Home Assistive Devices/Equipment Home Assistive Devices/Equipment: None    Abuse/Neglect Assessment (Assessment to be complete while patient is alone) Abuse/Neglect Assessment Can Be Completed: Yes Physical Abuse: Denies Verbal Abuse: Denies Sexual Abuse: Denies Exploitation of patient/patient's  resources: Denies Self-Neglect: Denies     Merchant navy officer (For Healthcare) Does Patient Have a Medical Advance  Directive?: Yes Type of Advance Directive: Healthcare Power of Attorney(Tor[place],Corey - son) Copy of Healthcare Power of Attorney in Chart?: No - copy requested          Disposition: Per Donell Sievert, PA pt meets criteria for gero-psych treatment. Pt's nurse Annice Pih, RN and EDP McDonald, Mia A, PA-C have been advised. TTS to seek placement.   Disposition Initial Assessment Completed for this Encounter: Yes Disposition of Patient: Admit Type of inpatient treatment program: Adult(per Donell Sievert, PA) Patient refused recommended treatment: No  On Site Evaluation by:   Reviewed with Physician:    Karolee Ohs 10/15/2018 8:58 PM

## 2018-10-15 NOTE — ED Notes (Signed)
Bed: WA16 Expected date:  Expected time:  Means of arrival:  Comments: Res A 

## 2018-10-16 MED ORDER — DULOXETINE HCL 30 MG PO CPEP: 60.0000 mg | ORAL_CAPSULE | Freq: Every day | ORAL | Status: DC

## 2018-10-16 MED ORDER — LORAZEPAM 2 MG/ML IJ SOLN
1.0000 mg | Freq: Four times a day (QID) | INTRAMUSCULAR | Status: DC | PRN
  Administered 2018-10-16: 1 mg via INTRAMUSCULAR
  Filled 2018-10-16: qty 1

## 2018-10-16 MED ORDER — OLANZAPINE 10 MG IM SOLR
10.0000 mg | Freq: Once | INTRAMUSCULAR | Status: AC
  Administered 2018-10-16: 10 mg via INTRAMUSCULAR
  Filled 2018-10-16: qty 10

## 2018-10-16 MED ORDER — LITHIUM CARBONATE 300 MG PO CAPS
300.0000 mg | ORAL_CAPSULE | Freq: Two times a day (BID) | ORAL | Status: DC
  Administered 2018-10-16: 300 mg via ORAL
  Filled 2018-10-16 (×2): qty 1

## 2018-10-16 MED ORDER — OLANZAPINE 5 MG PO TABS
7.5000 mg | ORAL_TABLET | Freq: Two times a day (BID) | ORAL | Status: DC
  Filled 2018-10-16: qty 1

## 2018-10-16 MED ORDER — STERILE WATER FOR INJECTION IJ SOLN
INTRAMUSCULAR | Status: AC
  Administered 2018-10-16: 11:00:00
  Filled 2018-10-16: qty 10

## 2018-10-16 MED ORDER — LITHIUM CARBONATE 300 MG PO CAPS: 300.0000 mg | ORAL_CAPSULE | Freq: Every day | ORAL | Status: DC

## 2018-10-16 NOTE — ED Notes (Signed)
Alert, up to the bathroom w/o assist, pt quiet, will not answer questions.

## 2018-10-16 NOTE — ED Notes (Signed)
Am med pass presented her with thyroid med and explanation of reason for taking it. Looked at Clinical research associatewriter unblinkingly but did not verbalize but pulled away from Clinical research associatewriter when approached her with the pill and made whimpering noises. Attempted to give her medicine three times with the same result. This is very different than the presentation last night.

## 2018-10-16 NOTE — Progress Notes (Signed)
Accepted to [facility] by Dr Wendall StadeKohl.  Call report at [phone].  Nanine MeansJamison Lord, PMHNP

## 2018-10-16 NOTE — ED Notes (Addendum)
edp Belfi on unit to eval pt.

## 2018-10-16 NOTE — ED Notes (Signed)
Up in room walking intermittingly making whining noise

## 2018-10-16 NOTE — ED Notes (Signed)
Pt continues to decline to eat or drink, and declined to take medications, when approached pt then began to cry. Support given

## 2018-10-16 NOTE — ED Notes (Signed)
Pt declined to allow fall/IVC bracelets to be placed

## 2018-10-16 NOTE — ED Notes (Signed)
Bed: [facility]34 Expected date:  Expected time:  Means of arrival:  Comments: 

## 2018-10-16 NOTE — ED Notes (Addendum)
Dr Charm BargesButler updated with pt's pulse, continue to offer fluids/monitor

## 2018-10-16 NOTE — ED Notes (Signed)
Pt to acute unit, pt pacing hallway, behavior not redirectable. Speech incomprehensible.  Encouragement and support provided. Special checks q 15 mins in place for safety, Video monitoring in place. Will continue to monitor.

## 2018-10-16 NOTE — BH Assessment (Addendum)
[facility] Assessment Progress Note  Per Nelly RoutArchana Kumar, MD, this pt requires psychiatric hospitalization at this time.  Dr Lucianne MussKumar also finds that pt meets criteria for IVC, which she has initiated.  [place] [facility] [place] [facility].  As of this writing confirmation of receipt is pending.  Pt's health care benefits are managed by [facility].  At 11:05 I called them at [phone] and was transferred to voice mail; name was difficult to hear, but this Clinical research associatewriter believes it was Sue Lushndrea.  I left a message, and as of this writing, return call is pending.  Doylene Canninghomas Azrielle Springsteen, [place] Behavioral Health Coordinator [phone]   Addendum:  At 11:26 Hortense RamalMagistrate Watts confirms receipt of IVC documents.  He has since faxed Findings and Custody Order to this Clinical research associatewriter.  At 11:38 I called [employer] and spoke to Designer, television/film setperator [phone], who took demographic information, agreeing to dispatch law enforcement to fill out Return of Service.  This is pending as of this writing.  At 11:33 Cherie Ouchaulisha (direct number [phone]) calls back from [facility].  I informed her of pt's disposition.    Doylene Canninghomas Shameria Trimarco, [place] Behavioral Health Coordinator [phone]

## 2018-10-16 NOTE — ED Notes (Addendum)
[facility] and will [facility] pt for [facility] when she begins eating/drinking and does not have any more loose stools

## 2018-10-16 NOTE — ED Notes (Signed)
Vitals taken but again as with am med pass she was uncooperative with the process. She didn't speak or Therapist, artaide writer in taking her vitals by offering her arm but she did tolerate writer taking it. Pulse elevated as was BP.

## 2018-10-16 NOTE — ED Notes (Addendum)
Up in room and hall walking, continues to make whinning noise, will not talk/answer questions, more easiy redirected

## 2018-10-16 NOTE — ED Notes (Signed)
Jamison DNP into see, pt would not talk with her.

## 2018-10-16 NOTE — ED Notes (Signed)
Pt declined to allow oral or axillary temp tp be taken, and began to cry/whimper

## 2018-10-16 NOTE — BH Assessment (Addendum)
[facility] Assessment Progress Note  Per Nelly RoutArchana Kumar, MD, this pt requires psychiatric hospitalization at this time.  Pt presents under IVC initiated by Dr Lucianne MussKumar.  At 14:20 April Powers [facility] to report that pt has been pre-accepted to their facility by Dr Estill Cottahomas Cornwall; they will be ready to receive pt after 09:00 tomorrow, 10/17/2018.  Nanine MeansJamison Lord, DNP concurs with this decision.  Pt's nurse, Morrie Sheldonshley, has been notified, and agrees to call report to [phone].  [facility] when the [facility] comes.  Doylene Canninghomas Dailan Pfalzgraf Behavioral Health Coordinator [phone]

## 2018-10-16 NOTE — ED Notes (Signed)
Pt son visiting with pt

## 2018-10-16 NOTE — ED Notes (Signed)
Pt continues to decline PO's, up in room making whining noise, will occasionally state "I was wrong.." but is unable to expand

## 2018-10-16 NOTE — ED Notes (Signed)
Pt medicated, up in room making whining noise,
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
## 2018-10-16 NOTE — ED Notes (Signed)
[facility] assist.  Pt very fearful, support given, belongings moved to locker #34

## 2018-10-16 NOTE — ED Provider Notes (Signed)
Please call the patient's son Kandee KeenCory at [phone] to keep him updated on his mother's care and place. He plans to stop by and check on her tomorrow when he is in town.    Barkley BoardsMcDonald, Meklit Cotta A, PA-C 10/16/18 0134    Melene PlanFloyd, Dan, DO 10/17/18 1109

## 2018-10-16 NOTE — ED Notes (Addendum)
Collisha MacIntryre CSW [facility] [phone]) will be in later this afternoon and will bring medications and information.  She reports that pt os schizo affective, has not been taking her medications tearful at times, and has been going on for a while.

## 2018-10-16 NOTE — ED Notes (Signed)
Sheriff on unit to transfer pt to [facility] per MD order. Personal property given to sheriff for transfer. Ambulatory off unit in law enforcement custody.

## 2018-10-16 NOTE — ED Notes (Signed)
Sitting on side of bed, continues to be essentially non verbal, declining to eat.

## 2018-10-16 NOTE — ED Notes (Signed)
Time approx-[facility] CSW here, and spoke with CSW, medications and care giver contact info in chart

## 2018-10-16 NOTE — ED Notes (Signed)
This nurse [facility] and informed them pt in route.

## 2018-10-16 NOTE — ED Notes (Signed)
Message left for sheriff for [facility].

## 2018-10-16 NOTE — ED Notes (Addendum)
OK to move pt to acute care per Dr Charm BargesButler

## 2018-12-14 ENCOUNTER — Other Ambulatory Visit: Payer: Self-pay | Admitting: Internal Medicine

## 2018-12-14 DIAGNOSIS — Z1231 Encounter for screening mammogram for malignant neoplasm of breast: Secondary | ICD-10-CM

## 2019-01-01 ENCOUNTER — Other Ambulatory Visit: Payer: Self-pay | Admitting: Gerontology

## 2019-01-01 ENCOUNTER — Ambulatory Visit
Admission: RE | Admit: 2019-01-01 | Discharge: 2019-01-01 | Disposition: A | Discharge status: Home / Self Care | Payer: No Typology Code available for payment source | Source: Ambulatory Visit | Attending: Gerontology | Admitting: Gerontology

## 2019-01-01 DIAGNOSIS — R062 Wheezing: Secondary | ICD-10-CM

## 2019-01-01 DIAGNOSIS — R269 Unspecified abnormalities of gait and mobility: Secondary | ICD-10-CM

## 2019-01-01 DIAGNOSIS — M79672 Pain in left foot: Secondary | ICD-10-CM

## 2019-01-01 DIAGNOSIS — R059 Cough, unspecified: Secondary | ICD-10-CM

## 2019-01-01 DIAGNOSIS — R609 Edema, unspecified: Secondary | ICD-10-CM

## 2019-01-01 DIAGNOSIS — R05 Cough: Secondary | ICD-10-CM

## 2019-02-05 ENCOUNTER — Ambulatory Visit
Admission: RE | Admit: 2019-02-05 | Discharge: 2019-02-05 | Disposition: A | Discharge status: Home / Self Care | Payer: Medicare (Managed Care) | Source: Ambulatory Visit | Attending: Internal Medicine | Admitting: Internal Medicine

## 2019-02-05 ENCOUNTER — Other Ambulatory Visit: Payer: Self-pay

## 2019-02-05 DIAGNOSIS — Z1231 Encounter for screening mammogram for malignant neoplasm of breast: Secondary | ICD-10-CM

## 2019-06-18 ENCOUNTER — Other Ambulatory Visit: Payer: Self-pay | Admitting: Gerontology

## 2019-06-18 ENCOUNTER — Ambulatory Visit
Admission: RE | Admit: 2019-06-18 | Discharge: 2019-06-18 | Disposition: A | Discharge status: Home / Self Care | Payer: Medicare (Managed Care) | Source: Ambulatory Visit | Attending: Gerontology | Admitting: Gerontology

## 2019-06-18 DIAGNOSIS — M25562 Pain in left knee: Secondary | ICD-10-CM

## 2019-06-18 DIAGNOSIS — M25561 Pain in right knee: Secondary | ICD-10-CM

## 2019-09-14 ENCOUNTER — Other Ambulatory Visit: Payer: Self-pay | Admitting: Nurse Practitioner

## 2019-09-14 ENCOUNTER — Ambulatory Visit
Admission: RE | Admit: 2019-09-14 | Discharge: 2019-09-14 | Disposition: A | Discharge status: Home / Self Care | Payer: No Typology Code available for payment source | Source: Ambulatory Visit | Attending: Nurse Practitioner | Admitting: Nurse Practitioner

## 2019-09-14 DIAGNOSIS — R05 Cough: Secondary | ICD-10-CM

## 2019-09-14 DIAGNOSIS — R062 Wheezing: Secondary | ICD-10-CM

## 2019-09-14 DIAGNOSIS — R109 Unspecified abdominal pain: Secondary | ICD-10-CM

## 2019-09-14 DIAGNOSIS — R0602 Shortness of breath: Secondary | ICD-10-CM

## 2019-09-14 DIAGNOSIS — R059 Cough, unspecified: Secondary | ICD-10-CM

## 2019-10-04 ENCOUNTER — Ambulatory Visit
Admission: RE | Admit: 2019-10-04 | Discharge: 2019-10-04 | Disposition: A | Discharge status: Home / Self Care | Payer: No Typology Code available for payment source | Source: Ambulatory Visit | Attending: Family Medicine | Admitting: Family Medicine

## 2019-10-04 ENCOUNTER — Other Ambulatory Visit: Payer: Self-pay | Admitting: Family Medicine

## 2019-10-04 DIAGNOSIS — J189 Pneumonia, unspecified organism: Secondary | ICD-10-CM

## 2019-12-20 ENCOUNTER — Other Ambulatory Visit: Payer: Self-pay | Admitting: Gerontology

## 2019-12-20 ENCOUNTER — Ambulatory Visit
Admission: RE | Admit: 2019-12-20 | Discharge: 2019-12-20 | Disposition: A | Discharge status: Home / Self Care | Payer: Medicare (Managed Care) | Source: Ambulatory Visit | Attending: Gerontology | Admitting: Gerontology

## 2019-12-20 DIAGNOSIS — M545 Low back pain, unspecified: Secondary | ICD-10-CM

## 2019-12-31 ENCOUNTER — Other Ambulatory Visit: Payer: Self-pay | Admitting: Gerontology

## 2019-12-31 DIAGNOSIS — Z1231 Encounter for screening mammogram for malignant neoplasm of breast: Secondary | ICD-10-CM

## 2020-01-23 ENCOUNTER — Other Ambulatory Visit: Payer: Self-pay | Admitting: Gerontology

## 2020-01-23 DIAGNOSIS — M545 Low back pain, unspecified: Secondary | ICD-10-CM

## 2020-02-19 ENCOUNTER — Ambulatory Visit
Admission: RE | Admit: 2020-02-19 | Discharge: 2020-02-19 | Disposition: A | Discharge status: Home / Self Care | Payer: Medicare (Managed Care) | Source: Ambulatory Visit | Attending: Gerontology | Admitting: Gerontology

## 2020-02-19 ENCOUNTER — Other Ambulatory Visit: Payer: Self-pay

## 2020-02-19 DIAGNOSIS — M545 Low back pain, unspecified: Secondary | ICD-10-CM

## 2020-02-20 ENCOUNTER — Other Ambulatory Visit: Payer: Self-pay | Admitting: Gerontology

## 2020-02-20 ENCOUNTER — Ambulatory Visit
Admission: RE | Admit: 2020-02-20 | Discharge: 2020-02-20 | Disposition: A | Discharge status: Home / Self Care | Payer: Medicare (Managed Care) | Source: Ambulatory Visit | Attending: Gerontology | Admitting: Gerontology

## 2020-02-20 DIAGNOSIS — R0602 Shortness of breath: Secondary | ICD-10-CM

## 2020-02-23 ENCOUNTER — Encounter (HOSPITAL_COMMUNITY): Payer: Self-pay

## 2020-02-23 ENCOUNTER — Emergency Department (HOSPITAL_COMMUNITY): Payer: Medicare (Managed Care)

## 2020-02-23 ENCOUNTER — Emergency Department (HOSPITAL_COMMUNITY)
Admission: EM | Admit: 2020-02-23 | Discharge: 2020-02-23 | Disposition: A | Discharge status: Home / Self Care | Payer: Medicare (Managed Care) | Attending: Emergency Medicine | Admitting: Emergency Medicine

## 2020-02-23 ENCOUNTER — Other Ambulatory Visit: Payer: Self-pay

## 2020-02-23 DIAGNOSIS — Z87891 Personal history of nicotine dependence: Secondary | ICD-10-CM | POA: Insufficient documentation

## 2020-02-23 DIAGNOSIS — Z79899 Other long term (current) drug therapy: Secondary | ICD-10-CM | POA: Insufficient documentation

## 2020-02-23 DIAGNOSIS — Z96653 Presence of artificial knee joint, bilateral: Secondary | ICD-10-CM | POA: Diagnosis not present

## 2020-02-23 DIAGNOSIS — E039 Hypothyroidism, unspecified: Secondary | ICD-10-CM | POA: Diagnosis not present

## 2020-02-23 DIAGNOSIS — J40 Bronchitis, not specified as acute or chronic: Secondary | ICD-10-CM | POA: Diagnosis not present

## 2020-02-23 DIAGNOSIS — R0602 Shortness of breath: Secondary | ICD-10-CM | POA: Diagnosis present

## 2020-02-23 LAB — CBC
HCT: 33.5 % — ABNORMAL LOW (ref 36.0–46.0)
Hemoglobin: 10 g/dL — ABNORMAL LOW (ref 12.0–15.0)
MCH: 26.4 pg (ref 26.0–34.0)
MCHC: 29.9 g/dL — ABNORMAL LOW (ref 30.0–36.0)
MCV: 88.4 fL (ref 80.0–100.0)
Platelets: 239 10*3/uL (ref 150–400)
RBC: 3.79 MIL/uL — ABNORMAL LOW (ref 3.87–5.11)
RDW: 15.6 % — ABNORMAL HIGH (ref 11.5–15.5)
WBC: 3.9 10*3/uL — ABNORMAL LOW (ref 4.0–10.5)
nRBC: 0 % (ref 0.0–0.2)

## 2020-02-23 LAB — TROPONIN I (HIGH SENSITIVITY)
Troponin I (High Sensitivity): 3 ng/L (ref <18)
Troponin I (High Sensitivity): 4 ng/L (ref <18)

## 2020-02-23 LAB — BASIC METABOLIC PANEL
Anion gap: 8 (ref 5–15)
BUN: 17 mg/dL (ref 8–23)
CO2: 31 mmol/L (ref 22–32)
Calcium: 9.8 mg/dL (ref 8.9–10.3)
Chloride: 100 mmol/L (ref 98–111)
Creatinine, Ser: 0.74 mg/dL (ref 0.44–1.00)
GFR calc Af Amer: >60 mL/min (ref >60)
GFR calc non Af Amer: >60 mL/min (ref >60)
Glucose, Bld: 103 mg/dL — ABNORMAL HIGH (ref 70–99)
Potassium: 4.1 mmol/L (ref 3.5–5.1)
Sodium: 139 mmol/L (ref 135–145)

## 2020-02-23 LAB — BRAIN NATRIURETIC PEPTIDE: B Natriuretic Peptide: 9 pg/mL (ref 0.0–100.0)

## 2020-02-23 MED ORDER — SODIUM CHLORIDE 0.9% FLUSH: 3.0000 mL | Freq: Once | INTRAVENOUS | Status: DC

## 2020-02-23 MED ORDER — ALBUTEROL SULFATE HFA 108 (90 BASE) MCG/ACT IN AERS
2.0000 | INHALATION_SPRAY | Freq: Once | RESPIRATORY_TRACT | Status: AC
  Administered 2020-02-23: 2 via RESPIRATORY_TRACT
  Filled 2020-02-23: qty 6.7

## 2020-02-23 NOTE — ED Notes (Signed)
Patient verbalizes understanding of discharge instructions. Opportunity for questioning and answers were provided. Armband removed by staff, pt discharged from ED.  

## 2020-02-23 NOTE — ED Provider Notes (Signed)
[facility] EMERGENCY DEPARTMENT Provider Note   CSN: [id number] Arrival date & time: 02/23/20  1232     History Chief Complaint  Patient presents with  . Shortness of Breath    Brenda Murray is a 69 y.o. female.  HPI Patient reports she is felt short of breath for about 2 months.  She reports she does sometimes cough and bring up some sputum.  She reports she does try over-the-counter cough medicine but it does not help much.  She has not had any fevers or chills.  No chest pain.  Patient denies swelling or pain of her legs.  Reports she has been having problems with back pain but her doctor is evaluating that.  She describes a recent MRI.  No acute complaints of weakness or numbness of the extremities.    Past Medical History:  Diagnosis Date  . Anxiety   . Bipolar disorder (HCC)   . Chronic back pain   . History of indigestion   . Hypothyroidism     Patient Active Problem List   Diagnosis Date Noted  . Schizoaffective disorder, bipolar type (HCC) 11/10/2017  . Hoarseness 11/09/2016  . Parotid swelling 03/22/2016  . Nausea and vomiting 11/04/2014  . Hypothyroid 11/04/2014  . Metabolic alkalosis 11/04/2014  . Hypokalemia 11/04/2014  . Knee joint replaced by other means 07/16/2014  . Confusion 07/11/2013  . Depressive disorder, not elsewhere classified 07/11/2013  . Anxiety 07/11/2013  . Vitamin B12 deficiency 06/28/2013  . Gastroesophageal reflux disease without esophagitis 06/28/2013  . Anal pruritus 06/28/2013    Past Surgical History:  Procedure Laterality Date  . carpel tunnel    . ESOPHAGOGASTRODUODENOSCOPY N/A 11/06/2014   Procedure: ESOPHAGOGASTRODUODENOSCOPY (EGD);  Surgeon: Charna Elizabeth, MD;  Location: [facility] ENDOSCOPY;  Service: Endoscopy;  Laterality: N/A;  . JOINT REPLACEMENT     bilat knee  . TONSILLECTOMY    . TOTAL KNEE REVISION  05/01/2012   Procedure: TOTAL KNEE REVISION;  Surgeon: Raymon Mutton, MD;  Location: [facility] OR;  Service:  Orthopedics;  Laterality: Right;     OB History   No obstetric history on file.     Family History  Problem Relation Age of Onset  . Breast cancer Neg Hx     Social History   Tobacco Use  . Smoking status: Former Games developer  . Smokeless tobacco: Never Used  Substance Use Topics  . Alcohol use: No  . Drug use: No    Home Medications Prior to Admission medications   Medication Sig Start Date End Date Taking? Authorizing Provider  acetaminophen (TYLENOL) 650 MG CR tablet Take 1,300 mg by mouth every 12 (twelve) hours.    [provider]  antiseptic oral rinse (BIOTENE) LIQD 15 mLs by Mouth Rinse route as needed for dry mouth.    [provider]  ASPERCREME LIDOCAINE EX Apply 1 application topically 3 (three) times daily as needed (osteoarthritis).    [provider]  Carboxymethylcell-Hypromellose (GENTEAL OP) Place 1 drop into both eyes 2 (two) times daily.    [provider]  DULoxetine (CYMBALTA) 60 MG capsule Take 60 mg by mouth at bedtime.  12/10/16   [provider]  ergocalciferol (VITAMIN D2) 1.25 MG (50000 UT) capsule Take 50,000 Units by mouth once a week.    [provider]  fluticasone (FLONASE) 50 MCG/ACT nasal spray Place 1 spray into both nostrils daily.    [provider]  guaiFENesin (ROBITUSSIN) 100 MG/5ML liquid Take 200 mg  by mouth every 4 (four) hours as needed for cough.    [provider]  hydrOXYzine (ATARAX/VISTARIL) 25 MG tablet Take 1 tablet (25 mg total) by mouth every 6 (six) hours. Patient not taking: Reported on 10/16/2018 07/08/17   Recardo Evangelist, PA-C  levothyroxine (SYNTHROID, LEVOTHROID) 100 MCG tablet Take 100 mcg by mouth daily before breakfast.    [provider]  lithium carbonate 150 MG capsule Take 300 mg by mouth at bedtime.  06/23/17   [provider]  LORazepam (ATIVAN) 1 MG tablet Take 1 mg by mouth at bedtime. 07/23/17   [provider]    Melatonin 5 MG CAPS Take 5 mg by mouth at bedtime.    [provider]  Menthol, Topical Analgesic, (BIOFREEZE EX) Apply 1 application topically 2 (two) times daily.    [provider]  Multiple Vitamins-Minerals (PRESERVISION AREDS 2+MULTI VIT PO) Take 1 capsule by mouth daily.    [provider]  paliperidone (INVEGA SUSTENNA) 156 MG/ML SUSY injection Inject 156 mg into the muscle every 30 (thirty) days.    [provider]  predniSONE (DELTASONE) 10 MG tablet Take 5-10 mg by mouth See admin instructions. 5 mg every morning, 10 mg every night    [provider]  predniSONE (DELTASONE) 20 MG tablet Take 2 tablets (40 mg total) by mouth daily. Patient not taking: Reported on 10/16/2018 10/02/18   Little, Wenda Overland, MD  sodium chloride (OCEAN) 0.65 % SOLN nasal spray Place 1 spray into both nostrils as needed for congestion. Patient not taking: Reported on 10/16/2018 12/06/17   Zigmund Gottron, NP    Allergies    Patient has no known allergies.  Review of Systems   Review of Systems 10 Systems reviewed and are negative for acute change except as noted in the HPI.  Physical Exam Updated Vital Signs BP 137/82   Pulse 84   Temp 98.1 F (36.7 C) (Oral)   Resp 14   Wt 99.3 kg   SpO2 100%   BMI 42.77 kg/m   Physical Exam Constitutional:      Comments: Alert nontoxic and clinically well in appearance.  No respiratory distress at rest.  HENT:     Head: Normocephalic and atraumatic.     Nose: Nose normal.     Mouth/Throat:     Mouth: Mucous membranes are moist.     Pharynx: Oropharynx is clear.  Eyes:     Extraocular Movements: Extraocular movements intact.     Conjunctiva/sclera: Conjunctivae normal.  Cardiovascular:     Rate and Rhythm: Normal rate and regular rhythm.  Pulmonary:     Effort: Pulmonary effort is normal.     Breath sounds: Normal breath sounds.  Abdominal:     General: There is no distension.     Palpations:  Abdomen is soft.     Tenderness: There is no abdominal tenderness. There is no guarding.  Musculoskeletal:        General: No swelling. Normal range of motion.     Cervical back: Neck supple.     Right lower leg: No edema.     Left lower leg: No edema.  Skin:    General: Skin is warm and dry.  Neurological:     General: No focal deficit present.     Mental Status: She is oriented to person, place, and time.     Coordination: Coordination normal.  Psychiatric:        Mood and Affect: Mood  normal.     ED Results / Procedures / Treatments   Labs (all labs ordered are listed, but only abnormal results are displayed) Labs Reviewed  BASIC METABOLIC PANEL - Abnormal; Notable for the following components:      Result Value   Glucose, Bld 103 (*)    All other components within normal limits  CBC - Abnormal; Notable for the following components:   WBC 3.9 (*)    RBC 3.79 (*)    Hemoglobin 10.0 (*)    HCT 33.5 (*)    MCHC 29.9 (*)    RDW 15.6 (*)    All other components within normal limits  BRAIN NATRIURETIC PEPTIDE  TROPONIN I (HIGH SENSITIVITY)  TROPONIN I (HIGH SENSITIVITY)    EKG EKG Interpretation  Date/Time:  Saturday February 23 2020 12:38:44 EDT Ventricular Rate:  85 PR Interval:  166 QRS Duration: 64 QT Interval:  372 QTC Calculation: 442 R Axis:   65 Text Interpretation: Normal sinus rhythm Normal ECG agree. no sig change from previosu Confirmed by Arby Barrette [phone]) on 02/23/2020 4:47:38 PM   Radiology DG Chest 2 View  Result Date: 02/23/2020 CLINICAL DATA:  Shortness of breath EXAM: CHEST - 2 VIEW COMPARISON:  February 20, 2020 FINDINGS: Slight interstitial thickening is chronic and stable. There is no edema or airspace opacity. Heart size and pulmonary vascularity are normal. No adenopathy. No bone lesions. IMPRESSION: Stable mild interstitial thickening. No edema or airspace opacity. Cardiac silhouette normal. Electronically Signed   By: Bretta Bang III  M.D.   On: 02/23/2020 13:20    Procedures Procedures (including critical care time)  Medications Ordered in ED Medications  sodium chloride flush (NS) 0.9 % injection 3 mL (has no administration in time range)  albuterol (VENTOLIN HFA) 108 (90 Base) MCG/ACT inhaler 2 puff (has no administration in time range)    ED Course  I have reviewed the triage vital signs and the nursing notes.  Pertinent labs & imaging results that were available during my care of the patient were reviewed by me and considered in my medical decision making (see chart for details).    MDM Rules/Calculators/A&P                     Patient is clinically well in appearance.  Vital signs are normal.  She has had nonspecific perception of shortness of breath and slight productive cough.  Chest x-ray shows some possible bronchitic changes but otherwise no infiltrates.  Patient has no peripheral edema or calf tenderness.  EKG and troponin are normal.  Very low suspicion for cardiac etiology for symptoms.  At this time will try symptomatic treatment with an albuterol inhaler.  Patient is advised to follow-up with her PCP within the next week for recheck.  Return precautions provided.  Do not suspect Covid at this time.  Patient's oxygen saturations are 100% on room air, respirations are nonlabored and chest x-ray is clear. Final Clinical Impression(s) / ED Diagnoses Final diagnoses:  Bronchitis    Rx / DC Orders ED Discharge Orders    None       Arby Barrette, MD 02/23/20 1649

## 2020-02-23 NOTE — ED Notes (Signed)
Please call son Ginnie Smart [phone] when in exam room.

## 2020-02-23 NOTE — Discharge Instructions (Addendum)
1.  Your chest x-ray does not show any pneumonia.  Your labs and EKG did not show any signs of heart attack. 2.  Your symptoms sound suggestive of bronchitis.  You may use the albuterol inhaler given to you at the emergency department.  You may take 2 puffs every 4 hours if needed for coughing or shortness of breath. 3.  Make an appointment to see your family doctor within the next 3 to 5 days. 4.  Return to the emergency department if you have worsening shortness of breath, fever, chest pain or other concerning symptoms.

## 2020-02-23 NOTE — ED Triage Notes (Signed)
Onset several months shortness of breath.  Talking in complete sentences, breathing not labored. Pt states she had a xray yesterday.  No other complaints.

## 2020-02-25 ENCOUNTER — Ambulatory Visit: Payer: Medicare (Managed Care)

## 2020-02-29 ENCOUNTER — Other Ambulatory Visit: Payer: Self-pay

## 2020-02-29 ENCOUNTER — Emergency Department (HOSPITAL_COMMUNITY)
Admission: EM | Admit: 2020-02-29 | Discharge: 2020-02-29 | Disposition: A | Discharge status: Home / Self Care | Payer: Medicare (Managed Care) | Attending: Emergency Medicine | Admitting: Emergency Medicine

## 2020-02-29 DIAGNOSIS — M545 Low back pain: Secondary | ICD-10-CM | POA: Insufficient documentation

## 2020-02-29 DIAGNOSIS — M4800 Spinal stenosis, site unspecified: Secondary | ICD-10-CM | POA: Diagnosis not present

## 2020-02-29 DIAGNOSIS — G8929 Other chronic pain: Secondary | ICD-10-CM

## 2020-02-29 MED ORDER — HYDROCODONE-ACETAMINOPHEN 5-325 MG PO TABS
1.0000 | ORAL_TABLET | Freq: Once | ORAL | Status: AC
  Administered 2020-02-29: 18:00:00 1 via ORAL
  Filled 2020-02-29: qty 1

## 2020-02-29 MED ORDER — KETOROLAC TROMETHAMINE 60 MG/2ML IM SOLN
60.0000 mg | Freq: Once | INTRAMUSCULAR | Status: AC
  Administered 2020-02-29: 60 mg via INTRAMUSCULAR
  Filled 2020-02-29: qty 2

## 2020-02-29 NOTE — ED Provider Notes (Signed)
[facility] EMERGENCY DEPARTMENT Provider Note   CSN: [id number] Arrival date & time: 02/29/20  1504     History Chief Complaint  Patient presents with  . Back Pain    Brenda Murray is a 69 y.o. female.  HPI Patient reports she is having ongoing lower back pain.  She indicates her lower back and then radiating into her left buttock.  She reports sometimes it radiates down the back of her leg to.  This is been a very longstanding problem.  Patient reports that her pain medications are not helping her.  She has tried patches, naproxen and Tylenol without relief.  I was able to talk to the patient's PCP and covering physician at [facility] of try it.  Patient is set up for spinal injections on 419.  She has had MRI 3\30\21.  Her PCP has called in gabapentin for her today and is recommending alternating naproxen and Tylenol.    Past Medical History:  Diagnosis Date  . Anxiety   . Bipolar disorder (HCC)   . Chronic back pain   . History of indigestion   . Hypothyroidism     Patient Active Problem List   Diagnosis Date Noted  . Schizoaffective disorder, bipolar type (HCC) 11/10/2017  . Hoarseness 11/09/2016  . Parotid swelling 03/22/2016  . Nausea and vomiting 11/04/2014  . Hypothyroid 11/04/2014  . Metabolic alkalosis 11/04/2014  . Hypokalemia 11/04/2014  . Knee joint replaced by other means 07/16/2014  . Confusion 07/11/2013  . Depressive disorder, not elsewhere classified 07/11/2013  . Anxiety 07/11/2013  . Vitamin B12 deficiency 06/28/2013  . Gastroesophageal reflux disease without esophagitis 06/28/2013  . Anal pruritus 06/28/2013    Past Surgical History:  Procedure Laterality Date  . carpel tunnel    . ESOPHAGOGASTRODUODENOSCOPY N/A 11/06/2014   Procedure: ESOPHAGOGASTRODUODENOSCOPY (EGD);  Surgeon: Charna Elizabeth, MD;  Location: [facility] ENDOSCOPY;  Service: Endoscopy;  Laterality: N/A;  . JOINT REPLACEMENT     bilat knee  . TONSILLECTOMY    . TOTAL KNEE  REVISION  05/01/2012   Procedure: TOTAL KNEE REVISION;  Surgeon: Raymon Mutton, MD;  Location: [facility] OR;  Service: Orthopedics;  Laterality: Right;     OB History   No obstetric history on file.     Family History  Problem Relation Age of Onset  . Breast cancer Neg Hx     Social History   Tobacco Use  . Smoking status: Former Games developer  . Smokeless tobacco: Never Used  Substance Use Topics  . Alcohol use: No  . Drug use: No    Home Medications Prior to Admission medications   Medication Sig Start Date End Date Taking? Authorizing Provider  acetaminophen (TYLENOL) 650 MG CR tablet Take 1,300 mg by mouth every 12 (twelve) hours.    [provider]  antiseptic oral rinse (BIOTENE) LIQD 15 mLs by Mouth Rinse route as needed for dry mouth.    [provider]  ASPERCREME LIDOCAINE EX Apply 1 application topically 3 (three) times daily as needed (osteoarthritis).    [provider]  Carboxymethylcell-Hypromellose (GENTEAL OP) Place 1 drop into both eyes 2 (two) times daily.    [provider]  DULoxetine (CYMBALTA) 60 MG capsule Take 60 mg by mouth at bedtime.  12/10/16   [provider]  ergocalciferol (VITAMIN D2) 1.25 MG (50000 UT) capsule Take 50,000 Units by mouth once a week.    [provider]  fluticasone (FLONASE) 50 MCG/ACT nasal spray Place 1  spray into both nostrils daily.    [provider]  guaiFENesin (ROBITUSSIN) 100 MG/5ML liquid Take 200 mg by mouth every 4 (four) hours as needed for cough.    [provider]  hydrOXYzine (ATARAX/VISTARIL) 25 MG tablet Take 1 tablet (25 mg total) by mouth every 6 (six) hours. Patient not taking: Reported on 10/16/2018 07/08/17   Recardo Evangelist, PA-C  levothyroxine (SYNTHROID, LEVOTHROID) 100 MCG tablet Take 100 mcg by mouth daily before breakfast.    [provider]  lithium carbonate 150 MG capsule Take 300 mg by mouth at bedtime.  06/23/17   [provider]  LORazepam (ATIVAN) 1 MG tablet Take 1 mg by mouth at bedtime. 07/23/17   [provider]  Melatonin 5 MG CAPS Take 5 mg by mouth at bedtime.    [provider]  Menthol, Topical Analgesic, (BIOFREEZE EX) Apply 1 application topically 2 (two) times daily.    [provider]  Multiple Vitamins-Minerals (PRESERVISION AREDS 2+MULTI VIT PO) Take 1 capsule by mouth daily.    [provider]  paliperidone (INVEGA SUSTENNA) 156 MG/ML SUSY injection Inject 156 mg into the muscle every 30 (thirty) days.    [provider]  predniSONE (DELTASONE) 10 MG tablet Take 5-10 mg by mouth See admin instructions. 5 mg every morning, 10 mg every night    [provider]  predniSONE (DELTASONE) 20 MG tablet Take 2 tablets (40 mg total) by mouth daily. Patient not taking: Reported on 10/16/2018 10/02/18   Little, Wenda Overland, MD  sodium chloride (OCEAN) 0.65 % SOLN nasal spray Place 1 spray into both nostrils as needed for congestion. Patient not taking: Reported on 10/16/2018 12/06/17   Zigmund Gottron, NP    Allergies    Patient has no known allergies.  Review of Systems   Review of Systems 10 Systems reviewed and are negative for acute change except as noted in the HPI.  Physical Exam Updated Vital Signs BP 127/79 (BP Location: Left Arm)   Pulse 86   Temp 98.1 F (36.7 C) (Oral)   Resp 18   SpO2 97%   Physical Exam Constitutional:      Comments: Patient is alert and nontoxic.  Mental status is clear.  No respiratory distress.  Well-nourished well-developed with central obesity.  HENT:     Head: Normocephalic and atraumatic.  Eyes:     Extraocular Movements: Extraocular movements intact.  Cardiovascular:     Rate and Rhythm: Normal rate and regular rhythm.  Pulmonary:     Effort: Pulmonary effort is normal.     Breath sounds: Normal breath sounds.  Abdominal:     Palpations: Abdomen is soft.     Tenderness: There is no  abdominal tenderness. There is no guarding.  Musculoskeletal:        General: No swelling or tenderness. Normal range of motion.     Right lower leg: No edema.     Left lower leg: No edema.     Comments: Patient does not have any peripheral edema.  Skin condition of the lower extremities is good.  Patient dorsa some discomfort to palpation in the central lower back in the left SI region.  Skin:    General: Skin is warm and dry.  Neurological:     General: No focal deficit present.     Mental Status: She is oriented to person, place, and time.     Cranial Nerves: No cranial nerve deficit.  Coordination: Coordination normal.     Gait: Gait normal.     Comments: Patient sat up at edge of the bed and ambulated without difficulty.  Her gait is steady.  Gait is not antalgic or ataxic.  Psychiatric:        Mood and Affect: Mood normal.     ED Results / Procedures / Treatments   Labs (all labs ordered are listed, but only abnormal results are displayed) Labs Reviewed - No data to display  EKG None  Radiology No results found.  Procedures Procedures (including critical care time)  Medications Ordered in ED Medications  ketorolac (TORADOL) injection 60 mg (60 mg Intramuscular Given 02/29/20 1751)  HYDROcodone-acetaminophen (NORCO/VICODIN) 5-325 MG per tablet 1 tablet (1 tablet Oral Given 02/29/20 1751)    ED Course  I have reviewed the triage vital signs and the nursing notes.  Pertinent labs & imaging results that were available during my care of the patient were reviewed by me and considered in my medical decision making (see chart for details).    MDM Rules/Calculators/A&P                     I was able to review the patient's case with Katy Apo, the patient's PCP.  She advises that they are managing the patient's pain and she has all appropriate referrals.  She is going to be seen by Dr. Deniece Portela on [phone] for back injections.  Patient is getting naproxen and Tylenol for pain  which she is supposed to be taking along with gabapentin sent to her home today.  She agrees with giving the patient a Toradol shot and 1 Vicodin tablet in the emergency department but request we do not prescribe narcotics on outpatient basis.  Patient had an MRI on 3\30\21 showing moderately severe stenosis and facet arthropathy.  Patient also is referred for PT and the patient with orthopedics.  Patient is alert and appropriate.  No signs of acute illness at this time.  Her mental status is clear.  She is up and ambulatory without difficulty.  She does report ongoing pain.  She is given an IM dose of Toradol and an oral Vicodin to help with acute pain management.  Patient discharged in good condition.  Final Clinical Impression(s) / ED Diagnoses Final diagnoses:  Chronic right-sided low back pain, unspecified whether sciatica present  Spinal stenosis, unspecified spinal region    Rx / DC Orders ED Discharge Orders    None       Arby Barrette, MD 02/29/20 [phone]

## 2020-02-29 NOTE — Discharge Instructions (Addendum)
1.  Your case has been reviewed with the provider at [facility] and your primary provider, Katy Apo.  They are working with spine specialist to treat your pain.  You have been given a prescription for gabapentin and are to continue alternating your naproxen and Tylenol. 2.  You have gotten a shot for pain medication and a hydrocodone tablet in the emergency department help with pain today. 3.  You will have to continue working with your providers for ongoing prescriptions

## 2020-02-29 NOTE — ED Triage Notes (Signed)
Patient complains of lower back pain with radiation to bilateral legs x 2 months. Denies trauma. Worse with any ROM

## 2020-02-29 NOTE — ED Notes (Signed)
Patient given discharge instructions patient verbalizes understanding. 

## 2020-03-03 ENCOUNTER — Ambulatory Visit
Admission: RE | Admit: 2020-03-03 | Discharge: 2020-03-03 | Disposition: A | Discharge status: Home / Self Care | Payer: Medicare (Managed Care) | Source: Ambulatory Visit | Attending: Gerontology | Admitting: Gerontology

## 2020-03-03 ENCOUNTER — Other Ambulatory Visit: Payer: Self-pay

## 2020-03-03 DIAGNOSIS — Z1231 Encounter for screening mammogram for malignant neoplasm of breast: Secondary | ICD-10-CM

## 2020-03-16 ENCOUNTER — Other Ambulatory Visit: Payer: Self-pay

## 2020-03-16 ENCOUNTER — Ambulatory Visit (HOSPITAL_COMMUNITY)
Admission: EM | Admit: 2020-03-16 | Discharge: 2020-03-16 | Disposition: A | Discharge status: Home / Self Care | Payer: Medicare (Managed Care) | Attending: Family Medicine | Admitting: Family Medicine

## 2020-03-16 ENCOUNTER — Encounter (HOSPITAL_COMMUNITY): Payer: Self-pay

## 2020-03-16 DIAGNOSIS — M79605 Pain in left leg: Secondary | ICD-10-CM | POA: Diagnosis not present

## 2020-03-16 MED ORDER — KETOROLAC TROMETHAMINE 30 MG/ML IJ SOLN
30.0000 mg | Freq: Once | INTRAMUSCULAR | Status: AC
  Administered 2020-03-16: 18:00:00 30 mg via INTRAMUSCULAR

## 2020-03-16 MED ORDER — KETOROLAC TROMETHAMINE 30 MG/ML IJ SOLN
INTRAMUSCULAR | Status: AC
  Filled 2020-03-16: qty 1

## 2020-03-16 NOTE — ED Provider Notes (Signed)
[facility]    CSN: [id number] Arrival date & time: 03/16/20  1654      History   Chief Complaint Chief Complaint  Patient presents with  . Leg Pain    HPI Brenda Murray is a 69 y.o. female.   Established [facility] patient visit  Pt c/o 10/10 pain in legs bilat for a few yrs. Pain got worse Friday. Pt states pain is worse when she ambulates or gets up from sitting. Pt is going to physical therapy for her back and legs. Pt arrived in triage room in [facility] with son.    Patient restarted gabapentin today.  Son wary of narcotics.  Patient points to her knees as location of pain, with obvious TKR surgical scars bilaterally.       Past Medical History:  Diagnosis Date  . Anxiety   . Bipolar disorder (HCC)   . Chronic back pain   . History of indigestion   . Hypothyroidism     Patient Active Problem List   Diagnosis Date Noted  . Schizoaffective disorder, bipolar type (HCC) 11/10/2017  . Hoarseness 11/09/2016  . Parotid swelling 03/22/2016  . Nausea and vomiting 11/04/2014  . Hypothyroid 11/04/2014  . Metabolic alkalosis 11/04/2014  . Hypokalemia 11/04/2014  . Knee joint replaced by other means 07/16/2014  . Confusion 07/11/2013  . Depressive disorder, not elsewhere classified 07/11/2013  . Anxiety 07/11/2013  . Vitamin B12 deficiency 06/28/2013  . Gastroesophageal reflux disease without esophagitis 06/28/2013  . Anal pruritus 06/28/2013    Past Surgical History:  Procedure Laterality Date  . carpel tunnel    . ESOPHAGOGASTRODUODENOSCOPY N/A 11/06/2014   Procedure: ESOPHAGOGASTRODUODENOSCOPY (EGD);  Surgeon: Charna Elizabeth, MD;  Location: [facility] ENDOSCOPY;  Service: Endoscopy;  Laterality: N/A;  . JOINT REPLACEMENT     bilat knee  . TONSILLECTOMY    . TOTAL KNEE REVISION  05/01/2012   Procedure: TOTAL KNEE REVISION;  Surgeon: Raymon Mutton, MD;  Location: [facility] OR;  Service: Orthopedics;  Laterality: Right;    OB History   No obstetric history on file.       Home Medications    Prior to Admission medications   Medication Sig Start Date End Date Taking? Authorizing Provider  acetaminophen (TYLENOL) 650 MG CR tablet Take 1,300 mg by mouth every 12 (twelve) hours.    [provider]  antiseptic oral rinse (BIOTENE) LIQD 15 mLs by Mouth Rinse route as needed for dry mouth.    [provider]  Carboxymethylcell-Hypromellose (GENTEAL OP) Place 1 drop into both eyes 2 (two) times daily.    [provider]  DULoxetine (CYMBALTA) 60 MG capsule Take 60 mg by mouth at bedtime.  12/10/16   [provider]  ergocalciferol (VITAMIN D2) 1.25 MG (50000 UT) capsule Take 50,000 Units by mouth once a week.    [provider]  fluticasone (FLONASE) 50 MCG/ACT nasal spray Place 1 spray into both nostrils daily.    [provider]  guaiFENesin (ROBITUSSIN) 100 MG/5ML liquid Take 200 mg by mouth every 4 (four) hours as needed for cough.    [provider]  hydrOXYzine (ATARAX/VISTARIL) 25 MG tablet Take 1 tablet (25 mg total) by mouth every 6 (six) hours. Patient not taking: Reported on 10/16/2018 07/08/17   Bethel Born, PA-C  levothyroxine (SYNTHROID, LEVOTHROID) 100 MCG tablet Take 100 mcg by mouth daily before breakfast.    [provider]  lithium carbonate 150 MG capsule Take 300 mg by mouth  at bedtime.  06/23/17   [provider]  LORazepam (ATIVAN) 1 MG tablet Take 1 mg by mouth at bedtime. 07/23/17   [provider]  Melatonin 5 MG CAPS Take 5 mg by mouth at bedtime.    [provider]  Multiple Vitamins-Minerals (PRESERVISION AREDS 2+MULTI VIT PO) Take 1 capsule by mouth daily.    [provider]  NEURONTIN 300 MG capsule Take 300 mg by mouth 3 (three) times daily as needed. 01/16/20   [provider]  paliperidone (INVEGA SUSTENNA) 156 MG/ML SUSY injection Inject 156 mg into the muscle every 30 (thirty) days.    [provider]   predniSONE (DELTASONE) 10 MG tablet Take 5-10 mg by mouth See admin instructions. 5 mg every morning, 10 mg every night    [provider]  predniSONE (DELTASONE) 20 MG tablet Take 2 tablets (40 mg total) by mouth daily. Patient not taking: Reported on 10/16/2018 10/02/18   Little, Wenda Overland, MD  sodium chloride (OCEAN) 0.65 % SOLN nasal spray Place 1 spray into both nostrils as needed for congestion. Patient not taking: Reported on 10/16/2018 12/06/17   Zigmund Gottron, NP    Family History Family History  Problem Relation Age of Onset  . Breast cancer Neg Hx     Social History Social History   Tobacco Use  . Smoking status: Former Research scientist (life sciences)  . Smokeless tobacco: Never Used  Substance Use Topics  . Alcohol use: No  . Drug use: No     Allergies   Patient has no known allergies.   Review of Systems Review of Systems  Constitutional: Positive for activity change.  Musculoskeletal: Positive for arthralgias, gait problem and myalgias.  All other systems reviewed and are negative.    Physical Exam Triage Vital Signs ED Triage Vitals  Enc Vitals Group     BP 03/16/20 1727 (!) 109/55     Pulse Rate 03/16/20 1727 80     Resp 03/16/20 1727 20     Temp 03/16/20 1727 98.5 F (36.9 C)     Temp Source 03/16/20 1727 Oral     SpO2 03/16/20 1727 100 %     Weight 03/16/20 1731 250 lb (113.4 kg)     Height 03/16/20 1731 5\' 4"  (1.626 m)     Head Circumference --      Peak Flow --      Pain Score 03/16/20 1730 10     Pain Loc --      Pain Edu? --      Excl. in [place]? --    No data found.  Updated Vital Signs BP (!) 109/55   Pulse 80   Temp 98.5 F (36.9 C) (Oral)   Resp 20   Ht 5\' 4"  (1.626 m)   Wt 113.4 kg   SpO2 100%   BMI 42.91 kg/m    Physical Exam Vitals and nursing note reviewed.  Constitutional:      General: She is not in acute distress.    Appearance: She is obese. She is ill-appearing. She is not toxic-appearing.     Comments: Patient  appears morbidly obese and chronically ill  HENT:     Head: Normocephalic.  Eyes:     Conjunctiva/sclera: Conjunctivae normal.  Cardiovascular:     Rate and Rhythm: Normal rate.  Pulmonary:     Effort: Pulmonary effort is normal.  Musculoskeletal:        General: No swelling, tenderness, deformity or signs of injury.  Cervical back: Normal range of motion and neck supple.     Comments: Bilateral TKR surgical scars with no localized swelling, effusions, tenderness or rash.  Patient able to flex knees and lift thighs off edge of wheel chair.  Skin:    General: Skin is warm and dry.  Neurological:     General: No focal deficit present.     Mental Status: She is alert.  Psychiatric:     Comments: Patient appears very depressed.      UC Treatments / Results  Labs (all labs ordered are listed, but only abnormal results are displayed) Labs Reviewed - No data to display  EKG   Radiology No results found.  Procedures Procedures (including critical care time)  Medications Ordered in UC Medications  ketorolac (TORADOL) 30 MG/ML injection 30 mg (30 mg Intramuscular Given 03/16/20 1801)    Initial Impression / Assessment and Plan / UC Course  I have reviewed the triage vital signs and the nursing notes.  Pertinent labs & imaging results that were available during my care of the patient were reviewed by me and considered in my medical decision making (see chart for details).    Final Clinical Impressions(s) / UC Diagnoses   Final diagnoses:  Pain of left lower extremity     Discharge Instructions     Discuss pain management with physical therapist tomorrow    ED Prescriptions    None     I have reviewed the PDMP during this encounter.   Elvina Sidle, MD 03/16/20 [phone]

## 2020-03-16 NOTE — ED Triage Notes (Addendum)
Pt c/o 10/10 pain in legs bilatx a few yrs. Pain got worse Friday. Pt states pain is worse when she ambulates or gets up from sitting. Pt is going to physical therapy for her back and legs. Pt arrived in triage room in [facility]

## 2020-03-16 NOTE — Discharge Instructions (Addendum)
Discuss pain management with physical therapist tomorrow

## 2020-03-17 ENCOUNTER — Emergency Department (HOSPITAL_COMMUNITY)
Admission: EM | Admit: 2020-03-17 | Discharge: 2020-03-18 | Disposition: A | Discharge status: Home / Self Care | Payer: Medicare (Managed Care) | Attending: Emergency Medicine | Admitting: Emergency Medicine

## 2020-03-17 ENCOUNTER — Encounter (HOSPITAL_COMMUNITY): Payer: Self-pay | Admitting: *Deleted

## 2020-03-17 ENCOUNTER — Other Ambulatory Visit: Payer: Self-pay

## 2020-03-17 DIAGNOSIS — G8929 Other chronic pain: Secondary | ICD-10-CM

## 2020-03-17 DIAGNOSIS — M79604 Pain in right leg: Secondary | ICD-10-CM

## 2020-03-17 DIAGNOSIS — M4800 Spinal stenosis, site unspecified: Secondary | ICD-10-CM | POA: Diagnosis not present

## 2020-03-17 DIAGNOSIS — Z87891 Personal history of nicotine dependence: Secondary | ICD-10-CM | POA: Diagnosis not present

## 2020-03-17 DIAGNOSIS — M549 Dorsalgia, unspecified: Secondary | ICD-10-CM

## 2020-03-17 DIAGNOSIS — Z96653 Presence of artificial knee joint, bilateral: Secondary | ICD-10-CM | POA: Diagnosis not present

## 2020-03-17 DIAGNOSIS — E039 Hypothyroidism, unspecified: Secondary | ICD-10-CM | POA: Diagnosis not present

## 2020-03-17 DIAGNOSIS — M545 Low back pain: Secondary | ICD-10-CM | POA: Diagnosis present

## 2020-03-17 NOTE — ED Triage Notes (Signed)
Brought to ED by her son for further eval of BLE weakness and pain. States he's had her to a provider twice in the past week for same. Pt didn't want to go to [facility] today - pt states he knew something was wrong. Called pt's PCP and told to give an extra gabapentin. Pt recently had MRI for this pain. Decrease mobilization over the past week and pts sob afraid pt will fall.

## 2020-03-18 MED ORDER — LIDOCAINE 5 % EX PTCH
1.0000 | MEDICATED_PATCH | CUTANEOUS | Status: DC
  Administered 2020-03-18: 1 via TRANSDERMAL
  Filled 2020-03-18: qty 1

## 2020-03-18 MED ORDER — HYDROCODONE-ACETAMINOPHEN 5-325 MG PO TABS
1.0000 | ORAL_TABLET | Freq: Once | ORAL | Status: AC
  Administered 2020-03-18: 1 via ORAL
  Filled 2020-03-18: qty 1

## 2020-03-18 MED ORDER — METHOCARBAMOL 500 MG PO TABS
500.0000 mg | ORAL_TABLET | Freq: Once | ORAL | Status: AC
  Administered 2020-03-18: 500 mg via ORAL
  Filled 2020-03-18: qty 1

## 2020-03-18 MED ORDER — ACETAMINOPHEN 325 MG PO TABS
650.0000 mg | ORAL_TABLET | Freq: Once | ORAL | Status: AC
  Administered 2020-03-18: 650 mg via ORAL
  Filled 2020-03-18: qty 2

## 2020-03-18 MED ORDER — KETOROLAC TROMETHAMINE 15 MG/ML IJ SOLN
15.0000 mg | Freq: Once | INTRAMUSCULAR | Status: AC
  Administered 2020-03-18: 15 mg via INTRAMUSCULAR
  Filled 2020-03-18: qty 1

## 2020-03-18 NOTE — ED Provider Notes (Signed)
[facility] EMERGENCY DEPARTMENT Provider Note   CSN: [id number] Arrival date & time: 03/17/20  1739     History Chief Complaint  Patient presents with  . Extremity Weakness    Brenda Murray is a 69 y.o. female.  Brenda Murray is a 69 y.o. female with a history of chronic back pain, hypothyroidism, bipolar disorder, who presents to the ED for evaluation of bilateral lower extremity pain and back pain.  Patient was seen for the same at urgent care a few days ago and has been followed by her PCP for this pain.  Had MRI Completed on 3/30 that showed progression of lumbar spondylosis at L3-4 and L4-5 with moderate to severe canal and foraminal stenosis.  Patient states that she is on a pain pill at home but cannot remember the name, and has been going to physical therapy for this, but states that she just wants the pain to go away.  Patient states she has been dealing with this pain for a few years but feels like it has been worse in the last week.  Pain is present in both lower extremities and in her low back.  She denies numbness but feels like the pain makes it hard for her to walk.  States that she occasionally has some slight swelling in her legs and feels like this is present today, but states this is not new or worse than usual.  She denies any new trauma or falls.  No fevers, no abdominal pain.  No difficulty controlling her bowels or bladder.  No saddle anesthesia.        Past Medical History:  Diagnosis Date  . Anxiety   . Bipolar disorder (HCC)   . Chronic back pain   . History of indigestion   . Hypothyroidism     Patient Active Problem List   Diagnosis Date Noted  . Schizoaffective disorder, bipolar type (HCC) 11/10/2017  . Hoarseness 11/09/2016  . Parotid swelling 03/22/2016  . Nausea and vomiting 11/04/2014  . Hypothyroid 11/04/2014  . Metabolic alkalosis 11/04/2014  . Hypokalemia 11/04/2014  . Knee joint replaced by other means 07/16/2014  .  Confusion 07/11/2013  . Depressive disorder, not elsewhere classified 07/11/2013  . Anxiety 07/11/2013  . Vitamin B12 deficiency 06/28/2013  . Gastroesophageal reflux disease without esophagitis 06/28/2013  . Anal pruritus 06/28/2013    Past Surgical History:  Procedure Laterality Date  . carpel tunnel    . ESOPHAGOGASTRODUODENOSCOPY N/A 11/06/2014   Procedure: ESOPHAGOGASTRODUODENOSCOPY (EGD);  Surgeon: Charna Elizabeth, MD;  Location: [facility] ENDOSCOPY;  Service: Endoscopy;  Laterality: N/A;  . JOINT REPLACEMENT     bilat knee  . TONSILLECTOMY    . TOTAL KNEE REVISION  05/01/2012   Procedure: TOTAL KNEE REVISION;  Surgeon: Raymon Mutton, MD;  Location: [facility] OR;  Service: Orthopedics;  Laterality: Right;     OB History   No obstetric history on file.     Family History  Problem Relation Age of Onset  . Breast cancer Neg Hx     Social History   Tobacco Use  . Smoking status: Former Games developer  . Smokeless tobacco: Never Used  Substance Use Topics  . Alcohol use: No  . Drug use: No    Home Medications Prior to Admission medications   Medication Sig Start Date End Date Taking? Authorizing Provider  acetaminophen (TYLENOL) 650 MG CR tablet Take 1,300 mg by mouth every 12 (twelve) hours.    [provider]  antiseptic oral rinse (BIOTENE) LIQD 15 mLs by Mouth Rinse route as needed for dry mouth.    [provider]  Carboxymethylcell-Hypromellose (GENTEAL OP) Place 1 drop into both eyes 2 (two) times daily.    [provider]  DULoxetine (CYMBALTA) 60 MG capsule Take 60 mg by mouth at bedtime.  12/10/16   [provider]  ergocalciferol (VITAMIN D2) 1.25 MG (50000 UT) capsule Take 50,000 Units by mouth once a week.    [provider]  fluticasone (FLONASE) 50 MCG/ACT nasal spray Place 1 spray into both nostrils daily.    [provider]  guaiFENesin (ROBITUSSIN) 100 MG/5ML liquid Take 200 mg by mouth every 4 (four) hours as needed for  cough.    [provider]  hydrOXYzine (ATARAX/VISTARIL) 25 MG tablet Take 1 tablet (25 mg total) by mouth every 6 (six) hours. Patient not taking: Reported on 10/16/2018 07/08/17   Bethel Born, PA-C  levothyroxine (SYNTHROID, LEVOTHROID) 100 MCG tablet Take 100 mcg by mouth daily before breakfast.    [provider]  lithium carbonate 150 MG capsule Take 300 mg by mouth at bedtime.  06/23/17   [provider]  LORazepam (ATIVAN) 1 MG tablet Take 1 mg by mouth at bedtime. 07/23/17   [provider]  Melatonin 5 MG CAPS Take 5 mg by mouth at bedtime.    [provider]  Multiple Vitamins-Minerals (PRESERVISION AREDS 2+MULTI VIT PO) Take 1 capsule by mouth daily.    [provider]  NEURONTIN 300 MG capsule Take 300 mg by mouth 3 (three) times daily as needed. 01/16/20   [provider]  paliperidone (INVEGA SUSTENNA) 156 MG/ML SUSY injection Inject 156 mg into the muscle every 30 (thirty) days.    [provider]  predniSONE (DELTASONE) 10 MG tablet Take 5-10 mg by mouth See admin instructions. 5 mg every morning, 10 mg every night    [provider]  predniSONE (DELTASONE) 20 MG tablet Take 2 tablets (40 mg total) by mouth daily. Patient not taking: Reported on 10/16/2018 10/02/18   Little, Ambrose Finland, MD  sodium chloride (OCEAN) 0.65 % SOLN nasal spray Place 1 spray into both nostrils as needed for congestion. Patient not taking: Reported on 10/16/2018 12/06/17   Georgetta Haber, NP    Allergies    Patient has no known allergies.  Review of Systems   Review of Systems  Constitutional: Negative for chills and fever.  HENT: Negative.   Respiratory: Negative for cough and shortness of breath.   Cardiovascular: Negative for chest pain.  Gastrointestinal: Negative for abdominal pain, constipation, nausea and vomiting.  Genitourinary: Negative for difficulty urinating.  Musculoskeletal: Positive for back pain  and myalgias. Negative for arthralgias and joint swelling.  Neurological: Positive for weakness. Negative for numbness.  All other systems reviewed and are negative.   Physical Exam Updated Vital Signs BP 124/75 (BP Location: Left Arm)   Pulse 82   Temp 99.2 F (37.3 C)   Resp 16   Ht 5\' 4"  (1.626 m)   Wt 113.4 kg   SpO2 99%   BMI 42.91 kg/m   Physical Exam Vitals and nursing note reviewed.  Constitutional:      General: She is not in acute distress.    Appearance: Normal appearance. She is well-developed. She is not ill-appearing or diaphoretic.  HENT:     Head: Normocephalic and atraumatic.  Eyes:     General:  Right eye: No discharge.        Left eye: No discharge.  Cardiovascular:     Rate and Rhythm: Normal rate and regular rhythm.     Heart sounds: Normal heart sounds.  Pulmonary:     Effort: Pulmonary effort is normal. No respiratory distress.     Breath sounds: Normal breath sounds.     Comments: Respirations equal and unlabored, patient able to speak in full sentences, lungs clear to auscultation bilaterally Abdominal:     General: Abdomen is flat. Bowel sounds are normal. There is no distension.     Palpations: Abdomen is soft. There is no mass.     Tenderness: There is no abdominal tenderness. There is no guarding.     Comments: Abdomen soft, nondistended, nontender to palpation in all quadrants without guarding or peritoneal signs  Skin:    General: Skin is warm and dry.  Neurological:     Mental Status: She is alert.     Coordination: Coordination normal.     Comments: Speech is clear, able to follow commands CN III-XII intact Normal strength in upper extremities bilaterally, bilateral lower extremities with 5/5 dorsi and plantar flexion, strength of more proximal leg muscles difficult to assess due to pain.   2+ patellar DTRs bilaterally. Sensation normal to light and sharp touch Moves extremities without ataxia, coordination intact    Psychiatric:        Mood and Affect: Mood normal.        Behavior: Behavior normal.     ED Results / Procedures / Treatments   Labs (all labs ordered are listed, but only abnormal results are displayed) Labs Reviewed - No data to display  EKG None  Radiology No results found.  Procedures Procedures (including critical care time)  Medications Ordered in ED Medications  lidocaine (LIDODERM) 5 % 1 patch (1 patch Transdermal Patch Applied 03/18/20 0919)  HYDROcodone-acetaminophen (NORCO/VICODIN) 5-325 MG per tablet 1 tablet (1 tablet Oral Given 03/18/20 0917)  methocarbamol (ROBAXIN) tablet 500 mg (500 mg Oral Given 03/18/20 0917)  ketorolac (TORADOL) 15 MG/ML injection 15 mg (15 mg Intramuscular Given 03/18/20 0916)  acetaminophen (TYLENOL) tablet 650 mg (650 mg Oral Given 03/18/20 6962)    ED Course  I have reviewed the triage vital signs and the nursing notes.  Pertinent labs & imaging results that were available during my care of the patient were reviewed by me and considered in my medical decision making (see chart for details).    MDM Rules/Calculators/A&P                     69 year old female with known chronic back pain and spinal stenosis presenting with pain in her back and bilateral lower extremities.  She is currently followed by her PCP who is managing pain and patient has started physical therapy.  Had recent MRI that showed some progressive worsening of canal stenosis.  She does not have any loss of bowel or bladder control or saddle anesthesia.  No fevers or abdominal pain and she is overall well-appearing.  Will focus on pain control and then reassess patient strength and mobility.  After treatment with pain medication pain is improved and patient is able to ambulate with slow steady gait with her walker, which is her baseline at home.  She has a follow-up appointment with her primary care doctor tomorrow, this is the doctor that has been primarily managing patient's  pain and treatment of her chronic back pain.  During recent previous ED visit EDP discussed with patient's primary care provider who was okay with an acute dose of narcotics to improve pain but requested that the patient not be prescribe narcotics on an outpatient basis.  I have encouraged her to follow-up with her and discussed further pain management options tomorrow.  At this time there does not appear to be any evidence of an acute emergency medical condition and the patient appears stable for discharge with appropriate outpatient follow up.Diagnosis was discussed with patient who verbalizes understanding and is agreeable to discharge. Pt case discussed with Dr. Lynelle Doctor who agrees with my plan.    Final Clinical Impression(s) / ED Diagnoses Final diagnoses:  Exacerbation of chronic back pain  Bilateral leg pain  Spinal stenosis, unspecified spinal region    Rx / DC Orders ED Discharge Orders    None       Legrand Rams 03/18/20 1126    Linwood Dibbles, MD 03/18/20 [phone]

## 2020-03-18 NOTE — Discharge Instructions (Addendum)
Please continue treating your back pain as discussed by your primary care doctor and discuss other pain treatment options with your doctor tomorrow.  Continue to use lidocaine patches, alternate ice and heat and continue with physical therapy as scheduled.

## 2020-03-18 NOTE — ED Notes (Signed)
This RN assisted pt with ambulation. Pt used walker and had steady but slow gait. Pt endoreces pain while moving but states, "it is better but I feel like something is just not right. " Pt ambulated back to bed and was able to get into bed unassisted. Rails up and pt is comfortable.

## 2020-05-30 ENCOUNTER — Emergency Department (HOSPITAL_COMMUNITY)
Admission: EM | Admit: 2020-05-30 | Discharge: 2020-05-30 | Disposition: A | Discharge status: Home / Self Care | Payer: Medicare (Managed Care) | Attending: Emergency Medicine | Admitting: Emergency Medicine

## 2020-05-30 ENCOUNTER — Emergency Department (HOSPITAL_COMMUNITY): Payer: Medicare (Managed Care)

## 2020-05-30 ENCOUNTER — Other Ambulatory Visit: Payer: Self-pay

## 2020-05-30 DIAGNOSIS — G8929 Other chronic pain: Secondary | ICD-10-CM | POA: Diagnosis not present

## 2020-05-30 DIAGNOSIS — M79605 Pain in left leg: Secondary | ICD-10-CM | POA: Insufficient documentation

## 2020-05-30 DIAGNOSIS — Z79899 Other long term (current) drug therapy: Secondary | ICD-10-CM | POA: Diagnosis not present

## 2020-05-30 DIAGNOSIS — R0609 Other forms of dyspnea: Secondary | ICD-10-CM | POA: Diagnosis not present

## 2020-05-30 DIAGNOSIS — R0602 Shortness of breath: Secondary | ICD-10-CM | POA: Insufficient documentation

## 2020-05-30 DIAGNOSIS — Z87891 Personal history of nicotine dependence: Secondary | ICD-10-CM | POA: Insufficient documentation

## 2020-05-30 LAB — CBC WITH DIFFERENTIAL/PLATELET
Abs Immature Granulocytes: 0.06 10*3/uL (ref 0.00–0.07)
Basophils Absolute: 0 10*3/uL (ref 0.0–0.1)
Basophils Relative: 0 %
Eosinophils Absolute: 0.3 10*3/uL (ref 0.0–0.5)
Eosinophils Relative: 5 %
HCT: 35.4 % — ABNORMAL LOW (ref 36.0–46.0)
Hemoglobin: 10.6 g/dL — ABNORMAL LOW (ref 12.0–15.0)
Immature Granulocytes: 1 %
Lymphocytes Relative: 17 %
Lymphs Abs: 0.9 10*3/uL (ref 0.7–4.0)
MCH: 26.3 pg (ref 26.0–34.0)
MCHC: 29.9 g/dL — ABNORMAL LOW (ref 30.0–36.0)
MCV: 87.8 fL (ref 80.0–100.0)
Monocytes Absolute: 0.6 10*3/uL (ref 0.1–1.0)
Monocytes Relative: 11 %
Neutro Abs: 3.5 10*3/uL (ref 1.7–7.7)
Neutrophils Relative %: 66 %
Platelets: 285 10*3/uL (ref 150–400)
RBC: 4.03 MIL/uL (ref 3.87–5.11)
RDW: 16 % — ABNORMAL HIGH (ref 11.5–15.5)
WBC: 5.2 10*3/uL (ref 4.0–10.5)
nRBC: 0 % (ref 0.0–0.2)

## 2020-05-30 LAB — BRAIN NATRIURETIC PEPTIDE: B Natriuretic Peptide: 26.9 pg/mL (ref 0.0–100.0)

## 2020-05-30 LAB — BASIC METABOLIC PANEL
Anion gap: 8 (ref 5–15)
BUN: 16 mg/dL (ref 8–23)
CO2: 34 mmol/L — ABNORMAL HIGH (ref 22–32)
Calcium: 10.2 mg/dL (ref 8.9–10.3)
Chloride: 97 mmol/L — ABNORMAL LOW (ref 98–111)
Creatinine, Ser: 0.81 mg/dL (ref 0.44–1.00)
GFR calc Af Amer: >60 mL/min (ref >60)
GFR calc non Af Amer: >60 mL/min (ref >60)
Glucose, Bld: 80 mg/dL (ref 70–99)
Potassium: 4.7 mmol/L (ref 3.5–5.1)
Sodium: 139 mmol/L (ref 135–145)

## 2020-05-30 MED ORDER — KETOROLAC TROMETHAMINE 30 MG/ML IJ SOLN
30.0000 mg | Freq: Once | INTRAMUSCULAR | Status: AC
  Administered 2020-05-30: 30 mg via INTRAMUSCULAR
  Filled 2020-05-30: qty 1

## 2020-05-30 NOTE — ED Provider Notes (Signed)
[facility] EMERGENCY DEPARTMENT Provider Note   CSN: [id number] Arrival date & time: 05/30/20  1005     History No chief complaint on file.   Brenda Murray is a 69 y.o. female with past medical history of schizoaffective disorder, bipolar type, chronic back pain, chronic left leg pain, anxiety presents to the ED for evaluation of pain in her left leg.  Localized in the front of the left knee with radiation to the left foot.  This is chronic but it has worsened in the last 24 hours, especially worse last night.  Mild pain when just sitting down or laying down but severe when she tries to move around in bed, put weight on it.  Pain is worse when moving around her bed, trying to walk.  She takes oxycodone for the pain but states this has not helped.  Denies any recent fall, trauma, joint swelling.  Denies distal extremity tingling, loss of sensation or coolness.  Patient accompanied by son who states she lives in an independent living facility and she called him several times in the middle of the night due to pain.  Son states she sounded short of breath.  Son isn't sure if shortness of breath is from panic attacks or from the pain or from her lungs.  She takes ativan for panic attacks.  Patient states the pain makes her anxious and then she can't breathe.  [facility] for leg pain and shortness of breath.  She was given a fluid pill to take daily.  She has taken this twice and has noticed improvement in abdomen swelling and leg swelling. she has had a lot more urination.  She states sometimes she is a little winded when moving around too but this is not new.  Son states usually patient receives an "anti-inflammatory" shot that helps her leg pain.  Denies chest pain. No history of blood clot. No hemoptysis.  No recent surgery, prolonged immobilization.  No history of CHF. Patient seems adamant that her pain takes her breath away but son wants to  make sure "it's not her lungs".  She denies chest pain. No fever, cough.   HPI     Past Medical History:  Diagnosis Date  . Anxiety   . Bipolar disorder (HCC)   . Chronic back pain   . History of indigestion   . Hypothyroidism     Patient Active Problem List   Diagnosis Date Noted  . Schizoaffective disorder, bipolar type (HCC) 11/10/2017  . Hoarseness 11/09/2016  . Parotid swelling 03/22/2016  . Nausea and vomiting 11/04/2014  . Hypothyroid 11/04/2014  . Metabolic alkalosis 11/04/2014  . Hypokalemia 11/04/2014  . Knee joint replaced by other means 07/16/2014  . Confusion 07/11/2013  . Depressive disorder, not elsewhere classified 07/11/2013  . Anxiety 07/11/2013  . Vitamin B12 deficiency 06/28/2013  . Gastroesophageal reflux disease without esophagitis 06/28/2013  . Anal pruritus 06/28/2013    Past Surgical History:  Procedure Laterality Date  . carpel tunnel    . ESOPHAGOGASTRODUODENOSCOPY N/A 11/06/2014   Procedure: ESOPHAGOGASTRODUODENOSCOPY (EGD);  Surgeon: Charna Elizabeth, MD;  Location: [facility] ENDOSCOPY;  Service: Endoscopy;  Laterality: N/A;  . JOINT REPLACEMENT     bilat knee  . TONSILLECTOMY    . TOTAL KNEE REVISION  05/01/2012   Procedure: TOTAL KNEE REVISION;  Surgeon: Raymon Mutton, MD;  Location: [facility] OR;  Service: Orthopedics;  Laterality: Right;     OB History  No obstetric history on file.     Family History  Problem Relation Age of Onset  . Breast cancer Neg Hx     Social History   Tobacco Use  . Smoking status: Former Games developer  . Smokeless tobacco: Never Used  Vaping Use  . Vaping Use: Never used  Substance Use Topics  . Alcohol use: No  . Drug use: No    Home Medications Prior to Admission medications   Medication Sig Start Date End Date Taking? Authorizing Provider  acetaminophen (TYLENOL) 650 MG CR tablet Take 1,300 mg by mouth every 12 (twelve) hours.   Yes [provider]  antiseptic oral rinse (BIOTENE) LIQD 15 mLs by  Mouth Rinse route as needed for dry mouth.   Yes [provider]  Carboxymethylcell-Hypromellose (GENTEAL OP) Place 1 drop into both eyes 2 (two) times daily.   Yes [provider]  DULoxetine (CYMBALTA) 60 MG capsule Take 60 mg by mouth at bedtime.  12/10/16  Yes [provider]  ergocalciferol (VITAMIN D2) 1.25 MG (50000 UT) capsule Take 50,000 Units by mouth once a week.   Yes [provider]  fluticasone (FLONASE) 50 MCG/ACT nasal spray Place 1 spray into both nostrils daily.   Yes [provider]  guaiFENesin (ROBITUSSIN) 100 MG/5ML liquid Take 200 mg by mouth every 4 (four) hours as needed for cough.   Yes [provider]  levothyroxine (SYNTHROID, LEVOTHROID) 100 MCG tablet Take 100 mcg by mouth daily before breakfast.   Yes [provider]  lithium carbonate 150 MG capsule Take 300 mg by mouth at bedtime.  06/23/17  Yes [provider]  LORazepam (ATIVAN) 1 MG tablet Take 1 mg by mouth at bedtime. 07/23/17  Yes [provider]  Melatonin 5 MG CAPS Take 5 mg by mouth at bedtime.   Yes [provider]  Multiple Vitamins-Minerals (PRESERVISION AREDS 2+MULTI VIT PO) Take 1 capsule by mouth daily.   Yes [provider]  NEURONTIN 300 MG capsule Take 300 mg by mouth 3 (three) times daily as needed. 01/16/20  Yes [provider]  sodium chloride (OCEAN) 0.65 % SOLN nasal spray Place 1 spray into both nostrils as needed for congestion. 12/06/17  Yes Linus Mako B, NP  hydrOXYzine (ATARAX/VISTARIL) 25 MG tablet Take 1 tablet (25 mg total) by mouth every 6 (six) hours. Patient not taking: Reported on 10/16/2018 07/08/17   Bethel Born, PA-C  predniSONE (DELTASONE) 20 MG tablet Take 2 tablets (40 mg total) by mouth daily. Patient not taking: Reported on 10/16/2018 10/02/18   Little, Ambrose Finland, MD    Allergies    Patient has no known allergies.  Review of Systems   Review of Systems    Respiratory: Positive for shortness of breath.   Musculoskeletal: Positive for arthralgias (leg pain).  All other systems reviewed and are negative.   Physical Exam Updated Vital Signs BP 120/69 (BP Location: Left Arm)   Pulse 83   Temp 98.3 F (36.8 C) (Oral)   Resp 18   Ht 5\' 4"  (1.626 m)   Wt 99.8 kg   SpO2 96%   BMI 37.76 kg/m   Physical Exam Vitals and nursing note reviewed.  Constitutional:      General: She is not in acute distress.    Appearance: She is well-developed.     Comments: NAD.  HENT:     Head: Normocephalic and atraumatic.     Right Ear: External ear normal.  Left Ear: External ear normal.     Nose: Nose normal.  Eyes:     General: No scleral icterus.    Conjunctiva/sclera: Conjunctivae normal.  Cardiovascular:     Rate and Rhythm: Normal rate and regular rhythm.     Heart sounds: Normal heart sounds.     Comments: Trace pitting edema to pretibial, bilaterally. No calf tenderness. 1+ DP pulses bilaterally.  Pulmonary:     Effort: Pulmonary effort is normal.     Breath sounds: Normal breath sounds.     Comments: Normal work of breathing.  Patient ambulated here with normal SPO2.  Decreased lung sounds anteriorly and lower lobes, difficult exam due due to body habitus.  Lungs clear otherwise without rales, wheezing. Musculoskeletal:        General: No deformity. Normal range of motion.     Cervical back: Normal range of motion and neck supple.     Comments: Mild reproducible pain in anterior infrapatellar space, reproduced with passive flexion/extension of knee.  Normal J tracking of patella, no crepitus with ROM. No joint edema, erythema, effusion, warmth.  No posterior leg, popliteal or calf tenderness.  Patient able to stand up from the chair and ambulate with mild antalgia.  Skin:    General: Skin is warm and dry.     Capillary Refill: Capillary refill takes less than 2 seconds.  Neurological:     Mental Status: She is alert and oriented to  person, place, and time.     Comments: Sensation and strength in the lower extremities is symmetric and intact bilaterally.  Patient able to ambulate, reports some pain in the left leg with movement.  No foot drag.  Psychiatric:        Behavior: Behavior normal.        Thought Content: Thought content normal.        Judgment: Judgment normal.     ED Results / Procedures / Treatments   Labs (all labs ordered are listed, but only abnormal results are displayed) Labs Reviewed  CBC WITH DIFFERENTIAL/PLATELET - Abnormal; Notable for the following components:      Result Value   Hemoglobin 10.6 (*)    HCT 35.4 (*)    MCHC 29.9 (*)    RDW 16.0 (*)    All other components within normal limits  BASIC METABOLIC PANEL - Abnormal; Notable for the following components:   Chloride 97 (*)    CO2 34 (*)    All other components within normal limits  BRAIN NATRIURETIC PEPTIDE    EKG None  Radiology DG Chest 2 View  Result Date: 05/30/2020 CLINICAL DATA:  Dyspnea, lower extremity edema EXAM: CHEST - 2 VIEW COMPARISON:  02/23/2020 FINDINGS: The lungs are symmetrically expanded and are clear. No pneumothorax or pleural effusion. Cardiac size within normal limits. The central pulmonary arteries are mildly enlarged suggesting changes of pulmonary arterial hypertension, unchanged. No acute bone abnormality IMPRESSION: No active cardiopulmonary disease. Electronically Signed   By: Helyn Numbers MD   On: 05/30/2020 14:18    Procedures Procedures (including critical care time)  Medications Ordered in ED Medications  ketorolac (TORADOL) 30 MG/ML injection 30 mg (30 mg Intramuscular Given 05/30/20 1400)    ED Course  I have reviewed the triage vital signs and the nursing notes.  Pertinent labs & imaging results that were available during my care of the patient were reviewed by me and considered in my medical decision making (see chart for details).  Clinical Course as  of May 30 1721  Fri May 30, 2020  1343 WBC: 5.2 [CG]  1543 Hemoglobin(!): 10.6 [CG]  1543 B Natriuretic Peptide: 26.9 [CG]  1543 The lungs are symmetrically expanded and are clear. No pneumothorax or pleural effusion. Cardiac size within normal limits. The central pulmonary arteries are mildly enlarged suggesting changes of pulmonary arterial hypertension, unchanged. No acute bone abnormality  DG Chest 2 View [CG]  1543 Reviewed by EDP Zammit, non acute   ED EKG [CG]    Clinical Course User Index [CG] Liberty HandyGibbons, Jak Haggar J, PA-C   MDM Rules/Calculators/A&P                          69 year old female presents for acute on chronic left lower leg pain.  Seen in the ED in the past for similar.  They are requesting a "anti-inflammatory" shot that has worked in the past.  No trauma.  Son at bedside states patient called him several times at night due to the pain and she sounded short of breath.  Patient states she is chronically dyspneic with activity but this is unchanged.  Son wants to make sure that the shortness of breath is not related to a lung issue.  Patient states it is from the pain and "panic attack".  No red flags like fever, cough, chest pain.  Reports some leg swelling and abdominal swelling that has improved after initiation of diuretics by PCP.  No trauma.  No signs of infection in the left lower extremity.  No calf tenderness.  Only trace pitting edema pretibially bilaterally. No neuro or pulse deficits.  Ordered lab work including BNP.  Ordered chest x-ray.  Will give Toradol IM given previous report of response to this in the past.  Patient ambulated here with normal SPO2.  Her pain improved after Toradol.  ER work-up personally visualized and interpreted.  EKG without signs of ischemia, arrhythmias.  Chest x-ray without edema.  BNP is normal.  Stable anemia. Labs non acute.  Patient has no chest pain and I do not think this presentation is consistent with ACS.  I do not think troponins are necessary.   She has no pleuritic chest pain, tachycardia, hypoxia, tachypnea and otherwise no risk factors for PE.  I doubt PE.  We will discharge patient with PCP follow-up.  Discussed results with patient and son, no evidence of pulmonary edema, heart failure.  Doubt PE or infectious process.  Unclear cause of her dyspnea but sounds chronic.  Patient to follow-up with PCP peer return precautions discussed.  They are comfortable with the plan. Final Clinical Impression(s) / ED Diagnoses Final diagnoses:  Chronic pain of left lower extremity  Dyspnea on effort    Rx / DC Orders ED Discharge Orders    None       Jerrell MylarGibbons, Dalyla Chui J, PA-C 05/30/20 1722    Bethann BerkshireZammit, Joseph, MD 06/02/20 1005

## 2020-05-30 NOTE — Discharge Instructions (Addendum)
You were seen in the ED for left leg pain and shortness of breath  You received a shot of Toradol  Given your report of shortness of breath, we obtained lab work, chest x-rays  There is no signs of fluid on your lungs.  No signs of heart failure crisis.  [facility] the next week for further management of your shortness of breath  Return to the ED if symptoms worsen, you have chest pain or shortness of breath with exertion noticed worse than usual, fever, cough, sudden or worsening leg swelling or new calf pain

## 2020-05-30 NOTE — ED Triage Notes (Signed)
Pt here from home with c/o left chronic knee pain , pt has been seen for same over the ast few month s, sons states that she got an shot once that helped he thinks Toradol

## 2020-05-30 NOTE — ED Notes (Signed)
Pt. Complained of left leg pain when standing piror to ambulation O2 was 100 and pulse was 91. After ambulation O2 was 93 and pulse was 96.

## 2020-07-17 ENCOUNTER — Encounter (HOSPITAL_COMMUNITY): Payer: Self-pay

## 2020-07-17 ENCOUNTER — Ambulatory Visit (HOSPITAL_COMMUNITY)
Admission: EM | Admit: 2020-07-17 | Discharge: 2020-07-17 | Disposition: A | Discharge status: Home / Self Care | Payer: Medicare (Managed Care) | Attending: Family Medicine | Admitting: Family Medicine

## 2020-07-17 ENCOUNTER — Other Ambulatory Visit: Payer: Self-pay

## 2020-07-17 DIAGNOSIS — M7989 Other specified soft tissue disorders: Secondary | ICD-10-CM

## 2020-07-17 DIAGNOSIS — L03116 Cellulitis of left lower limb: Secondary | ICD-10-CM

## 2020-07-17 MED ORDER — DOXYCYCLINE HYCLATE 100 MG PO CAPS
100.0000 mg | ORAL_CAPSULE | Freq: Two times a day (BID) | ORAL | 0 refills | Status: DC
Start: 2020-07-17 — End: 2020-07-21

## 2020-07-17 NOTE — ED Triage Notes (Signed)
Pt present both feet are swollen, symptoms started about a week ago. Pt is having difficulty walking.

## 2020-07-17 NOTE — Discharge Instructions (Addendum)
Continue the Lasix daily until finished.  Start taking the doxycycline twice a day for 7 days Rest and elevate the legs. You can take your chronic pain medication as needed

## 2020-07-17 NOTE — ED Provider Notes (Addendum)
[facility]    CSN: [id number] Arrival date & time: 07/17/20  0086      History   Chief Complaint Chief Complaint  Patient presents with  . Foot Swelling    Both    HPI Brenda Murray is a 69 y.o. female.   Patient is a 69 year old female presents today with bilateral foot swelling more on left with pain to left lower extremity.  This started about a week ago and is worsened.  Trouble walking.   Was prescribed Lasix for swelling and has taken second dose of the 5 days dose this morning.  Feels improvement in the right lower extremity but the left lower extremity is more swollen, erythematous and very tender to touch.  She has been taking her chronic pain medication without much relief.  Denies any injuries to the leg or foot.  Does have past medical history of anxiety, bipolar, chronic back pain, hypothyroidism.      Past Medical History:  Diagnosis Date  . Anxiety   . Bipolar disorder (HCC)   . Chronic back pain   . History of indigestion   . Hypothyroidism     Patient Active Problem List   Diagnosis Date Noted  . Schizoaffective disorder, bipolar type (HCC) 11/10/2017  . Hoarseness 11/09/2016  . Parotid swelling 03/22/2016  . Nausea and vomiting 11/04/2014  . Hypothyroid 11/04/2014  . Metabolic alkalosis 11/04/2014  . Hypokalemia 11/04/2014  . Knee joint replaced by other means 07/16/2014  . Confusion 07/11/2013  . Depressive disorder, not elsewhere classified 07/11/2013  . Anxiety 07/11/2013  . Vitamin B12 deficiency 06/28/2013  . Gastroesophageal reflux disease without esophagitis 06/28/2013  . Anal pruritus 06/28/2013    Past Surgical History:  Procedure Laterality Date  . carpel tunnel    . ESOPHAGOGASTRODUODENOSCOPY N/A 11/06/2014   Procedure: ESOPHAGOGASTRODUODENOSCOPY (EGD);  Surgeon: Charna Elizabeth, MD;  Location: [facility] ENDOSCOPY;  Service: Endoscopy;  Laterality: N/A;  . JOINT REPLACEMENT     bilat knee  . TONSILLECTOMY    . TOTAL KNEE  REVISION  05/01/2012   Procedure: TOTAL KNEE REVISION;  Surgeon: Raymon Mutton, MD;  Location: [facility] OR;  Service: Orthopedics;  Laterality: Right;    OB History   No obstetric history on file.      Home Medications    Prior to Admission medications   Medication Sig Start Date End Date Taking? Authorizing Provider  acetaminophen (TYLENOL) 650 MG CR tablet Take 1,300 mg by mouth every 12 (twelve) hours.    [provider]  antiseptic oral rinse (BIOTENE) LIQD 15 mLs by Mouth Rinse route as needed for dry mouth.    [provider]  Carboxymethylcell-Hypromellose (GENTEAL OP) Place 1 drop into both eyes 2 (two) times daily.    [provider]  doxycycline (VIBRAMYCIN) 100 MG capsule Take 1 capsule (100 mg total) by mouth 2 (two) times daily for 7 days. 07/17/20 07/24/20  Dahlia Byes A, NP  DULoxetine (CYMBALTA) 60 MG capsule Take 60 mg by mouth at bedtime.  12/10/16   [provider]  ergocalciferol (VITAMIN D2) 1.25 MG (50000 UT) capsule Take 50,000 Units by mouth once a week.    [provider]  fluticasone (FLONASE) 50 MCG/ACT nasal spray Place 1 spray into both nostrils daily.    [provider]  guaiFENesin (ROBITUSSIN) 100 MG/5ML liquid Take 200 mg by mouth every 4 (four) hours as needed for cough.    [provider]  levothyroxine (SYNTHROID, LEVOTHROID) 100 MCG  tablet Take 100 mcg by mouth daily before breakfast.    [provider]  lithium carbonate 150 MG capsule Take 300 mg by mouth at bedtime.  06/23/17   [provider]  LORazepam (ATIVAN) 1 MG tablet Take 1 mg by mouth at bedtime. 07/23/17   [provider]  Melatonin 5 MG CAPS Take 5 mg by mouth at bedtime.    [provider]  Multiple Vitamins-Minerals (PRESERVISION AREDS 2+MULTI VIT PO) Take 1 capsule by mouth daily.    [provider]  NEURONTIN 300 MG capsule Take 300 mg by mouth 3 (three) times daily as needed. 01/16/20    [provider]  sodium chloride (OCEAN) 0.65 % SOLN nasal spray Place 1 spray into both nostrils as needed for congestion. 12/06/17   Georgetta Haber, NP    Family History Family History  Problem Relation Age of Onset  . Breast cancer Neg Hx     Social History Social History   Tobacco Use  . Smoking status: Former Games developer  . Smokeless tobacco: Never Used  Vaping Use  . Vaping Use: Never used  Substance Use Topics  . Alcohol use: No  . Drug use: No     Allergies   Patient has no known allergies.   Review of Systems Review of Systems   Physical Exam Triage Vital Signs ED Triage Vitals  Enc Vitals Group     BP 07/17/20 1124 119/71     Pulse Rate 07/17/20 1124 68     Resp 07/17/20 1124 18     Temp 07/17/20 1124 98.2 F (36.8 C)     Temp Source 07/17/20 1124 Oral     SpO2 07/17/20 1124 99 %     Weight --      Height --      Head Circumference --      Peak Flow --      Pain Score 07/17/20 1125 5     Pain Loc --      Pain Edu? --      Excl. in GC? --    No data found.  Updated Vital Signs BP 119/71 (BP Location: Left Arm)   Pulse 68   Temp 98.2 F (36.8 C) (Oral)   Resp 18   SpO2 99%   Visual Acuity Right Eye Distance:   Left Eye Distance:   Bilateral Distance:    Right Eye Near:   Left Eye Near:    Bilateral Near:     Physical Exam Vitals and nursing note reviewed.  Constitutional:      General: She is not in acute distress.    Appearance: Normal appearance. She is not ill-appearing, toxic-appearing or diaphoretic.  HENT:     Head: Normocephalic.     Nose: Nose normal.  Eyes:     Conjunctiva/sclera: Conjunctivae normal.  Pulmonary:     Effort: Pulmonary effort is normal.  Musculoskeletal:        General: Normal range of motion.     Cervical back: Normal range of motion.     Right lower leg: Edema present.     Left lower leg: Edema present.     Comments: 1+ edema to right lower extremity.  No redness, increased warmth.  2+  pedal pulse. 2+ edema to left lower extremity with generalized erythema starting mid calf extending down to foot.  Very tender to touch of entire area.  No specific calf swelling or pain. 1+ pedal pulse  No open wounds  Skin:    General: Skin is warm and dry.     Findings: No rash.  Neurological:     Mental Status: She is alert.  Psychiatric:        Mood and Affect: Mood normal.      UC Treatments / Results  Labs (all labs ordered are listed, but only abnormal results are displayed) Labs Reviewed - No data to display  EKG   Radiology No results found.  Procedures Procedures (including critical care time)  Medications Ordered in UC Medications - No data to display  Initial Impression / Assessment and Plan / UC Course  I have reviewed the triage vital signs and the nursing notes.  Pertinent labs & imaging results that were available during my care of the patient were reviewed by me and considered in my medical decision making (see chart for details).     Swelling of lower extremity specifically worse to left lower extremity.  We will have her continue Lasix that was prescribed for this. Also believe she has cellulitis to left lower extremity based on the generalized erythema, increased warmth and tenderness. The right lower extremity is normal in color and less swollen We will go ahead and treat with doxycycline. Recommend rest, elevate Pain medication as needed Follow up as needed for continued or worsening symptoms  Final Clinical Impressions(s) / UC Diagnoses   Final diagnoses:  Swelling of lower extremity  Cellulitis of leg, left     Discharge Instructions     Continue the Lasix daily until finished.  Start taking the doxycycline twice a day for 7 days Rest and elevate the legs. You can take your chronic pain medication as needed    ED Prescriptions    Medication Sig Dispense Auth. Provider   doxycycline (VIBRAMYCIN) 100 MG capsule Take 1 capsule  (100 mg total) by mouth 2 (two) times daily for 7 days. 14 capsule Eliza Grissinger A, NP     PDMP not reviewed this encounter.   Janace Aris, NP 07/17/20 1302    Dahlia Byes A, NP 07/17/20 1304

## 2020-07-19 ENCOUNTER — Emergency Department (HOSPITAL_COMMUNITY): Payer: Medicare (Managed Care)

## 2020-07-19 ENCOUNTER — Encounter (HOSPITAL_COMMUNITY): Payer: Self-pay

## 2020-07-19 ENCOUNTER — Other Ambulatory Visit: Payer: Self-pay

## 2020-07-19 ENCOUNTER — Ambulatory Visit (HOSPITAL_COMMUNITY)
Admission: EM | Admit: 2020-07-19 | Discharge: 2020-07-19 | Disposition: A | Discharge status: Home / Self Care | Payer: Medicare (Managed Care)

## 2020-07-19 ENCOUNTER — Inpatient Hospital Stay (HOSPITAL_COMMUNITY)
Admission: EM | Admit: 2020-07-19 | Discharge: 2020-07-21 | DRG: 603 | Disposition: A | Discharge status: Home Health | Payer: Medicare (Managed Care) | Attending: Internal Medicine | Admitting: Internal Medicine

## 2020-07-19 DIAGNOSIS — Z79890 Hormone replacement therapy (postmenopausal): Secondary | ICD-10-CM

## 2020-07-19 DIAGNOSIS — K219 Gastro-esophageal reflux disease without esophagitis: Secondary | ICD-10-CM | POA: Diagnosis present

## 2020-07-19 DIAGNOSIS — L0390 Cellulitis, unspecified: Secondary | ICD-10-CM | POA: Diagnosis not present

## 2020-07-19 DIAGNOSIS — Z20822 Contact with and (suspected) exposure to covid-19: Secondary | ICD-10-CM | POA: Diagnosis present

## 2020-07-19 DIAGNOSIS — G8929 Other chronic pain: Secondary | ICD-10-CM | POA: Diagnosis present

## 2020-07-19 DIAGNOSIS — R609 Edema, unspecified: Secondary | ICD-10-CM

## 2020-07-19 DIAGNOSIS — L03119 Cellulitis of unspecified part of limb: Secondary | ICD-10-CM | POA: Diagnosis present

## 2020-07-19 DIAGNOSIS — Z6837 Body mass index (BMI) 37.0-37.9, adult: Secondary | ICD-10-CM

## 2020-07-19 DIAGNOSIS — E669 Obesity, unspecified: Secondary | ICD-10-CM | POA: Diagnosis present

## 2020-07-19 DIAGNOSIS — L03116 Cellulitis of left lower limb: Secondary | ICD-10-CM | POA: Diagnosis not present

## 2020-07-19 DIAGNOSIS — M7989 Other specified soft tissue disorders: Secondary | ICD-10-CM

## 2020-07-19 DIAGNOSIS — Z79899 Other long term (current) drug therapy: Secondary | ICD-10-CM

## 2020-07-19 DIAGNOSIS — E039 Hypothyroidism, unspecified: Secondary | ICD-10-CM

## 2020-07-19 DIAGNOSIS — D649 Anemia, unspecified: Secondary | ICD-10-CM | POA: Diagnosis present

## 2020-07-19 DIAGNOSIS — Z96651 Presence of right artificial knee joint: Secondary | ICD-10-CM | POA: Diagnosis present

## 2020-07-19 DIAGNOSIS — L03115 Cellulitis of right lower limb: Secondary | ICD-10-CM | POA: Diagnosis present

## 2020-07-19 DIAGNOSIS — F419 Anxiety disorder, unspecified: Secondary | ICD-10-CM | POA: Diagnosis present

## 2020-07-19 DIAGNOSIS — Z87891 Personal history of nicotine dependence: Secondary | ICD-10-CM

## 2020-07-19 DIAGNOSIS — E538 Deficiency of other specified B group vitamins: Secondary | ICD-10-CM | POA: Diagnosis present

## 2020-07-19 DIAGNOSIS — M48061 Spinal stenosis, lumbar region without neurogenic claudication: Secondary | ICD-10-CM

## 2020-07-19 DIAGNOSIS — F250 Schizoaffective disorder, bipolar type: Secondary | ICD-10-CM | POA: Diagnosis present

## 2020-07-19 LAB — COMPREHENSIVE METABOLIC PANEL
ALT: 30 U/L (ref 0–44)
AST: 33 U/L (ref 15–41)
Albumin: 3.7 g/dL (ref 3.5–5.0)
Alkaline Phosphatase: 57 U/L (ref 38–126)
Anion gap: 6 (ref 5–15)
BUN: 30 mg/dL — ABNORMAL HIGH (ref 8–23)
CO2: 30 mmol/L (ref 22–32)
Calcium: 9.9 mg/dL (ref 8.9–10.3)
Chloride: 102 mmol/L (ref 98–111)
Creatinine, Ser: 0.94 mg/dL (ref 0.44–1.00)
GFR calc Af Amer: >60 mL/min (ref >60)
GFR calc non Af Amer: >60 mL/min (ref >60)
Glucose, Bld: 90 mg/dL (ref 70–99)
Potassium: 4.4 mmol/L (ref 3.5–5.1)
Sodium: 138 mmol/L (ref 135–145)
Total Bilirubin: 0.6 mg/dL (ref 0.3–1.2)
Total Protein: 7.6 g/dL (ref 6.5–8.1)

## 2020-07-19 LAB — URINALYSIS, ROUTINE W REFLEX MICROSCOPIC
Bilirubin Urine: NEGATIVE
Glucose, UA: NEGATIVE mg/dL
Hgb urine dipstick: NEGATIVE
Ketones, ur: NEGATIVE mg/dL
Leukocytes,Ua: NEGATIVE
Nitrite: NEGATIVE
Protein, ur: NEGATIVE mg/dL
Specific Gravity, Urine: 1.015 (ref 1.005–1.030)
pH: 5 (ref 5.0–8.0)

## 2020-07-19 LAB — CBC WITH DIFFERENTIAL/PLATELET
Abs Immature Granulocytes: 0.03 10*3/uL (ref 0.00–0.07)
Basophils Absolute: 0 10*3/uL (ref 0.0–0.1)
Basophils Relative: 0 %
Eosinophils Absolute: 0.3 10*3/uL (ref 0.0–0.5)
Eosinophils Relative: 6 %
HCT: 33 % — ABNORMAL LOW (ref 36.0–46.0)
Hemoglobin: 9.8 g/dL — ABNORMAL LOW (ref 12.0–15.0)
Immature Granulocytes: 1 %
Lymphocytes Relative: 19 %
Lymphs Abs: 0.9 10*3/uL (ref 0.7–4.0)
MCH: 26.6 pg (ref 26.0–34.0)
MCHC: 29.7 g/dL — ABNORMAL LOW (ref 30.0–36.0)
MCV: 89.4 fL (ref 80.0–100.0)
Monocytes Absolute: 0.6 10*3/uL (ref 0.1–1.0)
Monocytes Relative: 14 %
Neutro Abs: 2.7 10*3/uL (ref 1.7–7.7)
Neutrophils Relative %: 60 %
Platelets: 271 10*3/uL (ref 150–400)
RBC: 3.69 MIL/uL — ABNORMAL LOW (ref 3.87–5.11)
RDW: 15.3 % (ref 11.5–15.5)
WBC: 4.5 10*3/uL (ref 4.0–10.5)
nRBC: 0 % (ref 0.0–0.2)

## 2020-07-19 LAB — LACTIC ACID, PLASMA
Lactic Acid, Venous: 0.8 mmol/L (ref 0.5–1.9)
Lactic Acid, Venous: 0.7 mmol/L (ref 0.5–1.9)

## 2020-07-19 LAB — PROTIME-INR
INR: 1.2 (ref 0.8–1.2)
Prothrombin Time: 14.4 seconds (ref 11.4–15.2)

## 2020-07-19 NOTE — ED Provider Notes (Signed)
[facility] EMERGENCY DEPARTMENT Provider Note   CSN: [id number] Arrival date & time: 07/19/20  1559     History Chief Complaint  Patient presents with  . Cellulitis    Brenda Murray is a 69 y.o. female.  Patient with history of bipolar, hypothyroidism, chronic back pain, lives alone in independent living, walks with walker, presents with LE pain and swelling for the past week. Per son, she had a similar episode one month ago that resolved. She saw her primary care MD 4 days ago and was given Lasix. She was no better one day later so her son took her to Urgent Care where she was placed on Doxycycline for cellulitis. Son states she continued the Lasix and Doxycycline through today when he felt the leg was worsening. No fever, chest pain, SoB, nausea, vomiting. No injury to the LE's. She reports pain and swelling bilaterally that is now interferring with being able to walk safely.   The history is provided by the patient. No language interpreter was used.       Past Medical History:  Diagnosis Date  . Anxiety   . Bipolar disorder (HCC)   . Chronic back pain   . History of indigestion   . Hypothyroidism     Patient Active Problem List   Diagnosis Date Noted  . Schizoaffective disorder, bipolar type (HCC) 11/10/2017  . Hoarseness 11/09/2016  . Parotid swelling 03/22/2016  . Nausea and vomiting 11/04/2014  . Hypothyroid 11/04/2014  . Metabolic alkalosis 11/04/2014  . Hypokalemia 11/04/2014  . Knee joint replaced by other means 07/16/2014  . Confusion 07/11/2013  . Depressive disorder, not elsewhere classified 07/11/2013  . Anxiety 07/11/2013  . Vitamin B12 deficiency 06/28/2013  . Gastroesophageal reflux disease without esophagitis 06/28/2013  . Anal pruritus 06/28/2013    Past Surgical History:  Procedure Laterality Date  . carpel tunnel    . ESOPHAGOGASTRODUODENOSCOPY N/A 11/06/2014   Procedure: ESOPHAGOGASTRODUODENOSCOPY (EGD);  Surgeon: Brenda Elizabeth, MD;  Location: [facility] ENDOSCOPY;  Service: Endoscopy;  Laterality: N/A;  . JOINT REPLACEMENT     bilat knee  . TONSILLECTOMY    . TOTAL KNEE REVISION  05/01/2012   Procedure: TOTAL KNEE REVISION;  Surgeon: Brenda Mutton, MD;  Location: [facility] OR;  Service: Orthopedics;  Laterality: Right;     OB History   No obstetric history on file.     Family History  Problem Relation Age of Onset  . Breast cancer Neg Hx     Social History   Tobacco Use  . Smoking status: Former Games developer  . Smokeless tobacco: Never Used  Vaping Use  . Vaping Use: Never used  Substance Use Topics  . Alcohol use: No  . Drug use: No    Home Medications Prior to Admission medications   Medication Sig Start Date End Date Taking? Authorizing Provider  acetaminophen (TYLENOL) 650 MG CR tablet Take 1,300 mg by mouth every 12 (twelve) hours.    [provider]  antiseptic oral rinse (BIOTENE) LIQD 15 mLs by Mouth Rinse route as needed for dry mouth.    [provider]  Carboxymethylcell-Hypromellose (GENTEAL OP) Place 1 drop into both eyes 2 (two) times daily.    [provider]  doxycycline (VIBRAMYCIN) 100 MG capsule Take 1 capsule (100 mg total) by mouth 2 (two) times daily for 7 days. 07/17/20 07/24/20  Brenda Murray A, NP  DULoxetine (CYMBALTA) 60 MG capsule Take 60 mg by mouth at bedtime.  12/10/16  [provider]  ergocalciferol (VITAMIN D2) 1.25 MG (50000 UT) capsule Take 50,000 Units by mouth once a week.    [provider]  fluticasone (FLONASE) 50 MCG/ACT nasal spray Place 1 spray into both nostrils daily.    [provider]  guaiFENesin (ROBITUSSIN) 100 MG/5ML liquid Take 200 mg by mouth every 4 (four) hours as needed for cough.    [provider]  levothyroxine (SYNTHROID, LEVOTHROID) 100 MCG tablet Take 100 mcg by mouth daily before breakfast.    [provider]  lithium carbonate 150 MG capsule Take 300 mg by mouth at bedtime.  06/23/17    [provider]  LORazepam (ATIVAN) 1 MG tablet Take 1 mg by mouth at bedtime. 07/23/17   [provider]  Melatonin 5 MG CAPS Take 5 mg by mouth at bedtime.    [provider]  Multiple Vitamins-Minerals (PRESERVISION AREDS 2+MULTI VIT PO) Take 1 capsule by mouth daily.    [provider]  NEURONTIN 300 MG capsule Take 300 mg by mouth 3 (three) times daily as needed. 01/16/20   [provider]  sodium chloride (OCEAN) 0.65 % SOLN nasal spray Place 1 spray into both nostrils as needed for congestion. 12/06/17   Brenda Haber, NP    Allergies    Patient has no known allergies.  Review of Systems   Review of Systems  Constitutional: Negative for chills and fever.  Respiratory: Negative.  Negative for cough and shortness of breath.   Cardiovascular: Positive for leg swelling. Negative for chest pain.  Gastrointestinal: Negative.  Negative for abdominal pain and nausea.  Musculoskeletal:       Pain in bilateral LE's.   Skin: Positive for color change.  Neurological: Positive for weakness.    Physical Exam Updated Vital Signs BP (!) 145/90   Pulse 63   Temp (!) 97.5 F (36.4 C)   Resp 18   Wt 98.9 kg   SpO2 98%   BMI 37.42 kg/m   Physical Exam Vitals and nursing note reviewed.  Constitutional:      Appearance: She is well-developed.  HENT:     Head: Normocephalic.  Cardiovascular:     Rate and Rhythm: Normal rate and regular rhythm.  Pulmonary:     Effort: Pulmonary effort is normal.     Breath sounds: Normal breath sounds. No wheezing, rhonchi or rales.  Abdominal:     General: Bowel sounds are normal.     Palpations: Abdomen is soft.     Tenderness: There is no abdominal tenderness. There is no guarding or rebound.  Musculoskeletal:        General: Normal range of motion.     Cervical back: Normal range of motion and neck supple.     Comments: Left greater than right LE edema. Redness and warmth to left LE c/w cellulitic  changes. No wound or bleeding.   Skin:    General: Skin is warm and dry.     Findings: No rash.  Neurological:     Mental Status: She is alert and oriented to person, place, and time.     ED Results / Procedures / Treatments   Labs (all labs ordered are listed, but only abnormal results are displayed) Labs Reviewed  COMPREHENSIVE METABOLIC PANEL - Abnormal; Notable for the following components:      Result Value   BUN 30 (*)    All other components within normal limits  CBC WITH DIFFERENTIAL/PLATELET - Abnormal; Notable for  the following components:   RBC 3.69 (*)    Hemoglobin 9.8 (*)    HCT 33.0 (*)    MCHC 29.7 (*)    All other components within normal limits  URINALYSIS, ROUTINE W REFLEX MICROSCOPIC - Abnormal; Notable for the following components:   Bacteria, UA RARE (*)    All other components within normal limits  CULTURE, BLOOD (ROUTINE X 2)  CULTURE, BLOOD (ROUTINE X 2)  LACTIC ACID, PLASMA  LACTIC ACID, PLASMA  PROTIME-INR  BRAIN NATRIURETIC PEPTIDE    EKG None  Radiology DG Chest 2 View  Result Date: 07/19/2020 CLINICAL DATA:  Suspected sepsis.  Bilateral lower leg swelling. EXAM: CHEST - 2 VIEW COMPARISON:  May 30, 2020 FINDINGS: The heart size is borderline to mildly enlarged. Hila and mediastinum are normal. No pneumothorax. Bilateral pulmonary opacities, primarily interstitial. No other acute abnormalities. IMPRESSION: Bilateral interstitial opacities suggestive of mild edema. Atypical infection also possible. Recommend clinical correlation and follow-up to resolution. Electronically Signed   By: Gerome Sam III M.D   On: 07/19/2020 17:29    Procedures Procedures (including critical care time)  Medications Ordered in ED Medications - No data to display  ED Course  I have reviewed the triage vital signs and the nursing notes.  Pertinent labs & imaging results that were available during my care of the patient were reviewed by me and considered in  my medical decision making (see chart for details).    MDM Rules/Calculators/A&P                          Patient to ED with c/o bilateral LE pain and swelling x 4 days, second episode in the last month. No h/o CHF, no sOB, or chest pain. No fever.  Seen by her doctor (x1) and Urgent Care (x2), started on Lasix and doxycycline, now with symptoms that are worsening.   On exam, she has a very swollen left LE that is red and warm to the touch. Right leg has minimal swelling without discoloration. No white count or fever, but findings concerning for cellulitis that has failed outpatient treatment. She reports (son corroborates) she is now have difficulty walking due to symptoms despite having a walker.   Feel she would benefit from admission for IV abx. She has been COVID vaccinated.  Final Clinical Impression(s) / ED Diagnoses Final diagnoses:  None   1. Cellulitis left LE  Rx / DC Orders ED Discharge Orders    None       Danne Harbor 07/20/20 0127    Nira Conn, MD 07/21/20 [phone]

## 2020-07-19 NOTE — ED Triage Notes (Signed)
Pt arrives POV for eval of L leg cellutis to L leg, noted last week. Pt was trialed on PO diuretic and antibiotic from UC unsuccessfully. Worsening L lower edema/redness and blt LE edema.

## 2020-07-19 NOTE — ED Triage Notes (Signed)
Patient here following up from visit two days ago. Reports swelling in bilateral lower extremities is not getting any better. Son here today is worried about the patient falling at home by herself. Patient also reports that it is now very painful to walk. Patient is lethargic in triage.

## 2020-07-19 NOTE — Discharge Instructions (Signed)
Please report to the emergency room now for further work up as you have failed treatment through our clinic for your lower leg swelling and left leg cellulitis.

## 2020-07-19 NOTE — ED Provider Notes (Signed)
[facility]   MRN: [id number] DOB: 01-Oct-1951  Subjective:   Brenda Murray is a 69 y.o. female presenting for recheck on bilateral lower leg swelling. Patient has been seen twice for the same symptom set in July, then again 2 days ago. Has a hx of bilateral knee replacement. Has undergone course of Lasix, followed by doxycycline for cellulitis 2 days ago.  She contacted her PCP with [facility] who recommended that she be considered for admission to the hospital and advised to go to the emergency room.  However, they came to our clinic for evaluation prior to that.  No current facility-administered medications for this encounter.  Current Outpatient Medications:  .  acetaminophen (TYLENOL) 650 MG CR tablet, Take 1,300 mg by mouth every 12 (twelve) hours., Disp: , Rfl:  .  antiseptic oral rinse (BIOTENE) LIQD, 15 mLs by Mouth Rinse route as needed for dry mouth., Disp: , Rfl:  .  Carboxymethylcell-Hypromellose (GENTEAL OP), Place 1 drop into both eyes 2 (two) times daily., Disp: , Rfl:  .  doxycycline (VIBRAMYCIN) 100 MG capsule, Take 1 capsule (100 mg total) by mouth 2 (two) times daily for 7 days., Disp: 14 capsule, Rfl: 0 .  DULoxetine (CYMBALTA) 60 MG capsule, Take 60 mg by mouth at bedtime. , Disp: , Rfl:  .  ergocalciferol (VITAMIN D2) 1.25 MG (50000 UT) capsule, Take 50,000 Units by mouth once a week., Disp: , Rfl:  .  fluticasone (FLONASE) 50 MCG/ACT nasal spray, Place 1 spray into both nostrils daily., Disp: , Rfl:  .  guaiFENesin (ROBITUSSIN) 100 MG/5ML liquid, Take 200 mg by mouth every 4 (four) hours as needed for cough., Disp: , Rfl:  .  levothyroxine (SYNTHROID, LEVOTHROID) 100 MCG tablet, Take 100 mcg by mouth daily before breakfast., Disp: , Rfl:  .  lithium carbonate 150 MG capsule, Take 300 mg by mouth at bedtime. , Disp: , Rfl: 0 .  LORazepam (ATIVAN) 1 MG tablet, Take 1 mg by mouth at bedtime., Disp: , Rfl:  .  Melatonin 5 MG CAPS, Take 5 mg by mouth at bedtime., Disp:  , Rfl:  .  Multiple Vitamins-Minerals (PRESERVISION AREDS 2+MULTI VIT PO), Take 1 capsule by mouth daily., Disp: , Rfl:  .  NEURONTIN 300 MG capsule, Take 300 mg by mouth 3 (three) times daily as needed., Disp: , Rfl:  .  sodium chloride (OCEAN) 0.65 % SOLN nasal spray, Place 1 spray into both nostrils as needed for congestion., Disp: 60 mL, Rfl: 0   No Known Allergies  Past Medical History:  Diagnosis Date  . Anxiety   . Bipolar disorder (HCC)   . Chronic back pain   . History of indigestion   . Hypothyroidism      Past Surgical History:  Procedure Laterality Date  . carpel tunnel    . ESOPHAGOGASTRODUODENOSCOPY N/A 11/06/2014   Procedure: ESOPHAGOGASTRODUODENOSCOPY (EGD);  Surgeon: Charna Elizabeth, MD;  Location: [facility] ENDOSCOPY;  Service: Endoscopy;  Laterality: N/A;  . JOINT REPLACEMENT     bilat knee  . TONSILLECTOMY    . TOTAL KNEE REVISION  05/01/2012   Procedure: TOTAL KNEE REVISION;  Surgeon: Raymon Mutton, MD;  Location: [facility] OR;  Service: Orthopedics;  Laterality: Right;    Family History  Problem Relation Age of Onset  . Breast cancer Neg Hx     Social History   Tobacco Use  . Smoking status: Former Games developer  . Smokeless tobacco: Never Used  Vaping Use  . Vaping Use:  Never used  Substance Use Topics  . Alcohol use: No  . Drug use: No    ROS   Objective:   Vitals: BP 124/64   Pulse 80   Temp 98.4 F (36.9 C)   Resp 14   SpO2 99%   Physical Exam Constitutional:      General: She is not in acute distress.    Appearance: Normal appearance. She is well-developed. She is not ill-appearing, toxic-appearing or diaphoretic.  HENT:     Head: Normocephalic and atraumatic.     Nose: Nose normal.     Mouth/Throat:     Mouth: Mucous membranes are moist.  Eyes:     Extraocular Movements: Extraocular movements intact.     Pupils: Pupils are equal, round, and reactive to light.  Cardiovascular:     Rate and Rhythm: Normal rate and regular rhythm.     Pulses:  Normal pulses.     Heart sounds: Normal heart sounds. No murmur heard.  No friction rub. No gallop.   Pulmonary:     Effort: Pulmonary effort is normal. No respiratory distress.     Breath sounds: Normal breath sounds. No stridor. No wheezing, rhonchi or rales.  Musculoskeletal:        General: Tenderness (Of both lower legs from lower midportion to her feet, left worse than right) present.     Right lower leg: Edema (trace-1+) present.     Left lower leg: Edema (1+) present.  Skin:    General: Skin is warm and dry.     Findings: No rash.  Neurological:     Mental Status: She is alert and oriented to person, place, and time.  Psychiatric:        Mood and Affect: Mood normal.        Behavior: Behavior normal.        Thought Content: Thought content normal.     Assessment and Plan :   PDMP not reviewed this encounter.  1. Peripheral edema   2. Left leg swelling   3. Spinal stenosis of lumbar region, unspecified whether neurogenic claudication present     Patient has failed treatment with Lasix then doxycycline.  At this point, we will follow her PCPs recommendation for further evaluation in the emergency room issues worsening.  Her vital signs are stable for discharge and transport by personal vehicle, her family members with her complex for safety, will drive her there now.   Wallis Bamberg, [place] 07/19/20 1559

## 2020-07-19 NOTE — ED Notes (Signed)
Lab to add BNP °

## 2020-07-20 ENCOUNTER — Observation Stay (HOSPITAL_COMMUNITY): Payer: Medicare (Managed Care)

## 2020-07-20 DIAGNOSIS — L03115 Cellulitis of right lower limb: Secondary | ICD-10-CM | POA: Diagnosis present

## 2020-07-20 DIAGNOSIS — Z79890 Hormone replacement therapy (postmenopausal): Secondary | ICD-10-CM | POA: Diagnosis not present

## 2020-07-20 DIAGNOSIS — E538 Deficiency of other specified B group vitamins: Secondary | ICD-10-CM | POA: Diagnosis present

## 2020-07-20 DIAGNOSIS — Z20822 Contact with and (suspected) exposure to covid-19: Secondary | ICD-10-CM | POA: Diagnosis present

## 2020-07-20 DIAGNOSIS — E039 Hypothyroidism, unspecified: Secondary | ICD-10-CM

## 2020-07-20 DIAGNOSIS — L0390 Cellulitis, unspecified: Secondary | ICD-10-CM | POA: Diagnosis present

## 2020-07-20 DIAGNOSIS — G8929 Other chronic pain: Secondary | ICD-10-CM | POA: Diagnosis present

## 2020-07-20 DIAGNOSIS — K219 Gastro-esophageal reflux disease without esophagitis: Secondary | ICD-10-CM | POA: Diagnosis present

## 2020-07-20 DIAGNOSIS — Z6837 Body mass index (BMI) 37.0-37.9, adult: Secondary | ICD-10-CM | POA: Diagnosis not present

## 2020-07-20 DIAGNOSIS — L03116 Cellulitis of left lower limb: Secondary | ICD-10-CM | POA: Diagnosis present

## 2020-07-20 DIAGNOSIS — F250 Schizoaffective disorder, bipolar type: Secondary | ICD-10-CM | POA: Diagnosis present

## 2020-07-20 DIAGNOSIS — Z87891 Personal history of nicotine dependence: Secondary | ICD-10-CM | POA: Diagnosis not present

## 2020-07-20 DIAGNOSIS — E669 Obesity, unspecified: Secondary | ICD-10-CM | POA: Diagnosis present

## 2020-07-20 DIAGNOSIS — L03119 Cellulitis of unspecified part of limb: Secondary | ICD-10-CM | POA: Diagnosis not present

## 2020-07-20 DIAGNOSIS — Z79899 Other long term (current) drug therapy: Secondary | ICD-10-CM | POA: Diagnosis not present

## 2020-07-20 DIAGNOSIS — Z96651 Presence of right artificial knee joint: Secondary | ICD-10-CM | POA: Diagnosis present

## 2020-07-20 DIAGNOSIS — F419 Anxiety disorder, unspecified: Secondary | ICD-10-CM | POA: Diagnosis present

## 2020-07-20 DIAGNOSIS — D649 Anemia, unspecified: Secondary | ICD-10-CM | POA: Diagnosis present

## 2020-07-20 LAB — BASIC METABOLIC PANEL
Anion gap: 4 — ABNORMAL LOW (ref 5–15)
BUN: 24 mg/dL — ABNORMAL HIGH (ref 8–23)
CO2: 32 mmol/L (ref 22–32)
Calcium: 10 mg/dL (ref 8.9–10.3)
Chloride: 99 mmol/L (ref 98–111)
Creatinine, Ser: 0.8 mg/dL (ref 0.44–1.00)
GFR calc Af Amer: >60 mL/min (ref >60)
GFR calc non Af Amer: >60 mL/min (ref >60)
Glucose, Bld: 85 mg/dL (ref 70–99)
Potassium: 4.2 mmol/L (ref 3.5–5.1)
Sodium: 135 mmol/L (ref 135–145)

## 2020-07-20 LAB — CBC WITH DIFFERENTIAL/PLATELET
Abs Immature Granulocytes: 0.03 10*3/uL (ref 0.00–0.07)
Basophils Absolute: 0 10*3/uL (ref 0.0–0.1)
Basophils Relative: 0 %
Eosinophils Absolute: 0.3 10*3/uL (ref 0.0–0.5)
Eosinophils Relative: 6 %
HCT: 33.2 % — ABNORMAL LOW (ref 36.0–46.0)
Hemoglobin: 10 g/dL — ABNORMAL LOW (ref 12.0–15.0)
Immature Granulocytes: 1 %
Lymphocytes Relative: 19 %
Lymphs Abs: 0.8 10*3/uL (ref 0.7–4.0)
MCH: 26.5 pg (ref 26.0–34.0)
MCHC: 30.1 g/dL (ref 30.0–36.0)
MCV: 88.1 fL (ref 80.0–100.0)
Monocytes Absolute: 0.6 10*3/uL (ref 0.1–1.0)
Monocytes Relative: 14 %
Neutro Abs: 2.6 10*3/uL (ref 1.7–7.7)
Neutrophils Relative %: 60 %
Platelets: 284 10*3/uL (ref 150–400)
RBC: 3.77 MIL/uL — ABNORMAL LOW (ref 3.87–5.11)
RDW: 15.3 % (ref 11.5–15.5)
WBC: 4.3 10*3/uL (ref 4.0–10.5)
nRBC: 0 % (ref 0.0–0.2)

## 2020-07-20 LAB — LITHIUM LEVEL: Lithium Lvl: 1.05 mmol/L (ref 0.60–1.20)

## 2020-07-20 LAB — FOLATE: Folate: 16.3 ng/mL (ref >5.9)

## 2020-07-20 LAB — MAGNESIUM: Magnesium: 2.2 mg/dL (ref 1.7–2.4)

## 2020-07-20 LAB — BRAIN NATRIURETIC PEPTIDE: B Natriuretic Peptide: 11.5 pg/mL (ref 0.0–100.0)

## 2020-07-20 LAB — VITAMIN B12: Vitamin B-12: 367 pg/mL (ref 180–914)

## 2020-07-20 LAB — SARS CORONAVIRUS 2 BY RT PCR (HOSPITAL ORDER, PERFORMED IN ~~LOC~~ HOSPITAL LAB): SARS Coronavirus 2: NEGATIVE

## 2020-07-20 MED ORDER — LITHIUM CARBONATE 300 MG PO CAPS
300.0000 mg | ORAL_CAPSULE | Freq: Every day | ORAL | Status: DC
  Administered 2020-07-20: 300 mg via ORAL
  Filled 2020-07-20 (×3): qty 1

## 2020-07-20 MED ORDER — GABAPENTIN 300 MG PO CAPS
300.0000 mg | ORAL_CAPSULE | Freq: Three times a day (TID) | ORAL | Status: DC | PRN
  Administered 2020-07-20 – 2020-07-21 (×3): 300 mg via ORAL
  Filled 2020-07-20 (×3): qty 1

## 2020-07-20 MED ORDER — SODIUM CHLORIDE 0.9 % IV SOLN: 250.0000 mL | INTRAVENOUS | Status: DC | PRN

## 2020-07-20 MED ORDER — ENOXAPARIN SODIUM 40 MG/0.4ML ~~LOC~~ SOLN: 40.0000 mg | Freq: Every day | SUBCUTANEOUS | Status: DC

## 2020-07-20 MED ORDER — CEFAZOLIN SODIUM-DEXTROSE 2-4 GM/100ML-% IV SOLN
2.0000 g | Freq: Three times a day (TID) | INTRAVENOUS | Status: DC
  Administered 2020-07-20 – 2020-07-21 (×4): 2 g via INTRAVENOUS
  Filled 2020-07-20 (×8): qty 100

## 2020-07-20 MED ORDER — ACETAMINOPHEN 325 MG PO TABS
650.0000 mg | ORAL_TABLET | ORAL | Status: DC | PRN
  Administered 2020-07-20 – 2020-07-21 (×2): 650 mg via ORAL
  Filled 2020-07-20 (×2): qty 2

## 2020-07-20 MED ORDER — LEVOTHYROXINE SODIUM 100 MCG PO TABS
100.0000 ug | ORAL_TABLET | Freq: Every day | ORAL | Status: DC
  Administered 2020-07-20 – 2020-07-21 (×2): 100 ug via ORAL
  Filled 2020-07-20 (×2): qty 1

## 2020-07-20 MED ORDER — CEFAZOLIN SODIUM-DEXTROSE 1-4 GM/50ML-% IV SOLN
1.0000 g | Freq: Once | INTRAVENOUS | Status: AC
  Administered 2020-07-20: 1 g via INTRAVENOUS
  Filled 2020-07-20: qty 50

## 2020-07-20 MED ORDER — DULOXETINE HCL 60 MG PO CPEP
60.0000 mg | ORAL_CAPSULE | Freq: Every day | ORAL | Status: DC
  Administered 2020-07-20: 60 mg via ORAL
  Filled 2020-07-20: qty 1

## 2020-07-20 MED ORDER — SODIUM CHLORIDE 0.9% FLUSH
3.0000 mL | Freq: Two times a day (BID) | INTRAVENOUS | Status: DC
  Administered 2020-07-20 – 2020-07-21 (×4): 3 mL via INTRAVENOUS

## 2020-07-20 MED ORDER — SODIUM CHLORIDE 0.9% FLUSH: 3.0000 mL | INTRAVENOUS | Status: DC | PRN

## 2020-07-20 MED ORDER — LORAZEPAM 1 MG PO TABS
1.0000 mg | ORAL_TABLET | Freq: Every day | ORAL | Status: DC
  Administered 2020-07-20: 1 mg via ORAL
  Filled 2020-07-20: qty 1

## 2020-07-20 MED ORDER — FUROSEMIDE 10 MG/ML IJ SOLN
40.0000 mg | Freq: Two times a day (BID) | INTRAMUSCULAR | Status: AC
  Administered 2020-07-20 (×2): 40 mg via INTRAVENOUS
  Filled 2020-07-20 (×2): qty 4

## 2020-07-20 MED ORDER — MELATONIN 5 MG PO TABS
5.0000 mg | ORAL_TABLET | Freq: Every day | ORAL | Status: DC
  Administered 2020-07-20: 5 mg via ORAL
  Filled 2020-07-20 (×3): qty 1

## 2020-07-20 MED ORDER — ONDANSETRON HCL 4 MG/2ML IJ SOLN: 4.0000 mg | Freq: Four times a day (QID) | INTRAMUSCULAR | Status: DC | PRN

## 2020-07-20 MED ORDER — FENTANYL CITRATE (PF) 100 MCG/2ML IJ SOLN
50.0000 ug | Freq: Once | INTRAMUSCULAR | Status: AC
  Administered 2020-07-20: 50 ug via INTRAVENOUS
  Filled 2020-07-20: qty 2

## 2020-07-20 MED ORDER — ENOXAPARIN SODIUM 40 MG/0.4ML ~~LOC~~ SOLN
40.0000 mg | SUBCUTANEOUS | Status: DC
  Administered 2020-07-20 – 2020-07-21 (×2): 40 mg via SUBCUTANEOUS
  Filled 2020-07-20 (×2): qty 0.4

## 2020-07-20 MED ORDER — FLUTICASONE PROPIONATE 50 MCG/ACT NA SUSP
1.0000 | Freq: Every day | NASAL | Status: DC
  Administered 2020-07-20 – 2020-07-21 (×2): 1 via NASAL
  Filled 2020-07-20: qty 16

## 2020-07-20 NOTE — H&P (Signed)
PCP:   [facility] Complaint:  Lower extremity cellulitis  HPI: This is a 69 year old female who states approximately 1 week ago she developed pain and redness in both her legs.  The pain has progressed to where she has difficulty walking due to the discomfort.  She denies any fevers, chills, nausea or vomiting.  She saw PCP 3 days ago and was given a prescription of doxycycline.  She has been taking it without any improvement.  She denies that to come to the ER.  Review of Systems:  The patient denies anorexia, fever, weight loss,, vision loss, decreased hearing, hoarseness, chest pain, syncope, dyspnea on exertion, peripheral edema, balance deficits, hemoptysis, abdominal pain, melena, hematochezia, severe indigestion/heartburn, hematuria, incontinence, genital sores, muscle weakness, suspicious skin lesions, transient blindness, difficulty walking, depression, unusual weight change, abnormal bleeding, enlarged lymph nodes, angioedema, and breast masses Positive: Bilateral lower extremity cellulitis, pain, tenderness to palpation.  Past Medical History: Past Medical History:  Diagnosis Date  . Anxiety   . Bipolar disorder (HCC)   . Chronic back pain   . History of indigestion   . Hypothyroidism    Past Surgical History:  Procedure Laterality Date  . carpel tunnel    . ESOPHAGOGASTRODUODENOSCOPY N/A 11/06/2014   Procedure: ESOPHAGOGASTRODUODENOSCOPY (EGD);  Surgeon: Charna Elizabeth, MD;  Location: [facility] ENDOSCOPY;  Service: Endoscopy;  Laterality: N/A;  . JOINT REPLACEMENT     bilat knee  . TONSILLECTOMY    . TOTAL KNEE REVISION  05/01/2012   Procedure: TOTAL KNEE REVISION;  Surgeon: Raymon Mutton, MD;  Location: [facility] OR;  Service: Orthopedics;  Laterality: Right;    Medications: Prior to Admission medications   Medication Sig Start Date End Date Taking? Authorizing Provider  acetaminophen (TYLENOL) 650 MG CR tablet Take 1,300 mg by mouth every  12 (twelve) hours.    [provider]  antiseptic oral rinse (BIOTENE) LIQD 15 mLs by Mouth Rinse route as needed for dry mouth.    [provider]  Carboxymethylcell-Hypromellose (GENTEAL OP) Place 1 drop into both eyes 2 (two) times daily.    [provider]  doxycycline (VIBRAMYCIN) 100 MG capsule Take 1 capsule (100 mg total) by mouth 2 (two) times daily for 7 days. 07/17/20 07/24/20  Dahlia Byes A, NP  DULoxetine (CYMBALTA) 60 MG capsule Take 60 mg by mouth at bedtime.  12/10/16   [provider]  ergocalciferol (VITAMIN D2) 1.25 MG (50000 UT) capsule Take 50,000 Units by mouth once a week.    [provider]  fluticasone (FLONASE) 50 MCG/ACT nasal spray Place 1 spray into both nostrils daily.    [provider]  guaiFENesin (ROBITUSSIN) 100 MG/5ML liquid Take 200 mg by mouth every 4 (four) hours as needed for cough.    [provider]  levothyroxine (SYNTHROID, LEVOTHROID) 100 MCG tablet Take 100 mcg by mouth daily before breakfast.    [provider]  lithium carbonate 150 MG capsule Take 300 mg by mouth at bedtime.  06/23/17   [provider]  LORazepam (ATIVAN) 1 MG tablet Take 1 mg by mouth at bedtime. 07/23/17   [provider]  Melatonin 5 MG CAPS Take 5 mg by mouth at bedtime.    [provider]  Multiple Vitamins-Minerals (PRESERVISION AREDS 2+MULTI VIT PO) Take 1 capsule by mouth daily.    [provider]  NEURONTIN 300 MG capsule Take 300 mg by mouth 3 (three) times daily as needed.  01/16/20   [provider]  sodium chloride (OCEAN) 0.65 % SOLN nasal spray Place 1 spray into both nostrils as needed for congestion. 12/06/17   Georgetta Haber, NP    Allergies:  No Known Allergies  Social History:  reports that she has quit smoking. She has never used smokeless tobacco. She reports that she does not drink alcohol and does not use drugs.  Family History: Family History   Problem Relation Age of Onset  . Breast cancer Neg Hx     Physical Exam: Vitals:   07/19/20 2235 07/20/20 0000 07/20/20 0015 07/20/20 0045  BP: (!) 145/90 115/61 94/70 113/65  Pulse: 63 63 65 76  Resp: 18 (!) 24 14 (!) 50  Temp:    97.7 F (36.5 C)  TempSrc:    Oral  SpO2: 98% 100% 96% 98%  Weight:        General:  Alert and oriented times three, obese, no acute distress Eyes: PERRLA, pink conjunctiva, no scleral icterus ENT: Moist oral mucosa, neck supple, no thyromegaly Lungs: clear to ascultation, no wheeze, no crackles, no use of accessory muscles Cardiovascular: regular rate and rhythm, no regurgitation, no gallops, no murmurs. No carotid bruits, no JVD Abdomen: soft, positive BS, non-tender, non-distended, no organomegaly, not an acute abdomen GU: not examined Neuro: CN II - XII grossly intact, sensation intact Musculoskeletal: strength 5/5 all extremities, no clubbing, cyanosis or >2+ B/L pitting edema Skin: no rash, no subcutaneous crepitation, no decubitus, B/L LE cellulitis, TTP Psych: appropriate patient   Labs on Admission:  Recent Labs    07/19/20 1648  NA 138  K 4.4  CL 102  CO2 30  GLUCOSE 90  BUN 30*  CREATININE 0.94  CALCIUM 9.9   Recent Labs    07/19/20 1648  AST 33  ALT 30  ALKPHOS 57  BILITOT 0.6  PROT 7.6  ALBUMIN 3.7   No results for input(s): LIPASE, AMYLASE in the last 72 hours. Recent Labs    07/19/20 1648  WBC 4.5  NEUTROABS 2.7  HGB 9.8*  HCT 33.0*  MCV 89.4  PLT 271   No results for input(s): CKTOTAL, CKMB, CKMBINDEX, TROPONINI in the last 72 hours. Invalid input(s): POCBNP No results for input(s): DDIMER in the last 72 hours. No results for input(s): HGBA1C in the last 72 hours. No results for input(s): CHOL, HDL, LDLCALC, TRIG, CHOLHDL, LDLDIRECT in the last 72 hours. No results for input(s): TSH, T4TOTAL, T3FREE, THYROIDAB in the last 72 hours.  Invalid input(s): FREET3 No results for input(s): VITAMINB12,  FOLATE, FERRITIN, TIBC, IRON, RETICCTPCT in the last 72 hours.  Micro Results: Recent Results (from the past 240 hour(s))  Culture, blood (Routine x 2)     Status: None (Preliminary result)   Collection Time: 07/19/20 11:01 PM   Specimen: BLOOD  Result Value Ref Range Status   Specimen Description BLOOD LEFT ANTECUBITAL  Final   Special Requests   Final    BOTTLES DRAWN AEROBIC AND ANAEROBIC Blood Culture adequate volume Performed at [facility], [address], [place], [place] [address]    Culture PENDING  Incomplete   Report Status PENDING  Incomplete     Radiological Exams on Admission: DG Chest 2 View  Result Date: 07/19/2020 CLINICAL DATA:  Suspected sepsis.  Bilateral lower leg swelling. EXAM: CHEST - 2 VIEW COMPARISON:  May 30, 2020 FINDINGS: The heart size is borderline to mildly enlarged. Hila and mediastinum are normal. No pneumothorax. Bilateral pulmonary  opacities, primarily interstitial. No other acute abnormalities. IMPRESSION: Bilateral interstitial opacities suggestive of mild edema. Atypical infection also possible. Recommend clinical correlation and follow-up to resolution. Electronically Signed   By: Gerome Sam III M.D   On: 07/19/2020 17:29    Assessment/Plan Present on Admission: . Lower extremity cellulitis -bring in for observation on dsurg -Failed outpatient therapy -IV antibiotics Ancef ordered -Tylenol as needed  . Schizoaffective disorder, bipolar type (HCC) -Home meds resumed.  Lithium level ordered  . Vitamin B12 deficiency   Tayvon Culley 07/20/2020, 1:46 AM

## 2020-07-20 NOTE — Progress Notes (Signed)
Spoke with Mr.Brenda Murray and updated him on Ms.[name]'s current hospitalization and plan to continue IV antibiotics for now.

## 2020-07-20 NOTE — ED Notes (Signed)
Received bedside report from [facility]. Pt moved to room and pt placed on cardiac monitor. Pt has purewick in place. AAOx4.

## 2020-07-20 NOTE — Progress Notes (Signed)
Pharmacy Antibiotic Note  Brenda Murray is a 69 y.o. female admitted on 07/19/2020 with bilateral LE cellulitis.  Pharmacy has been consulted for Ancef dosing.  Plan: Ancef 2 g IV q8h  Weight: 98.9 kg (218 lb)  Temp (24hrs), Avg:98 F (36.7 C), Min:97.5 F (36.4 C), Max:98.4 F (36.9 C)  Recent Labs  Lab 07/19/20 1648 07/19/20 1836  WBC 4.5  --   CREATININE 0.94  --   LATICACIDVEN 0.8 0.7    Estimated Creatinine Clearance: 64.6 mL/min (by C-G formula based on SCr of 0.94 mg/dL).    No Known Allergies   Eddie Candle 07/20/2020 2:28 AM

## 2020-07-20 NOTE — Progress Notes (Signed)
   Subjective:  Brenda Murray is a 69 y.o. F with PMH of schizoaffective disorder, chronic back pain, Hypothyroidism admit for cellulitis on hospital day 0  Brenda Murray was examined and evaluated at bedside this am. She was observed resting comfortably in bed, eating breakfast. She was noted to be intermittently tearful stating she misses her son. Provides additional history stating she has been to multiple different providers for her leg pain which started ~2 weeks ago but oral antibiotics seemed to have made her symptoms worse rather than improving it. She denies any systemic symptoms but has been having difficulty with ambulation with her walker due to the pain. Otherwise denies any acute complaints overnight. Denies any fevers or chills. Lower extremity pain is well controlled at the moment.   Objective:  Vital signs in last 24 hours: Vitals:   07/20/20 0245 07/20/20 0345 07/20/20 0400 07/20/20 0500  BP: (!) 100/55 131/79 139/83 117/73  Pulse: 63 72 76 72  Resp: (!) 22 19 (!) 21 20  Temp:    97.9 F (36.6 C)  TempSrc:    Oral  SpO2: 100% (!) 88% 98% 100%  Weight:       Gen: Well-developed, well nourished HEENT: NCAT head, hearing intact, EOMI, MMM Neck: supple, ROM intact CV: RRR, S1, S2 normal, No rubs, no murmurs, no gallops Pulm: CTAB, No rales, no wheezes Abd: Soft, BS+, NTND Extm: ROM intact, Peripheral pulses intact, LLE lower shin / ankle with erythema, non-pitting edema and tenderness to palpation without palpable abscess or crepitus. L Ankle with intact ROM. RLE w mild tenderness and erythema Psych: tearful  Assessment/Plan:  Principal Problem:   Lower extremity cellulitis Active Problems:   Vitamin B12 deficiency   Schizoaffective disorder, bipolar type (HCC)   Hypothyroidism   Cellulitis  Brenda Murray is a 69 y.o. F with PMH of schizoaffective disorder, chronic back pain, Hypothyroidism admit for lower extremity swelling and pain due to non-purulent  cellulitis.  Bilateral Lower Extremity Swelling 2/2 Non-purulent Cellulitis  Present w/ bilateral lower extremity swelling, pain, warmth consistent with non-purulent cellulitis. Chart review confirms visits to PCP and urgent care center where she was prescribed furosemide and doxycycline. Despite 3 days of therapy, her symptoms did not improve. With failure of improvement with oral, reasonable to get short course of IV antibiotics. No systemic symptoms/leukocytosis/fevers to suggest bacteremia. L appear to be worse than R. Will get X-ray of the ankle to assess for joint involvement or drainable effusion. - DG L Ankle - C/w IV cefazolin (day 1/3) - Trend cbc - Monitor vitals - F/u blood cultures  Schizoaffective Disorder On lithium, duloxetine, ativan at home. Tearful this am. Lithium level @ 1.05.  - C/w home meds  Chronic Normocytic anemia Appear to be at baseline. Hgb of 10.6 month ago. Hgb 10.0 this am. MCV 88.1 - Check b12, folate - Trend cbc  Hypothyroidism On levothyroxine at home - C/w home dose  DVT prophx: lovenox Diet: HH Bowel: Miralax Code: Full  Prior to Admission Living Arrangement: Home Anticipated Discharge Location: Home Barriers to Discharge: Medical treatment Dispo: Anticipated discharge in approximately 2-3 day(s).   Brenda Barrio, MD 07/20/2020, 7:36 AM Pager: [phone] After 5pm on weekdays and 1pm on weekends: On Call Pager: [phone]

## 2020-07-20 NOTE — ED Notes (Signed)
02 desat to 88 after pt was given fentanyl. Pt placed on 2L 02 for comfort. 02 sat now 97%

## 2020-07-20 NOTE — ED Notes (Addendum)
Attempted to update pt son, Mauricia Area. Left voicemail to call back.

## 2020-07-21 DIAGNOSIS — D649 Anemia, unspecified: Secondary | ICD-10-CM

## 2020-07-21 DIAGNOSIS — E039 Hypothyroidism, unspecified: Secondary | ICD-10-CM

## 2020-07-21 DIAGNOSIS — L03115 Cellulitis of right lower limb: Secondary | ICD-10-CM

## 2020-07-21 DIAGNOSIS — L03116 Cellulitis of left lower limb: Principal | ICD-10-CM

## 2020-07-21 LAB — BASIC METABOLIC PANEL
Anion gap: 11 (ref 5–15)
BUN: 17 mg/dL (ref 8–23)
CO2: 30 mmol/L (ref 22–32)
Calcium: 9.7 mg/dL (ref 8.9–10.3)
Chloride: 96 mmol/L — ABNORMAL LOW (ref 98–111)
Creatinine, Ser: 0.8 mg/dL (ref 0.44–1.00)
GFR calc Af Amer: >60 mL/min (ref >60)
GFR calc non Af Amer: >60 mL/min (ref >60)
Glucose, Bld: 114 mg/dL — ABNORMAL HIGH (ref 70–99)
Potassium: 3.6 mmol/L (ref 3.5–5.1)
Sodium: 137 mmol/L (ref 135–145)

## 2020-07-21 LAB — CBC
HCT: 31.8 % — ABNORMAL LOW (ref 36.0–46.0)
Hemoglobin: 9.5 g/dL — ABNORMAL LOW (ref 12.0–15.0)
MCH: 26 pg (ref 26.0–34.0)
MCHC: 29.9 g/dL — ABNORMAL LOW (ref 30.0–36.0)
MCV: 87.1 fL (ref 80.0–100.0)
Platelets: 266 10*3/uL (ref 150–400)
RBC: 3.65 MIL/uL — ABNORMAL LOW (ref 3.87–5.11)
RDW: 15 % (ref 11.5–15.5)
WBC: 4.5 10*3/uL (ref 4.0–10.5)
nRBC: 0 % (ref 0.0–0.2)

## 2020-07-21 MED ORDER — CEPHALEXIN 500 MG PO CAPS: 500.0000 mg | ORAL_CAPSULE | Freq: Four times a day (QID) | ORAL | 0 refills | Status: AC

## 2020-07-21 NOTE — ED Provider Notes (Signed)
Attestation: Medical screening examination/treatment/procedure(s) were conducted as a shared visit with non-physician practitioner(s) and myself.  I personally evaluated the patient during the encounter.   Briefly, the patient is a 69 y.o. female with h/o bipolar, hypothyroidism, chronic back pain, lives alone in independent living, walks with walker, presents with LE pain and swelling for the past week. Currently being treated with Doxy without improvement  Vitals:   07/21/20 0058 07/21/20 0527  BP: 108/66 116/72  Pulse: 73 79  Resp: 18 18  Temp: 97.8 F (36.6 C) 97.8 F (36.6 C)  SpO2: 95% 96%    CONSTITUTIONAL:  nontoxic-appearing, NAD NEURO:  Alert and oriented x 3, no focal deficits EYES:  pupils equal and reactive ENT/NECK:  trachea midline, no JVD CARDIO:  reg rate, reg rhythm, well-perfused PULM:  None labored breathing GI/GU:  Abdomen non-distended MSK/SPINE:  No gross deformities, BLE edema/swelling L>R with redness to L up to knee.SKIN:  no rash, atraumatic PSYCH:  Appropriate speech and behavior   EKG Interpretation  Date/Time:    Ventricular Rate:    PR Interval:    QRS Duration:   QT Interval:    QTC Calculation:   R Axis:     Text Interpretation:         Labs reassuring w/o evidence of sepsis. Failed OP management for cellulitis. Will admit for IV Abx.      Nira Conn, MD 07/21/20 [phone]

## 2020-07-21 NOTE — Care Management (Signed)
Orders for HHPT. Called [facility] social worker Colleen [phone]. They have Epic access and will arrange PT.   Ronny Flurry RN

## 2020-07-21 NOTE — Discharge Summary (Signed)
Name: Brenda Murray MRN: [id number] DOB: 09-15-1951 69 y.o. PCP: [facility]  Date of Admission: 07/19/2020  4:26 PM Date of Discharge: 07/21/2020 Attending Physician: Earl Lagos, MD  Discharge Diagnosis: 1. Bilateral Lower Extremity Swelling 2/2 Non-purulent Cellulitis  Discharge Medications: Allergies as of 07/21/2020   No Known Allergies     Medication List    STOP taking these medications   doxycycline 100 MG capsule Commonly known as: VIBRAMYCIN     TAKE these medications   acetaminophen 650 MG CR tablet Commonly known as: TYLENOL Take 1,300 mg by mouth every 12 (twelve) hours.   antiseptic oral rinse Liqd 15 mLs by Mouth Rinse route as needed for dry mouth.   cephALEXin 500 MG capsule Commonly known as: KEFLEX Take 1 capsule (500 mg total) by mouth 4 (four) times daily for 4 days. Start taking on: July 22, 2020   DULoxetine 60 MG capsule Commonly known as: CYMBALTA Take 60 mg by mouth at bedtime.   ergocalciferol 1.25 MG (50000 UT) capsule Commonly known as: VITAMIN D2 Take 50,000 Units by mouth once a week.   famotidine 20 MG tablet Commonly known as: PEPCID Take 20 mg by mouth daily.   fluticasone 50 MCG/ACT nasal spray Commonly known as: FLONASE Place 1 spray into both nostrils daily.   GENTEAL OP Place 1 drop into both eyes 2 (two) times daily.   guaiFENesin 100 MG/5ML liquid Commonly known as: ROBITUSSIN Take 200 mg by mouth every 4 (four) hours as needed for cough.   levothyroxine 100 MCG tablet Commonly known as: SYNTHROID Take 100 mcg by mouth daily before breakfast. What changed: Another medication with the same name was removed. Continue taking this medication, and follow the directions you see here.   lithium 300 MG tablet Take 300 mg by mouth in the morning and at bedtime.   LORazepam 1 MG tablet Commonly known as: ATIVAN Take 1 mg by mouth at bedtime.   Melatonin 5 MG Caps Take 5 mg  by mouth at bedtime.   Neurontin 300 MG capsule Generic drug: gabapentin Take 300 mg by mouth 2 (two) times daily.   OLANZapine 10 MG tablet Commonly known as: ZYPREXA Take 10 mg by mouth in the morning and at bedtime.   PRESERVISION AREDS 2+MULTI VIT PO Take 1 capsule by mouth daily.   sodium chloride 0.65 % Soln nasal spray Commonly known as: OCEAN Place 1 spray into both nostrils as needed for congestion.   traZODone 50 MG tablet Commonly known as: DESYREL Take 50 mg by mouth at bedtime.       Disposition and follow-up:   Brenda Murray was discharged from [facility] in Stable condition.  At the hospital follow up visit please address:  1.  Bilateral Lower Extremity Swelling 2/2 Non-purulent Cellulitis: Ensure she has completed her antibiotic course and that her symptoms have completely resolved.  2.  Labs / imaging needed at time of follow-up: BMP  3.  Pending labs/ test needing follow-up: Blood Culture  Follow-up Appointments: Please see your PCP within a week.  Hospital Course by problem list: 1. Bilateral Lower Extremity Swelling 2/2 Non-purulent Cellulitis: Had pain and swelling in both of her legs about a week ago. 3 days before admission saw PCP and was started on oral Doxycyline but continue to worsen. Was admitted and started on IV Cefazolin. Right leg resolved after 1 day and Left leg greatly improved but still had tenderness. Was  discharged on 4 days of Keflex.   Discharge Vitals:   BP 123/83 (BP Location: Left Arm)   Pulse 73   Temp 98.3 F (36.8 C) (Oral)   Resp 16   Wt 98.9 kg   SpO2 98%   BMI 37.42 kg/m   Pertinent Labs, Studies, and Procedures:  BMP Latest Ref Rng & Units 07/21/2020 07/20/2020 07/19/2020  Glucose 70 - 99 mg/dL 829(H) 85 90  BUN 8 - 23 mg/dL 17 37(J) 69(C)  Creatinine 0.44 - 1.00 mg/dL 7.89 3.81 0.17  Sodium 135 - 145 mmol/L 137 135 138  Potassium 3.5 - 5.1 mmol/L 3.6 4.2 4.4  Chloride 98 - 111 mmol/L 96(L)  99 102  CO2 22 - 32 mmol/L 30 32 30  Calcium 8.9 - 10.3 mg/dL 9.7 51.0 9.9   CBC Latest Ref Rng & Units 07/21/2020 07/20/2020 07/19/2020  WBC 4.0 - 10.5 K/uL 4.5 4.3 4.5  Hemoglobin 12.0 - 15.0 g/dL 2.5(E) 10.0(L) 9.8(L)  Hematocrit 36 - 46 % 31.8(L) 33.2(L) 33.0(L)  Platelets 150 - 400 K/uL 266 284 271   DG Ankle Left Port: Soft tissue swelling. Heel spurs  Discharge Instructions: Discharge Instructions    Call MD for:  difficulty breathing, headache or visual disturbances   Complete by: As directed    Call MD for:  extreme fatigue   Complete by: As directed    Call MD for:  hives   Complete by: As directed    Call MD for:  persistant dizziness or light-headedness   Complete by: As directed    Call MD for:  persistant nausea and vomiting   Complete by: As directed    Call MD for:  redness, tenderness, or signs of infection (pain, swelling, redness, odor or green/yellow discharge around incision site)   Complete by: As directed    Call MD for:  severe uncontrolled pain   Complete by: As directed    Call MD for:  temperature >100.4   Complete by: As directed    Diet - low sodium heart healthy   Complete by: As directed    Discharge instructions   Complete by: As directed    Dear Brenda Murray, It was a  pleasure taking care of you. You were admitted for Cellulitis of your legs. You were started on IV antibiotics and your legs improved rapidly. You will be sent home on oral antibiotics, Keflex, to take 4 times a day for 4 days.  Thank you very much   Increase activity slowly   Complete by: As directed       Signed: Lauro Franklin, MD 07/21/2020, 1:28 PM   Pager: [phone]

## 2020-07-21 NOTE — Evaluation (Signed)
Occupational Therapy Evaluation Patient Details Name: Brenda Murray MRN: [id number] DOB: [birth year] Today's Date: 07/21/2020    History of Present Illness This is a 69 year old female who states approximately 1 week ago she developed pain and redness in both her legs.  The pain has progressed to where she has difficulty walking due to the discomfort.  has a past medical history of Anxiety, Bipolar disorder (HCC), Chronic back pain, History of indigestion, and Hypothyroidism.   Clinical Impression   Pt walks with a rollator, sponge bathes and dresses herself with exception of tying shoes. She has an aide who assists with housekeeping and her son helps with groceries and transportation. [facility]. Pt presents with LE pain, but was able to ambulate with RW and min guard assist and completed ADL with up to min assist. Will follow acutely. Anticipate pt will be able to return home with support of [facility] resources.    Follow Up Recommendations  Home health OT (through [facility])    Equipment Recommendations  None recommended by OT    Recommendations for Other Services       Precautions / Restrictions Precautions Precautions: Fall Restrictions Weight Bearing Restrictions: No      Mobility Bed Mobility Overal bed mobility: Modified Independent             General bed mobility comments: HOB up  Transfers Overall transfer level: Needs assistance Equipment used: Rolling walker (2 wheeled) Transfers: Sit to/from Stand Sit to Stand: Min guard         General transfer comment: min guard from bed and 3in 1    Balance Overall balance assessment: Needs assistance   Sitting balance-Leahy Scale: Good     Standing balance support: Single extremity supported Standing balance-Leahy Scale: Fair                             ADL either performed or assessed with clinical judgement   ADL Overall ADL's : Needs  assistance/impaired Eating/Feeding: Independent   Grooming: Wash/dry hands;Wash/dry face;Brushing hair;Sitting;Set up   Upper Body Bathing: Minimal assistance;Sitting Upper Body Bathing Details (indicate cue type and reason): assisted with back Lower Body Bathing: Min guard;Sit to/from stand   Upper Body Dressing : Set up;Sitting       Toilet Transfer: Min guard;Ambulation;BSC;RW   Toileting- Architect and Hygiene: Min guard;Sit to/from stand       Functional mobility during ADLs: Min guard;Rolling walker       Vision Baseline Vision/History: No visual deficits Patient Visual Report: No change from baseline       Perception     Praxis      Pertinent Vitals/Pain Pain Assessment: Faces Faces Pain Scale: Hurts little more Pain Location: LEs Pain Descriptors / Indicators: Sore Pain Intervention(s): Monitored during session     Hand Dominance Right   Extremity/Trunk Assessment Upper Extremity Assessment Upper Extremity Assessment: Overall WFL for tasks assessed   Lower Extremity Assessment Lower Extremity Assessment: Defer to PT evaluation   Cervical / Trunk Assessment Cervical / Trunk Assessment: Other exceptions Cervical / Trunk Exceptions: obesity   Communication Communication Communication: No difficulties   Cognition Arousal/Alertness: Awake/alert Behavior During Therapy: WFL for tasks assessed/performed Overall Cognitive Status: No family/caregiver present to determine baseline cognitive functioning  General Comments: Pt initially confused, but became clearer as she awakened and walked about.   General Comments       Exercises     Shoulder Instructions      Home Living Family/patient expects to be discharged to:: Private residence Living Arrangements: Alone Available Help at Discharge: Personal care attendant;Family;Available PRN/intermittently Type of Home: Apartment Home Access:  Level entry     Home Layout: One level     Bathroom Shower/Tub: Chief Strategy Officer: Standard     Home Equipment: Grab bars - tub/shower;Hand held shower head;Bedside commode;Walker - 4 wheels          Prior Functioning/Environment Level of Independence: Needs assistance  Gait / Transfers Assistance Needed: walks with a rollator ADL's / Homemaking Assistance Needed: assisted for housekeeping, transportation   Comments: [facility], pt sponge bathes        OT Problem List: Decreased strength;Impaired balance (sitting and/or standing);Pain      OT Treatment/Interventions: Self-care/ADL training;DME and/or AE instruction;Patient/family education;Therapeutic activities;Balance training    OT Goals(Current goals can be found in the care plan section) Acute Rehab OT Goals Patient Stated Goal: get legs working better OT Goal Formulation: With patient Time For Goal Achievement: 08/04/20 Potential to Achieve Goals: Good ADL Goals Pt Will Transfer to Toilet: with modified independence;ambulating;bedside commode Pt Will Perform Toileting - Clothing Manipulation and hygiene: with modified independence;sit to/from stand Additional ADL Goal #1: Pt will perform sponge bathing and dressing with set up.  OT Frequency: Min 2X/week   Barriers to D/C:            Co-evaluation              AM-PAC OT "6 Clicks" Daily Activity     Outcome Measure Help from another person eating meals?: None Help from another person taking care of personal grooming?: A Little Help from another person toileting, which includes using toliet, bedpan, or urinal?: A Little Help from another person bathing (including washing, rinsing, drying)?: A Little Help from another person to put on and taking off regular upper body clothing?: None Help from another person to put on and taking off regular lower body clothing?: A Little 6 Click Score: 20   End of Session Equipment  Utilized During Treatment: Gait belt;Rolling walker Nurse Communication: Mobility status  Activity Tolerance: Patient tolerated treatment well Patient left: in chair;with call bell/phone within reach;with chair alarm set  OT Visit Diagnosis: Other abnormalities of gait and mobility (R26.89);Muscle weakness (generalized) (M62.81)                Time: 8416-6063 OT Time Calculation (min): 52 min Charges:  OT General Charges $OT Visit: 1 Visit OT Evaluation $OT Eval Moderate Complexity: 1 Mod OT Treatments $Self Care/Home Management : 23-37 mins  Martie Round, OTR/L Acute Rehabilitation Services Pager: [phone] Office: [phone]  Evern Bio 07/21/2020, 10:20 AM

## 2020-07-21 NOTE — Progress Notes (Signed)
Subjective: Interviewed patient at bedside.  She reports that she still has some pain in her left ankle but that it is much improved from yesterday.  Objective:  Vital signs in last 24 hours: Vitals:   07/20/20 1214 07/20/20 1713 07/21/20 0058 07/21/20 0527  BP: 116/66 111/69 108/66 116/72  Pulse: 85 79 73 79  Resp: 18 18 18 18   Temp: 98.7 F (37.1 C) 98.4 F (36.9 C) 97.8 F (36.6 C) 97.8 F (36.6 C)  TempSrc: Oral Oral Oral Oral  SpO2: 96% 95% 95% 96%  Weight:       Physical Exam Vitals and nursing note reviewed.  Constitutional:      General: She is not in acute distress.    Appearance: Normal appearance. She is not ill-appearing or toxic-appearing.  HENT:     Head: Normocephalic and atraumatic.  Cardiovascular:     Rate and Rhythm: Normal rate and regular rhythm.     Pulses: Normal pulses.          Radial pulses are 2+ on the right side and 2+ on the left side.       Dorsalis pedis pulses are 2+ on the right side and 2+ on the left side.     Heart sounds: Normal heart sounds, S1 normal and S2 normal. No murmur heard.   Pulmonary:     Effort: Pulmonary effort is normal. No respiratory distress.     Breath sounds: Normal breath sounds. No wheezing.  Abdominal:     General: There is no distension.     Palpations: There is no mass.     Tenderness: There is no abdominal tenderness.  Musculoskeletal:        General: Tenderness (Left ankle) present.     Right lower leg: 1+ Edema present.     Left lower leg: 1+ Edema present.     Comments: Mildly warm to the touch around Left ankle  Neurological:     Mental Status: She is alert. Mental status is at baseline.  Psychiatric:        Mood and Affect: Mood normal.        Behavior: Behavior normal.        Thought Content: Thought content normal.      Assessment/Plan:  Principal Problem:   Lower extremity cellulitis Active Problems:   Vitamin B12 deficiency   Schizoaffective disorder, bipolar type (HCC)    Hypothyroidism   Cellulitis   Brenda Murray is a 69 yr old female with PMHx of schizoaffective disorder, chronic back pain, and hypothyroidism, admitted for lower extremity swelling and pain due to non-purulent cellulitis.  Bilateral Lower Extremity Swelling 2/2 Non-purulent Cellulitis: Failed outpatient course of furosemide and doxycycline. X ray shows soft tissue swelling and heel spurs but no joint effusion. Has been afebrile over night. -Will stop IV cefazolin 2g q8 hours (day 2/3) on discharge -Start 4 days of Keflex 500 mg QID tomorrow -Trend cbc -Monitor vitals -Follow up blood cultures   Schizoaffective Disorder Bipolar Type: On lithium, duloxetine, ativan at home. Lithium level @ 1.05.  -Continue Lithium 300 mg daily -Continue Ativan 1 mg daily -Continue Duloxetine 60 mg daily   Chronic Normocytic anemia Appear to be at baseline. Hgb of 10.6 month ago. Hgb 10.0 and MCV 88 yesterday. Folate is 16.3 and B12 is 367 WNL. - Trend cbc   Hypothyroidism: On levothyroxine -Continue levothyroxine 100 mcg daily   DVT prophx: lovenox Diet: HH Code: Full   Prior to Admission Living Arrangement:  Home Anticipated Discharge Location: Home Barriers to Discharge: None Dispo: Anticipated discharge today.   Lauro Franklin, MD 07/21/2020, 6:55 AM Pager: [phone] After 5pm on weekdays and 1pm on weekends: On Call pager [phone]

## 2020-07-21 NOTE — Progress Notes (Signed)
Pt IV removed, catheter intact.Pt d/c education provided at bedside to pt and pt's son. Pt has all belongings, pt discharged via wheelchair with tech.

## 2020-07-21 NOTE — Progress Notes (Signed)
Physical Therapy Evaluation Patient Details Name: Brenda Murray MRN: [id number] DOB: 02-06-1951 Today's Date: 07/21/2020   History of Present Illness  This is a 69 year old female who states approximately 1 week ago she developed pain and redness in both her legs.  The pain has progressed to where she has difficulty walking due to the discomfort.  has a past medical history of Anxiety, Bipolar disorder (HCC), Chronic back pain, History of indigestion, and Hypothyroidism.  Clinical Impression   Pt admitted with above diagnosis. Lives alone in her apartment; son Denyse Amass lives close, and an Aide consisitently helps her M, W, F; Presents to PT with generalized weakness, LLE pain which limits weight bearing LLE; Practiced using RWto unweigh painful LLE in stance;  Pt currently with functional limitations due to the deficits listed below (see PT Problem List). Pt will benefit from skilled PT to increase their independence and safety with mobility to allow discharge to the venue listed below.       Follow Up Recommendations Home health PT (through [facility])    Equipment Recommendations  None recommended by PT    Recommendations for Other Services       Precautions / Restrictions Precautions Precautions: Fall      Mobility  Bed Mobility                  Transfers Overall transfer level: Needs assistance Equipment used: Rolling walker (2 wheeled) Transfers: Sit to/from Stand Sit to Stand: Min guard         General transfer comment: Cues for hand placement and safety  Ambulation/Gait Ambulation/Gait assistance: Min guard (with and without physical contact) Gait Distance (Feet): 120 Feet Assistive device: Rolling walker (2 wheeled) Gait Pattern/deviations: Step-through pattern;Decreased step length - right;Decreased step length - left Gait velocity: quite slow, but mostly because of distractibility   General Gait Details: Re-sized RW for optimal fit; Walked to bathroom, sink,  and then the hallway; became fatigued on teh way back, with eyes closing  Stairs            Wheelchair Mobility    Modified Rankin (Stroke Patients Only)       Balance     Sitting balance-Leahy Scale: Good       Standing balance-Leahy Scale: Fair                               Pertinent Vitals/Pain Pain Assessment: Faces Faces Pain Scale: Hurts little more Pain Location: LEs Pain Descriptors / Indicators: Sore Pain Intervention(s): Monitored during session    Home Living Family/patient expects to be discharged to:: Private residence Living Arrangements: Alone Available Help at Discharge: Personal care attendant;Family;Available PRN/intermittently Type of Home: Apartment Home Access: Level entry     Home Layout: One level Home Equipment: Grab bars - tub/shower;Hand held shower head;Bedside commode;Walker - 4 wheels      Prior Function Level of Independence: Needs assistance   Gait / Transfers Assistance Needed: walks with a rollator  ADL's / Homemaking Assistance Needed: assisted for housekeeping, transportation  Comments: [facility]-W-F, pt sponge bathes     Hand Dominance   Dominant Hand: Right    Extremity/Trunk Assessment   Upper Extremity Assessment Upper Extremity Assessment: Defer to OT evaluation    Lower Extremity Assessment Lower Extremity Assessment: Generalized weakness;LLE deficits/detail LLE Deficits / Details: Notable swelling L foot; Painful with weihgt bearing, but able to use RW to [place]  painful L LE in stance    Cervical / Trunk Assessment Cervical / Trunk Assessment: Other exceptions Cervical / Trunk Exceptions: obesity  Communication   Communication: No difficulties  Cognition Arousal/Alertness: Awake/alert Behavior During Therapy: WFL for tasks assessed/performed Overall Cognitive Status: No family/caregiver present to determine baseline cognitive functioning                                  General Comments: Pt initially confused, but became clearer as she awakened and walked about.      General Comments General comments (skin integrity, edema, etc.): Tells me sometimes she sits and pushes her rollator with her feet to get around    Exercises     Assessment/Plan    PT Assessment Patient needs continued PT services  PT Problem List Decreased strength;Decreased range of motion;Decreased activity tolerance;Decreased balance;Decreased mobility;Decreased coordination;Decreased cognition;Decreased knowledge of use of DME;Decreased safety awareness;Pain       PT Treatment Interventions DME instruction;Gait training;Stair training;Functional mobility training;Therapeutic activities;Therapeutic exercise;Balance training;Neuromuscular re-education;Cognitive remediation;Patient/family education    PT Goals (Current goals can be found in the Care Plan section)  Acute Rehab PT Goals Patient Stated Goal: get legs working better PT Goal Formulation: With patient Time For Goal Achievement: 08/04/20 Potential to Achieve Goals: Good    Frequency Min 3X/week   Barriers to discharge        Co-evaluation               AM-PAC PT "6 Clicks" Mobility  Outcome Measure Help needed turning from your back to your side while in a flat bed without using bedrails?: A Little Help needed moving from lying on your back to sitting on the side of a flat bed without using bedrails?: A Little Help needed moving to and from a bed to a chair (including a wheelchair)?: A Little Help needed standing up from a chair using your arms (e.g., wheelchair or bedside chair)?: A Little Help needed to walk in hospital room?: A Little Help needed climbing 3-5 steps with a railing? : A Lot 6 Click Score: 17    End of Session Equipment Utilized During Treatment: Gait belt Activity Tolerance: Patient tolerated treatment well Patient left: in chair;with call bell/phone within reach;with  chair alarm set Nurse Communication: Mobility status PT Visit Diagnosis: Other abnormalities of gait and mobility (R26.89)    Time: 1105-1130 PT Time Calculation (min) (ACUTE ONLY): 25 min   Charges:   PT Evaluation $PT Eval Moderate Complexity: 1 Mod PT Treatments $Gait Training: 8-22 mins        Van Clines, PT  Acute Rehabilitation Services Pager [phone] Office [phone]   Brenda Murray 07/21/2020, 2:43 PM

## 2020-07-24 LAB — CULTURE, BLOOD (ROUTINE X 2)
Culture: NO GROWTH
Culture: NO GROWTH
Special Requests: ADEQUATE
Special Requests: ADEQUATE

## 2020-07-30 ENCOUNTER — Other Ambulatory Visit: Payer: Self-pay | Admitting: Gerontology

## 2020-07-30 ENCOUNTER — Ambulatory Visit
Admission: RE | Admit: 2020-07-30 | Discharge: 2020-07-30 | Disposition: A | Discharge status: Home / Self Care | Payer: Medicare (Managed Care) | Source: Ambulatory Visit | Attending: Gerontology | Admitting: Gerontology

## 2020-07-30 DIAGNOSIS — R52 Pain, unspecified: Secondary | ICD-10-CM

## 2020-07-30 DIAGNOSIS — R609 Edema, unspecified: Secondary | ICD-10-CM

## 2020-11-07 ENCOUNTER — Emergency Department (HOSPITAL_COMMUNITY)
Admission: EM | Admit: 2020-11-07 | Discharge: 2020-11-08 | Disposition: A | Discharge status: Home / Self Care | Payer: Medicare (Managed Care) | Attending: Emergency Medicine | Admitting: Emergency Medicine

## 2020-11-07 ENCOUNTER — Other Ambulatory Visit: Payer: Self-pay

## 2020-11-07 ENCOUNTER — Encounter (HOSPITAL_COMMUNITY): Payer: Self-pay | Admitting: Emergency Medicine

## 2020-11-07 ENCOUNTER — Emergency Department (HOSPITAL_COMMUNITY): Payer: Medicare (Managed Care)

## 2020-11-07 DIAGNOSIS — Z20822 Contact with and (suspected) exposure to covid-19: Secondary | ICD-10-CM | POA: Insufficient documentation

## 2020-11-07 DIAGNOSIS — E039 Hypothyroidism, unspecified: Secondary | ICD-10-CM | POA: Diagnosis not present

## 2020-11-07 DIAGNOSIS — Z87891 Personal history of nicotine dependence: Secondary | ICD-10-CM | POA: Insufficient documentation

## 2020-11-07 DIAGNOSIS — R109 Unspecified abdominal pain: Secondary | ICD-10-CM | POA: Diagnosis present

## 2020-11-07 DIAGNOSIS — Z79899 Other long term (current) drug therapy: Secondary | ICD-10-CM | POA: Diagnosis not present

## 2020-11-07 DIAGNOSIS — Z96653 Presence of artificial knee joint, bilateral: Secondary | ICD-10-CM | POA: Insufficient documentation

## 2020-11-07 DIAGNOSIS — R197 Diarrhea, unspecified: Secondary | ICD-10-CM | POA: Diagnosis not present

## 2020-11-07 DIAGNOSIS — R1084 Generalized abdominal pain: Secondary | ICD-10-CM | POA: Diagnosis not present

## 2020-11-07 LAB — CBC
HCT: 34.6 % — ABNORMAL LOW (ref 36.0–46.0)
Hemoglobin: 10.5 g/dL — ABNORMAL LOW (ref 12.0–15.0)
MCH: 25.5 pg — ABNORMAL LOW (ref 26.0–34.0)
MCHC: 30.3 g/dL (ref 30.0–36.0)
MCV: 84.2 fL (ref 80.0–100.0)
Platelets: 299 10*3/uL (ref 150–400)
RBC: 4.11 MIL/uL (ref 3.87–5.11)
RDW: 15.4 % (ref 11.5–15.5)
WBC: 5.5 10*3/uL (ref 4.0–10.5)
nRBC: 0 % (ref 0.0–0.2)

## 2020-11-07 LAB — COMPREHENSIVE METABOLIC PANEL
ALT: 24 U/L (ref 0–44)
AST: 28 U/L (ref 15–41)
Albumin: 3.6 g/dL (ref 3.5–5.0)
Alkaline Phosphatase: 67 U/L (ref 38–126)
Anion gap: 9 (ref 5–15)
BUN: 8 mg/dL (ref 8–23)
CO2: 29 mmol/L (ref 22–32)
Calcium: 10.3 mg/dL (ref 8.9–10.3)
Chloride: 99 mmol/L (ref 98–111)
Creatinine, Ser: 0.69 mg/dL (ref 0.44–1.00)
GFR, Estimated: >60 mL/min (ref >60)
Glucose, Bld: 92 mg/dL (ref 70–99)
Potassium: 4.3 mmol/L (ref 3.5–5.1)
Sodium: 137 mmol/L (ref 135–145)
Total Bilirubin: 0.7 mg/dL (ref 0.3–1.2)
Total Protein: 8.1 g/dL (ref 6.5–8.1)

## 2020-11-07 LAB — URINALYSIS, ROUTINE W REFLEX MICROSCOPIC
Bilirubin Urine: NEGATIVE
Glucose, UA: NEGATIVE mg/dL
Hgb urine dipstick: NEGATIVE
Ketones, ur: NEGATIVE mg/dL
Leukocytes,Ua: NEGATIVE
Nitrite: NEGATIVE
Protein, ur: NEGATIVE mg/dL
Specific Gravity, Urine: 1.008 (ref 1.005–1.030)
pH: 8 (ref 5.0–8.0)

## 2020-11-07 LAB — RESP PANEL BY RT-PCR (FLU A&B, COVID) ARPGX2
Influenza A by PCR: NEGATIVE
Influenza B by PCR: NEGATIVE
SARS Coronavirus 2 by RT PCR: NEGATIVE

## 2020-11-07 LAB — LIPASE, BLOOD: Lipase: 22 U/L (ref 11–51)

## 2020-11-07 MED ORDER — SODIUM CHLORIDE 0.9 % IV BOLUS
500.0000 mL | Freq: Once | INTRAVENOUS | Status: AC
  Administered 2020-11-07: 500 mL via INTRAVENOUS

## 2020-11-07 MED ORDER — DICYCLOMINE HCL 20 MG PO TABS: 20.0000 mg | ORAL_TABLET | Freq: Two times a day (BID) | ORAL | 0 refills | Status: AC | PRN

## 2020-11-07 MED ORDER — SALINE SPRAY 0.65 % NA SOLN
1.0000 | Freq: Once | NASAL | Status: DC
  Filled 2020-11-07: qty 44

## 2020-11-07 MED ORDER — IOHEXOL 300 MG/ML  SOLN
100.0000 mL | Freq: Once | INTRAMUSCULAR | Status: AC | PRN
  Administered 2020-11-07: 100 mL via INTRAVENOUS

## 2020-11-07 MED ORDER — MORPHINE SULFATE (PF) 4 MG/ML IV SOLN
4.0000 mg | Freq: Once | INTRAVENOUS | Status: AC
  Administered 2020-11-07: 4 mg via INTRAVENOUS
  Filled 2020-11-07: qty 1

## 2020-11-07 MED ORDER — ONDANSETRON HCL 4 MG/2ML IJ SOLN
4.0000 mg | Freq: Once | INTRAMUSCULAR | Status: AC
  Administered 2020-11-07: 4 mg via INTRAVENOUS
  Filled 2020-11-07: qty 2

## 2020-11-07 NOTE — ED Notes (Signed)
Pt SPO2 dropped some after Morphine given pt placed on 2.5L [place]2 [place] and SPO2 upt to 93%.

## 2020-11-07 NOTE — ED Provider Notes (Signed)
[facility] EMERGENCY DEPARTMENT Provider Note   CSN: [id number] Arrival date & time: 11/07/20  6789     History Chief Complaint  Patient presents with  . Abdominal Pain  . Multiple Complaints    Brenda Murray is a 69 y.o. female.  Pt presents to the ED today with abdominal pain and diarrhea.  The pt is a very poor historian, but it sounds like she's had diarrhea for a month.  It is very hard to keep her on track with her history.  It seems like she has severe abdominal cramps, then diarrhea.  I asked her if she's had recent abx.  She said no then yes, but does not know which one and what it treated.          Past Medical History:  Diagnosis Date  . Anxiety   . Bipolar disorder (HCC)   . Chronic back pain   . History of indigestion   . Hypothyroidism     Patient Active Problem List   Diagnosis Date Noted  . Lower extremity cellulitis 07/20/2020  . Hypothyroidism 07/20/2020  . Cellulitis 07/20/2020  . Schizoaffective disorder, bipolar type (HCC) 11/10/2017  . Hoarseness 11/09/2016  . Parotid swelling 03/22/2016  . Nausea and vomiting 11/04/2014  . Hypothyroid 11/04/2014  . Metabolic alkalosis 11/04/2014  . Hypokalemia 11/04/2014  . Knee joint replaced by other means 07/16/2014  . Confusion 07/11/2013  . Depressive disorder, not elsewhere classified 07/11/2013  . Anxiety 07/11/2013  . Vitamin B12 deficiency 06/28/2013  . Gastroesophageal reflux disease without esophagitis 06/28/2013  . Anal pruritus 06/28/2013    Past Surgical History:  Procedure Laterality Date  . carpel tunnel    . ESOPHAGOGASTRODUODENOSCOPY N/A 11/06/2014   Procedure: ESOPHAGOGASTRODUODENOSCOPY (EGD);  Surgeon: Charna Elizabeth, MD;  Location: [facility] ENDOSCOPY;  Service: Endoscopy;  Laterality: N/A;  . JOINT REPLACEMENT     bilat knee  . TONSILLECTOMY    . TOTAL KNEE REVISION  05/01/2012   Procedure: TOTAL KNEE REVISION;  Surgeon: Raymon Mutton, MD;  Location: [facility] OR;   Service: Orthopedics;  Laterality: Right;     OB History   No obstetric history on file.     Family History  Problem Relation Age of Onset  . Breast cancer Neg Hx     Social History   Tobacco Use  . Smoking status: Former Games developer  . Smokeless tobacco: Never Used  Vaping Use  . Vaping Use: Never used  Substance Use Topics  . Alcohol use: No  . Drug use: No    Home Medications Prior to Admission medications   Medication Sig Start Date End Date Taking? Authorizing Provider  acetaminophen (TYLENOL) 650 MG CR tablet Take 1,300 mg by mouth every 12 (twelve) hours.    [provider]  antiseptic oral rinse (BIOTENE) LIQD 15 mLs by Mouth Rinse route as needed for dry mouth.    [provider]  Carboxymethylcell-Hypromellose (GENTEAL OP) Place 1 drop into both eyes 2 (two) times daily.    [provider]  dicyclomine (BENTYL) 20 MG tablet Take 1 tablet (20 mg total) by mouth 2 (two) times daily as needed (abdominal pain). 11/07/20   Jacalyn Lefevre, MD  DULoxetine (CYMBALTA) 60 MG capsule Take 60 mg by mouth at bedtime.  12/10/16   [provider]  ergocalciferol (VITAMIN D2) 1.25 MG (50000 UT) capsule Take 50,000 Units by mouth once a week. Patient not taking: Reported on 07/20/2020    [provider]  famotidine (PEPCID) 20 MG tablet Take 20 mg by mouth daily.    [provider]  fluticasone (FLONASE) 50 MCG/ACT nasal spray Place 1 spray into both nostrils daily.    [provider]  guaiFENesin (ROBITUSSIN) 100 MG/5ML liquid Take 200 mg by mouth every 4 (four) hours as needed for cough.    [provider]  levothyroxine (SYNTHROID, LEVOTHROID) 100 MCG tablet Take 100 mcg by mouth daily before breakfast. Patient not taking: Reported on 07/20/2020    [provider]  lithium 300 MG tablet Take 300 mg by mouth in the morning and at bedtime.    [provider]  LORazepam (ATIVAN) 1 MG tablet Take 1 mg  by mouth at bedtime. 07/23/17   [provider]  Melatonin 5 MG CAPS Take 5 mg by mouth at bedtime.    [provider]  Multiple Vitamins-Minerals (PRESERVISION AREDS 2+MULTI VIT PO) Take 1 capsule by mouth daily.    [provider]  NEURONTIN 300 MG capsule Take 300 mg by mouth 2 (two) times daily.  01/16/20   [provider]  OLANZapine (ZYPREXA) 10 MG tablet Take 10 mg by mouth in the morning and at bedtime.    [provider]  sodium chloride (OCEAN) 0.65 % SOLN nasal spray Place 1 spray into both nostrils as needed for congestion. 12/06/17   Georgetta Haber, NP  traZODone (DESYREL) 50 MG tablet Take 50 mg by mouth at bedtime.    [provider]    Allergies    Patient has no known allergies.  Review of Systems   Review of Systems  Gastrointestinal: Positive for abdominal pain and diarrhea.  All other systems reviewed and are negative.   Physical Exam Updated Vital Signs BP 103/65   Pulse 78   Temp 98.5 F (36.9 C) (Oral)   Resp (!) 21   SpO2 100%   Physical Exam Vitals and nursing note reviewed.  Constitutional:      Appearance: She is well-developed.  HENT:     Head: Normocephalic and atraumatic.     Mouth/Throat:     Mouth: Mucous membranes are moist.     Pharynx: Oropharynx is clear.  Eyes:     Extraocular Movements: Extraocular movements intact.     Pupils: Pupils are equal, round, and reactive to light.  Cardiovascular:     Rate and Rhythm: Normal rate and regular rhythm.  Pulmonary:     Effort: Pulmonary effort is normal.     Breath sounds: Normal breath sounds.  Abdominal:     General: Abdomen is flat. Bowel sounds are normal.     Palpations: Abdomen is soft.     Tenderness: There is generalized abdominal tenderness.  Skin:    General: Skin is warm.     Capillary Refill: Capillary refill takes less than 2 seconds.  Neurological:     General: No focal deficit present.     Mental Status: She is alert and  oriented to person, place, and time.  Psychiatric:        Mood and Affect: Mood normal.        Behavior: Behavior normal.     ED Results / Procedures / Treatments   Labs (all labs ordered are listed, but only abnormal results are displayed) Labs Reviewed  CBC - Abnormal; Notable for the following components:      Result Value   Hemoglobin 10.5 (*)    HCT 34.6 (*)    MCH 25.5 (*)  All other components within normal limits  GASTROINTESTINAL PANEL BY PCR, STOOL (REPLACES STOOL CULTURE)  C DIFFICILE (CDIFF) QUICK SCRN (NO PCR REFLEX)  RESP PANEL BY RT-PCR (FLU A&B, COVID) ARPGX2  LIPASE, BLOOD  COMPREHENSIVE METABOLIC PANEL  URINALYSIS, ROUTINE W REFLEX MICROSCOPIC    EKG None  Radiology CT ABDOMEN PELVIS W CONTRAST  Result Date: 11/07/2020 CLINICAL DATA:  Buttock and abdominal pain. EXAM: CT ABDOMEN AND PELVIS WITH CONTRAST TECHNIQUE: Multidetector CT imaging of the abdomen and pelvis was performed using the standard protocol following bolus administration of intravenous contrast. CONTRAST:  100 mL OMNIPAQUE IOHEXOL 300 MG/ML  SOLN COMPARISON:  CT abdomen and pelvis 11/04/2014. FINDINGS: Lower chest: No pleural or pericardial effusion. Mild left basilar atelectasis. Hepatobiliary: No focal liver abnormality is seen. No gallstones, gallbladder wall thickening, or biliary dilatation. Fatty infiltration of the liver noted. Pancreas: Unremarkable. No pancreatic ductal dilatation or surrounding inflammatory changes. Spleen: Normal in size without focal abnormality. Adrenals/Urinary Tract: The adrenal glands appear normal. A new simple cyst in the lower pole of the right kidney measures 1.5 cm. Dilated calyx upper pole left kidney is unchanged. Ureters and urinary bladder are unremarkable. Stomach/Bowel: Stomach is within normal limits. Appendix appears normal. No evidence of bowel wall thickening, distention, or inflammatory changes. Descending and sigmoid colon diverticulosis is noted.  Small hiatal hernia is seen. Vascular/Lymphatic: Aortic atherosclerosis. No enlarged abdominal or pelvic lymph nodes. Reproductive: Uterus and bilateral adnexa are unremarkable. Other: None. Musculoskeletal: No acute or focal bony abnormality. Lumbar degenerative change is seen. IMPRESSION: No acute abnormality or finding to explain the patient's symptoms. Fatty infiltration of the liver. Diverticulosis without diverticulitis. Small hiatal hernia. Aortic Atherosclerosis (ICD10-I70.0). Electronically Signed   By: Drusilla Kanner M.D.   On: 11/07/2020 14:16    Procedures Procedures (including critical care time)  Medications Ordered in ED Medications  sodium chloride (OCEAN) 0.65 % nasal spray 1 spray (has no administration in time range)  morphine 4 MG/ML injection 4 mg (4 mg Intravenous Given 11/07/20 1225)  ondansetron (ZOFRAN) injection 4 mg (4 mg Intravenous Given 11/07/20 1225)  sodium chloride 0.9 % bolus 500 mL (500 mLs Intravenous New Bag/Given 11/07/20 1225)  iohexol (OMNIPAQUE) 300 MG/ML solution 100 mL (100 mLs Intravenous Contrast Given 11/07/20 1353)    ED Course  I have reviewed the triage vital signs and the nursing notes.  Pertinent labs & imaging results that were available during my care of the patient were reviewed by me and considered in my medical decision making (see chart for details).    MDM Rules/Calculators/A&P                          Pt is feeling better.  She also said that everything has been smelling the same and is worried about that.  I will do a covid swab.  She is to f/u with ENT if covid neg and sx persist.  Her abd pain work up is negative.    Pt is stable for d/c.  Return if worse.   Final Clinical Impression(s) / ED Diagnoses Final diagnoses:  Generalized abdominal pain    Rx / DC Orders ED Discharge Orders         Ordered    dicyclomine (BENTYL) 20 MG tablet  2 times daily PRN        11/07/20 1424           Jacalyn Lefevre,  MD 11/07/20 1427

## 2020-11-07 NOTE — ED Notes (Signed)
Patient transported to CT 

## 2020-11-07 NOTE — ED Triage Notes (Addendum)
Patient here with multiple complaints.  Patient having buttock pain, abdominal pain and bilateral knee pain.  She states that it has been going on for awhile.  She also states that he tongue is sore.  She is a patient of [facility].  They told her to come to the ED.

## 2020-12-19 ENCOUNTER — Other Ambulatory Visit: Payer: Self-pay | Admitting: Family Medicine

## 2020-12-19 DIAGNOSIS — Z1231 Encounter for screening mammogram for malignant neoplasm of breast: Secondary | ICD-10-CM

## 2021-03-04 ENCOUNTER — Ambulatory Visit
Admission: RE | Admit: 2021-03-04 | Discharge: 2021-03-04 | Disposition: A | Discharge status: Home / Self Care | Payer: Medicare (Managed Care) | Source: Ambulatory Visit | Attending: Family Medicine | Admitting: Family Medicine

## 2021-03-04 ENCOUNTER — Other Ambulatory Visit: Payer: Self-pay

## 2021-03-04 DIAGNOSIS — Z1231 Encounter for screening mammogram for malignant neoplasm of breast: Secondary | ICD-10-CM

## 2021-07-04 IMAGING — CR DG CHEST 2V
2 series · 2 of 2 positions shown · non-contrast
Comparison: Chest radiograph 09/14/2019, additional priors

CLINICAL DATA: Follow-up pneumonia.

EXAM:
CHEST - 2 VIEW

[w chest pa *]
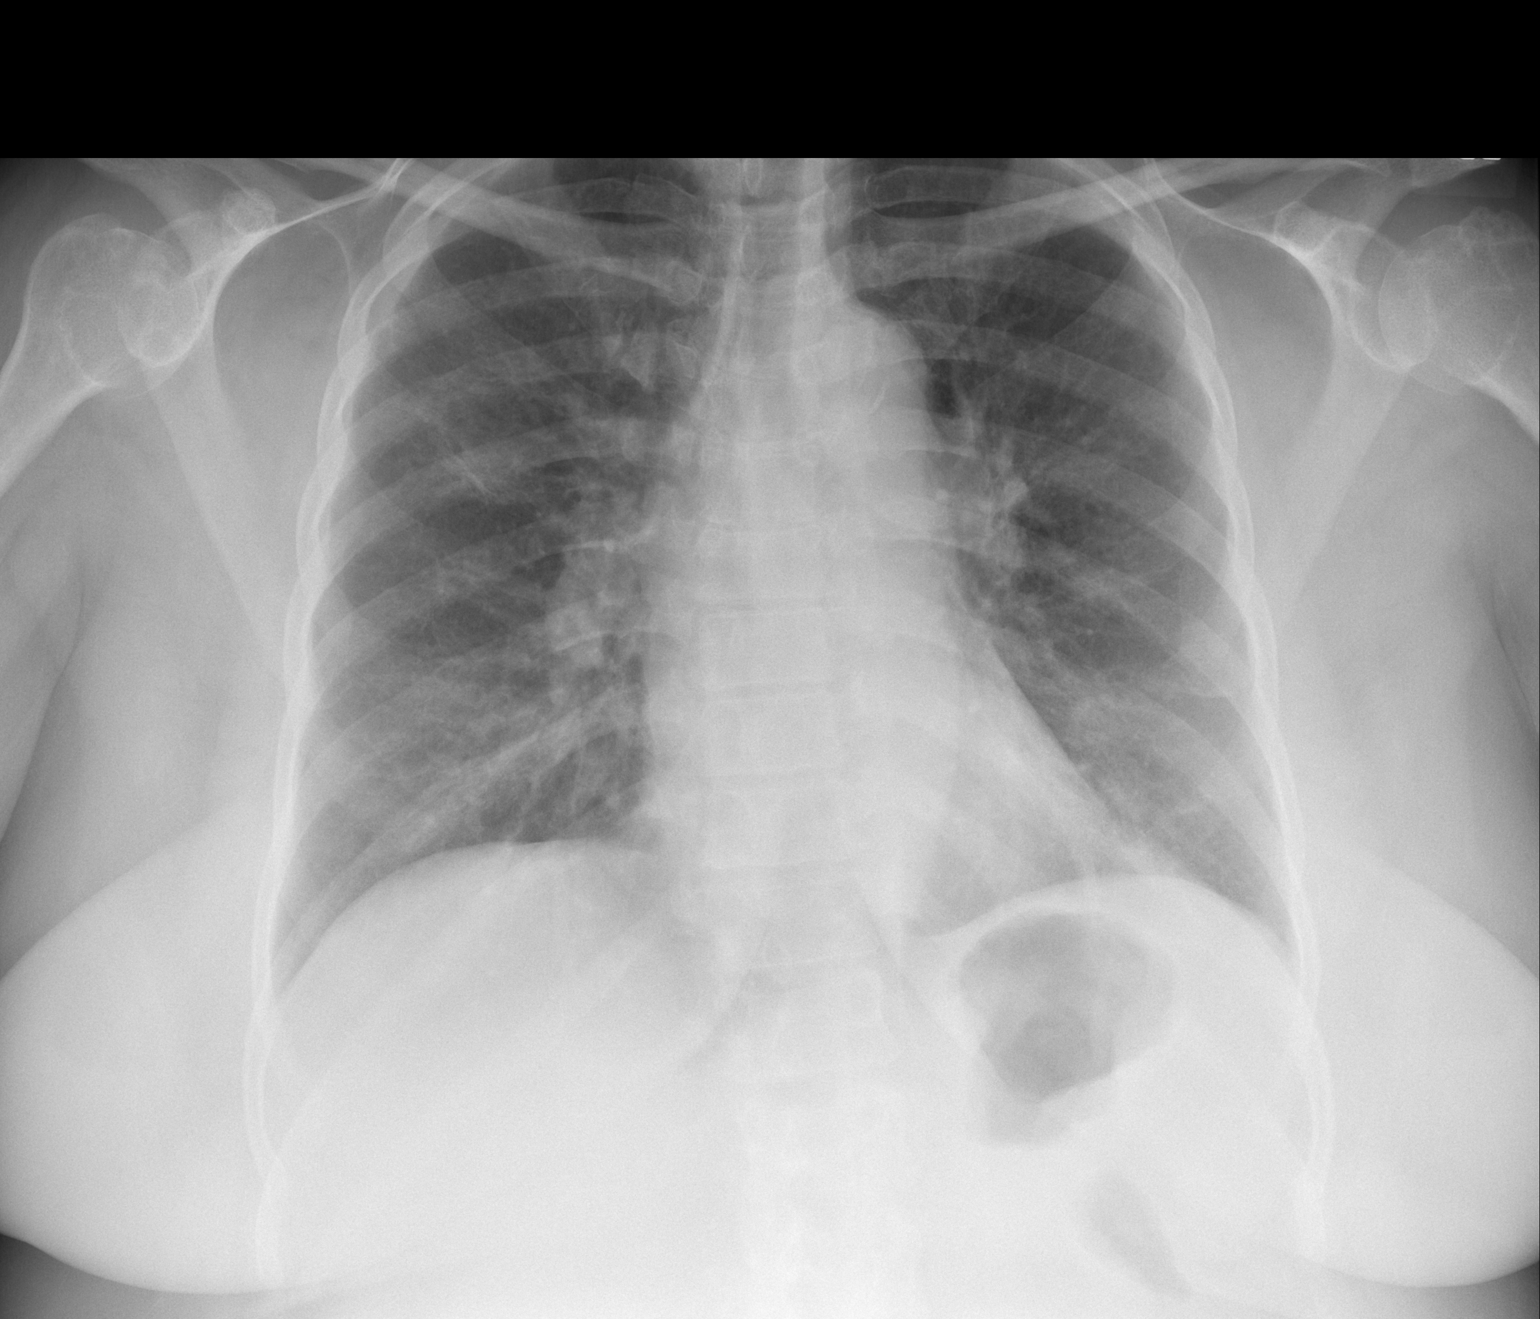

[w chest lat]
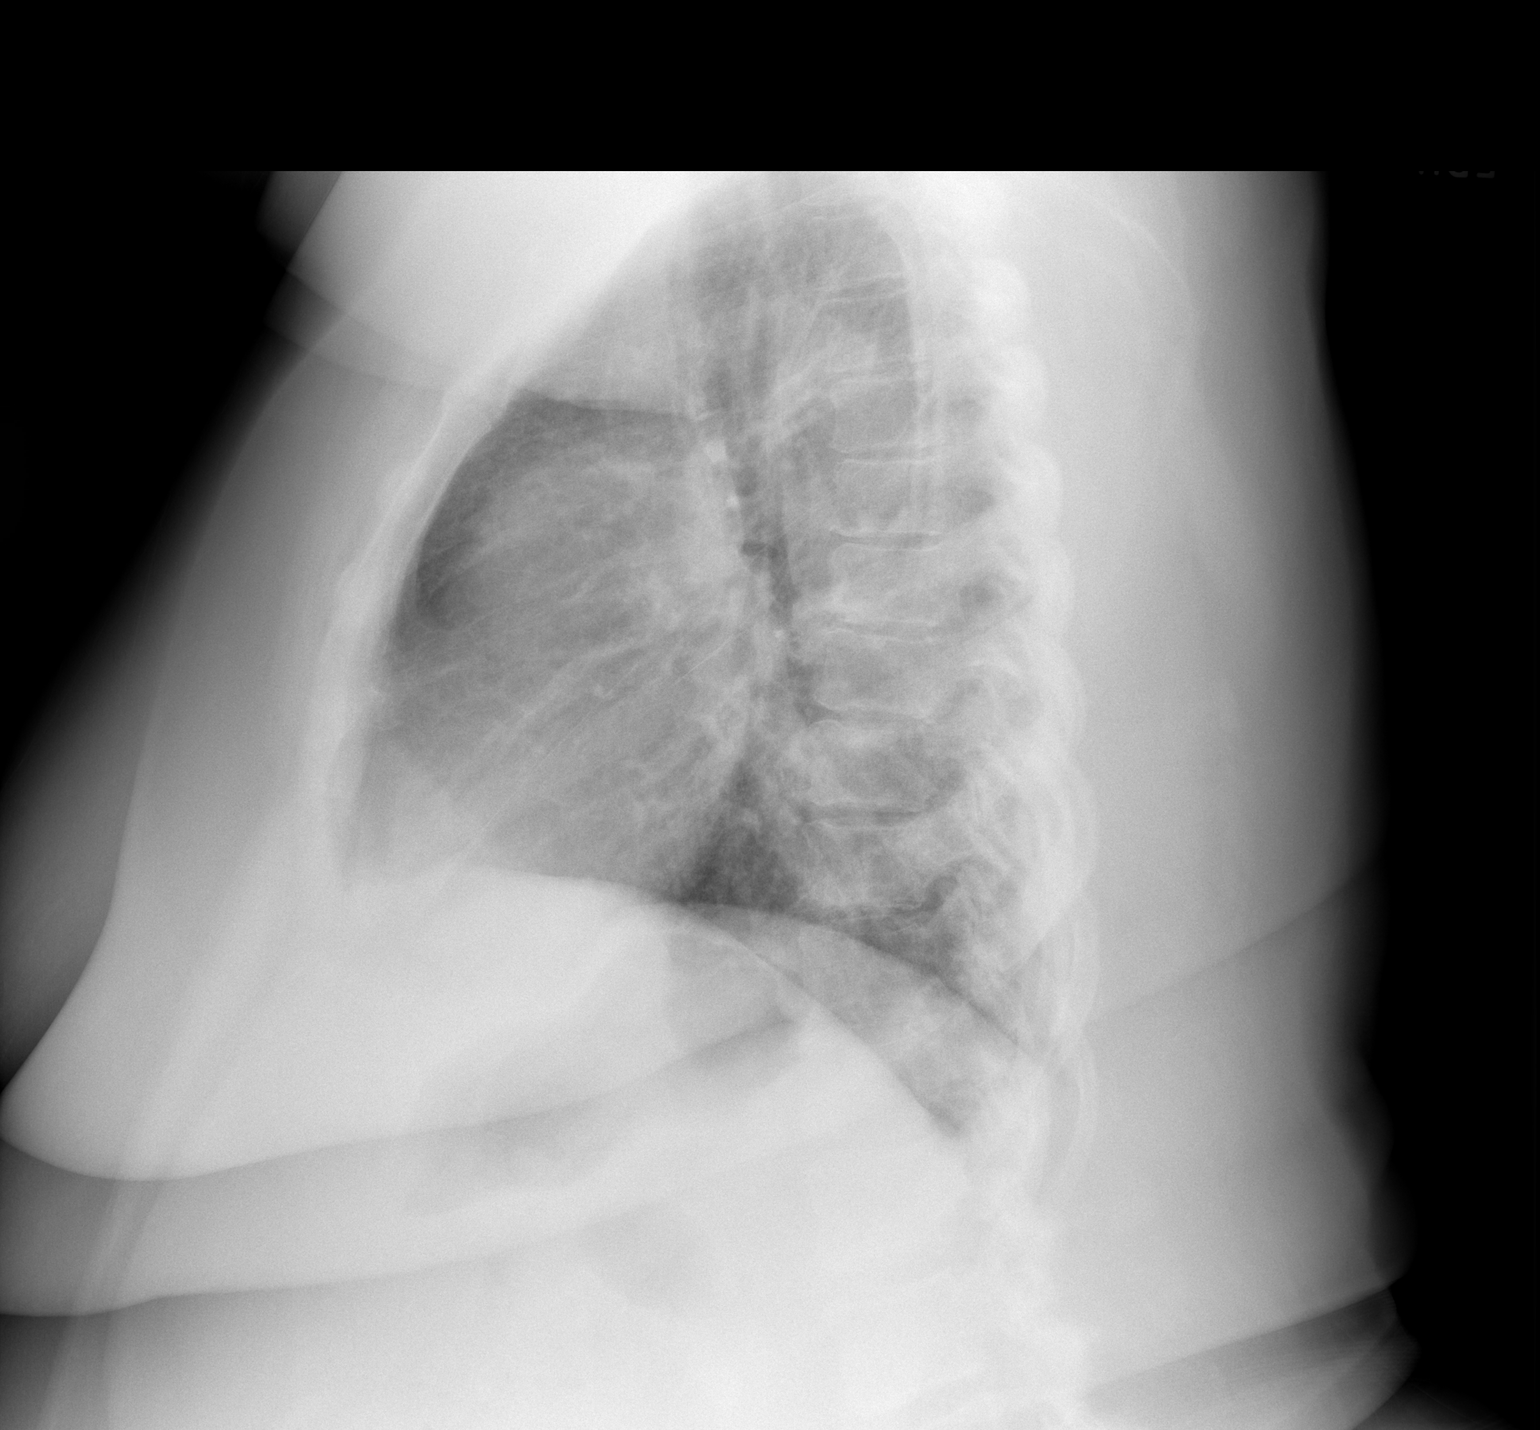

[2 of 2 positions shown; findings below may reference images not displayed]

FINDINGS: Previous right lung base opacity has resolved. No new focal airspace
disease. Chronic bronchial thickening that is stable from prior.
Streaky right upper lobe scarring is unchanged. Normal heart size
and mediastinal contours. No pleural effusion or pneumothorax. No
pulmonary edema. No acute osseous abnormalities.
IMPRESSION: 1. Resolved right lung base opacity from prior exam.
2. No new abnormalities.  Stable right suprahilar scarring.

## 2021-08-13 ENCOUNTER — Ambulatory Visit (HOSPITAL_COMMUNITY)
Admission: EM | Admit: 2021-08-13 | Discharge: 2021-08-13 | Disposition: A | Discharge status: Home / Self Care | Payer: Medicare (Managed Care) | Attending: Internal Medicine | Admitting: Internal Medicine

## 2021-08-13 ENCOUNTER — Other Ambulatory Visit: Payer: Self-pay

## 2021-08-13 ENCOUNTER — Encounter (HOSPITAL_COMMUNITY): Payer: Self-pay | Admitting: *Deleted

## 2021-08-13 DIAGNOSIS — M5431 Sciatica, right side: Secondary | ICD-10-CM | POA: Diagnosis not present

## 2021-08-13 MED ORDER — DICLOFENAC SODIUM 1 % EX GEL: 4.0000 g | Freq: Four times a day (QID) | CUTANEOUS | 0 refills | Status: AC

## 2021-08-13 NOTE — ED Triage Notes (Signed)
Rt leg pain started yesterday

## 2021-08-13 NOTE — Discharge Instructions (Addendum)
Apply heating pad to the lower back and a 15-minute on-15 minutes off cycle 4 times daily Gentle range of motion exercises Take Tylenol as needed for pain Return to urgent care to be reevaluated if symptoms worsen.

## 2021-08-14 NOTE — ED Provider Notes (Signed)
[facility]    CSN: [id number] Arrival date & time: 08/13/21  1853      History   Chief Complaint Chief Complaint  Patient presents with   Leg Pain    HPI Brenda Murray is a 70 y.o. female comes to the urgent care with right-sided lower back/hip pain of 1 day duration.  Patient has a history of sciatica.  She describes the pain as severe, sharp with radiation into the right leg.  Patient denies any falls or trauma to the back or hip.  She is able to bear weight on the right leg.  She has not tried any over-the-counter medication.  She denies any numbness or tingling in the right leg.  No weakness in the right leg.   HPI  Past Medical History:  Diagnosis Date   Anxiety    Bipolar disorder (HCC)    Chronic back pain    History of indigestion    Hypothyroidism     Patient Active Problem List   Diagnosis Date Noted   Lower extremity cellulitis 07/20/2020   Hypothyroidism 07/20/2020   Cellulitis 07/20/2020   Schizoaffective disorder, bipolar type (HCC) 11/10/2017   Hoarseness 11/09/2016   Parotid swelling 03/22/2016   Nausea and vomiting 11/04/2014   Hypothyroid 11/04/2014   Metabolic alkalosis 11/04/2014   Hypokalemia 11/04/2014   Knee joint replaced by other means 07/16/2014   Confusion 07/11/2013   Depressive disorder, not elsewhere classified 07/11/2013   Anxiety 07/11/2013   Vitamin B12 deficiency 06/28/2013   Gastroesophageal reflux disease without esophagitis 06/28/2013   Anal pruritus 06/28/2013    Past Surgical History:  Procedure Laterality Date   carpel tunnel     ESOPHAGOGASTRODUODENOSCOPY N/A 11/06/2014   Procedure: ESOPHAGOGASTRODUODENOSCOPY (EGD);  Surgeon: Charna Elizabeth, MD;  Location: [facility] ENDOSCOPY;  Service: Endoscopy;  Laterality: N/A;   JOINT REPLACEMENT     bilat knee   TONSILLECTOMY     TOTAL KNEE REVISION  05/01/2012   Procedure: TOTAL KNEE REVISION;  Surgeon: Raymon Mutton, MD;  Location: [facility] OR;  Service: Orthopedics;   Laterality: Right;    OB History   No obstetric history on file.      Home Medications    Prior to Admission medications   Medication Sig Start Date End Date Taking? Authorizing Provider  diclofenac Sodium (VOLTAREN) 1 % GEL Apply 4 g topically 4 (four) times daily. 08/13/21  Yes Luann Aspinwall, Britta Mccreedy, MD  acetaminophen (TYLENOL) 650 MG CR tablet Take 1,300 mg by mouth every 12 (twelve) hours.    [provider]  antiseptic oral rinse (BIOTENE) LIQD 15 mLs by Mouth Rinse route as needed for dry mouth.    [provider]  Carboxymethylcell-Hypromellose (GENTEAL OP) Place 1 drop into both eyes 2 (two) times daily.    [provider]  dicyclomine (BENTYL) 20 MG tablet Take 1 tablet (20 mg total) by mouth 2 (two) times daily as needed (abdominal pain). 11/07/20   Jacalyn Lefevre, MD  DULoxetine (CYMBALTA) 60 MG capsule Take 60 mg by mouth at bedtime.  12/10/16   [provider]  famotidine (PEPCID) 20 MG tablet Take 20 mg by mouth daily.    [provider]  fluticasone (FLONASE) 50 MCG/ACT nasal spray Place 1 spray into both nostrils daily.    [provider]  guaiFENesin (ROBITUSSIN) 100 MG/5ML liquid Take 200 mg by mouth every 4 (four) hours as needed for cough.    [provider]  lithium 300 MG tablet Take  300 mg by mouth in the morning and at bedtime.    [provider]  LORazepam (ATIVAN) 1 MG tablet Take 1 mg by mouth at bedtime. 07/23/17   [provider]  Melatonin 5 MG CAPS Take 5 mg by mouth at bedtime.    [provider]  Multiple Vitamins-Minerals (PRESERVISION AREDS 2+MULTI VIT PO) Take 1 capsule by mouth daily.    [provider]  NEURONTIN 300 MG capsule Take 300 mg by mouth 2 (two) times daily.  01/16/20   [provider]  OLANZapine (ZYPREXA) 10 MG tablet Take 10 mg by mouth in the morning and at bedtime.    [provider]  sodium chloride (OCEAN) 0.65 % SOLN nasal  spray Place 1 spray into both nostrils as needed for congestion. 12/06/17   Georgetta Haber, NP  traZODone (DESYREL) 50 MG tablet Take 50 mg by mouth at bedtime.    [provider]    Family History Family History  Problem Relation Age of Onset   Breast cancer Neg Hx     Social History Social History   Tobacco Use   Smoking status: Former   Smokeless tobacco: Never  Building services engineer Use: Never used  Substance Use Topics   Alcohol use: No   Drug use: No     Allergies   Patient has no known allergies.   Review of Systems Review of Systems  Respiratory: Negative.    Musculoskeletal:  Positive for back pain. Negative for neck pain and neck stiffness.  Skin: Negative.   Neurological: Negative.     Physical Exam Triage Vital Signs ED Triage Vitals  Enc Vitals Group     BP 08/13/21 1925 118/75     Pulse Rate 08/13/21 1925 81     Resp --      Temp 08/13/21 1925 98.6 F (37 C)     Temp src --      SpO2 08/13/21 1925 100 %     Weight --      Height --      Head Circumference --      Peak Flow --      Pain Score 08/13/21 1924 10     Pain Loc --      Pain Edu? --      Excl. in GC? --    No data found.  Updated Vital Signs BP 118/75   Pulse 81   Temp 98.6 F (37 C)   SpO2 100%   Visual Acuity Right Eye Distance:   Left Eye Distance:   Bilateral Distance:    Right Eye Near:   Left Eye Near:    Bilateral Near:     Physical Exam Vitals and nursing note reviewed.  Constitutional:      General: She is not in acute distress.    Appearance: She is not ill-appearing.  Cardiovascular:     Rate and Rhythm: Normal rate.  Pulmonary:     Effort: Pulmonary effort is normal.     Breath sounds: Normal breath sounds.  Musculoskeletal:        General: No swelling or tenderness. Normal range of motion.  Skin:    Capillary Refill: Capillary refill takes less than 2 seconds.  Neurological:     Mental Status: She is alert.     Cranial Nerves: No  cranial nerve deficit.     Sensory: No sensory deficit.     Motor: No weakness.  UC Treatments / Results  Labs (all labs ordered are listed, but only abnormal results are displayed) Labs Reviewed - No data to display  EKG   Radiology No results found.  Procedures Procedures (including critical care time)  Medications Ordered in UC Medications - No data to display  Initial Impression / Assessment and Plan / UC Course  I have reviewed the triage vital signs and the nursing notes.  Pertinent labs & imaging results that were available during my care of the patient were reviewed by me and considered in my medical decision making (see chart for details).     1.  Right-sided sciatica: Heating pad use is advised Tylenol as needed for pain Voltaren gel If symptoms worsen please return to urgent care to be reevaluated. Final Clinical Impressions(s) / UC Diagnoses   Final diagnoses:  Sciatica of right side     Discharge Instructions      Apply heating pad to the lower back and a 15-minute on-15 minutes off cycle 4 times daily Gentle range of motion exercises Take Tylenol as needed for pain Return to urgent care to be reevaluated if symptoms worsen.   ED Prescriptions     Medication Sig Dispense Auth. Provider   diclofenac Sodium (VOLTAREN) 1 % GEL Apply 4 g topically 4 (four) times daily. 350 g Thalia Turkington, Britta Mccreedy, MD      PDMP not reviewed this encounter.   Merrilee Jansky, MD 08/14/21 [phone]

## 2021-10-24 ENCOUNTER — Other Ambulatory Visit: Payer: Self-pay

## 2021-10-24 ENCOUNTER — Ambulatory Visit (HOSPITAL_COMMUNITY)
Admission: EM | Admit: 2021-10-24 | Discharge: 2021-10-24 | Disposition: A | Discharge status: Home / Self Care | Payer: Medicare (Managed Care) | Attending: Student | Admitting: Student

## 2021-10-24 ENCOUNTER — Encounter (HOSPITAL_COMMUNITY): Payer: Self-pay | Admitting: *Deleted

## 2021-10-24 DIAGNOSIS — Z1152 Encounter for screening for COVID-19: Secondary | ICD-10-CM | POA: Insufficient documentation

## 2021-10-24 DIAGNOSIS — J069 Acute upper respiratory infection, unspecified: Secondary | ICD-10-CM | POA: Diagnosis present

## 2021-10-24 LAB — SARS CORONAVIRUS 2 (TAT 6-24 HRS): SARS Coronavirus 2: NEGATIVE

## 2021-10-24 MED ORDER — PREDNISONE 20 MG PO TABS: 20.0000 mg | ORAL_TABLET | Freq: Every day | ORAL | 0 refills | Status: AC

## 2021-10-24 NOTE — ED Notes (Signed)
Family called and reported Pt has been DC home . Pt is ready to go. Pt was rolled out to front lobby to wait for family.

## 2021-10-24 NOTE — ED Provider Notes (Signed)
[facility]    CSN: [id number] Arrival date & time: 10/24/21  1317      History   Chief Complaint Chief Complaint  Patient presents with   Cough   Nasal Congestion    HPI Brenda Murray is a 70 y.o. female presenting with cough and congestion x3 days. Medical history noncontributory. Describes 3 days of cough and congestion. Cough is nonproductive. Denies increase in baseline SOB, denies history of pulm ds, does not use inhalers. Denies CP, dizziness. Denies fevers/chills, myalgais, weakness. Denies n/v/d/c/abd pain. Denies sick contacts. Has tried OTC medications without relief   HPI  Past Medical History:  Diagnosis Date   Anxiety    Bipolar disorder (HCC)    Chronic back pain    History of indigestion    Hypothyroidism     Patient Active Problem List   Diagnosis Date Noted   Lower extremity cellulitis 07/20/2020   Hypothyroidism 07/20/2020   Cellulitis 07/20/2020   Schizoaffective disorder, bipolar type (HCC) 11/10/2017   Hoarseness 11/09/2016   Parotid swelling 03/22/2016   Nausea and vomiting 11/04/2014   Hypothyroid 11/04/2014   Metabolic alkalosis 11/04/2014   Hypokalemia 11/04/2014   Knee joint replaced by other means 07/16/2014   Confusion 07/11/2013   Depressive disorder, not elsewhere classified 07/11/2013   Anxiety 07/11/2013   Vitamin B12 deficiency 06/28/2013   Gastroesophageal reflux disease without esophagitis 06/28/2013   Anal pruritus 06/28/2013    Past Surgical History:  Procedure Laterality Date   carpel tunnel     ESOPHAGOGASTRODUODENOSCOPY N/A 11/06/2014   Procedure: ESOPHAGOGASTRODUODENOSCOPY (EGD);  Surgeon: Charna Elizabeth, MD;  Location: [facility] ENDOSCOPY;  Service: Endoscopy;  Laterality: N/A;   JOINT REPLACEMENT     bilat knee   TONSILLECTOMY     TOTAL KNEE REVISION  05/01/2012   Procedure: TOTAL KNEE REVISION;  Surgeon: Raymon Mutton, MD;  Location: [facility] OR;  Service: Orthopedics;  Laterality: Right;    OB History   No  obstetric history on file.      Home Medications    Prior to Admission medications   Medication Sig Start Date End Date Taking? Authorizing Provider  predniSONE (DELTASONE) 20 MG tablet Take 1 tablet (20 mg total) by mouth daily for 5 days. 10/24/21 10/29/21 Yes Rhys Martini, PA-C  acetaminophen (TYLENOL) 650 MG CR tablet Take 1,300 mg by mouth every 12 (twelve) hours.    [provider]  antiseptic oral rinse (BIOTENE) LIQD 15 mLs by Mouth Rinse route as needed for dry mouth.    [provider]  Carboxymethylcell-Hypromellose (GENTEAL OP) Place 1 drop into both eyes 2 (two) times daily.    [provider]  diclofenac Sodium (VOLTAREN) 1 % GEL Apply 4 g topically 4 (four) times daily. 08/13/21   Lamptey, Britta Mccreedy, MD  dicyclomine (BENTYL) 20 MG tablet Take 1 tablet (20 mg total) by mouth 2 (two) times daily as needed (abdominal pain). 11/07/20   Jacalyn Lefevre, MD  DULoxetine (CYMBALTA) 60 MG capsule Take 60 mg by mouth at bedtime.  12/10/16   [provider]  famotidine (PEPCID) 20 MG tablet Take 20 mg by mouth daily.    [provider]  fluticasone (FLONASE) 50 MCG/ACT nasal spray Place 1 spray into both nostrils daily.    [provider]  guaiFENesin (ROBITUSSIN) 100 MG/5ML liquid Take 200 mg by mouth every 4 (four) hours as needed for cough.    [provider]  lithium 300 MG tablet Take 300 mg by  mouth in the morning and at bedtime.    [provider]  LORazepam (ATIVAN) 1 MG tablet Take 1 mg by mouth at bedtime. 07/23/17   [provider]  Melatonin 5 MG CAPS Take 5 mg by mouth at bedtime.    [provider]  Multiple Vitamins-Minerals (PRESERVISION AREDS 2+MULTI VIT PO) Take 1 capsule by mouth daily.    [provider]  NEURONTIN 300 MG capsule Take 300 mg by mouth 2 (two) times daily.  01/16/20   [provider]  OLANZapine (ZYPREXA) 10 MG tablet Take 10 mg by mouth in the morning  and at bedtime.    [provider]  sodium chloride (OCEAN) 0.65 % SOLN nasal spray Place 1 spray into both nostrils as needed for congestion. 12/06/17   Georgetta Haber, NP  traZODone (DESYREL) 50 MG tablet Take 50 mg by mouth at bedtime.    [provider]    Family History Family History  Problem Relation Age of Onset   Breast cancer Neg Hx     Social History Social History   Tobacco Use   Smoking status: Former   Smokeless tobacco: Never  Building services engineer Use: Never used  Substance Use Topics   Alcohol use: No   Drug use: No     Allergies   Patient has no known allergies.   Review of Systems Review of Systems  Constitutional:  Negative for appetite change, chills and fever.  HENT:  Positive for congestion. Negative for ear pain, rhinorrhea, sinus pressure, sinus pain and sore throat.   Eyes:  Negative for redness and visual disturbance.  Respiratory:  Positive for cough. Negative for chest tightness, shortness of breath and wheezing.   Cardiovascular:  Negative for chest pain and palpitations.  Gastrointestinal:  Negative for abdominal pain, constipation, diarrhea, nausea and vomiting.  Genitourinary:  Negative for dysuria, frequency and urgency.  Musculoskeletal:  Negative for myalgias.  Neurological:  Negative for dizziness, weakness and headaches.  Psychiatric/Behavioral:  Negative for confusion.   All other systems reviewed and are negative.   Physical Exam Triage Vital Signs ED Triage Vitals  Enc Vitals Group     BP 10/24/21 1502 137/85     Pulse Rate 10/24/21 1502 86     Resp 10/24/21 1502 20     Temp 10/24/21 1502 99.1 F (37.3 C)     Temp src --      SpO2 10/24/21 1502 92 %     Weight --      Height --      Head Circumference --      Peak Flow --      Pain Score 10/24/21 1459 0     Pain Loc --      Pain Edu? --      Excl. in GC? --    No data found.  Updated Vital Signs BP 137/85   Pulse 86   Temp 99.1 F (37.3 C)    Resp 20   SpO2 92%   Visual Acuity Right Eye Distance:   Left Eye Distance:   Bilateral Distance:    Right Eye Near:   Left Eye Near:    Bilateral Near:     Physical Exam Vitals reviewed.  Constitutional:      General: She is not in acute distress.    Appearance: Normal appearance. She is not ill-appearing.  HENT:     Head: Normocephalic and atraumatic.     Right Ear:  Tympanic membrane, ear canal and external ear normal. No tenderness. No middle ear effusion. There is no impacted cerumen. Tympanic membrane is not perforated, erythematous, retracted or bulging.     Left Ear: Tympanic membrane, ear canal and external ear normal. No tenderness.  No middle ear effusion. There is no impacted cerumen. Tympanic membrane is not perforated, erythematous, retracted or bulging.     Nose: Nose normal. No congestion.     Mouth/Throat:     Mouth: Mucous membranes are moist.     Pharynx: Uvula midline. No oropharyngeal exudate or posterior oropharyngeal erythema.  Eyes:     Extraocular Movements: Extraocular movements intact.     Pupils: Pupils are equal, round, and reactive to light.  Cardiovascular:     Rate and Rhythm: Normal rate and regular rhythm.     Heart sounds: Normal heart sounds.  Pulmonary:     Effort: Pulmonary effort is normal.     Breath sounds: Normal breath sounds. No decreased breath sounds, wheezing, rhonchi or rales.  Abdominal:     Palpations: Abdomen is soft.     Tenderness: There is no abdominal tenderness. There is no guarding or rebound.  Lymphadenopathy:     Cervical: No cervical adenopathy.     Right cervical: No superficial cervical adenopathy.    Left cervical: No superficial cervical adenopathy.  Neurological:     General: No focal deficit present.     Mental Status: She is alert and oriented to person, place, and time.  Psychiatric:        Mood and Affect: Mood normal.        Behavior: Behavior normal.        Thought Content: Thought content normal.         Judgment: Judgment normal.     UC Treatments / Results  Labs (all labs ordered are listed, but only abnormal results are displayed) Labs Reviewed  SARS CORONAVIRUS 2 (TAT 6-24 HRS)    EKG   Radiology No results found.  Procedures Procedures (including critical care time)  Medications Ordered in UC Medications - No data to display  Initial Impression / Assessment and Plan / UC Course  I have reviewed the triage vital signs and the nursing notes.  Pertinent labs & imaging results that were available during my care of the patient were reviewed by me and considered in my medical decision making (see chart for details).     This patient is a very pleasant 70 y.o. year old female presenting with viral URI with cough. Today this pt is afebrile nontachycardic nontachypneic, oxygenating well on room air, no wheezes rhonchi or rales. She is clinically well appearing..   Declines influenza testing, which I am in agreement with.  Covid PCR sent.  Low-dose prednisone as below.  ED return precautions discussed. Patient verbalizes understanding and agreement.    Final Clinical Impressions(s) / UC Diagnoses   Final diagnoses:  Viral URI with cough  Encounter for screening for COVID-19     Discharge Instructions      -Prednisone one pill daily x5 days, take this with food in the morning -Continue over-the-counter medications if they're helping -Follow-up if symptoms getting worse instead of better- shortness of breath, new chest pain, etc.  -With a virus, you're typically contagious for 5-7 days, or as long as you're having fevers.       ED Prescriptions     Medication Sig Dispense Auth. Provider   predniSONE (DELTASONE) 20 MG tablet Take 1 tablet (  20 mg total) by mouth daily for 5 days. 5 tablet Rhys Martini, PA-C      PDMP not reviewed this encounter.   Rhys Martini, PA-C 10/24/21 [phone]

## 2021-10-24 NOTE — ED Triage Notes (Signed)
Prt reports cough and congestionnhave been ongoing for 3 days.

## 2021-10-24 NOTE — Discharge Instructions (Addendum)
-  Prednisone one pill daily x5 days, take this with food in the morning -Continue over-the-counter medications if they're helping -Follow-up if symptoms getting worse instead of better- shortness of breath, new chest pain, etc.  -With a virus, you're typically contagious for 5-7 days, or as long as you're having fevers.

## 2022-01-27 ENCOUNTER — Other Ambulatory Visit: Payer: Self-pay | Admitting: Nurse Practitioner

## 2022-01-27 DIAGNOSIS — Z1231 Encounter for screening mammogram for malignant neoplasm of breast: Secondary | ICD-10-CM

## 2022-03-08 ENCOUNTER — Ambulatory Visit
Admission: RE | Admit: 2022-03-08 | Discharge: 2022-03-08 | Disposition: A | Discharge status: Home / Self Care | Payer: Medicare (Managed Care) | Source: Ambulatory Visit | Attending: Nurse Practitioner | Admitting: Nurse Practitioner

## 2022-03-08 DIAGNOSIS — Z1231 Encounter for screening mammogram for malignant neoplasm of breast: Secondary | ICD-10-CM

## 2022-06-15 ENCOUNTER — Encounter (HOSPITAL_COMMUNITY): Payer: Self-pay

## 2022-06-15 ENCOUNTER — Emergency Department (HOSPITAL_COMMUNITY): Payer: Medicare (Managed Care)

## 2022-06-15 ENCOUNTER — Other Ambulatory Visit: Payer: Self-pay

## 2022-06-15 ENCOUNTER — Emergency Department (HOSPITAL_COMMUNITY)
Admission: EM | Admit: 2022-06-15 | Discharge: 2022-06-15 | Disposition: A | Discharge status: Skilled Nursing Facility | Payer: Medicare (Managed Care) | Attending: Emergency Medicine | Admitting: Emergency Medicine

## 2022-06-15 DIAGNOSIS — R4182 Altered mental status, unspecified: Secondary | ICD-10-CM | POA: Diagnosis not present

## 2022-06-15 DIAGNOSIS — R519 Headache, unspecified: Secondary | ICD-10-CM | POA: Diagnosis not present

## 2022-06-15 DIAGNOSIS — W19XXXA Unspecified fall, initial encounter: Secondary | ICD-10-CM | POA: Insufficient documentation

## 2022-06-15 DIAGNOSIS — M25511 Pain in right shoulder: Secondary | ICD-10-CM | POA: Insufficient documentation

## 2022-06-15 DIAGNOSIS — R531 Weakness: Secondary | ICD-10-CM | POA: Diagnosis present

## 2022-06-15 LAB — COMPREHENSIVE METABOLIC PANEL
ALT: 20 U/L (ref 0–44)
AST: 26 U/L (ref 15–41)
Albumin: 3.6 g/dL (ref 3.5–5.0)
Alkaline Phosphatase: 89 U/L (ref 38–126)
Anion gap: 6 (ref 5–15)
BUN: 18 mg/dL (ref 8–23)
CO2: 25 mmol/L (ref 22–32)
Calcium: 9.9 mg/dL (ref 8.9–10.3)
Chloride: 103 mmol/L (ref 98–111)
Creatinine, Ser: 1.14 mg/dL — ABNORMAL HIGH (ref 0.44–1.00)
GFR, Estimated: 51 mL/min — ABNORMAL LOW (ref >60)
Glucose, Bld: 90 mg/dL (ref 70–99)
Potassium: 4.1 mmol/L (ref 3.5–5.1)
Sodium: 134 mmol/L — ABNORMAL LOW (ref 135–145)
Total Bilirubin: 0.4 mg/dL (ref 0.3–1.2)
Total Protein: 7.7 g/dL (ref 6.5–8.1)

## 2022-06-15 LAB — CBC WITH DIFFERENTIAL/PLATELET
Abs Immature Granulocytes: 0.02 10*3/uL (ref 0.00–0.07)
Basophils Absolute: 0 10*3/uL (ref 0.0–0.1)
Basophils Relative: 0 %
Eosinophils Absolute: 0.2 10*3/uL (ref 0.0–0.5)
Eosinophils Relative: 4 %
HCT: 31 % — ABNORMAL LOW (ref 36.0–46.0)
Hemoglobin: 9.7 g/dL — ABNORMAL LOW (ref 12.0–15.0)
Immature Granulocytes: 0 %
Lymphocytes Relative: 22 %
Lymphs Abs: 1.1 10*3/uL (ref 0.7–4.0)
MCH: 26.6 pg (ref 26.0–34.0)
MCHC: 31.3 g/dL (ref 30.0–36.0)
MCV: 84.9 fL (ref 80.0–100.0)
Monocytes Absolute: 0.8 10*3/uL (ref 0.1–1.0)
Monocytes Relative: 16 %
Neutro Abs: 2.9 10*3/uL (ref 1.7–7.7)
Neutrophils Relative %: 58 %
Platelets: 297 10*3/uL (ref 150–400)
RBC: 3.65 MIL/uL — ABNORMAL LOW (ref 3.87–5.11)
RDW: 15.1 % (ref 11.5–15.5)
WBC: 5 10*3/uL (ref 4.0–10.5)
nRBC: 0 % (ref 0.0–0.2)

## 2022-06-15 LAB — URINALYSIS, ROUTINE W REFLEX MICROSCOPIC
Bilirubin Urine: NEGATIVE
Glucose, UA: NEGATIVE mg/dL
Hgb urine dipstick: NEGATIVE
Ketones, ur: NEGATIVE mg/dL
Leukocytes,Ua: NEGATIVE
Nitrite: NEGATIVE
Protein, ur: NEGATIVE mg/dL
Specific Gravity, Urine: 1.004 — ABNORMAL LOW (ref 1.005–1.030)
pH: 6 (ref 5.0–8.0)

## 2022-06-15 LAB — TROPONIN I (HIGH SENSITIVITY)
Troponin I (High Sensitivity): 4 ng/L (ref <18)
Troponin I (High Sensitivity): 3 ng/L (ref <18)

## 2022-06-15 LAB — ETHANOL: Alcohol, Ethyl (B): <10 mg/dL (ref <10)

## 2022-06-15 LAB — LACTIC ACID, PLASMA: Lactic Acid, Venous: 0.9 mmol/L (ref 0.5–1.9)

## 2022-06-15 LAB — CBG MONITORING, ED: Glucose-Capillary: 86 mg/dL (ref 70–99)

## 2022-06-15 LAB — MAGNESIUM: Magnesium: 2.3 mg/dL (ref 1.7–2.4)

## 2022-06-15 MED ORDER — SODIUM CHLORIDE 0.9 % IV BOLUS
1000.0000 mL | Freq: Once | INTRAVENOUS | Status: AC
Start: 2022-06-15 — End: 2022-06-15
  Administered 2022-06-15: 1000 mL via INTRAVENOUS

## 2022-06-15 NOTE — ED Triage Notes (Signed)
Pt bib [facility] from [facility] where she has been for a couple days. Facility called out for pt being more tired than normal. Per family, pt has had increased falls for a couple days and arrived to facility today with a hematoma on left forehead and right shoulder pain. Pt arrives with ccollar on and AOx4 EMS vitals: 134/96, 69, 96%RA

## 2022-06-15 NOTE — ED Notes (Signed)
Pt son arrives stating that the pt is here to get checked out for the recent falls and for xrays for her pain. No falls reported today. Pt is at [facility] to test out rehab due to medication changes and increased falls.

## 2022-06-15 NOTE — ED Notes (Signed)
Attempted to call report to [facility] x 3 with no answer.

## 2022-06-15 NOTE — ED Notes (Signed)
[facility] Called for transport 

## 2022-06-15 NOTE — ED Notes (Signed)
Report received from [facility], [place]. All questions answered. Patient resting in bed. NAD noted. IVF infusing w/o difficulty. Call light in reach.

## 2022-06-15 NOTE — Discharge Instructions (Addendum)
You are seen in the emergency department for evaluation of injuries from a fall.  You had a CAT scan of your head and cervical spine along with x-rays of your right shoulder and chest.  Lab work.  Please continue rehab and follow-up with your regular doctor.  Return to the emergency department if any worsening or concerning symptoms.Marland Kitchen

## 2022-06-15 NOTE — ED Provider Notes (Signed)
[facility] EMERGENCY DEPARTMENT Provider Note   CSN: [id number] Arrival date & time: 06/15/22  1650     History {Add pertinent medical, surgical, social history, OB history to HPI:1} Chief Complaint  Patient presents with   Brenda Murray is a 71 y.o. female.  She is brought in by EMS for frequent falls and possible altered mental status.  Level 5 caveat secondary to altered mental status.  Patient complaining of some head pain and right shoulder pain after falls yesterday.  She does not know why she fell.  She denies any falls today.  She denies any chest pain shortness of breath or abdominal pain.  She denies any numbness or weakness.  The history is provided by the patient and the EMS personnel.  Fall This is a recurrent problem. The current episode started yesterday. The problem has not changed since onset.Associated symptoms include headaches. Pertinent negatives include no chest pain, no abdominal pain and no shortness of breath. The symptoms are aggravated by walking. Nothing relieves the symptoms. She has tried rest for the symptoms. The treatment provided no relief.       Home Medications Prior to Admission medications   Medication Sig Start Date End Date Taking? Authorizing Provider  acetaminophen (TYLENOL) 650 MG CR tablet Take 1,300 mg by mouth every 12 (twelve) hours.    [provider]  antiseptic oral rinse (BIOTENE) LIQD 15 mLs by Mouth Rinse route as needed for dry mouth.    [provider]  Carboxymethylcell-Hypromellose (GENTEAL OP) Place 1 drop into both eyes 2 (two) times daily.    [provider]  diclofenac Sodium (VOLTAREN) 1 % GEL Apply 4 g topically 4 (four) times daily. 08/13/21   Lamptey, Britta Mccreedy, MD  dicyclomine (BENTYL) 20 MG tablet Take 1 tablet (20 mg total) by mouth 2 (two) times daily as needed (abdominal pain). 11/07/20   Jacalyn Lefevre, MD  DULoxetine (CYMBALTA) 60 MG capsule Take 60 mg by mouth  at bedtime.  12/10/16   [provider]  famotidine (PEPCID) 20 MG tablet Take 20 mg by mouth daily.    [provider]  fluticasone (FLONASE) 50 MCG/ACT nasal spray Place 1 spray into both nostrils daily.    [provider]  guaiFENesin (ROBITUSSIN) 100 MG/5ML liquid Take 200 mg by mouth every 4 (four) hours as needed for cough.    [provider]  lithium 300 MG tablet Take 300 mg by mouth in the morning and at bedtime.    [provider]  LORazepam (ATIVAN) 1 MG tablet Take 1 mg by mouth at bedtime. 07/23/17   [provider]  Melatonin 5 MG CAPS Take 5 mg by mouth at bedtime.    [provider]  Multiple Vitamins-Minerals (PRESERVISION AREDS 2+MULTI VIT PO) Take 1 capsule by mouth daily.    [provider]  NEURONTIN 300 MG capsule Take 300 mg by mouth 2 (two) times daily.  01/16/20   [provider]  OLANZapine (ZYPREXA) 10 MG tablet Take 10 mg by mouth in the morning and at bedtime.    [provider]  sodium chloride (OCEAN) 0.65 % SOLN nasal spray Place 1 spray into both nostrils as needed for congestion. 12/06/17   Georgetta Haber, NP  traZODone (DESYREL) 50 MG tablet Take 50 mg by mouth at bedtime.    [provider]      Allergies    Patient has no known allergies.  Review of Systems   Review of Systems  Constitutional:  Negative for fever.  HENT:  Negative for sore throat.   Respiratory:  Negative for shortness of breath.   Cardiovascular:  Negative for chest pain.  Gastrointestinal:  Negative for abdominal pain.  Genitourinary:  Negative for dysuria.  Skin:  Negative for rash.  Neurological:  Positive for headaches.    Physical Exam Updated Vital Signs BP (!) 128/96   Pulse 66   Temp 97.6 F (36.4 C) (Oral)   Resp (!) 23   SpO2 100%  Physical Exam Vitals and nursing note reviewed.  Constitutional:      General: She is not in acute distress.    Appearance: Normal  appearance. She is well-developed.  HENT:     Head: Normocephalic.     Comments: She has a hematoma left forehead Eyes:     Conjunctiva/sclera: Conjunctivae normal.  Cardiovascular:     Rate and Rhythm: Normal rate and regular rhythm.     Heart sounds: No murmur heard. Pulmonary:     Effort: Pulmonary effort is normal. No respiratory distress.     Breath sounds: Normal breath sounds.  Abdominal:     Palpations: Abdomen is soft.     Tenderness: There is no abdominal tenderness. There is no guarding or rebound.  Musculoskeletal:        General: Tenderness present. No deformity. Normal range of motion.     Cervical back: Neck supple.     Comments: She has vague tenderness around her right shoulder although full range of motion and normal landmarks.  Other extremities full range of motion without any pain or limitations.  Skin:    General: Skin is warm and dry.     Capillary Refill: Capillary refill takes less than 2 seconds.  Neurological:     General: No focal deficit present.     Mental Status: She is alert.  Psychiatric:        Mood and Affect: Mood normal.     ED Results / Procedures / Treatments   Labs (all labs ordered are listed, but only abnormal results are displayed) Labs Reviewed  CBG MONITORING, ED    EKG None  Radiology No results found.  Procedures Procedures  {Document cardiac monitor, telemetry assessment procedure when appropriate:1}  Medications Ordered in ED Medications  sodium chloride 0.9 % bolus 1,000 mL (has no administration in time range)    ED Course/ Medical Decision Making/ A&P                           Medical Decision Making Amount and/or Complexity of Data Reviewed Labs: ordered. Radiology: ordered.  This patient complains of ***; this involves an extensive number of treatment Options and is a complaint that carries with it a high risk of complications and morbidity. The differential includes ***  I ordered, reviewed and  interpreted labs, which included *** I ordered medication *** and reviewed PMP when indicated. I ordered imaging studies which included *** and I independently    visualized and interpreted imaging which showed *** Additional history obtained from *** Previous records obtained and reviewed *** I consulted *** and discussed lab and imaging findings and discussed disposition.  Cardiac monitoring reviewed, *** Social determinants considered, *** Critical Interventions: ***  After the interventions stated above, I reevaluated the patient and found *** Admission and further testing considered, ***    {Document critical care time when appropriate:1} {Document review  of labs and clinical decision tools ie heart score, Chads2Vasc2 etc:1}  {Document your independent review of radiology images, and any outside records:1} {Document your discussion with family members, caretakers, and with consultants:1} {Document social determinants of health affecting pt's care:1} {Document your decision making why or why not admission, treatments were needed:1} Final Clinical Impression(s) / ED Diagnoses Final diagnoses:  None    Rx / DC Orders ED Discharge Orders     None

## 2022-06-15 NOTE — ED Notes (Signed)
Attempted to ambulate patient, took two assist and patient would not bear weight. Patient stated she uses a wheelchair at home. MD made aware.

## 2022-06-15 NOTE — ED Notes (Signed)
Report given to [facility], patient being discharged back to [facility] via stretcher. Attempted to call report one more time with no answer.

## 2022-07-06 ENCOUNTER — Emergency Department (HOSPITAL_COMMUNITY): Payer: Medicare (Managed Care)

## 2022-07-06 ENCOUNTER — Emergency Department (HOSPITAL_COMMUNITY)
Admission: EM | Admit: 2022-07-06 | Discharge: 2022-07-06 | Disposition: A | Discharge status: Home / Self Care | Payer: Medicare (Managed Care) | Attending: Emergency Medicine | Admitting: Emergency Medicine

## 2022-07-06 ENCOUNTER — Other Ambulatory Visit: Payer: Self-pay

## 2022-07-06 DIAGNOSIS — E039 Hypothyroidism, unspecified: Secondary | ICD-10-CM | POA: Diagnosis not present

## 2022-07-06 DIAGNOSIS — Z96653 Presence of artificial knee joint, bilateral: Secondary | ICD-10-CM | POA: Insufficient documentation

## 2022-07-06 DIAGNOSIS — W19XXXA Unspecified fall, initial encounter: Secondary | ICD-10-CM

## 2022-07-06 DIAGNOSIS — Y829 Unspecified medical devices associated with adverse incidents: Secondary | ICD-10-CM | POA: Insufficient documentation

## 2022-07-06 DIAGNOSIS — T84033A Mechanical loosening of internal left knee prosthetic joint, initial encounter: Secondary | ICD-10-CM | POA: Insufficient documentation

## 2022-07-06 DIAGNOSIS — M79605 Pain in left leg: Secondary | ICD-10-CM | POA: Diagnosis present

## 2022-07-06 DIAGNOSIS — T84039A Mechanical loosening of unspecified internal prosthetic joint, initial encounter: Secondary | ICD-10-CM

## 2022-07-06 LAB — CBC WITH DIFFERENTIAL/PLATELET
Abs Immature Granulocytes: 0.02 10*3/uL (ref 0.00–0.07)
Basophils Absolute: 0 10*3/uL (ref 0.0–0.1)
Basophils Relative: 0 %
Eosinophils Absolute: 0.2 10*3/uL (ref 0.0–0.5)
Eosinophils Relative: 3 %
HCT: 30.8 % — ABNORMAL LOW (ref 36.0–46.0)
Hemoglobin: 9.8 g/dL — ABNORMAL LOW (ref 12.0–15.0)
Immature Granulocytes: 0 %
Lymphocytes Relative: 13 %
Lymphs Abs: 0.7 10*3/uL (ref 0.7–4.0)
MCH: 27.1 pg (ref 26.0–34.0)
MCHC: 31.8 g/dL (ref 30.0–36.0)
MCV: 85.3 fL (ref 80.0–100.0)
Monocytes Absolute: 0.7 10*3/uL (ref 0.1–1.0)
Monocytes Relative: 13 %
Neutro Abs: 3.9 10*3/uL (ref 1.7–7.7)
Neutrophils Relative %: 71 %
Platelets: 253 10*3/uL (ref 150–400)
RBC: 3.61 MIL/uL — ABNORMAL LOW (ref 3.87–5.11)
RDW: 15.4 % (ref 11.5–15.5)
WBC: 5.6 10*3/uL (ref 4.0–10.5)
nRBC: 0 % (ref 0.0–0.2)

## 2022-07-06 LAB — BASIC METABOLIC PANEL
Anion gap: 6 (ref 5–15)
BUN: 19 mg/dL (ref 8–23)
CO2: 21 mmol/L — ABNORMAL LOW (ref 22–32)
Calcium: 9.9 mg/dL (ref 8.9–10.3)
Chloride: 108 mmol/L (ref 98–111)
Creatinine, Ser: 1.02 mg/dL — ABNORMAL HIGH (ref 0.44–1.00)
GFR, Estimated: 59 mL/min — ABNORMAL LOW (ref >60)
Glucose, Bld: 85 mg/dL (ref 70–99)
Potassium: 4.5 mmol/L (ref 3.5–5.1)
Sodium: 135 mmol/L (ref 135–145)

## 2022-07-06 LAB — TYPE AND SCREEN
ABO/RH(D): A POS
Antibody Screen: NEGATIVE

## 2022-07-06 MED ORDER — ONDANSETRON HCL 4 MG/2ML IJ SOLN
4.0000 mg | Freq: Once | INTRAMUSCULAR | Status: DC
  Filled 2022-07-06: qty 2

## 2022-07-06 MED ORDER — HYDROMORPHONE HCL 1 MG/ML IJ SOLN
0.5000 mg | INTRAMUSCULAR | Status: AC | PRN
  Administered 2022-07-06 (×2): 0.5 mg via INTRAVENOUS
  Filled 2022-07-06 (×2): qty 1

## 2022-07-06 NOTE — Discharge Instructions (Addendum)
Continue your current medications.  Follow-up with your primary care doctor and  your orthopedic doctor regarding the loosening of the prosthesis noted in your left knee joint.

## 2022-07-06 NOTE — ED Provider Notes (Signed)
[facility]-EMERGENCY DEPT Provider Note   CSN: [id number] Arrival date & time: 07/06/22  1235     History  Chief Complaint  Patient presents with  . Fall    Brenda Murray is a 71 y.o. female.   Fall   Patient has a history of anxiety, hypothyroidism, bipolar disorder, indigestion, chronic back pain.  Patient's had bilateral knee replacements.  Patient states she normally uses a wheelchair to assist with her movement.  Patient had a fall last evening around midnight.  Patient was seen by her doctor at home this morning.  She was complaining of pain in her left leg that increased whenever she tried to stand.  She was sent to the ED for further evaluation.  According to their notes they were concerned because she seemed more confused than usual.  Patient is able to clearly tell me that she fell last night.  She is not having any trouble with any headache or back pain.  Patient states she is having a lot of pain in her leg whenever she tries to stand or walk.  The pain is throughout her entire leg.    Home Medications Prior to Admission medications   Medication Sig Start Date End Date Taking? Authorizing Provider  acetaminophen (TYLENOL) 650 MG CR tablet Take 1,300 mg by mouth every 12 (twelve) hours.    [provider]  antiseptic oral rinse (BIOTENE) LIQD 15 mLs by Mouth Rinse route as needed for dry mouth.    [provider]  Carboxymethylcell-Hypromellose (GENTEAL OP) Place 1 drop into both eyes 2 (two) times daily.    [provider]  diclofenac Sodium (VOLTAREN) 1 % GEL Apply 4 g topically 4 (four) times daily. 08/13/21   Lamptey, Britta Mccreedy, MD  dicyclomine (BENTYL) 20 MG tablet Take 1 tablet (20 mg total) by mouth 2 (two) times daily as needed (abdominal pain). 11/07/20   Jacalyn Lefevre, MD  DULoxetine (CYMBALTA) 60 MG capsule Take 60 mg by mouth at bedtime.  12/10/16   [provider]  famotidine (PEPCID) 20 MG tablet Take  20 mg by mouth daily.    [provider]  fluticasone (FLONASE) 50 MCG/ACT nasal spray Place 1 spray into both nostrils daily.    [provider]  guaiFENesin (ROBITUSSIN) 100 MG/5ML liquid Take 200 mg by mouth every 4 (four) hours as needed for cough.    [provider]  lithium 300 MG tablet Take 300 mg by mouth in the morning and at bedtime.    [provider]  LORazepam (ATIVAN) 1 MG tablet Take 1 mg by mouth at bedtime. 07/23/17   [provider]  Melatonin 5 MG CAPS Take 5 mg by mouth at bedtime.    [provider]  Multiple Vitamins-Minerals (PRESERVISION AREDS 2+MULTI VIT PO) Take 1 capsule by mouth daily.    [provider]  NEURONTIN 300 MG capsule Take 300 mg by mouth 2 (two) times daily.  01/16/20   [provider]  OLANZapine (ZYPREXA) 10 MG tablet Take 10 mg by mouth in the morning and at bedtime.    [provider]  sodium chloride (OCEAN) 0.65 % SOLN nasal spray Place 1 spray into both nostrils as needed for congestion. 12/06/17   Georgetta Haber, NP  traZODone (DESYREL) 50 MG tablet Take 50 mg by mouth at bedtime.    [provider]      Allergies    Patient has no known allergies.  Review of Systems   Review of Systems  Physical Exam Updated Vital Signs BP 133/68   Pulse 60   Temp 98.7 F (37.1 C) (Oral)   Resp 18   SpO2 99%  Physical Exam Vitals and nursing note reviewed.  Constitutional:      Appearance: She is well-developed. She is not diaphoretic.  HENT:     Head: Normocephalic and atraumatic.     Right Ear: External ear normal.     Left Ear: External ear normal.  Eyes:     General: No scleral icterus.       Right eye: No discharge.        Left eye: No discharge.     Conjunctiva/sclera: Conjunctivae normal.  Neck:     Trachea: No tracheal deviation.  Cardiovascular:     Rate and Rhythm: Normal rate and regular rhythm.  Pulmonary:     Effort: Pulmonary effort is  normal. No respiratory distress.     Breath sounds: Normal breath sounds. No stridor. No wheezing or rales.  Abdominal:     General: Bowel sounds are normal. There is no distension.     Palpations: Abdomen is soft.     Tenderness: There is no abdominal tenderness. There is no guarding or rebound.  Musculoskeletal:        General: No tenderness or deformity.     Cervical back: Neck supple.     Comments: , No spinal tenderness, tenderness palpation left hip left knee and left ankle  Skin:    General: Skin is warm and dry.     Findings: No rash.  Neurological:     General: No focal deficit present.     Mental Status: She is alert.     Cranial Nerves: No cranial nerve deficit (no facial droop, extraocular movements intact, no slurred speech).     Sensory: No sensory deficit.     Motor: No abnormal muscle tone or seizure activity.     Coordination: Coordination normal.     Comments: Answer questions appropriately, following commands  Psychiatric:        Mood and Affect: Mood normal.     ED Results / Procedures / Treatments   Labs (all labs ordered are listed, but only abnormal results are displayed) Labs Reviewed  BASIC METABOLIC PANEL - Abnormal; Notable for the following components:      Result Value   CO2 21 (*)    Creatinine, Ser 1.02 (*)    GFR, Estimated 59 (*)    All other components within normal limits  CBC WITH DIFFERENTIAL/PLATELET - Abnormal; Notable for the following components:   RBC 3.61 (*)    Hemoglobin 9.8 (*)    HCT 30.8 (*)    All other components within normal limits  TYPE AND SCREEN    EKG EKG Interpretation  Date/Time:  Tuesday July 06 2022 13:03:04 EDT Ventricular Rate:  62 PR Interval:  181 QRS Duration: 85 QT Interval:  406 QTC Calculation: 413 R Axis:   49 Text Interpretation: Sinus rhythm No significant change since last tracing Confirmed by Linwood Dibbles [phone]) on 07/06/2022 2:28:55 PM  Radiology No results  found.  Procedures Procedures    Medications Ordered in ED Medications  HYDROmorphone (DILAUDID) injection 0.5 mg (0.5 mg Intravenous Given 07/06/22 1431)  ondansetron (ZOFRAN) injection 4 mg (4 mg Intravenous Not Given 07/06/22 1434)    ED Course/ Medical Decision Making/ A&P Clinical Course as of 07/06/22 1650  Tue Jul 06, 2022  1548  Knee x-ray shows loosening of the prosthesis [JK]  1549 X-ray negative [JK]  1549 Ankle x-ray negative [JK]  1549 Bilateral airspace disease right greater than left. [JK]  1649 Lab and x-ray results reviewed.  Anemia stable.  No electrolyte abnormalities. [JK]    Clinical Course User Index [JK] Linwood Dibbles, MD                           Medical Decision Making Problems Addressed: Fall, initial encounter: acute illness or injury Prosthetic joint loosening, initial encounter [facility]): chronic illness or injury with exacerbation, progression, or side effects of treatment  Amount and/or Complexity of Data Reviewed Labs: ordered. Decision-making details documented in ED Course. Radiology: ordered and independent interpretation performed. Decision-making details documented in ED Course.  Risk Prescription drug management.   Patient presented to ED for evaluation after a fall.  Known history of spinal stenosis as well as bilateral prosthetic knee replacements.  X-rays do not show any signs of acute fracture.  Loosening of the left knee prosthesis noted.  Patient does not have any surrounding erythema or swelling.  I doubt acute infection.  She has been having persistent leg pain.  Is possible some this could be her spinal stenosis as well but there is no acute neurologic dysfunction noted.  No signs of any serious injury associated with her fall on head CT.  Patient appears appropriate for discharge and outpatient management.  She will need close follow-up with her orthopedist.         Final Clinical Impression(s) / ED Diagnoses Final diagnoses:   Fall, initial encounter  Prosthetic joint loosening, initial encounter [facility])    Rx / DC Orders ED Discharge Orders     None         Linwood Dibbles, MD 07/06/22 1650

## 2022-07-06 NOTE — ED Notes (Signed)
[facility] called and say they are this patients medical provider. If you need a medical information you can call them: [phone].

## 2022-07-06 NOTE — ED Triage Notes (Signed)
Pt. BIB [facility] for a fall that happened @ midnight. Per EMS Pt. Called her PCP and they came to evaluate her in the home. Per evaluation pt had an increase in altered mental status and c/o pain in the left leg. Pt. Has a HX of mental health disorders and early stage dementia.

## 2022-07-06 NOTE — ED Notes (Addendum)
Patients son said he would be here in about 1 hour, coming from [place], Ginnie Smart is his name.

## 2022-07-06 NOTE — ED Notes (Signed)
Pt. And family member verbalized understanding and comprehension of discharge instructions. Pt. And family had no concerns or questions regarding discharge paperwork.

## 2022-11-06 ENCOUNTER — Encounter (HOSPITAL_COMMUNITY): Payer: Self-pay

## 2022-11-06 ENCOUNTER — Emergency Department (HOSPITAL_COMMUNITY)
Admission: EM | Admit: 2022-11-06 | Discharge: 2022-11-06 | Disposition: A | Discharge status: Home / Self Care | Payer: Medicare (Managed Care) | Attending: Emergency Medicine | Admitting: Emergency Medicine

## 2022-11-06 DIAGNOSIS — S0502XA Injury of conjunctiva and corneal abrasion without foreign body, left eye, initial encounter: Secondary | ICD-10-CM | POA: Diagnosis not present

## 2022-11-06 DIAGNOSIS — H5789 Other specified disorders of eye and adnexa: Secondary | ICD-10-CM | POA: Diagnosis present

## 2022-11-06 DIAGNOSIS — S0501XA Injury of conjunctiva and corneal abrasion without foreign body, right eye, initial encounter: Secondary | ICD-10-CM | POA: Insufficient documentation

## 2022-11-06 DIAGNOSIS — W458XXA Other foreign body or object entering through skin, initial encounter: Secondary | ICD-10-CM | POA: Diagnosis not present

## 2022-11-06 MED ORDER — ERYTHROMYCIN 5 MG/GM OP OINT
TOPICAL_OINTMENT | Freq: Once | OPHTHALMIC | Status: AC
  Administered 2022-11-06: 1 via OPHTHALMIC
  Filled 2022-11-06: qty 3.5

## 2022-11-06 MED ORDER — TETRACAINE HCL 0.5 % OP SOLN
2.0000 [drp] | Freq: Once | OPHTHALMIC | Status: AC
  Administered 2022-11-06: 2 [drp] via OPHTHALMIC
  Filled 2022-11-06: qty 4

## 2022-11-06 MED ORDER — FLUORESCEIN SODIUM 1 MG OP STRP
1.0000 | ORAL_STRIP | Freq: Once | OPHTHALMIC | Status: AC
  Administered 2022-11-06: 1 via OPHTHALMIC
  Filled 2022-11-06: qty 1

## 2022-11-06 NOTE — Discharge Instructions (Addendum)
You were seen in the ER today for evaluation of your eye itching. It appears that you have some abrasions to your eyes. For this, you will need to put this ointment that was given to you on your eyes four times a day while you are awake for the next 4-5 days. For your insomnia, I would like for you to follow up with your PCP. I have already called them to let them know. Please reach out to schedule an appointment. You can also try a lubricating eye drops as needed. Additionally, you will need to follow up with the eye doctor, or your own eye doctor, on this discharge paperwork for them to follow up on your eyes. If you have any concerns, new or worsening symptoms, please return to the nearest ER for evaluation.   Contact a doctor if: You keep having eye pain and other symptoms for more than 2 days. You get new symptoms, such as more redness, watery eyes, or discharge. You have discharge that makes your eyelids stick together in the morning. Your eye patch becomes so loose that you can blink your eye. Symptoms come back after your eye heals. Get help right away if: You have very bad eye pain that does not get better with medicine. You lose eyesight.

## 2022-11-06 NOTE — ED Notes (Signed)
Pt usually wears glasses but she broke them yesterday

## 2022-11-06 NOTE — ED Notes (Signed)
[facility] contacted for discharge transport 

## 2022-11-06 NOTE — ED Provider Notes (Signed)
[facility]-EMERGENCY DEPT Provider Note   CSN: [id number] Arrival date & time: 11/06/22  0754     History {Add pertinent medical, surgical, social history, OB history to HPI:1} Chief Complaint  Patient presents with   Eye Problem    Brenda Murray is a 71 y.o. female.   Eye Problem      Home Medications Prior to Admission medications   Medication Sig Start Date End Date Taking? Authorizing Provider  acetaminophen (TYLENOL) 650 MG CR tablet Take 1,300 mg by mouth every 12 (twelve) hours.    [provider]  antiseptic oral rinse (BIOTENE) LIQD 15 mLs by Mouth Rinse route as needed for dry mouth.    [provider]  Carboxymethylcell-Hypromellose (GENTEAL OP) Place 1 drop into both eyes 2 (two) times daily.    [provider]  diclofenac Sodium (VOLTAREN) 1 % GEL Apply 4 g topically 4 (four) times daily. 08/13/21   Lamptey, Britta Mccreedy, MD  dicyclomine (BENTYL) 20 MG tablet Take 1 tablet (20 mg total) by mouth 2 (two) times daily as needed (abdominal pain). 11/07/20   Jacalyn Lefevre, MD  DULoxetine (CYMBALTA) 60 MG capsule Take 60 mg by mouth at bedtime.  12/10/16   [provider]  famotidine (PEPCID) 20 MG tablet Take 20 mg by mouth daily.    [provider]  fluticasone (FLONASE) 50 MCG/ACT nasal spray Place 1 spray into both nostrils daily.    [provider]  guaiFENesin (ROBITUSSIN) 100 MG/5ML liquid Take 200 mg by mouth every 4 (four) hours as needed for cough.    [provider]  lithium 300 MG tablet Take 300 mg by mouth in the morning and at bedtime.    [provider]  LORazepam (ATIVAN) 1 MG tablet Take 1 mg by mouth at bedtime. 07/23/17   [provider]  Melatonin 5 MG CAPS Take 5 mg by mouth at bedtime.    [provider]  Multiple Vitamins-Minerals (PRESERVISION AREDS 2+MULTI VIT PO) Take 1 capsule by mouth daily.    [provider]  NEURONTIN 300 MG  capsule Take 300 mg by mouth 2 (two) times daily.  01/16/20   [provider]  OLANZapine (ZYPREXA) 10 MG tablet Take 10 mg by mouth in the morning and at bedtime.    [provider]  sodium chloride (OCEAN) 0.65 % SOLN nasal spray Place 1 spray into both nostrils as needed for congestion. 12/06/17   Georgetta Haber, NP  traZODone (DESYREL) 50 MG tablet Take 50 mg by mouth at bedtime.    [provider]      Allergies    Patient has no known allergies.    Review of Systems   Review of Systems  Physical Exam Updated Vital Signs BP 107/79 (BP Location: Left Arm)   Pulse 99   Temp 97.9 F (36.6 C) (Oral)   Resp 15   SpO2 97%  Physical Exam  ED Results / Procedures / Treatments   Labs (all labs ordered are listed, but only abnormal results are displayed) Labs Reviewed - No data to display  EKG None  Radiology No results found.  Procedures Procedures  {Document cardiac monitor, telemetry assessment procedure when appropriate:1}  Medications Ordered in ED Medications  fluorescein ophthalmic strip 1 strip (1 strip Both Eyes Given 11/06/22 0903)  tetracaine (PONTOCAINE) 0.5 % ophthalmic solution 2 drop (2 drops Both Eyes Given 11/06/22 0737)    ED Course/ Medical Decision Making/ A&P  Medical Decision Making Risk Prescription drug management.   ***  {Document critical care time when appropriate:1} {Document review of labs and clinical decision tools ie heart score, Chads2Vasc2 etc:1}  {Document your independent review of radiology images, and any outside records:1} {Document your discussion with family members, caretakers, and with consultants:1} {Document social determinants of health affecting pt's care:1} {Document your decision making why or why not admission, treatments were needed:1} Final Clinical Impression(s) / ED Diagnoses Final diagnoses:  None    Rx / DC Orders ED Discharge Orders     None

## 2022-11-06 NOTE — ED Triage Notes (Signed)
Pt arrived via EMS, c/o bilateral eye stinging and irritation.

## 2022-11-06 NOTE — ED Provider Triage Note (Signed)
Emergency Medicine Provider Triage Evaluation Note  Brenda Murray , a 71 y.o. female  was evaluated in triage.  Pt complains of bilateral eye pain. Patient reports that she has been up for 48 hours. For the past 24 hours, she is complaining of bilateral eye "stinging". Denies any injury or any FBS. She was seen at [facility] yesterday and told to place a cold rag. Denies any blurry vision  Review of Systems  Positive:  Negative:   Physical Exam  There were no vitals taken for this visit. Gen:   Awake, no distress   Resp:  Normal effort  MSK:   Moves extremities without difficulty  Other:  No obvious trauma  Medical Decision Making  Medically screening exam initiated at 8:00 AM.  Appropriate orders placed.  Brenda Murray was informed that the remainder of the evaluation will be completed by another provider, this initial triage assessment does not replace that evaluation, and the importance of remaining in the ED until their evaluation is complete.  Will need eye exam.   Achille Rich, PA-C 11/06/22 1287

## 2022-12-24 ENCOUNTER — Other Ambulatory Visit: Payer: Self-pay | Admitting: Internal Medicine

## 2022-12-24 DIAGNOSIS — Z1231 Encounter for screening mammogram for malignant neoplasm of breast: Secondary | ICD-10-CM

## 2023-02-01 ENCOUNTER — Encounter: Payer: Self-pay | Admitting: Internal Medicine

## 2023-03-10 ENCOUNTER — Ambulatory Visit: Payer: Medicare (Managed Care)

## 2023-03-11 DIAGNOSIS — R194 Change in bowel habit: Secondary | ICD-10-CM | POA: Insufficient documentation

## 2023-03-11 DIAGNOSIS — R109 Unspecified abdominal pain: Secondary | ICD-10-CM | POA: Insufficient documentation

## 2023-03-11 DIAGNOSIS — R152 Fecal urgency: Secondary | ICD-10-CM | POA: Insufficient documentation

## 2023-03-11 DIAGNOSIS — K625 Hemorrhage of anus and rectum: Secondary | ICD-10-CM | POA: Insufficient documentation

## 2023-03-11 DIAGNOSIS — R1011 Right upper quadrant pain: Secondary | ICD-10-CM | POA: Insufficient documentation

## 2023-03-11 DIAGNOSIS — K5730 Diverticulosis of large intestine without perforation or abscess without bleeding: Secondary | ICD-10-CM | POA: Insufficient documentation

## 2023-03-11 DIAGNOSIS — Z1211 Encounter for screening for malignant neoplasm of colon: Secondary | ICD-10-CM | POA: Insufficient documentation

## 2023-03-11 DIAGNOSIS — D509 Iron deficiency anemia, unspecified: Secondary | ICD-10-CM | POA: Insufficient documentation

## 2023-03-11 DIAGNOSIS — R141 Gas pain: Secondary | ICD-10-CM | POA: Insufficient documentation

## 2023-03-11 DIAGNOSIS — R140 Abdominal distension (gaseous): Secondary | ICD-10-CM | POA: Insufficient documentation

## 2023-03-11 DIAGNOSIS — K7581 Nonalcoholic steatohepatitis (NASH): Secondary | ICD-10-CM | POA: Insufficient documentation

## 2023-03-11 DIAGNOSIS — K140 Glossitis: Secondary | ICD-10-CM | POA: Insufficient documentation

## 2023-03-11 DIAGNOSIS — R634 Abnormal weight loss: Secondary | ICD-10-CM | POA: Insufficient documentation

## 2023-03-18 ENCOUNTER — Telehealth: Payer: Self-pay | Admitting: *Deleted

## 2023-03-18 NOTE — Telephone Encounter (Signed)
Attempt to reach pt for pre-visit appt. Will attempt other # listed in profile. Second attempt to reach pt for pre-visit no answer then to BS. Will attempt second mobile # listed in profile. Third attempt to reach  pt for pre-visit VM full unable to leave message. Per protocol if pt has not called to reschedule by end of day her pre-visit both pre-visit and procedure will be canceled.

## 2023-04-11 ENCOUNTER — Encounter: Payer: Medicare (Managed Care) | Admitting: Internal Medicine

## 2023-05-11 ENCOUNTER — Ambulatory Visit: Payer: Medicare (Managed Care)

## 2023-06-08 ENCOUNTER — Ambulatory Visit: Payer: Medicare (Managed Care)

## 2023-06-09 ENCOUNTER — Ambulatory Visit: Payer: Medicare (Managed Care)

## 2023-06-24 ENCOUNTER — Ambulatory Visit
Admission: RE | Admit: 2023-06-24 | Discharge: 2023-06-24 | Disposition: A | Discharge status: Home / Self Care | Payer: Medicare (Managed Care) | Source: Ambulatory Visit | Attending: Internal Medicine | Admitting: Internal Medicine

## 2023-06-24 ENCOUNTER — Encounter: Payer: Self-pay | Admitting: Internal Medicine

## 2023-06-24 DIAGNOSIS — Z1231 Encounter for screening mammogram for malignant neoplasm of breast: Secondary | ICD-10-CM

## 2023-08-17 ENCOUNTER — Encounter (HOSPITAL_COMMUNITY): Payer: Self-pay

## 2023-08-17 ENCOUNTER — Other Ambulatory Visit: Payer: Self-pay

## 2023-08-17 ENCOUNTER — Emergency Department (HOSPITAL_COMMUNITY)
Admission: EM | Admit: 2023-08-17 | Discharge: 2023-08-17 | Disposition: A | Discharge status: Home / Self Care | Payer: Medicare (Managed Care) | Attending: Emergency Medicine | Admitting: Emergency Medicine

## 2023-08-17 ENCOUNTER — Emergency Department (HOSPITAL_COMMUNITY): Payer: Medicare (Managed Care)

## 2023-08-17 DIAGNOSIS — R519 Headache, unspecified: Secondary | ICD-10-CM | POA: Diagnosis not present

## 2023-08-17 DIAGNOSIS — R112 Nausea with vomiting, unspecified: Secondary | ICD-10-CM | POA: Diagnosis present

## 2023-08-17 DIAGNOSIS — F319 Bipolar disorder, unspecified: Secondary | ICD-10-CM | POA: Diagnosis not present

## 2023-08-17 DIAGNOSIS — R109 Unspecified abdominal pain: Secondary | ICD-10-CM | POA: Diagnosis not present

## 2023-08-17 DIAGNOSIS — F419 Anxiety disorder, unspecified: Secondary | ICD-10-CM | POA: Insufficient documentation

## 2023-08-17 DIAGNOSIS — E039 Hypothyroidism, unspecified: Secondary | ICD-10-CM | POA: Diagnosis not present

## 2023-08-17 DIAGNOSIS — R4182 Altered mental status, unspecified: Secondary | ICD-10-CM | POA: Insufficient documentation

## 2023-08-17 LAB — URINALYSIS, ROUTINE W REFLEX MICROSCOPIC
Bilirubin Urine: NEGATIVE
Glucose, UA: NEGATIVE mg/dL
Hgb urine dipstick: NEGATIVE
Ketones, ur: NEGATIVE mg/dL
Leukocytes,Ua: NEGATIVE
Nitrite: NEGATIVE
Protein, ur: NEGATIVE mg/dL
Specific Gravity, Urine: 1.018 (ref 1.005–1.030)
pH: 6 (ref 5.0–8.0)

## 2023-08-17 LAB — CBC
HCT: 33.5 % — ABNORMAL LOW (ref 36.0–46.0)
Hemoglobin: 10.4 g/dL — ABNORMAL LOW (ref 12.0–15.0)
MCH: 26.1 pg (ref 26.0–34.0)
MCHC: 31 g/dL (ref 30.0–36.0)
MCV: 84.2 fL (ref 80.0–100.0)
Platelets: 263 10*3/uL (ref 150–400)
RBC: 3.98 MIL/uL (ref 3.87–5.11)
RDW: 14.6 % (ref 11.5–15.5)
WBC: 5.9 10*3/uL (ref 4.0–10.5)
nRBC: 0 % (ref 0.0–0.2)

## 2023-08-17 LAB — COMPREHENSIVE METABOLIC PANEL
ALT: 17 U/L (ref 0–44)
AST: 22 U/L (ref 15–41)
Albumin: 3.9 g/dL (ref 3.5–5.0)
Alkaline Phosphatase: 64 U/L (ref 38–126)
Anion gap: 12 (ref 5–15)
BUN: 17 mg/dL (ref 8–23)
CO2: 24 mmol/L (ref 22–32)
Calcium: 9.8 mg/dL (ref 8.9–10.3)
Chloride: 96 mmol/L — ABNORMAL LOW (ref 98–111)
Creatinine, Ser: 0.99 mg/dL (ref 0.44–1.00)
GFR, Estimated: >60 mL/min (ref >60)
Glucose, Bld: 103 mg/dL — ABNORMAL HIGH (ref 70–99)
Potassium: 3.8 mmol/L (ref 3.5–5.1)
Sodium: 132 mmol/L — ABNORMAL LOW (ref 135–145)
Total Bilirubin: 0.5 mg/dL (ref 0.3–1.2)
Total Protein: 8.8 g/dL — ABNORMAL HIGH (ref 6.5–8.1)

## 2023-08-17 LAB — TROPONIN I (HIGH SENSITIVITY)
Troponin I (High Sensitivity): 2 ng/L (ref <18)
Troponin I (High Sensitivity): <2 ng/L (ref <18)

## 2023-08-17 LAB — LIPASE, BLOOD: Lipase: 24 U/L (ref 11–51)

## 2023-08-17 LAB — LITHIUM LEVEL: Lithium Lvl: 0.36 mmol/L — ABNORMAL LOW (ref 0.60–1.20)

## 2023-08-17 MED ORDER — LACTATED RINGERS IV BOLUS
1000.0000 mL | Freq: Once | INTRAVENOUS | Status: AC
  Administered 2023-08-17: 1000 mL via INTRAVENOUS

## 2023-08-17 MED ORDER — IOHEXOL 350 MG/ML SOLN
75.0000 mL | Freq: Once | INTRAVENOUS | Status: AC | PRN
  Administered 2023-08-17: 75 mL via INTRAVENOUS

## 2023-08-17 MED ORDER — ONDANSETRON 4 MG PO TBDP
4.0000 mg | ORAL_TABLET | Freq: Once | ORAL | Status: AC
  Administered 2023-08-17: 4 mg via ORAL
  Filled 2023-08-17: qty 1

## 2023-08-17 MED ORDER — LORAZEPAM 1 MG PO TABS
0.5000 mg | ORAL_TABLET | Freq: Once | ORAL | Status: AC
  Administered 2023-08-17: 0.5 mg via ORAL
  Filled 2023-08-17: qty 1

## 2023-08-17 MED ORDER — ONDANSETRON HCL 4 MG/2ML IJ SOLN
4.0000 mg | Freq: Once | INTRAMUSCULAR | Status: AC
  Administered 2023-08-17: 4 mg via INTRAVENOUS
  Filled 2023-08-17: qty 2

## 2023-08-17 MED ORDER — ONDANSETRON 4 MG PO TBDP: 4.0000 mg | ORAL_TABLET | Freq: Three times a day (TID) | ORAL | 0 refills | Status: AC | PRN

## 2023-08-17 NOTE — ED Notes (Signed)
Pt able to keep po pills down

## 2023-08-17 NOTE — ED Triage Notes (Addendum)
Pt coming in via POV w/ c/o nausea, vomiting,  and fatigue that started two days ago. Pt also states she's anxious. Pt family states that her medicine for anxiety was delivered to the wrong apartment and has been unable to take it. Pt's son states "I think all of this is mostly anxiety related but she's mainly concerned about the throwing up part." Pt denies SOB, CP. Pt is a&ox4.

## 2023-08-17 NOTE — ED Notes (Signed)
Pt verbalized understanding of discharge instructions. Pt wheeled from ed . Family to drive home

## 2023-08-17 NOTE — ED Notes (Signed)
Pt's son states he would like update when pt roomed.

## 2023-08-17 NOTE — ED Provider Notes (Signed)
[facility]MENT AT [facility] Provider Note   CSN: [id number] Arrival date & time: 08/17/23  0106     History  Chief Complaint  Patient presents with   Emesis    Brenda Murray is a 72 y.o. female.  Patient with a history of anxiety, hypothyroidism, bipolar disorder presenting with 2 days of nausea, vomiting and abdominal pain.  She believes this is due to not having her "anxiety medications" for the past 2 days as they were sent to the wrong address.  She will have the medications tomorrow.  She does not know what medications they are.  States she has had intermittent vomiting for the past 2 days about 2-3 times with severe nausea.  No diarrhea.  No chest pain or shortness of breath.  No fever.  No pain with urination or blood in the urine.  Mild headache.  Some dizziness with position change but none currently.  No focal weakness, numbness or tingling.  No difficulty speaking or difficulty swallowing.  No bowel or bladder incontinence.  Still has appendix and gallbladder.  Denies any history of stomach problems.  The history is provided by the patient.  Emesis Associated symptoms: abdominal pain   Associated symptoms: no arthralgias, no cough, no fever, no headaches and no myalgias        Home Medications Prior to Admission medications   Medication Sig Start Date End Date Taking? Authorizing Provider  acetaminophen (TYLENOL) 650 MG CR tablet Take 1,300 mg by mouth every 12 (twelve) hours.    [provider]  antiseptic oral rinse (BIOTENE) LIQD 15 mLs by Mouth Rinse route as needed for dry mouth.    [provider]  Carboxymethylcell-Hypromellose (GENTEAL OP) Place 1 drop into both eyes 2 (two) times daily.    [provider]  diclofenac Sodium (VOLTAREN) 1 % GEL Apply 4 g topically 4 (four) times daily. 08/13/21   Lamptey, Britta Mccreedy, MD  dicyclomine (BENTYL) 20 MG tablet Take 1 tablet (20 mg total) by mouth 2 (two) times daily  as needed (abdominal pain). 11/07/20   Jacalyn Lefevre, MD  DULoxetine (CYMBALTA) 60 MG capsule Take 60 mg by mouth at bedtime.  12/10/16   [provider]  famotidine (PEPCID) 20 MG tablet Take 20 mg by mouth daily.    [provider]  fluticasone (FLONASE) 50 MCG/ACT nasal spray Place 1 spray into both nostrils daily.    [provider]  guaiFENesin (ROBITUSSIN) 100 MG/5ML liquid Take 200 mg by mouth every 4 (four) hours as needed for cough.    [provider]  lithium 300 MG tablet Take 300 mg by mouth in the morning and at bedtime.    [provider]  LORazepam (ATIVAN) 1 MG tablet Take 1 mg by mouth at bedtime. 07/23/17   [provider]  Melatonin 5 MG CAPS Take 5 mg by mouth at bedtime.    [provider]  Multiple Vitamins-Minerals (PRESERVISION AREDS 2+MULTI VIT PO) Take 1 capsule by mouth daily.    [provider]  NEURONTIN 300 MG capsule Take 300 mg by mouth 2 (two) times daily.  01/16/20   [provider]  OLANZapine (ZYPREXA) 10 MG tablet Take 10 mg by mouth in the morning and at bedtime.    [provider]  sodium chloride (OCEAN) 0.65 % SOLN nasal spray Place 1 spray into both nostrils as needed for congestion. 12/06/17   Georgetta Haber, NP  traZODone (DESYREL) 50  MG tablet Take 50 mg by mouth at bedtime.    [provider]      Allergies    Patient has no known allergies.    Review of Systems   Review of Systems  Constitutional:  Positive for activity change and appetite change. Negative for fever.  HENT:  Negative for congestion and rhinorrhea.   Respiratory:  Negative for cough, chest tightness and shortness of breath.   Cardiovascular:  Negative for chest pain.  Gastrointestinal:  Positive for abdominal pain, nausea and vomiting.  Genitourinary:  Negative for dysuria and hematuria.  Musculoskeletal:  Negative for arthralgias and myalgias.  Skin:  Negative for rash.   Neurological:  Positive for dizziness and weakness. Negative for headaches.   all other systems are negative except as noted in the HPI and PMH.    Physical Exam Updated Vital Signs BP 132/88 (BP Location: Left Arm)   Pulse 92   Temp 98.9 F (37.2 C)   Resp 20   Ht 5\' 4"  (1.626 m)   SpO2 98%   BMI 36.71 kg/m  Physical Exam Vitals and nursing note reviewed.  Constitutional:      General: She is not in acute distress.    Appearance: She is well-developed. She is obese.     Comments: anxious  HENT:     Head: Normocephalic and atraumatic.     Mouth/Throat:     Pharynx: No oropharyngeal exudate.  Eyes:     Conjunctiva/sclera: Conjunctivae normal.     Pupils: Pupils are equal, round, and reactive to light.  Neck:     Comments: No meningismus. Cardiovascular:     Rate and Rhythm: Normal rate and regular rhythm.     Heart sounds: Normal heart sounds. No murmur heard. Pulmonary:     Effort: Pulmonary effort is normal. No respiratory distress.     Breath sounds: Normal breath sounds.  Abdominal:     Palpations: Abdomen is soft.     Tenderness: There is abdominal tenderness. There is no guarding or rebound.     Comments: Mild diffuse tenderness  Musculoskeletal:        General: No tenderness. Normal range of motion.     Cervical back: Normal range of motion and neck supple.  Skin:    General: Skin is warm.  Neurological:     Mental Status: She is alert and oriented to person, place, and time.     Cranial Nerves: No cranial nerve deficit.     Motor: No abnormal muscle tone.     Coordination: Coordination normal.     Comments: CN 2-12 intact, no ataxia on finger to nose, no nystagmus, 5/5 strength throughout, no pronator drift   Psychiatric:        Behavior: Behavior normal.    ED Results / Procedures / Treatments   Labs (all labs ordered are listed, but only abnormal results are displayed) Labs Reviewed  COMPREHENSIVE METABOLIC PANEL - Abnormal; Notable for the  following components:      Result Value   Sodium 132 (*)    Chloride 96 (*)    Glucose, Bld 103 (*)    Total Protein 8.8 (*)    All other components within normal limits  CBC - Abnormal; Notable for the following components:   Hemoglobin 10.4 (*)    HCT 33.5 (*)    All other components within normal limits  URINALYSIS, ROUTINE W REFLEX MICROSCOPIC - Abnormal; Notable for the following components:   APPearance HAZY (*)  All other components within normal limits  LITHIUM LEVEL - Abnormal; Notable for the following components:   Lithium Lvl 0.36 (*)    All other components within normal limits  LIPASE, BLOOD  TROPONIN I (HIGH SENSITIVITY)  TROPONIN I (HIGH SENSITIVITY)    EKG EKG Interpretation Date/Time:  Wednesday August 17 2023 04:36:26 EDT Ventricular Rate:  67 PR Interval:  168 QRS Duration:  90 QT Interval:  401 QTC Calculation: 424 R Axis:   159  Text Interpretation: Right and left arm electrode reversal, interpretation assumes no reversal Sinus rhythm Atrial premature complex Probable lateral infarct, age indeterminate No significant change was found Confirmed by Glynn Octave [phone]) on 08/17/2023 4:43:18 AM  Radiology CT ABDOMEN PELVIS W CONTRAST  Result Date: 08/17/2023 CLINICAL DATA:  Acute, nonlocalized abdominal pain EXAM: CT ABDOMEN AND PELVIS WITH CONTRAST TECHNIQUE: Multidetector CT imaging of the abdomen and pelvis was performed using the standard protocol following bolus administration of intravenous contrast. RADIATION DOSE REDUCTION: This exam was performed according to the departmental dose-optimization program which includes automated exposure control, adjustment of the mA and/or kV according to patient size and/or use of iterative reconstruction technique. CONTRAST:  75mL OMNIPAQUE IOHEXOL 350 MG/ML SOLN COMPARISON:  11/07/2020 FINDINGS: Lower chest: Linear opacity with volume loss in the left lower lobe from scar. A small hiatal hernia is likely,  cannot exclude a small juxta phrenic diverticulum. Hepatobiliary: No focal liver abnormality.No evidence of biliary obstruction or stone. Pancreas: Unremarkable. Spleen: Unremarkable. Adrenals/Urinary Tract: Negative adrenals. No hydronephrosis or stone. Cystic densities in the kidneys measuring up to 19 mm at the right lower pole. No follow-up imaging is recommended. Unremarkable bladder. Stomach/Bowel: No obstruction. No appendicitis. Diverticulosis of the left colon. Vascular/Lymphatic: No acute vascular abnormality. Atheromatous calcification of the aorta. No mass or adenopathy. Reproductive:No pathologic findings. Other: No ascites or pneumoperitoneum. Musculoskeletal: No acute abnormalities. Generalized lumbar spine degeneration. High-grade spinal stenosis at L3-4 and L4-5. IMPRESSION: No acute finding.  No bowel obstruction or visible inflammation. Electronically Signed   By: Tiburcio Pea M.D.   On: 08/17/2023 06:42   CT Head Wo Contrast  Result Date: 08/17/2023 CLINICAL DATA:  Mental status change with unknown cause EXAM: CT HEAD WITHOUT CONTRAST TECHNIQUE: Contiguous axial images were obtained from the base of the skull through the vertex without intravenous contrast. RADIATION DOSE REDUCTION: This exam was performed according to the departmental dose-optimization program which includes automated exposure control, adjustment of the mA and/or kV according to patient size and/or use of iterative reconstruction technique. COMPARISON:  06/15/2022 FINDINGS: Brain: No evidence of acute infarction, hemorrhage, hydrocephalus, extra-axial collection or mass lesion/mass effect. Mild low-density in the cerebral white matter. Normal brain volume Vascular: No hyperdense vessel or unexpected calcification. Skull: Normal. Negative for fracture or focal lesion. Sinuses/Orbits: No acute finding. IMPRESSION: No acute finding or explanation for symptoms. Electronically Signed   By: Tiburcio Pea M.D.   On: 08/17/2023  06:27    Procedures Procedures    Medications Ordered in ED Medications  lactated ringers bolus 1,000 mL (has no administration in time range)  ondansetron (ZOFRAN) injection 4 mg (has no administration in time range)  ondansetron (ZOFRAN-ODT) disintegrating tablet 4 mg (4 mg Oral Given 08/17/23 6045)    ED Course/ Medical Decision Making/ A&P                                 Medical Decision Making Amount and/or Complexity of  Data Reviewed Labs: ordered. Decision-making details documented in ED Course. Radiology: ordered and independent interpretation performed. Decision-making details documented in ED Course. ECG/medicine tests: ordered and independent interpretation performed. Decision-making details documented in ED Course.  Risk Prescription drug management.  2 days of nausea, vomiting, abdominal pain.  Abdomen soft peritoneal signs.  IV fluids given, pain and nausea medications.  Vital stable, no distress, abdomen soft without peritoneal signs.  Patient believes this is due to not having her medications for anxiety  IV fluids, nausea medications and anxiety medications.  EKG is sinus rhythm without acute ST changes.  Suspect her symptoms are likely secondary to not having her usual medications.  Labs are reassuring with normal LFTs, lipase, anion gap, troponin.  Urinalysis is negative.  Lithium level is therapeutic.  Discussed with patient's son Denyse Amass by phone.  He agrees and states patient will have her medications later today.  Workup negative for emergent pathology or cause of her abdominal pain, nausea vomiting.  She appears stable for discharge.  Will initiate clear liquid diet and advance slowly as tolerated.        Final Clinical Impression(s) / ED Diagnoses Final diagnoses:  Nausea and vomiting, unspecified vomiting type    Rx / DC Orders ED Discharge Orders     None         Haruto Demaria, Jeannett Senior, MD 08/17/23 [phone]

## 2023-08-17 NOTE — Discharge Instructions (Addendum)
Testing is reassuring.  Take your medications as prescribed when they arrive today.  Start with a clear liquid diet and advance slowly as tolerated.  Return to the ED with new or worsening symptoms.

## 2023-08-25 ENCOUNTER — Ambulatory Visit: Payer: Medicare (Managed Care)

## 2023-08-25 ENCOUNTER — Telehealth: Payer: Self-pay

## 2023-08-25 VITALS — Ht 60.0 in | Wt 195.0 lb

## 2023-08-25 DIAGNOSIS — Z1211 Encounter for screening for malignant neoplasm of colon: Secondary | ICD-10-CM

## 2023-08-25 MED ORDER — PEG 3350-KCL-NA BICARB-NACL 420 G PO SOLR: 4000.0000 mL | Freq: Once | ORAL | 0 refills | Status: AC

## 2023-08-25 NOTE — Telephone Encounter (Signed)
No answer for 1030 PV

## 2023-08-25 NOTE — Telephone Encounter (Signed)
Called mobile phone [phone].  Busy signal and disconnects.

## 2023-08-25 NOTE — Telephone Encounter (Signed)
Patient came in person to complete PV.  Pv completed

## 2023-08-25 NOTE — Progress Notes (Signed)
No egg or soy allergy known to patient  No issues known to pt with past sedation with any surgeries or procedures Patient denies ever being told they had issues or difficulty with intubation  No FH of Malignant Hyperthermia Pt is not on diet pills Pt is not on  home 02  Pt is not on blood thinners  Pt denies issues with constipation  No A fib or A flutter Have any cardiac testing pending--no Pt can ambulate with assistance Pt denies use of chewing tobacco Discussed diabetic I weight loss medication holds Discussed NSAID holds Checked BMI Pt instructed to use [employer] or GoodRx for a price reduction on prep  Patient's chart reviewed by Cathlyn Parsons CNRA prior to previsit and patient appropriate for the [facility].  Pre visit completed and red dot placed by patient's name on their procedure day (on provider's schedule).

## 2023-09-11 ENCOUNTER — Encounter: Payer: Self-pay | Admitting: Certified Registered Nurse Anesthetist

## 2023-09-15 ENCOUNTER — Ambulatory Visit: Payer: Medicare (Managed Care) | Admitting: Internal Medicine

## 2023-09-15 ENCOUNTER — Encounter: Payer: Self-pay | Admitting: Internal Medicine

## 2023-09-15 VITALS — BP 132/75 | HR 82 | Temp 98.0°F | Ht 60.0 in | Wt 195.0 lb

## 2023-09-15 DIAGNOSIS — Z1211 Encounter for screening for malignant neoplasm of colon: Secondary | ICD-10-CM

## 2023-09-15 MED ORDER — SODIUM CHLORIDE 0.9 % IV SOLN: 500.0000 mL | INTRAVENOUS | Status: AC

## 2023-09-15 NOTE — Progress Notes (Signed)
Patient stated she was only able to keep 50% of prep down and stools are still brown with solid stool present.  Dr. Rhea Belton wants patient scheduled for office visit first.

## 2023-12-02 ENCOUNTER — Emergency Department (HOSPITAL_COMMUNITY): Payer: Medicare (Managed Care)

## 2023-12-02 ENCOUNTER — Other Ambulatory Visit: Payer: Self-pay

## 2023-12-02 ENCOUNTER — Emergency Department (HOSPITAL_COMMUNITY)
Admission: EM | Admit: 2023-12-02 | Discharge: 2023-12-02 | Disposition: A | Discharge status: Home / Self Care | Payer: Medicare (Managed Care) | Attending: Emergency Medicine | Admitting: Emergency Medicine

## 2023-12-02 DIAGNOSIS — T84032A Mechanical loosening of internal right knee prosthetic joint, initial encounter: Secondary | ICD-10-CM | POA: Insufficient documentation

## 2023-12-02 DIAGNOSIS — Z96651 Presence of right artificial knee joint: Secondary | ICD-10-CM | POA: Diagnosis not present

## 2023-12-02 DIAGNOSIS — W050XXA Fall from non-moving wheelchair, initial encounter: Secondary | ICD-10-CM | POA: Insufficient documentation

## 2023-12-02 DIAGNOSIS — R410 Disorientation, unspecified: Secondary | ICD-10-CM | POA: Diagnosis not present

## 2023-12-02 DIAGNOSIS — E860 Dehydration: Secondary | ICD-10-CM | POA: Insufficient documentation

## 2023-12-02 DIAGNOSIS — T783XXA Angioneurotic edema, initial encounter: Secondary | ICD-10-CM | POA: Diagnosis not present

## 2023-12-02 DIAGNOSIS — Y792 Prosthetic and other implants, materials and accessory orthopedic devices associated with adverse incidents: Secondary | ICD-10-CM | POA: Insufficient documentation

## 2023-12-02 DIAGNOSIS — Z79899 Other long term (current) drug therapy: Secondary | ICD-10-CM | POA: Insufficient documentation

## 2023-12-02 DIAGNOSIS — S0081XA Abrasion of other part of head, initial encounter: Secondary | ICD-10-CM | POA: Insufficient documentation

## 2023-12-02 DIAGNOSIS — K115 Sialolithiasis: Secondary | ICD-10-CM | POA: Insufficient documentation

## 2023-12-02 DIAGNOSIS — I6782 Cerebral ischemia: Secondary | ICD-10-CM | POA: Diagnosis not present

## 2023-12-02 DIAGNOSIS — S0083XA Contusion of other part of head, initial encounter: Secondary | ICD-10-CM | POA: Diagnosis present

## 2023-12-02 DIAGNOSIS — W19XXXA Unspecified fall, initial encounter: Secondary | ICD-10-CM

## 2023-12-02 DIAGNOSIS — M79604 Pain in right leg: Secondary | ICD-10-CM | POA: Diagnosis not present

## 2023-12-02 LAB — COMPREHENSIVE METABOLIC PANEL
ALT: 14 U/L (ref 0–44)
AST: 27 U/L (ref 15–41)
Albumin: 3.7 g/dL (ref 3.5–5.0)
Alkaline Phosphatase: 62 U/L (ref 38–126)
Anion gap: 10 (ref 5–15)
BUN: 28 mg/dL — ABNORMAL HIGH (ref 8–23)
CO2: 24 mmol/L (ref 22–32)
Calcium: 10.2 mg/dL (ref 8.9–10.3)
Chloride: 98 mmol/L (ref 98–111)
Creatinine, Ser: 1.49 mg/dL — ABNORMAL HIGH (ref 0.44–1.00)
GFR, Estimated: 37 mL/min — ABNORMAL LOW (ref >60)
Glucose, Bld: 110 mg/dL — ABNORMAL HIGH (ref 70–99)
Potassium: 3.7 mmol/L (ref 3.5–5.1)
Sodium: 132 mmol/L — ABNORMAL LOW (ref 135–145)
Total Bilirubin: 0.6 mg/dL (ref 0.0–1.2)
Total Protein: 8.5 g/dL — ABNORMAL HIGH (ref 6.5–8.1)

## 2023-12-02 LAB — URINALYSIS, W/ REFLEX TO CULTURE (INFECTION SUSPECTED)
Bacteria, UA: NONE SEEN
Bilirubin Urine: NEGATIVE
Glucose, UA: NEGATIVE mg/dL
Hgb urine dipstick: NEGATIVE
Ketones, ur: NEGATIVE mg/dL
Leukocytes,Ua: NEGATIVE
Nitrite: NEGATIVE
Protein, ur: NEGATIVE mg/dL
RBC / HPF: NONE SEEN RBC/hpf (ref 0–5)
Specific Gravity, Urine: 1.025 (ref 1.005–1.030)
WBC, UA: NONE SEEN WBC/hpf (ref 0–5)
pH: 5.5 (ref 5.0–8.0)

## 2023-12-02 LAB — CBC WITH DIFFERENTIAL/PLATELET
Abs Immature Granulocytes: 0.06 10*3/uL (ref 0.00–0.07)
Basophils Absolute: 0 10*3/uL (ref 0.0–0.1)
Basophils Relative: 0 %
Eosinophils Absolute: 0.1 10*3/uL (ref 0.0–0.5)
Eosinophils Relative: 1 %
HCT: 30.9 % — ABNORMAL LOW (ref 36.0–46.0)
Hemoglobin: 9.7 g/dL — ABNORMAL LOW (ref 12.0–15.0)
Immature Granulocytes: 1 %
Lymphocytes Relative: 10 %
Lymphs Abs: 0.9 10*3/uL (ref 0.7–4.0)
MCH: 26.9 pg (ref 26.0–34.0)
MCHC: 31.4 g/dL (ref 30.0–36.0)
MCV: 85.8 fL (ref 80.0–100.0)
Monocytes Absolute: 0.7 10*3/uL (ref 0.1–1.0)
Monocytes Relative: 8 %
Neutro Abs: 7 10*3/uL (ref 1.7–7.7)
Neutrophils Relative %: 80 %
Platelets: 306 10*3/uL (ref 150–400)
RBC: 3.6 MIL/uL — ABNORMAL LOW (ref 3.87–5.11)
RDW: 13.5 % (ref 11.5–15.5)
WBC: 8.7 10*3/uL (ref 4.0–10.5)
nRBC: 0 % (ref 0.0–0.2)

## 2023-12-02 LAB — I-STAT CHEM 8, ED
BUN: 29 mg/dL — ABNORMAL HIGH (ref 8–23)
Calcium, Ion: 1.34 mmol/L (ref 1.15–1.40)
Chloride: 100 mmol/L (ref 98–111)
Creatinine, Ser: 1.5 mg/dL — ABNORMAL HIGH (ref 0.44–1.00)
Glucose, Bld: 105 mg/dL — ABNORMAL HIGH (ref 70–99)
HCT: 31 % — ABNORMAL LOW (ref 36.0–46.0)
Hemoglobin: 10.5 g/dL — ABNORMAL LOW (ref 12.0–15.0)
Potassium: 3.7 mmol/L (ref 3.5–5.1)
Sodium: 135 mmol/L (ref 135–145)
TCO2: 26 mmol/L (ref 22–32)

## 2023-12-02 LAB — RESP PANEL BY RT-PCR (RSV, FLU A&B, COVID)  RVPGX2
Influenza A by PCR: NEGATIVE
Influenza B by PCR: NEGATIVE
Resp Syncytial Virus by PCR: NEGATIVE
SARS Coronavirus 2 by RT PCR: NEGATIVE

## 2023-12-02 LAB — RAPID URINE DRUG SCREEN, HOSP PERFORMED
Amphetamines: NOT DETECTED
Barbiturates: NOT DETECTED
Benzodiazepines: NOT DETECTED
Cocaine: NOT DETECTED
Opiates: NOT DETECTED
Tetrahydrocannabinol: NOT DETECTED

## 2023-12-02 LAB — AMMONIA: Ammonia: 16 umol/L (ref 9–35)

## 2023-12-02 LAB — ETHANOL: Alcohol, Ethyl (B): <10 mg/dL (ref <10)

## 2023-12-02 LAB — I-STAT CG4 LACTIC ACID, ED: Lactic Acid, Venous: 1.5 mmol/L (ref 0.5–1.9)

## 2023-12-02 MED ORDER — SODIUM CHLORIDE 0.9 % IV BOLUS
1000.0000 mL | Freq: Once | INTRAVENOUS | Status: AC
  Administered 2023-12-02: 1000 mL via INTRAVENOUS

## 2023-12-02 NOTE — Discharge Instructions (Addendum)
 Swelling secondary to lisinopril.  Please stop lisinopril immediately and do not take any ACE inhibitors in the future.

## 2023-12-02 NOTE — ED Notes (Signed)
 Patient resting in bed, no s/s of distress.

## 2023-12-02 NOTE — ED Provider Notes (Signed)
 [place] EMERGENCY DEPARTMENT AT [facility] Provider Note   CSN: [id number] Arrival date & time: 12/02/23  9676     History  Chief Complaint  Patient presents with   Fall    Pt fell out of her wheelchair and hit her head on carpet. Pt does not remember fall. Per EMS, family states patient is more confused than normal. Pt also has swollen tongue due to lisinopril which has caused changes in her sleep (has happened before). Pt a/o x4. Pt c/o pain of right leg.     Brenda Murray is a 73 y.o. female.  Patient presents to the emergency department for evaluation after a fall.  Patient reportedly slid out of her chair to the ground.  She comes from independent living by EMS.  EMS was told that the family has been concerned that the patient has been more confused than usual for the last 2 days.  Patient complaining of right leg pain at arrival.  EM also reports that the patient reportedly had tongue swelling secondary to lisinopril earlier this week.  The lisinopril was stopped for 2 days and the swelling improved but then she restarted the lisinopril today and her tongue is swollen again.       Home Medications Prior to Admission medications   Medication Sig Start Date End Date Taking? Authorizing Provider  acetaminophen  (TYLENOL ) 650 MG CR tablet Take 1,300 mg by mouth every 12 (twelve) hours.    [provider]  antiseptic oral rinse (BIOTENE) LIQD 15 mLs by Mouth Rinse route as needed for dry mouth.    [provider]  Carboxymethylcell-Hypromellose (GENTEAL OP) Place 1 drop into both eyes 2 (two) times daily.    [provider]  Cyanocobalamin  (B-12) 50 MCG TABS Take by mouth. 06/25/15   [provider]  diclofenac  Sodium (VOLTAREN ) 1 % GEL Apply 4 g topically 4 (four) times daily. 08/13/21   Lamptey, Aleene KIDD, MD  dicyclomine  (BENTYL ) 20 MG tablet Take 1 tablet (20 mg total) by mouth 2 (two) times daily as needed (abdominal pain).  11/07/20   Haviland, Julie, MD  famotidine (PEPCID) 20 MG tablet Take 20 mg by mouth daily.    [provider]  fluticasone  (FLONASE ) 50 MCG/ACT nasal spray Place 1 spray into both nostrils daily.    [provider]  guaiFENesin  (ROBITUSSIN) 100 MG/5ML liquid Take 200 mg by mouth every 4 (four) hours as needed for cough.    [provider]  LORazepam  (ATIVAN ) 1 MG tablet Take 1 mg by mouth at bedtime. 07/23/17   [provider]  Melatonin 5 MG CAPS Take 5 mg by mouth at bedtime.    [provider]  Multiple Vitamins-Minerals (PRESERVISION AREDS 2+MULTI VIT PO) Take 1 capsule by mouth daily.    [provider]  NEURONTIN  300 MG capsule Take 300 mg by mouth 2 (two) times daily.  01/16/20   [provider]  OLANZapine  (ZYPREXA ) 10 MG tablet Take 10 mg by mouth in the morning and at bedtime.    [provider]  ondansetron  (ZOFRAN -ODT) 4 MG disintegrating tablet Take 1 tablet (4 mg total) by mouth every 8 (eight) hours as needed for nausea or vomiting. 08/17/23   Rancour, Garnette, MD  sodium chloride  (OCEAN) 0.65 % SOLN nasal spray Place 1 spray into both nostrils as needed for congestion. Patient not taking: Reported on 08/25/2023 12/06/17   Burky, Natalie B, NP      Allergies  Lisinopril    Review of Systems   Review of Systems  Physical Exam Updated Vital Signs BP (!) 103/59   Pulse 81   Temp (!) 97.5 F (36.4 C) (Oral)   Resp (!) 27   SpO2 99%  Physical Exam Vitals and nursing note reviewed.  Constitutional:      General: She is not in acute distress.    Appearance: She is well-developed.  HENT:     Head: Normocephalic. Abrasion present.      Mouth/Throat:     Mouth: Mucous membranes are moist.     Comments: Tongue swelling Eyes:     General: Vision grossly intact. Gaze aligned appropriately.     Extraocular Movements: Extraocular movements intact.     Conjunctiva/sclera: Conjunctivae normal.   Cardiovascular:     Rate and Rhythm: Normal rate and regular rhythm.     Pulses: Normal pulses.     Heart sounds: Normal heart sounds, S1 normal and S2 normal. No murmur heard.    No friction rub. No gallop.  Pulmonary:     Effort: Pulmonary effort is normal. No respiratory distress.     Breath sounds: Normal breath sounds.  Abdominal:     General: Bowel sounds are normal.     Palpations: Abdomen is soft.     Tenderness: There is no abdominal tenderness. There is no guarding or rebound.     Hernia: No hernia is present.  Musculoskeletal:        General: No swelling.     Cervical back: Full passive range of motion without pain, normal range of motion and neck supple. No spinous process tenderness or muscular tenderness. Normal range of motion.     Right knee: No swelling or deformity. Tenderness present.     Right lower leg: No edema.     Left lower leg: No edema.  Skin:    General: Skin is warm and dry.     Capillary Refill: Capillary refill takes less than 2 seconds.     Findings: No ecchymosis, erythema, rash or wound.  Neurological:     General: No focal deficit present.     Mental Status: She is alert and oriented to person, place, and time.     GCS: GCS eye subscore is 4. GCS verbal subscore is 5. GCS motor subscore is 6.     Cranial Nerves: Cranial nerves 2-12 are intact.     Sensory: Sensation is intact.     Motor: Motor function is intact.     Coordination: Coordination is intact.  Psychiatric:        Attention and Perception: Attention normal.        Mood and Affect: Mood normal.        Speech: Speech normal.        Behavior: Behavior normal.     ED Results / Procedures / Treatments   Labs (all labs ordered are listed, but only abnormal results are displayed) Labs Reviewed  CBC WITH DIFFERENTIAL/PLATELET - Abnormal; Notable for the following components:      Result Value   RBC 3.60 (*)    Hemoglobin 9.7 (*)    HCT 30.9 (*)    All other components within  normal limits  COMPREHENSIVE METABOLIC PANEL - Abnormal; Notable for the following components:   Sodium 132 (*)    Glucose, Bld 110 (*)    BUN 28 (*)    Creatinine, Ser 1.49 (*)    Total Protein 8.5 (*)  GFR, Estimated 37 (*)    All other components within normal limits  I-STAT CHEM 8, ED - Abnormal; Notable for the following components:   BUN 29 (*)    Creatinine, Ser 1.50 (*)    Glucose, Bld 105 (*)    Hemoglobin 10.5 (*)    HCT 31.0 (*)    All other components within normal limits  RESP PANEL BY RT-PCR (RSV, FLU A&B, COVID)  RVPGX2  CULTURE, BLOOD (SINGLE)  ETHANOL  AMMONIA  URINALYSIS, W/ REFLEX TO CULTURE (INFECTION SUSPECTED)  RAPID URINE DRUG SCREEN, HOSP PERFORMED  I-STAT CG4 LACTIC ACID, ED  I-STAT CG4 LACTIC ACID, ED    EKG EKG Interpretation Date/Time:  Friday December 02 2023 04:05:55 EST Ventricular Rate:  78 PR Interval:  211 QRS Duration:  73 QT Interval:  362 QTC Calculation: 413 R Axis:   48  Text Interpretation: Sinus rhythm Minimal ST elevation, inferior leads No significant change since last tracing Confirmed by Haze Lonni PARAS [phone]) on 12/02/2023 4:10:00 AM  Radiology CT CERVICAL SPINE WO CONTRAST Result Date: 12/02/2023 CLINICAL DATA:  73 year old female status post fall. Fall from wheelchair striking head. EXAM: CT CERVICAL SPINE WITHOUT CONTRAST TECHNIQUE: Multidetector CT imaging of the cervical spine was performed without intravenous contrast. Multiplanar CT image reconstructions were also generated. RADIATION DOSE REDUCTION: This exam was performed according to the departmental dose-optimization program which includes automated exposure control, adjustment of the mA and/or kV according to patient size and/or use of iterative reconstruction technique. COMPARISON:  Head CT today.  Cervical spine CT 06/15/2022. FINDINGS: Alignment: Chronic reversal of cervical lordosis is stable. Cervicothoracic junction alignment is within normal limits.  Bilateral posterior element alignment is within normal limits. Skull base and vertebrae: Bone mineralization is stable since 2023, degenerative appearing mild heterogeneity of the cervical vertebrae. Visualized skull base is intact. No atlanto-occipital dissociation. C1 and C2 appears stable and intact. No acute osseous abnormality identified. Soft tissues and spinal canal: No prevertebral fluid or swelling. No visible canal hematoma. Visible parotid gland heterogeneity appears unchanged since 2023, benign, and includes some bilateral punctate parotid sialolithiasis. Otherwise negative visible noncontrast neck soft tissues. Disc levels: Chronic cervical spine disc and endplate degeneration appears stable. Less pronounced cervical facet degeneration primarily on the right at C4-C5. Upper chest: Minimally included. IMPRESSION: 1. No acute traumatic injury identified in the cervical spine. 2. Stable chronic cervical spine degeneration. 3. Stable since 2023 and benign appearing heterogeneity of the bilateral parotid glands, with underlying sialolithiasis. Electronically Signed   By: VEAR Hurst M.D.   On: 12/02/2023 05:04   CT HEAD WO CONTRAST ( ) Result Date: 12/02/2023 CLINICAL DATA:  73 year old female status post fall. Fall from wheelchair striking head. EXAM: CT HEAD WITHOUT CONTRAST TECHNIQUE: Contiguous axial images were obtained from the base of the skull through the vertex without intravenous contrast. RADIATION DOSE REDUCTION: This exam was performed according to the departmental dose-optimization program which includes automated exposure control, adjustment of the mA and/or kV according to patient size and/or use of iterative reconstruction technique. COMPARISON:  Head CT 08/17/2023. FINDINGS: Brain: Cerebral volume is within normal limits for age. No midline shift, ventriculomegaly, mass effect, evidence of mass lesion, intracranial hemorrhage or evidence of cortically based acute infarction. Gray-white  differentiation is stable, with mild to moderate patchy white matter hypodensity more so in the left hemisphere. Some deep white matter capsule involvement appears stable. Deep gray nuclei otherwise within normal limits. Vascular: No suspicious intracranial vascular hyperdensity. Skull: Stable and intact.  No fracture identified. Sinuses/Orbits: Visualized paranasal sinuses and mastoids are stable and well aerated. Other: Left forehead scalp hematoma, series 3, image 40 measures up to 3-4 mm in thickness. Underlying left frontal and sinus appears stable and intact. No scalp soft tissue gas. No other orbit or scalp soft tissue injury identified. There is chronic mild bilateral parotid gland heterogeneity, with punctate sialolithiasis in the right parotid. See cervical spine CT today reported separately. IMPRESSION: 1. Left forehead scalp hematoma. No underlying skull fracture. 2. Stable non contrast CT appearance of brain with up to moderate white matter disease most commonly due to small vessel ischemia. 3. Parotid sialolithiasis. Electronically Signed   By: VEAR Hurst M.D.   On: 12/02/2023 05:00   DG Knee Complete 4 Views Right Result Date: 12/02/2023 CLINICAL DATA:  73 year old female status post fall. EXAM: RIGHT KNEE - COMPLETE 4+ VIEW COMPARISON:  Right knee series 06/18/2019. FINDINGS: Cross-table AP and 2 lateral views at 0357 hours. Chronic total knee arthroplasty with distal femur and proximal tibia implants stems, both demonstrating evidence of chronic loosening as on the 2020 comparison. But the hardware alignment appears stable. No acute fracture is identified. 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 Stable hardware alignment and no acute fracture identified. Electronically Signed   By: VEAR Hurst M.D.   On: 12/02/2023 04:36   DG Hip Unilat W or Wo Pelvis 2-3 Views Right Result Date: 12/02/2023 CLINICAL DATA:  73 year old female status post fall. EXAM: DG HIP  (WITH OR WITHOUT PELVIS) 2-3V RIGHT COMPARISON:  Pelvis and hip series 12/20/2019. FINDINGS: Portable AP supine views at 0353 hours. Pelvis is oblique to the right. Femoral heads remain normally located. No pelvis fracture identified. Proximal right femur appears intact. Grossly intact proximal left femur. Negative bowel gas pattern. IMPRESSION: No acute fracture or dislocation identified about the pelvis or right hip. Electronically Signed   By: VEAR Hurst M.D.   On: 12/02/2023 04:33   DG Chest Port 1 View Result Date: 12/02/2023 CLINICAL DATA:  73 year old female status post fall. EXAM: PORTABLE CHEST 1 VIEW COMPARISON:  Portable chest 07/06/2022 and earlier. FINDINGS: Portable AP semi upright view at 0351 hours. Low lung volumes similar to priors. Normal cardiac size and mediastinal contours. Visualized tracheal air column is within normal limits. Allowing for portable technique the lungs are clear. No pneumothorax or pleural effusion. Chronic and degenerative appearing asymmetric sclerosis of the right glenohumeral osseous structures. No acute osseous abnormality identified. Negative visible bowel gas. IMPRESSION: No acute cardiopulmonary abnormality or acute traumatic injury identified. Electronically Signed   By: VEAR Hurst M.D.   On: 12/02/2023 04:32    Procedures Procedures    Medications Ordered in ED Medications  sodium chloride  0.9 % bolus 1,000 mL (1,000 mLs Intravenous New Bag/Given 12/02/23 0555)    ED Course/ Medical Decision Making/ A&P                                 Medical Decision Making Amount and/or Complexity of Data Reviewed Labs: ordered. Radiology: ordered.   Presents to the urgency department for evaluation after a fall.  She slid out of her chair to the ground.  Patient with a small abrasion on the left forehead but no other obvious signs of injury.  She complains of right knee pain but this is chronic secondary to prior knee replacement surgery.  Patient with obvious  tongue swelling.  This  has been present for a couple of days.  She takes lisinopril as needed for blood pressure, last dose was yesterday.  Tongue swelling started after that dose and is likely secondary to ACE inhibitor.  Discussed workup with patient's son, Corey Torain.  He does confirm that the patient does not walk and has chronic pain in the right leg. Patient appropriate for discharge, will be transported by [facility].  Son making arrangements for someone to be at the home when she gets there.          Final Clinical Impression(s) / ED Diagnoses Final diagnoses:  Fall, initial encounter  Contusion of face, initial encounter  Dehydration  Angioedema, initial encounter    Rx / DC Orders ED Discharge Orders     None         Jann Milkovich, Lonni PARAS, MD 12/02/23 0700

## 2023-12-07 LAB — CULTURE, BLOOD (SINGLE)
Culture: NO GROWTH
Special Requests: ADEQUATE

## 2023-12-15 ENCOUNTER — Encounter: Payer: Self-pay | Admitting: Internal Medicine

## 2023-12-15 ENCOUNTER — Ambulatory Visit (INDEPENDENT_AMBULATORY_CARE_PROVIDER_SITE_OTHER): Payer: Medicare (Managed Care) | Admitting: Internal Medicine

## 2023-12-15 VITALS — BP 124/76 | HR 70 | Ht 60.0 in | Wt 186.0 lb

## 2023-12-15 DIAGNOSIS — R1310 Dysphagia, unspecified: Secondary | ICD-10-CM | POA: Diagnosis not present

## 2023-12-15 DIAGNOSIS — Z9181 History of falling: Secondary | ICD-10-CM

## 2023-12-15 DIAGNOSIS — Z1211 Encounter for screening for malignant neoplasm of colon: Secondary | ICD-10-CM

## 2023-12-15 DIAGNOSIS — K219 Gastro-esophageal reflux disease without esophagitis: Secondary | ICD-10-CM

## 2023-12-15 NOTE — Patient Instructions (Signed)
Your provider has ordered Cologuard testing as an option for colon cancer screening. This is performed by [employer] and may be out of network with your insurance. PRIOR to completing the test, it is YOUR responsibility to contact your insurance about covered benefits for this test. Your out of pocket expense could be anywhere from $0.00 to $649.00.   When you call to check coverage with your insurer, please provide the following information:   -The ONLY provider of Cologuard is [facility], dispensing  - CPT code for Cologuard is [phone].  [employer] NPI # [id number]  -[employer] Tax ID # [id number]   We have already sent your demographic and insurance information to [employer] (phone number [phone]) and they should contact you within the next week regarding your test. If you have not heard from them within the next week, please call our office at [phone].  You have been scheduled for a Barium Esophogram at [facility]iology (1st floor of the hospital) on 12/23/23 at 9:00am. Please arrive 30 minutes prior to your appointment for registration. Make certain not to have anything to eat or drink 3 hours prior to your test. If you need to reschedule for any reason, please contact radiology at [phone] to do so. __________________________________________________________________ A barium swallow is an examination that concentrates on views of the esophagus. This tends to be a double contrast exam (barium and two liquids which, when combined, create a gas to distend the wall of the oesophagus) or single contrast (non-ionic iodine based). The study is usually tailored to your symptoms so a good history is essential. Attention is paid during the study to the form, structure and configuration of the esophagus, looking for functional disorders (such as aspiration, dysphagia, achalasia, motility and reflux) EXAMINATION You may be asked to change into  a gown, depending on the type of swallow being performed. A radiologist and radiographer will perform the procedure. The radiologist will advise you of the type of contrast selected for your procedure and direct you during the exam. You will be asked to stand, sit or lie in several different positions and to hold a small amount of fluid in your mouth before being asked to swallow while the imaging is performed .In some instances you may be asked to swallow barium coated marshmallows to assess the motility of a solid food bolus. The exam can be recorded as a digital or video fluoroscopy procedure. POST PROCEDURE It will take 1-2 days for the barium to pass through your system. To facilitate this, it is important, unless otherwise directed, to increase your fluids for the next 24-48hrs and to resume your normal diet.  This test typically takes about 30 minutes to perform. __________________________________________________________________________________   If your blood pressure at your visit was 140/90 or greater, please contact your primary care physician to follow up on this.  _______________________________________________________  If you are age 36 or older, your body mass index should be between 23-30. Your Body mass index is 36.33 kg/m. If this is out of the aforementioned range listed, please consider follow up with your Primary Care Provider.  If you are age 38 or younger, your body mass index should be between 19-25. Your Body mass index is 36.33 kg/m. If this is out of the aformentioned range listed, please consider follow up with your Primary Care Provider.   ________________________________________________________  The  GI providers would like to encourage you to use [facility] to communicate with providers for non-urgent requests or  questions.  Due to long hold times on the telephone, sending your provider a message by [facility] may be a faster and more efficient way to get a  response.  Please allow 48 business hours for a response.  Please remember that this is for non-urgent requests.  _______________________________________________________

## 2023-12-15 NOTE — Progress Notes (Signed)
Patient ID: Brenda Murray, female   DOB: [birth year], 73 y.o.   MRN: [id number] HPI: Brenda Murray is a 73 year old female with a history of colonic diverticulosis, hypothyroidism, obesity, hyperlipidemia GERD presents to discuss intermittent trouble swallowing and colon cancer screening.  She is here alone today.    She attempted colonoscopy prep but this resulted in vomiting and very poor clearance and thus her colonoscopy initially scheduled for October was canceled before even being attempted.    The patient reported difficulty swallowing, particularly with pills, which she often has to help down with a lot of water. She also noted occasional difficulty swallowing food, especially when eating too fast. The patient has a history of dental issues and currently lacks bottom teeth, which may contribute to her swallowing difficulties.  The patient also reported a recent fall, which resulted in a hospital visit. Since the fall, she has noticed a sore spot in her abdomen, described as a 'knot.' However, a CT scan of the abdomen performed in September showed no abnormalities. The patient has regular bowel movements and reported no issues with bowel function.  The patient's last colonoscopy was ten years ago with Dr. Loreta Ave. Given her age of 46, the patient was considering another colon cancer screening.   She denies a family history of colon cancer   Past Medical History:  Diagnosis Date   Allergy    Anxiety    Bipolar disorder (HCC)    Cataract    Chronic back pain    History of indigestion    Hypothyroidism     Past Surgical History:  Procedure Laterality Date   carpel tunnel     ESOPHAGOGASTRODUODENOSCOPY N/A 11/06/2014   Procedure: ESOPHAGOGASTRODUODENOSCOPY (EGD);  Surgeon: Charna Elizabeth, MD;  Location: [facility] ENDOSCOPY;  Service: Endoscopy;  Laterality: N/A;   JOINT REPLACEMENT     bilat knee   TONSILLECTOMY     TOTAL KNEE REVISION  05/01/2012   Procedure: TOTAL KNEE REVISION;  Surgeon:  Raymon Mutton, MD;  Location: [facility] OR;  Service: Orthopedics;  Laterality: Right;    Outpatient Medications Prior to Visit  Medication Sig Dispense Refill   acetaminophen (TYLENOL) 650 MG CR tablet Take 1,300 mg by mouth every 12 (twelve) hours.     antiseptic oral rinse (BIOTENE) LIQD 15 mLs by Mouth Rinse route as needed for dry mouth.     Carboxymethylcell-Hypromellose (GENTEAL OP) Place 1 drop into both eyes 2 (two) times daily.     Cyanocobalamin (B-12) 50 MCG TABS Take by mouth.     diclofenac Sodium (VOLTAREN) 1 % GEL Apply 4 g topically 4 (four) times daily. 350 g 0   dicyclomine (BENTYL) 20 MG tablet Take 1 tablet (20 mg total) by mouth 2 (two) times daily as needed (abdominal pain). 20 tablet 0   famotidine (PEPCID) 20 MG tablet Take 20 mg by mouth daily.     fluticasone (FLONASE) 50 MCG/ACT nasal spray Place 1 spray into both nostrils daily.     guaiFENesin (ROBITUSSIN) 100 MG/5ML liquid Take 200 mg by mouth every 4 (four) hours as needed for cough.     LORazepam (ATIVAN) 1 MG tablet Take 1 mg by mouth at bedtime.     Melatonin 5 MG CAPS Take 5 mg by mouth at bedtime.     Multiple Vitamins-Minerals (PRESERVISION AREDS 2+MULTI VIT PO) Take 1 capsule by mouth daily.     NEURONTIN 300 MG capsule Take 300 mg by mouth 2 (two) times daily.  OLANZapine (ZYPREXA) 10 MG tablet Take 10 mg by mouth in the morning and at bedtime.     ondansetron (ZOFRAN-ODT) 4 MG disintegrating tablet Take 1 tablet (4 mg total) by mouth every 8 (eight) hours as needed for nausea or vomiting. 20 tablet 0   sertraline (ZOLOFT) 50 MG tablet Take 50 mg by mouth daily.     sodium chloride (OCEAN) 0.65 % SOLN nasal spray Place 1 spray into both nostrils as needed for congestion. (Patient not taking: Reported on 08/25/2023) 60 mL 0   No facility-administered medications prior to visit.    Allergies  Allergen Reactions   Lisinopril Swelling    Family History  Problem Relation Age of Onset   Breast cancer  Neg Hx    Colon cancer Neg Hx    Colon polyps Neg Hx    Esophageal cancer Neg Hx    Rectal cancer Neg Hx    Stomach cancer Neg Hx     Social History   Tobacco Use   Smoking status: Former    Types: Cigarettes   Smokeless tobacco: Never  Vaping Use   Vaping status: Never Used  Substance Use Topics   Alcohol use: No   Drug use: No    ROS: As per history of present illness, otherwise negative  BP 124/76   Pulse 70   Ht 5' (1.524 m)   Wt 186 lb (84.4 kg)   SpO2 98%   BMI 36.33 kg/m  Gen: awake, alert, NAD HEENT: anicteric  CV: RRR, no mrg Pulm: CTA b/l Abd: soft, obese, NT/ND, +BS throughout Ext: no c/c/e Neuro: nonfocal   RELEVANT LABS AND IMAGING: CBC    Component Value Date/Time   WBC 8.7 12/02/2023 0413   RBC 3.60 (L) 12/02/2023 0413   HGB 10.5 (L) 12/02/2023 0429   HCT 31.0 (L) 12/02/2023 0429   PLT 306 12/02/2023 0413   MCV 85.8 12/02/2023 0413   MCH 26.9 12/02/2023 0413   MCHC 31.4 12/02/2023 0413   RDW 13.5 12/02/2023 0413   LYMPHSABS 0.9 12/02/2023 0413   MONOABS 0.7 12/02/2023 0413   EOSABS 0.1 12/02/2023 0413   BASOSABS 0.0 12/02/2023 0413    CMP     Component Value Date/Time   NA 135 12/02/2023 0429   K 3.7 12/02/2023 0429   CL 100 12/02/2023 0429   CO2 24 12/02/2023 0413   GLUCOSE 105 (H) 12/02/2023 0429   BUN 29 (H) 12/02/2023 0429   CREATININE 1.50 (H) 12/02/2023 0429   CALCIUM 10.2 12/02/2023 0413   PROT 8.5 (H) 12/02/2023 0413   ALBUMIN 3.7 12/02/2023 0413   AST 27 12/02/2023 0413   ALT 14 12/02/2023 0413   ALKPHOS 62 12/02/2023 0413   BILITOT 0.6 12/02/2023 0413   GFRNONAA 37 (L) 12/02/2023 0413   GFRAA >60 07/21/2020 0800     ASSESSMENT/PLAN:  73 year old female with a history of colonic diverticulosis, hypothyroidism, obesity, hyperlipidemia GERD presents to discuss intermittent trouble swallowing and colon cancer screening.   Colon Cancer Screening Failed colonoscopy prep in October. CT scan in September showed no  abnormalities in the colon. Discussed alternative screening method due to difficulty with colonoscopy prep. -Order Cologuard test to be sent to patient's home for completion.  Dysphagia with hx of GERD Reports difficulty swallowing pills and occasional difficulty with food. No evidence of weight loss or malnutrition. -Order barium swallow study to evaluate for any anatomical abnormalities.  Fall Risk Recent fall 13 days ago. No further falls reported since then. -  Encourage safe home environment and fall prevention strategies.     Cc:Inc, [facility] [address] [place],  [place] [address]

## 2023-12-23 ENCOUNTER — Other Ambulatory Visit (HOSPITAL_COMMUNITY): Payer: Medicare (Managed Care)

## 2023-12-26 LAB — COLOGUARD

## 2023-12-28 ENCOUNTER — Other Ambulatory Visit: Payer: Self-pay

## 2023-12-28 DIAGNOSIS — Z1211 Encounter for screening for malignant neoplasm of colon: Secondary | ICD-10-CM

## 2024-01-03 ENCOUNTER — Other Ambulatory Visit (HOSPITAL_COMMUNITY): Payer: Medicare (Managed Care)

## 2024-01-10 LAB — COLOGUARD: COLOGUARD: NEGATIVE

## 2024-01-12 ENCOUNTER — Other Ambulatory Visit: Payer: Self-pay | Admitting: Internal Medicine

## 2024-01-12 ENCOUNTER — Ambulatory Visit (HOSPITAL_COMMUNITY)
Admission: RE | Admit: 2024-01-12 | Discharge: 2024-01-12 | Disposition: A | Discharge status: Home / Self Care | Payer: Medicare (Managed Care) | Source: Ambulatory Visit | Attending: Internal Medicine | Admitting: Internal Medicine

## 2024-01-12 DIAGNOSIS — R1310 Dysphagia, unspecified: Secondary | ICD-10-CM | POA: Diagnosis present

## 2024-01-12 DIAGNOSIS — K219 Gastro-esophageal reflux disease without esophagitis: Secondary | ICD-10-CM

## 2024-01-12 DIAGNOSIS — Z1211 Encounter for screening for malignant neoplasm of colon: Secondary | ICD-10-CM

## 2024-01-13 ENCOUNTER — Telehealth: Payer: Self-pay

## 2024-01-13 ENCOUNTER — Other Ambulatory Visit: Payer: Self-pay

## 2024-01-13 DIAGNOSIS — R1310 Dysphagia, unspecified: Secondary | ICD-10-CM

## 2024-01-13 NOTE — Telephone Encounter (Signed)
-----   Message from Carie Caddy Pyrtle sent at 01/13/2024  1:07 PM EST ----- Regarding: RE: Unable to perform esophagram Thank you. Bonita Quin, please proceed with modified barium esophagram for the patient, dysphagia, exclude aspiration JMP ----- Message ----- From: Villa Herb, PA-C Sent: 01/12/2024   9:55 AM EST To: Beverley Fiedler, MD Subject: Unable to perform esophagram                   Hi Dr. Rhea Belton,  I am one of the radiology PAs. I saw Ms. Mckercher today for an esophagram, she tells me her main complaint is coughing after eating - very rarely does she have dysphagia. We attempted multiple times to get her on the exam table however she is unable to pick her leg up to step on the small platform, stand independently or hold herself in a standing position with assistance. Per our protocol with her stated symptoms I need her to be standing in a right lateral position on the table to rule out aspiration. Because she cannot do that we did not proceed with the exam today.  She may benefit from a modified barium swallow first, which is performed entirely in a seated position, to at least rule out aspiration and then if there is no aspiration but continued concern for dysphagia we could consider a limited esophagram with the table tilted.  Thanks, Carollee Herter

## 2024-01-13 NOTE — Progress Notes (Signed)
 Brenda Murray

## 2024-01-13 NOTE — Telephone Encounter (Signed)
Order placed for modified barium swallow. Hospital to contact pt regarding appt for modified barium swallow.

## 2024-01-24 ENCOUNTER — Other Ambulatory Visit (HOSPITAL_COMMUNITY): Payer: Self-pay | Admitting: Internal Medicine

## 2024-01-24 DIAGNOSIS — R1310 Dysphagia, unspecified: Secondary | ICD-10-CM

## 2024-01-31 ENCOUNTER — Encounter (HOSPITAL_COMMUNITY): Payer: Medicare (Managed Care)

## 2024-02-02 ENCOUNTER — Ambulatory Visit (HOSPITAL_COMMUNITY)
Admission: RE | Admit: 2024-02-02 | Discharge: 2024-02-02 | Disposition: A | Discharge status: Home / Self Care | Payer: Medicare (Managed Care) | Source: Ambulatory Visit | Attending: *Deleted | Admitting: *Deleted

## 2024-02-02 ENCOUNTER — Ambulatory Visit (HOSPITAL_COMMUNITY)
Admission: RE | Admit: 2024-02-02 | Discharge: 2024-02-02 | Disposition: A | Discharge status: Home / Self Care | Payer: Medicare (Managed Care) | Source: Ambulatory Visit | Attending: Internal Medicine | Admitting: Internal Medicine

## 2024-02-02 DIAGNOSIS — R1310 Dysphagia, unspecified: Secondary | ICD-10-CM | POA: Diagnosis present

## 2024-02-08 ENCOUNTER — Other Ambulatory Visit: Payer: Self-pay | Admitting: *Deleted

## 2024-03-01 ENCOUNTER — Other Ambulatory Visit: Payer: Self-pay

## 2024-03-01 ENCOUNTER — Ambulatory Visit
Admission: RE | Admit: 2024-03-01 | Discharge: 2024-03-01 | Disposition: A | Discharge status: Home / Self Care | Payer: Medicare (Managed Care) | Source: Ambulatory Visit

## 2024-03-01 DIAGNOSIS — M79644 Pain in right finger(s): Secondary | ICD-10-CM

## 2024-03-01 DIAGNOSIS — M25512 Pain in left shoulder: Secondary | ICD-10-CM

## 2024-06-26 ENCOUNTER — Other Ambulatory Visit: Payer: Self-pay | Admitting: Nurse Practitioner

## 2024-06-26 DIAGNOSIS — Z1231 Encounter for screening mammogram for malignant neoplasm of breast: Secondary | ICD-10-CM

## 2024-06-29 ENCOUNTER — Ambulatory Visit
Admission: RE | Admit: 2024-06-29 | Discharge: 2024-06-29 | Disposition: A | Discharge status: Home / Self Care | Payer: Medicare (Managed Care) | Source: Ambulatory Visit | Attending: Nurse Practitioner | Admitting: Nurse Practitioner

## 2024-06-29 DIAGNOSIS — Z1231 Encounter for screening mammogram for malignant neoplasm of breast: Secondary | ICD-10-CM

## 2024-12-21 ENCOUNTER — Emergency Department (HOSPITAL_COMMUNITY)
Admission: EM | Admit: 2024-12-21 | Discharge: 2024-12-21 | Disposition: A | Discharge status: Home / Self Care | Payer: Medicare (Managed Care) | Attending: Emergency Medicine | Admitting: Emergency Medicine

## 2024-12-21 ENCOUNTER — Other Ambulatory Visit: Payer: Self-pay

## 2024-12-21 DIAGNOSIS — E039 Hypothyroidism, unspecified: Secondary | ICD-10-CM | POA: Diagnosis not present

## 2024-12-21 DIAGNOSIS — H6122 Impacted cerumen, left ear: Secondary | ICD-10-CM | POA: Diagnosis not present

## 2024-12-21 DIAGNOSIS — H9201 Otalgia, right ear: Secondary | ICD-10-CM | POA: Diagnosis present

## 2024-12-21 DIAGNOSIS — H7291 Unspecified perforation of tympanic membrane, right ear: Secondary | ICD-10-CM | POA: Diagnosis not present

## 2024-12-21 MED ORDER — CARBAMIDE PEROXIDE 6.5 % OT SOLN: 5.0000 [drp] | Freq: Two times a day (BID) | OTIC | 0 refills | Status: AC

## 2024-12-21 MED ORDER — CIPROFLOXACIN-DEXAMETHASONE 0.3-0.1 % OT SUSP: 4.0000 [drp] | Freq: Two times a day (BID) | OTIC | 0 refills | Status: AC

## 2024-12-21 NOTE — Discharge Instructions (Addendum)
 You were seen today for impacted earwax on left side and perforated tympanic membrane on right.  The perforation is likely secondary to the viral upper respiratory infection that you have noted you had had earlier.  Continue to use the eardrops I have provided to help you find any infection on that right ear.  Additionally have provided some eardrops for you to place in your left ear to help with earwax removal.  Continue to follow-up with PCP and ENT, present for patient provided this after visit summary.

## 2024-12-21 NOTE — ED Notes (Signed)
 Patient's son has agreed to pick her up instead of her going home by [facility].

## 2024-12-21 NOTE — ED Triage Notes (Signed)
 Pt BIB [facility] from home. Pt presents with a feeling of something crawling in her ear. Pt also c/o sinus pain, cough, and nasal congestion for the past 2 days. EMS noted pt to have cervical lymphadenopathy.   EMS Vitals  132/68 HR 84 RR 16 SpO2 96% on RA CBG 101
